# Patient Record
Sex: Male | Born: 1945
Health system: Southern US, Community
[De-identification: ages and names within clinical notes are randomized; demographics above are authoritative.]

## PROBLEM LIST (undated history)

## (undated) DIAGNOSIS — R609 Edema, unspecified: Secondary | ICD-10-CM

## (undated) DIAGNOSIS — I480 Paroxysmal atrial fibrillation: Secondary | ICD-10-CM

## (undated) DIAGNOSIS — N200 Calculus of kidney: Secondary | ICD-10-CM

## (undated) DIAGNOSIS — I10 Essential (primary) hypertension: Secondary | ICD-10-CM

## (undated) DIAGNOSIS — M109 Gout, unspecified: Secondary | ICD-10-CM

## (undated) DIAGNOSIS — N183 Chronic kidney disease, stage 3 unspecified: Secondary | ICD-10-CM

## (undated) DIAGNOSIS — Z7901 Long term (current) use of anticoagulants: Secondary | ICD-10-CM

## (undated) DIAGNOSIS — M1990 Unspecified osteoarthritis, unspecified site: Secondary | ICD-10-CM

## (undated) DIAGNOSIS — E785 Hyperlipidemia, unspecified: Secondary | ICD-10-CM

## (undated) DIAGNOSIS — E7800 Pure hypercholesterolemia, unspecified: Secondary | ICD-10-CM

## (undated) DIAGNOSIS — Z9289 Personal history of other medical treatment: Secondary | ICD-10-CM

## (undated) DIAGNOSIS — I482 Chronic atrial fibrillation, unspecified: Secondary | ICD-10-CM

## (undated) DIAGNOSIS — I872 Venous insufficiency (chronic) (peripheral): Secondary | ICD-10-CM

## (undated) DIAGNOSIS — D6869 Other thrombophilia: Secondary | ICD-10-CM

## (undated) DIAGNOSIS — E669 Obesity, unspecified: Secondary | ICD-10-CM

## (undated) HISTORY — DX: Edema, unspecified: R60.9

## (undated) HISTORY — DX: Unspecified osteoarthritis, unspecified site: M19.90

## (undated) HISTORY — DX: Personal history of other medical treatment: Z92.89

## (undated) HISTORY — DX: Essential (primary) hypertension: I10

## (undated) HISTORY — DX: Pure hypercholesterolemia, unspecified: E78.00

## (undated) HISTORY — DX: Long term (current) use of anticoagulants: Z79.01

## (undated) HISTORY — DX: Obesity, unspecified: E66.9

## (undated) HISTORY — DX: Venous insufficiency (chronic) (peripheral): I87.2

## (undated) HISTORY — DX: Paroxysmal atrial fibrillation: I48.0

## (undated) HISTORY — DX: Calculus of kidney: N20.0

## (undated) HISTORY — DX: Hyperlipidemia, unspecified: E78.5

## (undated) HISTORY — DX: Gout, unspecified: M10.9

## (undated) HISTORY — DX: Chronic kidney disease, stage 3 unspecified: N18.30

## (undated) HISTORY — PX: OTHER SURGICAL HISTORY: SHX169

## (undated) HISTORY — DX: Other thrombophilia: D68.69

## (undated) HISTORY — DX: Chronic atrial fibrillation, unspecified: I48.20

---

## 1997-10-25 ENCOUNTER — Ambulatory Visit (HOSPITAL_COMMUNITY): Admission: RE | Admit: 1997-10-25 | Discharge: 1997-10-25 | Payer: Self-pay | Admitting: Urology

## 1999-10-11 ENCOUNTER — Emergency Department (HOSPITAL_COMMUNITY): Admission: EM | Admit: 1999-10-11 | Discharge: 1999-10-11 | Payer: Self-pay | Admitting: Emergency Medicine

## 1999-10-12 ENCOUNTER — Encounter: Payer: Self-pay | Admitting: Emergency Medicine

## 1999-10-29 ENCOUNTER — Encounter: Admission: RE | Admit: 1999-10-29 | Discharge: 1999-10-29 | Payer: Self-pay | Admitting: Urology

## 1999-10-29 ENCOUNTER — Encounter: Payer: Self-pay | Admitting: Urology

## 1999-10-30 ENCOUNTER — Ambulatory Visit (HOSPITAL_COMMUNITY): Admission: RE | Admit: 1999-10-30 | Discharge: 1999-10-30 | Payer: Self-pay | Admitting: Urology

## 1999-10-30 ENCOUNTER — Encounter: Payer: Self-pay | Admitting: Urology

## 1999-11-14 ENCOUNTER — Ambulatory Visit (HOSPITAL_COMMUNITY): Admission: RE | Admit: 1999-11-14 | Discharge: 1999-11-14 | Payer: Self-pay | Admitting: Gastroenterology

## 2000-03-03 ENCOUNTER — Emergency Department (HOSPITAL_COMMUNITY): Admission: EM | Admit: 2000-03-03 | Discharge: 2000-03-03 | Payer: Self-pay | Admitting: Emergency Medicine

## 2000-03-03 ENCOUNTER — Encounter: Payer: Self-pay | Admitting: Emergency Medicine

## 2001-03-31 ENCOUNTER — Emergency Department (HOSPITAL_COMMUNITY): Admission: EM | Admit: 2001-03-31 | Discharge: 2001-03-31 | Payer: Self-pay | Admitting: Emergency Medicine

## 2004-12-22 ENCOUNTER — Emergency Department (HOSPITAL_COMMUNITY): Admission: EM | Admit: 2004-12-22 | Discharge: 2004-12-22 | Payer: Self-pay | Admitting: Emergency Medicine

## 2008-01-17 DIAGNOSIS — Z9289 Personal history of other medical treatment: Secondary | ICD-10-CM

## 2008-01-17 HISTORY — DX: Personal history of other medical treatment: Z92.89

## 2009-01-23 ENCOUNTER — Encounter (HOSPITAL_BASED_OUTPATIENT_CLINIC_OR_DEPARTMENT_OTHER): Admission: RE | Admit: 2009-01-23 | Discharge: 2009-03-26 | Payer: Self-pay | Admitting: General Surgery

## 2009-01-24 ENCOUNTER — Ambulatory Visit: Payer: Self-pay | Admitting: Vascular Surgery

## 2009-01-25 ENCOUNTER — Encounter (HOSPITAL_BASED_OUTPATIENT_CLINIC_OR_DEPARTMENT_OTHER): Payer: Self-pay | Admitting: General Surgery

## 2009-01-25 ENCOUNTER — Ambulatory Visit: Admission: RE | Admit: 2009-01-25 | Discharge: 2009-01-25 | Payer: Self-pay | Admitting: General Surgery

## 2009-03-27 ENCOUNTER — Encounter (HOSPITAL_BASED_OUTPATIENT_CLINIC_OR_DEPARTMENT_OTHER): Admission: RE | Admit: 2009-03-27 | Discharge: 2009-04-23 | Payer: Self-pay | Admitting: General Surgery

## 2009-11-16 ENCOUNTER — Ambulatory Visit: Payer: Self-pay | Admitting: Cardiology

## 2009-11-16 ENCOUNTER — Inpatient Hospital Stay (HOSPITAL_COMMUNITY): Admission: EM | Admit: 2009-11-16 | Discharge: 2009-11-18 | Payer: Self-pay | Admitting: Emergency Medicine

## 2009-11-17 ENCOUNTER — Encounter (INDEPENDENT_AMBULATORY_CARE_PROVIDER_SITE_OTHER): Payer: Self-pay | Admitting: Internal Medicine

## 2009-11-29 ENCOUNTER — Ambulatory Visit: Payer: Self-pay | Admitting: Cardiology

## 2010-03-15 ENCOUNTER — Ambulatory Visit: Payer: Self-pay | Admitting: Cardiology

## 2010-06-13 LAB — BASIC METABOLIC PANEL
BUN: 24 mg/dL — ABNORMAL HIGH (ref 6–23)
BUN: 32 mg/dL — ABNORMAL HIGH (ref 6–23)
BUN: 38 mg/dL — ABNORMAL HIGH (ref 6–23)
CO2: 22 mEq/L (ref 19–32)
Calcium: 8.6 mg/dL (ref 8.4–10.5)
Calcium: 9 mg/dL (ref 8.4–10.5)
Chloride: 107 mEq/L (ref 96–112)
Chloride: 110 mEq/L (ref 96–112)
Creatinine, Ser: 1.48 mg/dL (ref 0.4–1.5)
Creatinine, Ser: 1.55 mg/dL — ABNORMAL HIGH (ref 0.4–1.5)
Creatinine, Ser: 1.81 mg/dL — ABNORMAL HIGH (ref 0.4–1.5)
GFR calc Af Amer: 46 mL/min — ABNORMAL LOW (ref >60)
GFR calc Af Amer: 55 mL/min — ABNORMAL LOW (ref >60)
GFR calc Af Amer: 58 mL/min — ABNORMAL LOW (ref >60)
GFR calc non Af Amer: 38 mL/min — ABNORMAL LOW (ref >60)
GFR calc non Af Amer: 48 mL/min — ABNORMAL LOW (ref >60)
Glucose, Bld: 110 mg/dL — ABNORMAL HIGH (ref 70–99)
Potassium: 4.2 mEq/L (ref 3.5–5.1)
Sodium: 140 mEq/L (ref 135–145)

## 2010-06-13 LAB — D-DIMER, QUANTITATIVE: D-Dimer, Quant: 0.34 ug/mL-FEU (ref 0.00–0.48)

## 2010-06-13 LAB — POCT CARDIAC MARKERS
CKMB, poc: <1 ng/mL — ABNORMAL LOW (ref 1.0–8.0)
Myoglobin, poc: 167 ng/mL (ref 12–200)
Troponin i, poc: <0.05 ng/mL (ref 0.00–0.09)

## 2010-06-13 LAB — DIFFERENTIAL
Basophils Absolute: 0 10*3/uL (ref 0.0–0.1)
Basophils Relative: 1 % (ref 0–1)
Eosinophils Absolute: 0.1 K/uL (ref 0.0–0.7)
Eosinophils Relative: 2 % (ref 0–5)
Lymphocytes Relative: 25 % (ref 12–46)
Lymphs Abs: 2 K/uL (ref 0.7–4.0)
Monocytes Absolute: 0.9 K/uL (ref 0.1–1.0)
Monocytes Relative: 11 % (ref 3–12)
Neutro Abs: 4.9 K/uL (ref 1.7–7.7)
Neutrophils Relative %: 61 % (ref 43–77)

## 2010-06-13 LAB — CBC
HCT: 37.9 % — ABNORMAL LOW (ref 39.0–52.0)
HCT: 38 % — ABNORMAL LOW (ref 39.0–52.0)
Hemoglobin: 12.7 g/dL — ABNORMAL LOW (ref 13.0–17.0)
Hemoglobin: 12.7 g/dL — ABNORMAL LOW (ref 13.0–17.0)
MCH: 31.4 pg (ref 26.0–34.0)
MCHC: 33.4 g/dL (ref 30.0–36.0)
MCV: 93.8 fL (ref 78.0–100.0)
MCV: 95.2 fL (ref 78.0–100.0)
Platelets: 277 10*3/uL (ref 150–400)
Platelets: 290 10*3/uL (ref 150–400)
Platelets: 291 10*3/uL (ref 150–400)
RBC: 3.78 MIL/uL — ABNORMAL LOW (ref 4.22–5.81)
RBC: 3.94 MIL/uL — ABNORMAL LOW (ref 4.22–5.81)
RBC: 3.99 MIL/uL — ABNORMAL LOW (ref 4.22–5.81)
RBC: 4.05 MIL/uL — ABNORMAL LOW (ref 4.22–5.81)
RDW: 13 % (ref 11.5–15.5)
RDW: 13 % (ref 11.5–15.5)
RDW: 13.3 % (ref 11.5–15.5)
WBC: 10.5 10*3/uL (ref 4.0–10.5)
WBC: 8 10*3/uL (ref 4.0–10.5)
WBC: 8.4 10*3/uL (ref 4.0–10.5)
WBC: 9.4 10*3/uL (ref 4.0–10.5)

## 2010-06-13 LAB — LIPID PANEL
LDL Cholesterol: 96 mg/dL (ref 0–99)
Triglycerides: 106 mg/dL (ref <150)

## 2010-06-13 LAB — TROPONIN I: Troponin I: 0.01 ng/mL (ref 0.00–0.06)

## 2010-06-13 LAB — TSH: TSH: 0.415 u[IU]/mL (ref 0.350–4.500)

## 2010-06-13 LAB — PROTIME-INR
INR: 1.01 (ref 0.00–1.49)
INR: 1.05 (ref 0.00–1.49)
INR: 1.11 (ref 0.00–1.49)
Prothrombin Time: 13.5 s (ref 11.6–15.2)
Prothrombin Time: 14.5 seconds (ref 11.6–15.2)

## 2010-06-13 LAB — CK TOTAL AND CKMB (NOT AT ARMC)
CK, MB: 0.8 ng/mL (ref 0.3–4.0)
Relative Index: INVALID (ref 0.0–2.5)
Total CK: 86 U/L (ref 7–232)

## 2010-06-13 LAB — CARDIAC PANEL(CRET KIN+CKTOT+MB+TROPI)
CK, MB: 1.2 ng/mL (ref 0.3–4.0)
CK, MB: 1.6 ng/mL (ref 0.3–4.0)
Relative Index: INVALID (ref 0.0–2.5)
Total CK: 65 U/L (ref 7–232)
Troponin I: 0.01 ng/mL (ref 0.00–0.06)

## 2010-06-13 LAB — GLUCOSE, CAPILLARY: Glucose-Capillary: 94 mg/dL (ref 70–99)

## 2010-06-13 LAB — HEPARIN LEVEL (UNFRACTIONATED)
Heparin Unfractionated: 0.42 IU/mL (ref 0.30–0.70)
Heparin Unfractionated: 0.6 IU/mL (ref 0.30–0.70)

## 2010-06-13 LAB — APTT: aPTT: 29 seconds (ref 24–37)

## 2010-06-13 LAB — BRAIN NATRIURETIC PEPTIDE: Pro B Natriuretic peptide (BNP): 70 pg/mL (ref 0.0–100.0)

## 2010-06-13 LAB — MAGNESIUM
Magnesium: 2 mg/dL (ref 1.5–2.5)
Magnesium: 2.1 mg/dL (ref 1.5–2.5)

## 2010-06-26 ENCOUNTER — Encounter: Payer: Self-pay | Admitting: Cardiology

## 2010-07-30 ENCOUNTER — Other Ambulatory Visit: Payer: Self-pay | Admitting: *Deleted

## 2010-07-30 DIAGNOSIS — E7800 Pure hypercholesterolemia, unspecified: Secondary | ICD-10-CM

## 2010-08-16 NOTE — Op Note (Signed)
[facility]  Patient:    Bradley Stokes, Bradley Stokes                      MRN: [id number] Proc. Date: 10/30/99 Adm. Date:  [id number] Disc. Date: [id number] Attending:  Georgina Pillion                           Operative Report  PREOPERATIVE DIAGNOSIS:  Left ureteral calculus.  POSTOPERATIVE DIAGNOSIS:  Left ureteral calculus.  OPERATION PERFORMED:  Cystoscopy, left retrograde pyelogram, ureteroscopy, basket extraction of stone and Double J stent placement.  SURGEON:  Rana Snare, M.D.  ANESTHESIA:  General with LMA.  BRIEF HISTORY:  Mr. Hlavka is a 65 year old male.  He was diagnosed with a 5 mm distal left ureteral stone and moderate hydronephrosis.  He has failed to pass the stone spontaneously for three weeks and now presents for definitive management.  All risks and benefits of the surgery were discussed with the patient at length and full informed consent was obtained.  DESCRIPTION OF PROCEDURE:  The patient was brought to the operating room where he had the successful induction of general anesthesia.  He was placed in the lithotomy position, prepped and draped in the usual manner.  Cystoscopy revealed an unremarkable anterior urethra and prostatic urethra.  The bladder itself was endoscopically normal.  A retrograde pyelogram confirmed the presence of a filling defect in the distal left ureter.  The guidewire was placed to the renal pelvis with fluoroscopy.  A 6.5 French ureteroscope was then inserted without difficulty in the distal ureter.  There was some mucosal edema and slight narrowing of the ureter just below the stone.  A 5-6 mm stone was encountered.  It was basket extracted, but broke into two pieces.  Each piece was separately basket extracted.  Over the guidewire, a 26 cm, 6-French Double J stent was placed.  A dangle string was left on which was secured to the patients penis and lidocaine jelly was instilled.  The patient appeared to  tolerate the procedure well with no obvious complications. DD:  10/30/99 TD:  10/31/99 Job: [id number]

## 2010-08-16 NOTE — Procedures (Signed)
[facility]  Patient:    Bradley Stokes, Bradley Stokes                      MRN: [id number] Proc. Date: 11/14/99 Adm. Date:  [id number] Attending:  [name] CC:         Guinevere Ferrari, M.D. [facility]   Procedure Report  DATE OF BIRTH:  Sep 03, 1945  REFERRING PHYSICIAN:  Guinevere Ferrari, M.D. [facility].  PROCEDURE PERFORMED:  Flexible sigmoidoscopy.  ENDOSCOPIST:  Nelwyn Salisbury, M.D.  INSTRUMENT USED:  Olympus video colonoscope.  INDICATIONS FOR PROCEDURE:  Screening flexible sigmoidoscopy being done in a 64 year old white male, rule out colonic polyps, masses, hemorrhoids, etc.  PREPROCEDURE PREPARATION:  Informed consent was obtained from the patient. The patient was fasted for eight hours prior to the procedure and prepped with two Fleets enemas the morning of the procedure.  PREPROCEDURE PHYSICAL EXAMINATION:  VITAL SIGNS:  Stable.  NECK:  Supple.  CHEST:  Clear to auscultation.  S1 and S2 regular.  ABDOMEN:  Soft with normal abdominal bowel sounds.  DESCRIPTION OF PROCEDURE:  The patient was placed in the left lateral decubitus position and sedated with 50 mg of Demerol and 4 mg of Versed intravenously.  Once the patient was adequately sedated and maintained on low flow oxygen and continuous cardiac monitoring, the Olympus video colonoscope was advanced from the rectum to about 50 cm with extreme difficulty secondary to a large amount of residual stool in the colon.  The patient had extensive left-sided diverticular disease with inspissated stool in several of the diverticular pockets.  The patient had small non-bleeding internal hemorrhoids on retroflexion of the rectum.  The scope could not be advanced beyond 50 cm because of an inadequate prep.  The patient tolerated the procedure well without complication.  No masses or polyps were seen.  IMPRESSION: 1. Significant left-sided diverticulosis. 2. Small non-bleeding internal  hemorrhoid. 3. No masses or polyps seen. 4. Large amount of residual stool in the left colon.  RECOMMENDATIONS: 1. The patient has been advised to increase the fluid and fiber in his diet,    and to avoid nuts, seeds, and popcorn. 2. Follow up is advised in the office on a p.r.n. basis. DD:  11/14/99 TD:  11/14/99 Job: [id number]

## 2010-08-21 ENCOUNTER — Encounter: Payer: Self-pay | Admitting: Cardiology

## 2010-08-21 ENCOUNTER — Encounter: Payer: Self-pay | Admitting: *Deleted

## 2010-08-21 ENCOUNTER — Encounter: Payer: Self-pay | Admitting: Family Medicine

## 2010-08-21 DIAGNOSIS — I480 Paroxysmal atrial fibrillation: Secondary | ICD-10-CM | POA: Insufficient documentation

## 2010-08-21 DIAGNOSIS — E785 Hyperlipidemia, unspecified: Secondary | ICD-10-CM | POA: Insufficient documentation

## 2010-08-21 DIAGNOSIS — Z7901 Long term (current) use of anticoagulants: Secondary | ICD-10-CM | POA: Insufficient documentation

## 2010-08-21 DIAGNOSIS — I10 Essential (primary) hypertension: Secondary | ICD-10-CM | POA: Insufficient documentation

## 2010-08-21 DIAGNOSIS — R609 Edema, unspecified: Secondary | ICD-10-CM | POA: Insufficient documentation

## 2010-08-22 ENCOUNTER — Encounter: Payer: Self-pay | Admitting: Cardiology

## 2010-08-22 ENCOUNTER — Other Ambulatory Visit: Payer: Self-pay | Admitting: *Deleted

## 2010-08-22 ENCOUNTER — Ambulatory Visit (INDEPENDENT_AMBULATORY_CARE_PROVIDER_SITE_OTHER): Payer: Medicare HMO | Admitting: Cardiology

## 2010-08-22 ENCOUNTER — Other Ambulatory Visit (INDEPENDENT_AMBULATORY_CARE_PROVIDER_SITE_OTHER): Payer: Medicare HMO | Admitting: *Deleted

## 2010-08-22 VITALS — BP 140/80 | HR 57 | Wt 281.0 lb

## 2010-08-22 DIAGNOSIS — Z79899 Other long term (current) drug therapy: Secondary | ICD-10-CM

## 2010-08-22 DIAGNOSIS — R609 Edema, unspecified: Secondary | ICD-10-CM

## 2010-08-22 DIAGNOSIS — E7800 Pure hypercholesterolemia, unspecified: Secondary | ICD-10-CM

## 2010-08-22 DIAGNOSIS — I4891 Unspecified atrial fibrillation: Secondary | ICD-10-CM

## 2010-08-22 DIAGNOSIS — I480 Paroxysmal atrial fibrillation: Secondary | ICD-10-CM

## 2010-08-22 LAB — CBC WITH DIFFERENTIAL/PLATELET
Eosinophils Absolute: 0.1 10*3/uL (ref 0.0–0.7)
Eosinophils Relative: 1.6 % (ref 0.0–5.0)
Lymphocytes Relative: 22.2 % (ref 12.0–46.0)
MCV: 95.1 fl (ref 78.0–100.0)
Monocytes Absolute: 0.8 10*3/uL (ref 0.1–1.0)
Neutrophils Relative %: 66 % (ref 43.0–77.0)
Platelets: 251 10*3/uL (ref 150.0–400.0)
WBC: 8.1 10*3/uL (ref 4.5–10.5)

## 2010-08-22 LAB — HEPATIC FUNCTION PANEL
ALT: 8 U/L (ref 0–53)
AST: 15 U/L (ref 0–37)
Total Bilirubin: 1.3 mg/dL — ABNORMAL HIGH (ref 0.3–1.2)
Total Protein: 7 g/dL (ref 6.0–8.3)

## 2010-08-22 LAB — BASIC METABOLIC PANEL
BUN: 27 mg/dL — ABNORMAL HIGH (ref 6–23)
CO2: 33 mEq/L — ABNORMAL HIGH (ref 19–32)
Chloride: 100 mEq/L (ref 96–112)
Glucose, Bld: 94 mg/dL (ref 70–99)
Potassium: 4.3 mEq/L (ref 3.5–5.1)

## 2010-08-22 LAB — TSH: TSH: 0.5 u[IU]/mL (ref 0.35–5.50)

## 2010-08-22 MED ORDER — FUROSEMIDE 20 MG PO TABS: 40.0000 mg | ORAL_TABLET | Freq: Two times a day (BID) | ORAL | Status: DC

## 2010-08-22 NOTE — Assessment & Plan Note (Signed)
The patient has a history of chronic bilateral edema.  The edema has been minimized by wearing support bruits.  He has not been having any calf tenderness or evidence of phlebitis.

## 2010-08-22 NOTE — Assessment & Plan Note (Signed)
The patient has a past history of paroxysmal atrial fibrillation.  He is on low dose amiodarone.  He has been maintaining normal sinus rhythm.  He has not been aware of any palpitations or tachycardia.  He has not been experiencing any change in exercise tolerance.  He denies any recent chest pain or increased shortness of breath.  He does have chronic edema and wears support hose.

## 2010-08-22 NOTE — Progress Notes (Signed)
Bradley Stokes Date of Birth:  [birth year] [facility]: [name] / [facility] [id number] [address]. [address]   [place], [place]  [address] [phone]           Fax   [phone]  History of Present Illness: This pleasant 65 year old gentleman is seen for a four-month followup office visit.  He has a history of paroxysmal atrial fibrillation and a history of essential hypertension.  He does not have any history of ischemic heart disease.  He had a normal two-day adenosine Cardiolite stress test 01/17/08.  He was last hospitalized at [facility] on 11/16/09.  On that admission he converted on medication and did not require electrical cardioversion.  Current Outpatient Prescriptions  Medication Sig Dispense Refill  . amiodarone (PACERONE) 100 MG tablet Take 100 mg by mouth daily.        Marland Kitchen aspirin 81 MG tablet Take 81 mg by mouth daily.        . furosemide (LASIX) 20 MG tablet Take 2 tablets (40 mg total) by mouth 2 (two) times daily.  120 tablet  12  . lisinopril (PRINIVIL,ZESTRIL) 10 MG tablet Take 20 mg by mouth daily.       . metoprolol tartrate (LOPRESSOR) 25 MG tablet Take 12.5 mg by mouth 2 (two) times daily.        Marland Kitchen warfarin (COUMADIN) 5 MG tablet Take 5 mg by mouth as directed. Followed by Dr.Rankin      . DISCONTD: furosemide (LASIX) 20 MG tablet Take 40 mg by mouth 2 (two) times daily.         No Known Allergies  Patient Active Problem List  Diagnoses  . PAF (paroxysmal atrial fibrillation)  . Edema  . Long-term (current) use of anticoagulants  . Hyperlipidemia  . Hypertension    History  Smoking status  . Never Smoker   Smokeless tobacco  . Never Used    History  Alcohol Use No    Family History  Problem Relation Age of Onset  . Pneumonia Mother   . Heart failure Mother   . Kidney failure Sister   . Heart disease Brother   . Crohn's disease Sister     Review of Systems: Constitutional: no fever chills diaphoresis or fatigue or change in weight.    Head and neck: no hearing loss, no epistaxis, no photophobia or visual disturbance. Respiratory: No cough, shortness of breath or wheezing. Cardiovascular: No chest pain Or palpitations. Gastrointestinal: No abdominal distention, no abdominal pain, no change in bowel habits hematochezia or melena. Genitourinary: No dysuria, no frequency, no urgency, no nocturia. Musculoskeletal:No arthralgias, no back pain, no gait disturbance or myalgias. Neurological: No dizziness, no headaches, no numbness, no seizures, no syncope, no weakness, no tremors. Hematologic: No lymphadenopathy, no easy bruising. Psychiatric: No confusion, no hallucinations, no sleep disturbance.    Physical Exam: Filed Vitals:   08/22/10 1114  BP: 140/80  Pulse: 57  The general appearance is a well-developed large gentleman who is in no distress.  His weight has increased 14 pounds since we last saw him.  Pupils equal and reactive.   Extraocular Movements are full.  There is no scleral icterus.  The mouth and pharynx are normal.  The neck is supple.  The carotids reveal no bruits.  The jugular venous pressure is normal.  The thyroid is not enlarged.  There is no lymphadenopathy.The chest is clear to percussion and auscultation. There are no rales or rhonchi. Expansion of the chest is symmetrical.The  precordium is quiet.  The first heart sound is normal.  The second heart sound is physiologically split.  There is no murmur gallop rub or click.  There is no abnormal lift or heave.The abdomen is soft and nontender. Bowel sounds are normal. The liver and spleen are not enlarged. There Are no abdominal masses. There are no bruits.  Extremities reveal mild bilateral edema and he is wearing support hose.Strength is normal and symmetrical in all extremities.  There is no lateralizing weakness.  There are no sensory deficits.  Musculoskeletal reveals that he has a lot of arthritis and walks with a cane.  EKG today shows sinus bradycardia  and no ischemic changes.  Intervals are normal.   Assessment / Plan:  The patient is to continue same medication.  He needs to work harder on diet and weight loss.  Recheck in 6 months for followup office visit and EKG.

## 2010-08-23 ENCOUNTER — Telehealth: Payer: Self-pay | Admitting: *Deleted

## 2010-08-23 NOTE — Telephone Encounter (Signed)
Adv. Patient of lab results

## 2010-09-23 ENCOUNTER — Other Ambulatory Visit: Payer: Self-pay | Admitting: Cardiology

## 2010-09-23 DIAGNOSIS — I4891 Unspecified atrial fibrillation: Secondary | ICD-10-CM

## 2010-09-24 MED ORDER — AMIODARONE HCL 200 MG PO TABS: ORAL_TABLET | ORAL | Status: DC

## 2010-09-24 NOTE — Telephone Encounter (Signed)
Spoke with patient and he has been taking amiodarone 200 mg 1/2 daily for quite a while, what paper chart had indicated.  He wanted to continue taking 1/2 daily instead of decreasing pill size to 100 mg for now.

## 2010-10-09 ENCOUNTER — Emergency Department (HOSPITAL_COMMUNITY)
Admission: EM | Admit: 2010-10-09 | Discharge: 2010-10-09 | Disposition: A | Discharge status: Home / Self Care | Payer: Medicare HMO | Attending: Emergency Medicine | Admitting: Emergency Medicine

## 2010-10-09 DIAGNOSIS — I10 Essential (primary) hypertension: Secondary | ICD-10-CM | POA: Insufficient documentation

## 2010-10-09 DIAGNOSIS — K625 Hemorrhage of anus and rectum: Secondary | ICD-10-CM | POA: Insufficient documentation

## 2010-10-09 DIAGNOSIS — Z87442 Personal history of urinary calculi: Secondary | ICD-10-CM | POA: Insufficient documentation

## 2010-10-09 DIAGNOSIS — Z7901 Long term (current) use of anticoagulants: Secondary | ICD-10-CM | POA: Insufficient documentation

## 2010-10-09 DIAGNOSIS — I4891 Unspecified atrial fibrillation: Secondary | ICD-10-CM | POA: Insufficient documentation

## 2010-10-09 LAB — BASIC METABOLIC PANEL
GFR calc Af Amer: 51 mL/min — ABNORMAL LOW (ref >60)
GFR calc non Af Amer: 42 mL/min — ABNORMAL LOW (ref >60)
Potassium: 4.1 mEq/L (ref 3.5–5.1)
Sodium: 142 mEq/L (ref 135–145)

## 2010-10-09 LAB — CBC
MCHC: 32 g/dL (ref 30.0–36.0)
Platelets: 254 10*3/uL (ref 150–400)
RDW: 13.1 % (ref 11.5–15.5)

## 2010-10-09 LAB — OCCULT BLOOD, POC DEVICE: Fecal Occult Bld: POSITIVE

## 2010-10-09 LAB — PROTIME-INR: INR: 2.11 — ABNORMAL HIGH (ref 0.00–1.49)

## 2010-11-20 ENCOUNTER — Other Ambulatory Visit: Payer: Self-pay | Admitting: Cardiology

## 2010-11-20 DIAGNOSIS — I119 Hypertensive heart disease without heart failure: Secondary | ICD-10-CM

## 2010-11-20 NOTE — Telephone Encounter (Signed)
escribe request  

## 2010-12-20 ENCOUNTER — Other Ambulatory Visit: Payer: Self-pay | Admitting: Cardiology

## 2010-12-20 DIAGNOSIS — I4891 Unspecified atrial fibrillation: Secondary | ICD-10-CM

## 2010-12-23 NOTE — Telephone Encounter (Signed)
escribe request  

## 2011-03-06 ENCOUNTER — Encounter: Payer: Self-pay | Admitting: Cardiology

## 2011-03-06 ENCOUNTER — Ambulatory Visit (INDEPENDENT_AMBULATORY_CARE_PROVIDER_SITE_OTHER): Payer: Medicare HMO | Admitting: Cardiology

## 2011-03-06 VITALS — BP 148/80 | HR 64 | Ht 69.0 in | Wt 291.0 lb

## 2011-03-06 DIAGNOSIS — I480 Paroxysmal atrial fibrillation: Secondary | ICD-10-CM

## 2011-03-06 DIAGNOSIS — I10 Essential (primary) hypertension: Secondary | ICD-10-CM

## 2011-03-06 DIAGNOSIS — R609 Edema, unspecified: Secondary | ICD-10-CM

## 2011-03-06 DIAGNOSIS — I119 Hypertensive heart disease without heart failure: Secondary | ICD-10-CM

## 2011-03-06 DIAGNOSIS — E669 Obesity, unspecified: Secondary | ICD-10-CM

## 2011-03-06 DIAGNOSIS — I4891 Unspecified atrial fibrillation: Secondary | ICD-10-CM

## 2011-03-06 DIAGNOSIS — E7800 Pure hypercholesterolemia, unspecified: Secondary | ICD-10-CM

## 2011-03-06 NOTE — Assessment & Plan Note (Addendum)
The patient has chronic peripheral edema which is still present despite being on diuretic.  His weight is up 10 pounds since last visit

## 2011-03-06 NOTE — Progress Notes (Signed)
Bradley Stokes Date of Birth:  [birth year] [facility] / [facility] [id number] [address]   [place], [place]  [address] [phone]           Fax   [phone]  History of Present Illness: This pleasant 65 year old gentleman is seen for a month followup office visit.  He has a history of paroxysmal atrial fibrillation and a history of essential hypertension.  He does not have any history of ischemic heart disease.  He had a normal 2 day adenosine Cardiolite stress test in October 2009.  He was hospitalized for rapid atrial fibrillation on 11/16/09 and that admission he converted medically and did not require electrical cardioversion.  He was placed on low-dose amiodarone which he has tolerated well.  Current Outpatient Prescriptions  Medication Sig Dispense Refill  . amiodarone (PACERONE) 200 MG tablet 1/2 daily  30 tablet  11  . aspirin 81 MG tablet Take 81 mg by mouth daily.        . furosemide (LASIX) 20 MG tablet Take 2 tablets (40 mg total) by mouth 2 (two) times daily.  120 tablet  12  . lisinopril (PRINIVIL,ZESTRIL) 10 MG tablet Take 20 mg by mouth daily.       . metoprolol tartrate (LOPRESSOR) 25 MG tablet TAKE 1/2 TABLET BY MOUTH TWICE DAILY  30 tablet  11  . warfarin (COUMADIN) 5 MG tablet Take 5 mg by mouth as directed. Followed by Dr.Rankin      . atorvastatin (LIPITOR) 20 MG tablet Take 20 mg by mouth Daily.        No Known Allergies  Patient Active Problem List  Diagnoses  . PAF (paroxysmal atrial fibrillation)  . Edema  . Long-term (current) use of anticoagulants  . Hyperlipidemia  . Hypertension    History  Smoking status  . Never Smoker   Smokeless tobacco  . Never Used    History  Alcohol Use No    Family History  Problem Relation Age of Onset  . Pneumonia Mother   . Heart failure Mother   . Kidney failure Sister   . Heart disease Brother   . Crohn's disease Sister     Review of Systems: Constitutional: no fever chills  diaphoresis or fatigue or change in weight.  Head and neck: no hearing loss, no epistaxis, no photophobia or visual disturbance. Respiratory: No cough, shortness of breath or wheezing. Cardiovascular: No chest pain peripheral edema, palpitations. Gastrointestinal: No abdominal distention, no abdominal pain, no change in bowel habits hematochezia or melena. Genitourinary: No dysuria, no frequency, no urgency, no nocturia. Musculoskeletal:No arthralgias, no back pain, no gait disturbance or myalgias. Neurological: No dizziness, no headaches, no numbness, no seizures, no syncope, no weakness, no tremors. Hematologic: No lymphadenopathy, no easy bruising. Psychiatric: No confusion, no hallucinations, no sleep disturbance.    Physical Exam: Filed Vitals:   03/06/11 1441  BP: 148/80  Pulse: 64   the general appearance reveals a large gentleman in no acute distress.Pupils equal and reactive.   Extraocular Movements are full.  There is no scleral icterus.  The mouth and pharynx are normal.  The neck is supple.  The carotids reveal no bruits.  The jugular venous pressure is normal.  The thyroid is not enlarged.  There is no lymphadenopathy.  The chest is clear to percussion and auscultation. There are no rales or rhonchi. Expansion of the chest is symmetrical.  The precordium is quiet.  The first heart sound is normal.  The  second heart sound is physiologically split.  There is no murmur gallop rub or click.  There is no abnormal lift or heave.  The abdomen is soft and nontender. Bowel sounds are normal. The liver and spleen are not enlarged. There Are no abdominal masses. There are no bruits.  GEN Ms. reveal 2+ pretibial edema bilaterally.Strength is normal and symmetrical in all extremities.  There is no lateralizing weakness.  There are no sensory deficits.  The skin is warm and dry.  There is no rash.    Assessment / Plan:  Continue same medication.  Work harder at weight loss.  Recheck  in 6 months for office visit and blood work to followup on amiodarone.

## 2011-03-06 NOTE — Assessment & Plan Note (Signed)
The patient is not having any headaches or dizzy spells.  Is not having any symptoms of congestive heart failure.  He has not been as careful with his diet and his weight is up 10 pounds since we last saw him.

## 2011-03-06 NOTE — Assessment & Plan Note (Signed)
Patient denies any recurrent episodes of tachycardia palpitations or atrial fibrillation since his last hospitalization.  He is on long-term Coumadin which was monitored by Dr. Zadie Rhine at [facility]

## 2011-03-06 NOTE — Patient Instructions (Signed)
Your physician recommends that you continue on your current medications as directed. Please refer to the Current Medication list given to you today.  Your physician wants you to follow-up in: 6 months. You will receive a reminder letter in the mail two months in advance. If you don't receive a letter, please call our office to schedule the follow-up appointment.  

## 2011-08-14 ENCOUNTER — Other Ambulatory Visit: Payer: Self-pay | Admitting: Cardiology

## 2011-09-05 ENCOUNTER — Encounter: Payer: Self-pay | Admitting: Cardiology

## 2011-09-05 ENCOUNTER — Ambulatory Visit (INDEPENDENT_AMBULATORY_CARE_PROVIDER_SITE_OTHER): Payer: Medicare HMO | Admitting: Cardiology

## 2011-09-05 VITALS — BP 124/82 | HR 60 | Ht 69.0 in | Wt 296.0 lb

## 2011-09-05 DIAGNOSIS — R609 Edema, unspecified: Secondary | ICD-10-CM

## 2011-09-05 DIAGNOSIS — E7800 Pure hypercholesterolemia, unspecified: Secondary | ICD-10-CM

## 2011-09-05 DIAGNOSIS — I10 Essential (primary) hypertension: Secondary | ICD-10-CM

## 2011-09-05 DIAGNOSIS — I480 Paroxysmal atrial fibrillation: Secondary | ICD-10-CM

## 2011-09-05 DIAGNOSIS — I4891 Unspecified atrial fibrillation: Secondary | ICD-10-CM

## 2011-09-05 DIAGNOSIS — Z79899 Other long term (current) drug therapy: Secondary | ICD-10-CM

## 2011-09-05 DIAGNOSIS — Z7901 Long term (current) use of anticoagulants: Secondary | ICD-10-CM

## 2011-09-05 DIAGNOSIS — I119 Hypertensive heart disease without heart failure: Secondary | ICD-10-CM

## 2011-09-05 LAB — TSH: TSH: 0.57 u[IU]/mL (ref 0.35–5.50)

## 2011-09-05 LAB — CBC WITH DIFFERENTIAL/PLATELET
Basophils Absolute: 0 10*3/uL (ref 0.0–0.1)
Lymphocytes Relative: 16.2 % (ref 12.0–46.0)
Monocytes Relative: 8.7 % (ref 3.0–12.0)
Neutrophils Relative %: 72.7 % (ref 43.0–77.0)
Platelets: 262 10*3/uL (ref 150.0–400.0)
RDW: 14.3 % (ref 11.5–14.6)

## 2011-09-05 LAB — HEPATIC FUNCTION PANEL
ALT: 6 U/L (ref 0–53)
AST: 14 U/L (ref 0–37)
Albumin: 3.7 g/dL (ref 3.5–5.2)
Alkaline Phosphatase: 114 U/L (ref 39–117)
Total Protein: 7.3 g/dL (ref 6.0–8.3)

## 2011-09-05 NOTE — Assessment & Plan Note (Signed)
The patient is on long-term Coumadin and his protimes are followed at [facility]

## 2011-09-05 NOTE — Assessment & Plan Note (Signed)
His lower extremity edema has worsened.  He now has an area where the serum is bruising from cracks in the skin on his left lower leg.  He has an appointment to be seen at the wound center.

## 2011-09-05 NOTE — Assessment & Plan Note (Signed)
Patient has had no recurrent atrial fibrillation.  He is on amiodarone 100 mg daily.  We are checking blood work today to followup on his chronic amiodarone including a CBC, thyroid function panel and hepatic function panel.

## 2011-09-05 NOTE — Assessment & Plan Note (Signed)
The patient is not having any symptoms of increasing dyspnea.  No angina pectoris.  No dizziness or syncope.  His edema has been worse but this may be due at least in part to local factors of venous insufficiency

## 2011-09-05 NOTE — Patient Instructions (Signed)
Will obtain labs today and call you with the results  Your physician recommends that you continue on your current medications as directed. Please refer to the Current Medication list given to you today.  Your physician wants you to follow-up in: 6 months You will receive a reminder letter in the mail two months in advance. If you don't receive a letter, please call our office to schedule the follow-up appointment.   

## 2011-09-05 NOTE — Progress Notes (Signed)
Bradley Stokes Date of Birth:  12/06/1945 [facility] [address] [place],   [address] [phone]         Fax   [phone]  History of Present Illness: This pleasant 66 year old gentleman is seen for a scheduled followup office visit.  He has a past history of paroxysmal relation.  He is remaining in normal sinus rhythm on low-dose amiodarone.  He has a history of essential hypertension and a recent history of gout and is now on allopurinol.  He's been having worsening peripheral edema despite increased diuretic therapy and his primary care provider Dr. Zadie Rhine has arranged for him to be seen at the wound center Current Outpatient Prescriptions  Medication Sig Dispense Refill  . acetaminophen (TYLENOL) 325 MG tablet Take 325 mg by mouth every 6 (six) hours as needed.      Marland Kitchen allopurinol (ZYLOPRIM) 100 MG tablet Take 100 mg by mouth daily.      Marland Kitchen amiodarone (PACERONE) 200 MG tablet 1/2 daily  30 tablet  11  . aspirin 81 MG tablet Take 81 mg by mouth daily.        Marland Kitchen atorvastatin (LIPITOR) 20 MG tablet Take 20 mg by mouth Daily.      . cephALEXin (KEFLEX) 500 MG capsule as directed.      . furosemide (LASIX) 20 MG tablet TAKE 2 TABLETS BY MOUTH TWICE DAILY  120 tablet  5  . lisinopril (PRINIVIL,ZESTRIL) 10 MG tablet Take 20 mg by mouth daily.       . metoprolol tartrate (LOPRESSOR) 25 MG tablet TAKE 1/2 TABLET BY MOUTH TWICE DAILY  30 tablet  11  . warfarin (COUMADIN) 5 MG tablet Take 5 mg by mouth as directed. Followed by Dr.Rankin        Allergies  Allergen Reactions  . Other     Tape,needs paper tape    Patient Active Problem List  Diagnoses  . PAF (paroxysmal atrial fibrillation)  . Edema  . Long-term (current) use of anticoagulants  . Hyperlipidemia  . Hypertension  . Long term use of drug    History  Smoking status  . Never Smoker   Smokeless tobacco  . Never Used    History  Alcohol Use No    Family History  Problem Relation  Age of Onset  . Pneumonia Mother   . Heart failure Mother   . Kidney failure Sister   . Heart disease Brother   . Crohn's disease Sister     Review of Systems: Constitutional: no fever chills diaphoresis or fatigue or change in weight.  Head and neck: no hearing loss, no epistaxis, no photophobia or visual disturbance. Respiratory: No cough, shortness of breath or wheezing. Cardiovascular: No chest pain peripheral edema, palpitations. Gastrointestinal: No abdominal distention, no abdominal pain, no change in bowel habits hematochezia or melena. Genitourinary: No dysuria, no frequency, no urgency, no nocturia. Musculoskeletal:No arthralgias, no back pain, no gait disturbance or myalgias. Neurological: No dizziness, no headaches, no numbness, no seizures, no syncope, no weakness, no tremors. Hematologic: No lymphadenopathy, no easy bruising. Psychiatric: No confusion, no hallucinations, no sleep disturbance.    Physical Exam: Filed Vitals:   09/05/11 0918  BP: 124/82  Pulse: 60   the general appearance reveals a large gentleman in no distress.The head and neck exam reveals pupils equal and reactive.  Extraocular movements are full.  There is no scleral icterus.  The mouth and pharynx are normal.  The neck is supple.  The  carotids reveal no bruits.  The jugular venous pressure is normal.  The  thyroid is not enlarged.  There is no lymphadenopathy.  The chest is clear to percussion and auscultation.  There are no rales or rhonchi.  Expansion of the chest is symmetrical.  The precordium is quiet.  The first heart sound is normal.  The second heart sound is physiologically split.  There is no murmur gallop rub or click.  There is no abnormal lift or heave.  The abdomen is soft and nontender.  The bowel sounds are normal.  The liver and spleen are not enlarged.  There are no abdominal masses.  There are no abdominal bruits.  Extremities reveal good pedal pulses.  There is marked bilateral edema  with bruising of serum from the left lower leg.  There is no cyanosis or clubbing.  Strength is normal and symmetrical in all extremities.  There is no lateralizing weakness.  There are no sensory deficits.  The skin is warm and dry.      Assessment / Plan: The patient is to continue same cardiac medications.  Blood work is pending today.  We will plan to see him in 6 months for office visit and EKG

## 2011-09-07 NOTE — Progress Notes (Signed)
Quick Note:  Please report to patient. The recent labs are stable. Continue same medication and careful diet. Ok to continue same low dose amiodarone. ______

## 2011-09-09 ENCOUNTER — Telehealth: Payer: Self-pay | Admitting: *Deleted

## 2011-09-09 NOTE — Telephone Encounter (Signed)
Mailed copy of labs and left message to call if any questions  

## 2011-09-09 NOTE — Telephone Encounter (Signed)
Message copied by Earvin Hansen on Tue Sep 09, 2011  1:23 PM ------      Message from: Darlin Coco      Created: Sun Sep 07, 2011  8:34 AM       Please report to patient.  The recent labs are stable. Continue same medication and careful diet. Ok to continue same low dose amiodarone.

## 2011-09-16 ENCOUNTER — Encounter (HOSPITAL_BASED_OUTPATIENT_CLINIC_OR_DEPARTMENT_OTHER): Payer: Medicare HMO | Attending: General Surgery

## 2011-09-16 DIAGNOSIS — Z85828 Personal history of other malignant neoplasm of skin: Secondary | ICD-10-CM | POA: Insufficient documentation

## 2011-09-16 DIAGNOSIS — I4891 Unspecified atrial fibrillation: Secondary | ICD-10-CM | POA: Insufficient documentation

## 2011-09-16 DIAGNOSIS — Z7901 Long term (current) use of anticoagulants: Secondary | ICD-10-CM | POA: Insufficient documentation

## 2011-09-16 DIAGNOSIS — Z79899 Other long term (current) drug therapy: Secondary | ICD-10-CM | POA: Insufficient documentation

## 2011-09-16 DIAGNOSIS — L97809 Non-pressure chronic ulcer of other part of unspecified lower leg with unspecified severity: Secondary | ICD-10-CM | POA: Insufficient documentation

## 2011-09-16 DIAGNOSIS — I10 Essential (primary) hypertension: Secondary | ICD-10-CM | POA: Insufficient documentation

## 2011-09-16 DIAGNOSIS — I872 Venous insufficiency (chronic) (peripheral): Secondary | ICD-10-CM | POA: Insufficient documentation

## 2011-09-16 NOTE — H&P (Signed)
Bradley Stokes, Bradley Stokes               ACCOUNT NO.:  [id number]  MEDICAL RECORD NO.:  [id number]  LOCATION:  FOOT                         FACILITY:  [facility]  PHYSICIAN:  Elesa Hacker, M.D.        DATE OF BIRTH:  December 01, 1945  DATE OF ADMISSION:  09/16/2011 DATE OF DISCHARGE:                             HISTORY & PHYSICAL   CHIEF COMPLAINT:  Drainage left leg.  HISTORY OF PRESENT ILLNESS:  This is a 66 year old male with a history of atrial fibrillation and chronic venous insufficiency with previous stasis ulcers on the other leg.  He also has a history of hypertension, possible heart attack, basal cell carcinoma.  PAST SURGICAL HISTORY:  Includes lithotripsy.  Cigarettes and alcohol none.  MEDICATIONS:  Halcion, amiodarone, lisinopril, allopurinol, Coumadin, atorvastatin.  ALLERGIES:  None.  REVIEW OF SYSTEMS:  As above.  PHYSICAL EXAMINATION:  VITAL SIGNS: Temperature 98.1, pulse 60 and regular, respirations 18, blood pressure 150/85. GENERAL APPEARANCE: Well developed, quite obese, in no distress. CRANIUM: Normocephalic. CHEST: Clear. Heart: Regular rhythm. ABDOMEN: Not examined. EXTREMITIES: Examination of the left lower extremity reveals severe stasis changes with a great deal of redness and some blanching.  There is mild tenderness.  There are no open wounds.  Good peripheral pulses and skin nutrition of the foot.  IMPRESSION:  Chronic venous insufficiency with a recent ulcer, probably now healed, possibly cellulitis.  PLAN:  He has been on antibiotics for 10 days, but I will continue his Keflex for another 10 days.  We will treat the leg with zinc and Unna boot.  We will see in 7 days.     Elesa Hacker, M.D.     RA/MEDQ  D:  09/16/2011  T:  09/16/2011  Job:  [id number]

## 2011-09-29 ENCOUNTER — Other Ambulatory Visit: Payer: Self-pay | Admitting: Cardiology

## 2011-09-30 ENCOUNTER — Encounter (HOSPITAL_BASED_OUTPATIENT_CLINIC_OR_DEPARTMENT_OTHER): Payer: Medicare HMO | Attending: General Surgery

## 2011-09-30 DIAGNOSIS — L97809 Non-pressure chronic ulcer of other part of unspecified lower leg with unspecified severity: Secondary | ICD-10-CM | POA: Insufficient documentation

## 2011-10-14 ENCOUNTER — Encounter (HOSPITAL_BASED_OUTPATIENT_CLINIC_OR_DEPARTMENT_OTHER): Payer: Medicare HMO

## 2011-12-18 ENCOUNTER — Other Ambulatory Visit: Payer: Self-pay | Admitting: Cardiology

## 2011-12-18 NOTE — Telephone Encounter (Signed)
Refilled metoprolol 

## 2012-02-16 ENCOUNTER — Other Ambulatory Visit: Payer: Self-pay | Admitting: *Deleted

## 2012-02-16 MED ORDER — FUROSEMIDE 20 MG PO TABS: 20.0000 mg | ORAL_TABLET | Freq: Two times a day (BID) | ORAL | Status: DC

## 2012-03-16 ENCOUNTER — Ambulatory Visit (INDEPENDENT_AMBULATORY_CARE_PROVIDER_SITE_OTHER): Payer: Medicare HMO | Admitting: Cardiology

## 2012-03-16 ENCOUNTER — Encounter: Payer: Self-pay | Admitting: Cardiology

## 2012-03-16 ENCOUNTER — Ambulatory Visit (INDEPENDENT_AMBULATORY_CARE_PROVIDER_SITE_OTHER)
Admission: RE | Admit: 2012-03-16 | Discharge: 2012-03-16 | Disposition: A | Discharge status: Home / Self Care | Payer: Medicare HMO | Source: Ambulatory Visit | Attending: Cardiology | Admitting: Cardiology

## 2012-03-16 VITALS — BP 140/74 | HR 61 | Ht 69.0 in | Wt 303.0 lb

## 2012-03-16 DIAGNOSIS — I10 Essential (primary) hypertension: Secondary | ICD-10-CM

## 2012-03-16 DIAGNOSIS — I480 Paroxysmal atrial fibrillation: Secondary | ICD-10-CM

## 2012-03-16 DIAGNOSIS — Z79899 Other long term (current) drug therapy: Secondary | ICD-10-CM

## 2012-03-16 DIAGNOSIS — R609 Edema, unspecified: Secondary | ICD-10-CM

## 2012-03-16 DIAGNOSIS — I4891 Unspecified atrial fibrillation: Secondary | ICD-10-CM

## 2012-03-16 NOTE — Assessment & Plan Note (Signed)
No headaches or dizzy spells.

## 2012-03-16 NOTE — Assessment & Plan Note (Signed)
Patient has not been aware of any racing of his heart or recurrent atrial fibrillation.  Denies any chest pain or increased shortness of breath.  He still has peripheral edema which goes down partially at night.  He has not had any TIA symptoms.  He is on long-term Coumadin managed through his PCP Dr. Zadie Rhine

## 2012-03-16 NOTE — Progress Notes (Signed)
Bradley Stokes Date of Birth:  10/22/1945 [facility] [address] [place], [place]  [address] [phone]         Fax   [phone]  History of Present Illness: This pleasant 66 year old gentleman is seen for a scheduled followup office visit. He has a past history of paroxysmal fibrillation .He is remaining in normal sinus rhythm on low-dose amiodarone. He has a history of essential hypertension and a recent history of gout and is now on allopurinol.  He has had a problem in the past with exogenous obesity.   Current Outpatient Prescriptions  Medication Sig Dispense Refill  . acetaminophen (TYLENOL) 325 MG tablet Take 325 mg by mouth every 6 (six) hours as needed.      Marland Kitchen allopurinol (ZYLOPRIM) 100 MG tablet Take 100 mg by mouth daily.      Marland Kitchen amiodarone (PACERONE) 200 MG tablet TAKE 1/2 TABLET BY MOUTH DAILY  30 tablet  3  . aspirin 81 MG tablet Take 81 mg by mouth daily.        Marland Kitchen atorvastatin (LIPITOR) 20 MG tablet Take 20 mg by mouth Daily.      . furosemide (LASIX) 20 MG tablet Take 40 mg by mouth 2 (two) times daily.      Marland Kitchen lisinopril (PRINIVIL,ZESTRIL) 10 MG tablet Take 20 mg by mouth daily.       . metoprolol tartrate (LOPRESSOR) 25 MG tablet TAKE 1/2 TABLET BY MOUTH TWICE DAILY  30 tablet  11  . warfarin (COUMADIN) 5 MG tablet Take 5 mg by mouth as directed. Followed by Dr.Rankin        Allergies  Allergen Reactions  . Other     Tape,needs paper tape    Patient Active Problem List  Diagnosis  . PAF (paroxysmal atrial fibrillation)  . Edema  . Long-term (current) use of anticoagulants  . Hyperlipidemia  . Hypertension  . Long term use of drug    History  Smoking status  . Never Smoker   Smokeless tobacco  . Never Used    History  Alcohol Use No    Family History  Problem Relation Age of Onset  . Pneumonia Mother   . Heart failure Mother   . Kidney failure Sister   . Heart disease Brother   . Crohn's disease Sister      Review of Systems: Constitutional: no fever chills diaphoresis or fatigue or change in weight.  Head and neck: no hearing loss, no epistaxis, no photophobia or visual disturbance. Respiratory: No cough, shortness of breath or wheezing. Cardiovascular: No chest pain peripheral edema, palpitations. Gastrointestinal: No abdominal distention, no abdominal pain, no change in bowel habits hematochezia or melena. Genitourinary: No dysuria, no frequency, no urgency, no nocturia. Musculoskeletal:No arthralgias, no back pain, no gait disturbance or myalgias. Neurological: No dizziness, no headaches, no numbness, no seizures, no syncope, no weakness, no tremors. Hematologic: No lymphadenopathy, no easy bruising. Psychiatric: No confusion, no hallucinations, no sleep disturbance.    Physical Exam: Filed Vitals:   03/16/12 1140  BP: 140/74  Pulse: 61   the general appearance reveals a large gentleman in no acute distress.The head and neck exam reveals pupils equal and reactive.  Extraocular movements are full.  There is no scleral icterus.  The mouth and pharynx are normal.  The neck is supple.  The carotids reveal no bruits.  The jugular venous pressure is normal.  The  thyroid is not enlarged.  There is no lymphadenopathy.  The  chest is clear to percussion and auscultation.  There are no rales or rhonchi.  Expansion of the chest is symmetrical.  The precordium is quiet.  The first heart sound is normal.  The second heart sound is physiologically split.  There is no murmur gallop rub or click.  There is no abnormal lift or heave.  The abdomen is soft and nontender.  The bowel sounds are normal.  The liver and spleen are not enlarged.  There are no abdominal masses.  There are no abdominal bruits.  Extremities reveal good pedal pulses.  There is 2+ pretibial edema and is wearing support hose.  There is no cyanosis or clubbing.  Strength is normal and symmetrical in all extremities.  There is no  lateralizing weakness.  There are no sensory deficits.  The skin is warm and dry.  There is no rash.   EKG today confirms normal sinus rhythm at 60 per minute and a normal QTc interval  Assessment / Plan: Continue same medication.  Recheck in 6 months for followup office visit hepatic function panel TSH and CBC.  We will get a chest x-ray today as part of surveillance for long-term amiodarone.

## 2012-03-16 NOTE — Patient Instructions (Signed)
Will have you go for a chest xray today  Your physician wants you to follow-up in: 6 months with labsYou will receive a reminder letter in the mail two months in advance. If you don't receive a letter, please call our office to schedule the follow-up appointment.  Your physician recommends that you continue on your current medications as directed. Please refer to the Current Medication list given to you today.

## 2012-03-16 NOTE — Assessment & Plan Note (Signed)
The patient still has moderate peripheral edema but Lasix has helped.  He has nocturia x1.  His weight today is up 7 more pounds some of which is fluid weight.

## 2012-03-17 ENCOUNTER — Telehealth: Payer: Self-pay | Admitting: Cardiology

## 2012-03-17 MED ORDER — FUROSEMIDE 20 MG PO TABS: ORAL_TABLET | ORAL | Status: DC

## 2012-03-17 NOTE — Telephone Encounter (Signed)
New PRoblem:    Patient called in because his PCP instructed the patient to double his dose of furosemide (LASIX) 20 MG tablet and needed a new prescription reflecting this change sent in to his pharmacy because he will run out soon.  Please call back.

## 2012-03-17 NOTE — Telephone Encounter (Signed)
Advised of xray and sent in Rx

## 2012-03-17 NOTE — Telephone Encounter (Signed)
Message copied by Earvin Hansen on Wed Mar 17, 2012  5:35 PM ------      Message from: Darlin Coco      Created: Tue Mar 16, 2012  9:37 PM       Chest xray okay.  No sign of pulmonary toxicity.

## 2012-05-20 IMAGING — CR DG CHEST 1V PORT
1 series · 1 of 1 positions shown · non-contrast
Comparison: None.

CLINICAL DATA: Shortness of breath, arrhythmia and shakiness.
History of hypertension.

PORTABLE CHEST - 1 VIEW

[view not recorded]
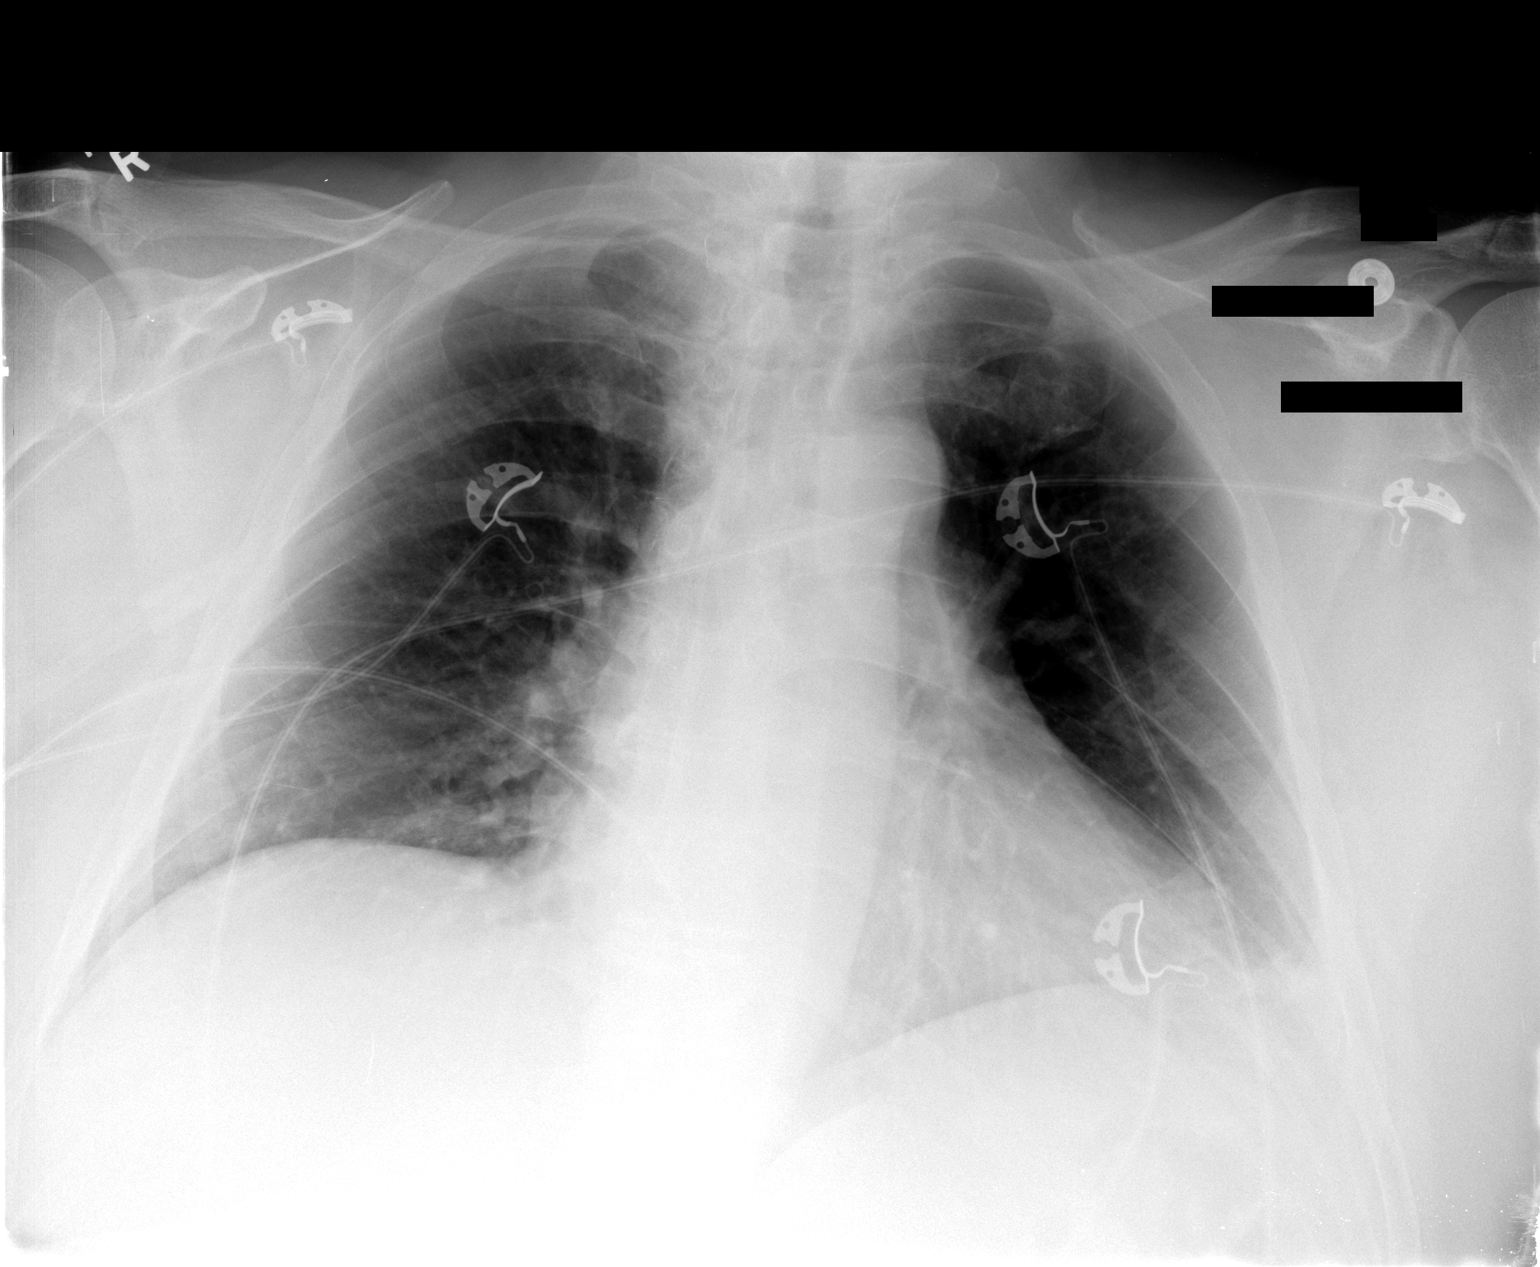

[1 of 1 positions shown; findings below may reference images not displayed]

FINDINGS: The lungs are relatively well-aerated; mild peripheral
left basilar atelectasis is noted.  There is no evidence of pleural
effusion or pneumothorax.

The cardiomediastinal silhouette is borderline normal in size.  No
acute osseous abnormalities are seen.
IMPRESSION: Mild left basilar atelectasis noted.

## 2012-05-24 ENCOUNTER — Other Ambulatory Visit: Payer: Self-pay | Admitting: *Deleted

## 2012-05-24 MED ORDER — AMIODARONE HCL 200 MG PO TABS: ORAL_TABLET | ORAL | Status: DC

## 2012-06-16 ENCOUNTER — Encounter: Payer: Self-pay | Admitting: Cardiology

## 2012-09-08 ENCOUNTER — Other Ambulatory Visit: Payer: Self-pay | Admitting: Cardiology

## 2012-09-13 ENCOUNTER — Encounter: Payer: Self-pay | Admitting: Cardiology

## 2012-09-13 ENCOUNTER — Ambulatory Visit (INDEPENDENT_AMBULATORY_CARE_PROVIDER_SITE_OTHER): Payer: Medicare Other | Admitting: Cardiology

## 2012-09-13 VITALS — BP 142/84 | HR 62 | Ht 69.0 in | Wt 302.8 lb

## 2012-09-13 DIAGNOSIS — I119 Hypertensive heart disease without heart failure: Secondary | ICD-10-CM

## 2012-09-13 DIAGNOSIS — Z7901 Long term (current) use of anticoagulants: Secondary | ICD-10-CM

## 2012-09-13 DIAGNOSIS — E785 Hyperlipidemia, unspecified: Secondary | ICD-10-CM

## 2012-09-13 DIAGNOSIS — I480 Paroxysmal atrial fibrillation: Secondary | ICD-10-CM

## 2012-09-13 DIAGNOSIS — I4891 Unspecified atrial fibrillation: Secondary | ICD-10-CM

## 2012-09-13 DIAGNOSIS — I10 Essential (primary) hypertension: Secondary | ICD-10-CM

## 2012-09-13 NOTE — Assessment & Plan Note (Signed)
The patient has not been our recurrent atrial fibrillation or palpitations.  He remains on low-dose amiodarone 100 mg daily.  Is not having any side effects from the amiodarone

## 2012-09-13 NOTE — Assessment & Plan Note (Signed)
The patient has not been having any symptoms of CHF.  No dizziness or syncope.  He does have moderate peripheral edema.

## 2012-09-13 NOTE — Patient Instructions (Signed)
Your physician wants you to follow-up in: 6 MONTHS WITH EKG You will receive a reminder letter in the mail two months in advance. If you don't receive a letter, please call our office to schedule the follow-up appointment.  Your physician recommends that you continue on your current medications as directed. Please refer to the Current Medication list given to you today.   Your physician recommends that you return for lab work in: [place]

## 2012-09-13 NOTE — Progress Notes (Signed)
Bradley Stokes Date of Birth:  04-Oct-1945 [facility] [address] [place], [place]  [address] [phone]         Fax   [phone]  History of Present Illness: This pleasant 67 year old gentleman is seen for a scheduled followup office visit. He has a past history of paroxysmal fibrillation .He is remaining in normal sinus rhythm on low-dose amiodarone. He has a history of essential hypertension and a recent history of gout and is now on allopurinol. He has had a problem in the past with exogenous obesity.  He has a history of some renal insufficiency and sees Dr. Justin Mend.  Current Outpatient Prescriptions  Medication Sig Dispense Refill  . acetaminophen (TYLENOL) 325 MG tablet Take 325 mg by mouth every 6 (six) hours as needed.      Marland Kitchen allopurinol (ZYLOPRIM) 100 MG tablet Take 100 mg by mouth daily.      Marland Kitchen amiodarone (PACERONE) 200 MG tablet Take 1/2 tablet by mouth daily  30 tablet  3  . aspirin 81 MG tablet Take 81 mg by mouth daily.        Marland Kitchen atorvastatin (LIPITOR) 20 MG tablet Take 20 mg by mouth Daily.      . furosemide (LASIX) 20 MG tablet TAKE 2 TABLETS BY MOUTH TWICE DAILY  120 tablet  2  . lisinopril (PRINIVIL,ZESTRIL) 10 MG tablet Take 20 mg by mouth daily.       . metoprolol tartrate (LOPRESSOR) 25 MG tablet TAKE 1/2 TABLET BY MOUTH TWICE DAILY  30 tablet  11  . warfarin (COUMADIN) 5 MG tablet Take 5 mg by mouth as directed. Followed by Dr.Rankin       No current facility-administered medications for this visit.    Allergies  Allergen Reactions  . Other     Tape,needs paper tape    Patient Active Problem List   Diagnosis Date Noted  . PAF (paroxysmal atrial fibrillation)     Priority: High  . Edema     Priority: Medium  . Long term use of drug 09/05/2011  . Long-term (current) use of anticoagulants   . Hyperlipidemia   . Hypertension     History  Smoking status  . Never Smoker   Smokeless tobacco  . Never Used    History  Alcohol  Use No    Family History  Problem Relation Age of Onset  . Pneumonia Mother   . Heart failure Mother   . Kidney failure Sister   . Heart disease Brother   . Crohn's disease Sister     Review of Systems: Constitutional: no fever chills diaphoresis or fatigue or change in weight.  Head and neck: no hearing loss, no epistaxis, no photophobia or visual disturbance. Respiratory: No cough, shortness of breath or wheezing. Cardiovascular: No chest pain peripheral edema, palpitations. Gastrointestinal: No abdominal distention, no abdominal pain, no change in bowel habits hematochezia or melena. Genitourinary: No dysuria, no frequency, no urgency, no nocturia. Musculoskeletal:No arthralgias, no back pain, no gait disturbance or myalgias. Neurological: No dizziness, no headaches, no numbness, no seizures, no syncope, no weakness, no tremors. Hematologic: No lymphadenopathy, no easy bruising. Psychiatric: No confusion, no hallucinations, no sleep disturbance.    Physical Exam: Filed Vitals:   09/13/12 1116  BP: 142/84  Pulse: 62   the general appearance reveals a well-developed large gentleman in no distress.The head and neck exam reveals pupils equal and reactive.  Extraocular movements are full.  There is no scleral icterus.  The mouth and pharynx are normal.  The neck is supple.  The carotids reveal no bruits.  The jugular venous pressure is normal.  The  thyroid is not enlarged.  There is no lymphadenopathy.  The chest is clear to percussion and auscultation.  There are no rales or rhonchi.  Expansion of the chest is symmetrical.  The precordium is quiet.  The first heart sound is normal.  The second heart sound is physiologically split.  There is no murmur gallop rub or click.  There is no abnormal lift or heave.  The abdomen is soft and nontender.  The bowel sounds are normal.  The liver and spleen are not enlarged.  There are no abdominal masses.  There are no abdominal bruits.   Extremities reveal good pedal pulses.  There is 2+ bilateral pretibial and pedal edema and he does have support stockings in place There is no cyanosis or clubbing.  Strength is normal and symmetrical in all extremities.  There is no lateralizing weakness.  There are no sensory deficits.  The skin is warm and dry.  There is no rash.     Assessment / Plan: The patient is to continue same medication.  Work harder on weight loss.  Recheck in 6 months for followup office visit EKG hepatic function panel CBC and TSH to follow his amiodarone exposure.

## 2012-09-13 NOTE — Assessment & Plan Note (Signed)
The patient is on long-term warfarin which is followed at the [facility] clinic.  Is not having any evidence of excessive bleeding or bruising or any history of hematochezia or melena

## 2012-12-05 ENCOUNTER — Other Ambulatory Visit: Payer: Self-pay | Admitting: Cardiology

## 2012-12-20 ENCOUNTER — Encounter: Payer: Self-pay | Admitting: Cardiology

## 2013-01-03 ENCOUNTER — Other Ambulatory Visit: Payer: Self-pay | Admitting: Cardiology

## 2013-01-31 ENCOUNTER — Other Ambulatory Visit: Payer: Self-pay | Admitting: Cardiology

## 2013-03-14 ENCOUNTER — Encounter: Payer: Self-pay | Admitting: Physician Assistant

## 2013-03-14 ENCOUNTER — Encounter (INDEPENDENT_AMBULATORY_CARE_PROVIDER_SITE_OTHER): Payer: Self-pay

## 2013-03-14 ENCOUNTER — Ambulatory Visit: Payer: Medicare Other | Admitting: Cardiology

## 2013-03-14 ENCOUNTER — Ambulatory Visit (INDEPENDENT_AMBULATORY_CARE_PROVIDER_SITE_OTHER): Payer: Medicare Other | Admitting: Physician Assistant

## 2013-03-14 VITALS — BP 134/74 | HR 59 | Ht 69.0 in | Wt 296.1 lb

## 2013-03-14 DIAGNOSIS — E785 Hyperlipidemia, unspecified: Secondary | ICD-10-CM

## 2013-03-14 DIAGNOSIS — I480 Paroxysmal atrial fibrillation: Secondary | ICD-10-CM

## 2013-03-14 DIAGNOSIS — I10 Essential (primary) hypertension: Secondary | ICD-10-CM

## 2013-03-14 DIAGNOSIS — I4891 Unspecified atrial fibrillation: Secondary | ICD-10-CM

## 2013-03-14 MED ORDER — AMIODARONE HCL 200 MG PO TABS: 100.0000 mg | ORAL_TABLET | Freq: Every day | ORAL | Status: DC

## 2013-03-14 NOTE — Patient Instructions (Signed)
A REFILL FOR AMIODARONE WAS SENT IN TO [facility]  Your physician wants you to follow-up in: [place]. You will receive a reminder letter in the mail two months in advance. If you don't receive a letter, please call our office to schedule the follow-up appointment.  Your physician recommends that you continue on your current medications as directed. Please refer to the Current Medication list given to you today.

## 2013-03-14 NOTE — Progress Notes (Signed)
[address], [place], [place]  [address] Phone: [phone] Fax:  [phone]  Date:  03/14/2013   ID:  Bradley Stokes, DOB Aug 03, 1945, MRN [id number]  PCP:  Bradley Evener, MD  Cardiologist:  Dr. Darlin Stokes     History of Present Illness: Bradley Stokes is a 67 y.o. male with a hx of paroxysmal AFib, HTN, HL, CKD. He has maintained sinus rhythm on low dose amiodarone and Coumadin. Adenosine Cardiolite (12/2007): No ischemia, EF 62%; normal study.  Echo (10/2009): Mild LVH, EF 55-60%, normal wall motion.  Last seen by Dr. Mare Ferrari 08/2012.  He is doing well.  He denies chest pain, shortness of breath, syncope, orthopnea, PND.  He has chronic LE edema without change.   He denies palpitations.    Recent Labs: No results found for requested labs within last 365 days. 02/15/2013:  K 4.6, creatinine 1.79, ALT 8, TSH 0.60, LDL 60  Chest x-ray (03/16/2012): IMPRESSION: Borderline cardiac size is stable. No pulmonary or pleural abnormalities are seen. No acute abnormality is evident.   Wt Readings from Last 3 Encounters:  09/13/12 302 lb 12.8 oz (137.349 kg)  03/16/12 303 lb (137.44 kg)  09/05/11 296 lb (134.265 kg)     Past Medical History  Diagnosis Date  . PAF (paroxysmal atrial fibrillation)   . Edema   . Long-term (current) use of anticoagulants   . Hyperlipidemia   . Hypertension   . Chest pain   . Arthritis     severe arthritis and deformity of his knee's  . Kidney stones   . History of cardiovascular stress test 01/17/2008    EF- 62%  . Obesity     Current Outpatient Prescriptions  Medication Sig Dispense Refill  . acetaminophen (TYLENOL) 325 MG tablet Take 325 mg by mouth every 6 (six) hours as needed.      Marland Kitchen allopurinol (ZYLOPRIM) 100 MG tablet Take 100 mg by mouth daily.      Marland Kitchen amiodarone (PACERONE) 200 MG tablet TAKE 1/2 TABLET BY MOUTH DAILY  30 tablet  0  . aspirin 81 MG tablet Take 81 mg by mouth daily.        Marland Kitchen atorvastatin (LIPITOR)  20 MG tablet Take 20 mg by mouth Daily.      . furosemide (LASIX) 20 MG tablet TAKE 2 TABLETS BY MOUTH TWICE DAILY  120 tablet  0  . lisinopril (PRINIVIL,ZESTRIL) 10 MG tablet Take 20 mg by mouth daily.       . metoprolol tartrate (LOPRESSOR) 25 MG tablet TAKE 1/2 TABLET BY MOUTH TWICE DAILY  30 tablet  11  . metoprolol tartrate (LOPRESSOR) 25 MG tablet TAKE 1/2 TABLET BY MOUTH TWICE DAILY  30 tablet  0  . warfarin (COUMADIN) 5 MG tablet Take 5 mg by mouth as directed. Followed by Dr.Rankin       No current facility-administered medications for this visit.    Allergies:   Other   Social History:  The patient  reports that he has never smoked. He has never used smokeless tobacco. He reports that he does not drink alcohol or use illicit drugs.   Family History:  The patient's family history includes Crohn's disease in his sister; Heart disease in his brother; Heart failure in his mother; Kidney failure in his sister; Pneumonia in his mother.   ROS:  Please see the history of present illness.      All other systems reviewed and negative.   PHYSICAL EXAM: VS:  BP 134/74  Pulse 59  Ht 5\' 9"  (1.753 m)  Wt 296 lb 1.9 oz (134.319 kg)  BMI 43.71 kg/m2 Well nourished, well developed, in no acute distress HEENT: normal Neck: no JVD Cardiac:  normal S1, S2; RRR; no murmur Lungs:  clear to auscultation bilaterally, no wheezing, rhonchi or rales Abd: soft, nontender, no hepatomegaly Ext: no edema Skin: warm and dry Neuro:  CNs 2-12 intact, no focal abnormalities noted  EKG:  Sinus brady, HR 59, no ST changes, QTc 427     ASSESSMENT AND PLAN:  1. Paroxysmal Atrial Fibrillation:  Maintaining NSR on low dose Amiodarone.  CXR last year was ok.  Recent TSH and LFTs ok.  Coumadin is managed by PCP.  2. Hypertension:  Controlled.  3. Hyperlipidemia:  Continue statin.  Labs managed by PCP.  4. Chronic Kidney Disease:  Managed by Dr. Justin Mend.  5. Disposition:  F/u with Dr. Darlin Stokes in 6 mos.     Signed, Bradley Dopp, PA-C  03/14/2013 11:25 AM

## 2013-11-22 ENCOUNTER — Encounter: Payer: Self-pay | Admitting: Cardiology

## 2013-11-22 ENCOUNTER — Ambulatory Visit (INDEPENDENT_AMBULATORY_CARE_PROVIDER_SITE_OTHER): Payer: Commercial Managed Care - HMO | Admitting: Cardiology

## 2013-11-22 VITALS — BP 144/66 | HR 53 | Ht 70.0 in | Wt 293.0 lb

## 2013-11-22 DIAGNOSIS — I4891 Unspecified atrial fibrillation: Secondary | ICD-10-CM | POA: Diagnosis not present

## 2013-11-22 DIAGNOSIS — I10 Essential (primary) hypertension: Secondary | ICD-10-CM | POA: Diagnosis not present

## 2013-11-22 DIAGNOSIS — I480 Paroxysmal atrial fibrillation: Secondary | ICD-10-CM

## 2013-11-22 DIAGNOSIS — Z79899 Other long term (current) drug therapy: Secondary | ICD-10-CM

## 2013-11-22 DIAGNOSIS — I119 Hypertensive heart disease without heart failure: Secondary | ICD-10-CM | POA: Diagnosis not present

## 2013-11-22 DIAGNOSIS — Z7901 Long term (current) use of anticoagulants: Secondary | ICD-10-CM

## 2013-11-22 DIAGNOSIS — R609 Edema, unspecified: Secondary | ICD-10-CM

## 2013-11-22 DIAGNOSIS — E785 Hyperlipidemia, unspecified: Secondary | ICD-10-CM | POA: Diagnosis not present

## 2013-11-22 LAB — CBC WITH DIFFERENTIAL/PLATELET
BASOS PCT: 0.4 % (ref 0.0–3.0)
Basophils Absolute: 0 10*3/uL (ref 0.0–0.1)
Eosinophils Absolute: 0.1 10*3/uL (ref 0.0–0.7)
Eosinophils Relative: 1.7 % (ref 0.0–5.0)
HCT: 39 % (ref 39.0–52.0)
Hemoglobin: 12.8 g/dL — ABNORMAL LOW (ref 13.0–17.0)
LYMPHS PCT: 18 % (ref 12.0–46.0)
Lymphs Abs: 1.5 10*3/uL (ref 0.7–4.0)
MCHC: 32.8 g/dL (ref 30.0–36.0)
MCV: 96.9 fl (ref 78.0–100.0)
MONO ABS: 0.7 10*3/uL (ref 0.1–1.0)
MONOS PCT: 8.6 % (ref 3.0–12.0)
Neutro Abs: 6 10*3/uL (ref 1.4–7.7)
Neutrophils Relative %: 71.3 % (ref 43.0–77.0)
PLATELETS: 239 10*3/uL (ref 150.0–400.0)
RBC: 4.03 Mil/uL — ABNORMAL LOW (ref 4.22–5.81)
RDW: 14.3 % (ref 11.5–15.5)
WBC: 8.4 10*3/uL (ref 4.0–10.5)

## 2013-11-22 LAB — BASIC METABOLIC PANEL
BUN: 28 mg/dL — AB (ref 6–23)
CHLORIDE: 103 meq/L (ref 96–112)
CO2: 30 mEq/L (ref 19–32)
CREATININE: 2.2 mg/dL — AB (ref 0.4–1.5)
Calcium: 9 mg/dL (ref 8.4–10.5)
GFR: 32.44 mL/min — ABNORMAL LOW (ref >60.00)
GLUCOSE: 93 mg/dL (ref 70–99)
POTASSIUM: 4.6 meq/L (ref 3.5–5.1)
Sodium: 141 mEq/L (ref 135–145)

## 2013-11-22 LAB — LIPID PANEL
CHOL/HDL RATIO: 3
CHOLESTEROL: 125 mg/dL (ref 0–200)
HDL: 38.6 mg/dL — ABNORMAL LOW (ref >39.00)
LDL CALC: 61 mg/dL (ref 0–99)
NONHDL: 86.4
Triglycerides: 125 mg/dL (ref 0.0–149.0)
VLDL: 25 mg/dL (ref 0.0–40.0)

## 2013-11-22 LAB — HEPATIC FUNCTION PANEL
ALT: 8 U/L (ref 0–53)
AST: 16 U/L (ref 0–37)
Albumin: 3.6 g/dL (ref 3.5–5.2)
Alkaline Phosphatase: 145 U/L — ABNORMAL HIGH (ref 39–117)
BILIRUBIN DIRECT: 0.1 mg/dL (ref 0.0–0.3)
BILIRUBIN TOTAL: 0.9 mg/dL (ref 0.2–1.2)
Total Protein: 7.2 g/dL (ref 6.0–8.3)

## 2013-11-22 NOTE — Assessment & Plan Note (Signed)
The patient has been on long-term warfarin anticoagulation.  This is monitored at Dr. Dahlia Bailiff office.  He has not been having a symptoms of hematochezia or melena.

## 2013-11-22 NOTE — Assessment & Plan Note (Signed)
Since last visit the patient has not had any recurrence of atrial fibrillation .  He remains on low dose amiodarone.  No symptoms of hypothyroidism.  We are checking thyroid functions today with his other lab work

## 2013-11-22 NOTE — Assessment & Plan Note (Signed)
The patient is not having any chest pain or shortness of breath.  Blood pressure stable on current therapy.

## 2013-11-22 NOTE — Progress Notes (Signed)
Bradley Stokes Date of Birth:  January 22, 1946 [place], [place]  [address] [phone]        Fax   [phone]   History of Present Illness: This pleasant 68 year old gentleman is seen for a scheduled followup office visit. He has a past history of paroxysmal fibrillation .He is remaining in normal sinus rhythm on low-dose amiodarone. He has a history of essential hypertension and a recent history of gout and is now on allopurinol. He has had a problem in the past with exogenous obesity. He has a history of some renal insufficiency and sees Dr. Justin Mend.  Since last visit he has been doing well with no new cardiac symptoms.  He has a history of hypercholesterolemia and is on atorvastatin 20 mg daily.   Current Outpatient Prescriptions  Medication Sig Dispense Refill  . acetaminophen (TYLENOL) 325 MG tablet Take 325 mg by mouth every 6 (six) hours as needed.      Marland Kitchen allopurinol (ZYLOPRIM) 100 MG tablet Take 100 mg by mouth daily.      Marland Kitchen amiodarone (PACERONE) 200 MG tablet Take 0.5 tablets (100 mg total) by mouth daily.  30 tablet  11  . aspirin 81 MG tablet Take 81 mg by mouth daily.        Marland Kitchen atorvastatin (LIPITOR) 20 MG tablet Take 20 mg by mouth Daily.      . furosemide (LASIX) 20 MG tablet TAKE 2 TABLETS BY MOUTH TWICE DAILY  120 tablet  0  . lisinopril (PRINIVIL,ZESTRIL) 20 MG tablet Take 20 mg by mouth daily.      . metoprolol tartrate (LOPRESSOR) 25 MG tablet TAKE 1/2 TABLET BY MOUTH TWICE DAILY  30 tablet  11  . warfarin (COUMADIN) 5 MG tablet Take 5 mg by mouth as directed. Followed by Dr.Rankin       No current facility-administered medications for this visit.    Allergies  Allergen Reactions  . Other     Tape,needs paper tape    Patient Active Problem List   Diagnosis Date Noted  . PAF (paroxysmal atrial fibrillation)     Priority: High  . Edema     Priority: Medium  . Long term use of drug 09/05/2011  . Long-term (current)  use of anticoagulants   . Hyperlipidemia   . Hypertension     History  Smoking status  . Never Smoker   Smokeless tobacco  . Never Used    History  Alcohol Use No    Family History  Problem Relation Age of Onset  . Pneumonia Mother   . Heart failure Mother   . Kidney failure Sister   . Heart disease Brother   . Crohn's disease Sister     Review of Systems: Constitutional: no fever chills diaphoresis or fatigue or change in weight.  Head and neck: no hearing loss, no epistaxis, no photophobia or visual disturbance. Respiratory: No cough, shortness of breath or wheezing. Cardiovascular: No chest pain peripheral edema, palpitations. Gastrointestinal: No abdominal distention, no abdominal pain, no change in bowel habits hematochezia or melena. Genitourinary: No dysuria, no frequency, no urgency, no nocturia. Musculoskeletal:No arthralgias, no back pain, no gait disturbance or myalgias. Neurological: No dizziness, no headaches, no numbness, no seizures, no syncope, no weakness, no tremors. Hematologic: No lymphadenopathy, no easy bruising. Psychiatric: No confusion, no hallucinations, no sleep disturbance.    Physical Exam: Filed Vitals:   11/22/13 0958  BP: 144/66  Pulse: 53  The patient appears to be in no distress.  Head and neck exam reveals that the pupils are equal and reactive.  The extraocular movements are full.  There is no scleral icterus.  Mouth and pharynx are benign.  No lymphadenopathy.  No carotid bruits.  The jugular venous pressure is normal.  Thyroid is not enlarged or tender.  Chest is clear to percussion and auscultation.  No rales or rhonchi.  Expansion of the chest is symmetrical.  Heart reveals no abnormal lift or heave.  The pulse is irregularly irregular. First and second heart sounds are normal.  There is no murmur gallop rub or click.  The abdomen is soft and nontender.  Bowel sounds are normoactive.  There is no hepatosplenomegaly or mass.   There are no abdominal bruits.  Extremities reveal no phlebitis.  There is mild pretibial and pedal edema left greater than right.  He is wearing support hose bilaterally.  Pedal pulses are good.  There is no cyanosis or clubbing.  Neurologic exam is normal strength and no lateralizing weakness.  No sensory deficits.  Integument reveals no rash  EKG shows sinus bradycardia otherwise normal  Assessment / Plan: 1. paroxysmal atrial fibrillation, holding normal sinus rhythm on low dose amiodarone and beta blocker 2. essential hypertension 3. Hyperlipidemia 4. chronic kidney disease managed by Dr. Justin Mend  Disposition: Continue same medication.  Lab work today pending.  Recheck in 6 months for followup office visit.

## 2013-11-22 NOTE — Patient Instructions (Signed)
Will obtain labs today and call you with the results (TSH/CBC/BMET/HFP/LP)  Your physician recommends that you continue on your current medications as directed. Please refer to the Current Medication list given to you today.  Your physician wants you to follow-up in: [place] will receive a reminder letter in the mail two months in advance. If you don't receive a letter, please call our office to schedule the follow-up appointment.

## 2013-11-23 LAB — TSH: TSH: 0.67 u[IU]/mL (ref 0.35–4.50)

## 2013-11-23 NOTE — Progress Notes (Signed)
Quick Note:  Please report to patient. The recent labs are stable. Continue same medication and careful diet. The thyroid is stable on amiodarone. The creatinine is higher. He needs to drink more water.. ______

## 2014-03-28 ENCOUNTER — Other Ambulatory Visit: Payer: Self-pay | Admitting: Physician Assistant

## 2014-04-04 DIAGNOSIS — H2513 Age-related nuclear cataract, bilateral: Secondary | ICD-10-CM | POA: Diagnosis not present

## 2014-04-04 DIAGNOSIS — H25013 Cortical age-related cataract, bilateral: Secondary | ICD-10-CM | POA: Diagnosis not present

## 2014-04-04 DIAGNOSIS — H18413 Arcus senilis, bilateral: Secondary | ICD-10-CM | POA: Diagnosis not present

## 2014-04-04 DIAGNOSIS — I10 Essential (primary) hypertension: Secondary | ICD-10-CM | POA: Diagnosis not present

## 2014-04-04 DIAGNOSIS — H25043 Posterior subcapsular polar age-related cataract, bilateral: Secondary | ICD-10-CM | POA: Diagnosis not present

## 2014-04-18 DIAGNOSIS — I480 Paroxysmal atrial fibrillation: Secondary | ICD-10-CM | POA: Diagnosis not present

## 2014-04-18 DIAGNOSIS — Z7901 Long term (current) use of anticoagulants: Secondary | ICD-10-CM | POA: Diagnosis not present

## 2014-05-10 DIAGNOSIS — N2581 Secondary hyperparathyroidism of renal origin: Secondary | ICD-10-CM | POA: Diagnosis not present

## 2014-05-10 DIAGNOSIS — N183 Chronic kidney disease, stage 3 (moderate): Secondary | ICD-10-CM | POA: Diagnosis not present

## 2014-05-10 DIAGNOSIS — N189 Chronic kidney disease, unspecified: Secondary | ICD-10-CM | POA: Diagnosis not present

## 2014-05-10 DIAGNOSIS — D631 Anemia in chronic kidney disease: Secondary | ICD-10-CM | POA: Diagnosis not present

## 2014-05-17 DIAGNOSIS — I4891 Unspecified atrial fibrillation: Secondary | ICD-10-CM | POA: Diagnosis not present

## 2014-05-17 DIAGNOSIS — Z7901 Long term (current) use of anticoagulants: Secondary | ICD-10-CM | POA: Diagnosis not present

## 2014-05-22 ENCOUNTER — Ambulatory Visit: Payer: Commercial Managed Care - HMO | Admitting: Cardiology

## 2014-05-24 ENCOUNTER — Encounter: Payer: Self-pay | Admitting: Cardiology

## 2014-05-24 ENCOUNTER — Ambulatory Visit (INDEPENDENT_AMBULATORY_CARE_PROVIDER_SITE_OTHER): Payer: Commercial Managed Care - HMO | Admitting: Cardiology

## 2014-05-24 VITALS — BP 134/74 | HR 68 | Ht 70.0 in | Wt 293.4 lb

## 2014-05-24 DIAGNOSIS — R609 Edema, unspecified: Secondary | ICD-10-CM

## 2014-05-24 DIAGNOSIS — I480 Paroxysmal atrial fibrillation: Secondary | ICD-10-CM

## 2014-05-24 DIAGNOSIS — I119 Hypertensive heart disease without heart failure: Secondary | ICD-10-CM

## 2014-05-24 NOTE — Patient Instructions (Signed)
STOP ASPIRIN    CONTINUE OTHER MEDICATIONS  Your physician wants you to follow-up in: 6 MONTH OV/EKG/CBC/TSHYou will receive a reminder letter in the mail two months in advance. If you don't receive a letter, please call our office to schedule the follow-up appointment.

## 2014-05-24 NOTE — Progress Notes (Signed)
Cardiology Office Note   Date:  05/24/2014   ID:  Bradley Stokes, DOB [birth year], MRN [id number]  PCP:  Milagros Evener, MD  Cardiologist:   Darlin Coco, MD   No chief complaint on file.     History of Present Illness: Bradley Stokes is a 69 y.o. male who presents for a six-month follow-up office visit. This pleasant 69 year old gentleman is seen for a scheduled followup office visit. He has a past history of paroxysmal fibrillation .He is remaining in normal sinus rhythm on low-dose amiodarone. He has a history of essential hypertension and a recent history of gout and is now on allopurinol.  Since starting the allopurinol he has not had any recurrent episodes of gout.  He has had a problem in the past with exogenous obesity.  His weight is unchanged at 293 pounds since August 2015 He has a history of some renal insufficiency and sees Dr. Justin Mend. Since last visit he has been doing well with no new cardiac symptoms.  He has not been aware of any racing of his heart or recurrent atrial fibrillation.  He is on long-term Coumadin managed through Dr. Dahlia Bailiff office. He has a history of hypercholesterolemia and is on atorvastatin 20 mg daily.   Past Medical History  Diagnosis Date  . PAF (paroxysmal atrial fibrillation)   . Edema   . Long-term (current) use of anticoagulants   . Hyperlipidemia   . Hypertension   . Chest pain   . Arthritis     severe arthritis and deformity of his knee's  . Kidney stones   . History of cardiovascular stress test 01/17/2008    EF- 62%  . Obesity     Past Surgical History  Procedure Laterality Date  . Cardiolite stress test      EF 62%     Current Outpatient Prescriptions  Medication Sig Dispense Refill  . acetaminophen (TYLENOL) 325 MG tablet Take 325 mg by mouth every 6 (six) hours as needed.    Marland Kitchen allopurinol (ZYLOPRIM) 100 MG tablet Take 100 mg by mouth daily.    Marland Kitchen amiodarone (PACERONE) 200 MG tablet TAKE 1/2 TABLET BY MOUTH DAILY  30 tablet 0  . atorvastatin (LIPITOR) 20 MG tablet Take 20 mg by mouth Daily.    Marland Kitchen BESIVANCE 0.6 % SUSP daily.   1  . calcitRIOL (ROCALTROL) 0.25 MCG capsule Take 0.25 mcg by mouth every other day. Take on Mondays, Wednesdays, and Fridays.  12  . DUREZOL 0.05 % EMUL   1  . furosemide (LASIX) 20 MG tablet TAKE 2 TABLETS BY MOUTH TWICE DAILY 120 tablet 0  . ILEVRO 0.3 % SUSP daily.   1  . lisinopril (PRINIVIL,ZESTRIL) 20 MG tablet Take 20 mg by mouth daily.    . metoprolol tartrate (LOPRESSOR) 25 MG tablet TAKE 1/2 TABLET BY MOUTH TWICE DAILY 30 tablet 11  . warfarin (COUMADIN) 5 MG tablet Take 5 mg by mouth as directed. Take 5 mg everyday except take 2.5 mg on Wednesday.     No current facility-administered medications for this visit.    Allergies:   Other    Social History:  The patient  reports that he has never smoked. He has never used smokeless tobacco. He reports that he does not drink alcohol or use illicit drugs.   Family History:  The patient's family history includes Crohn's disease in his sister; Heart disease in his brother; Heart failure in his mother; Kidney failure in his sister; Pneumonia in  his mother.    ROS:  Please see the history of present illness.   Otherwise, review of systems are positive for none.   All other systems are reviewed and negative.    PHYSICAL EXAM: VS:  BP 134/74 mmHg  Pulse 68  Ht 5\' 10"  (1.778 m)  Wt 293 lb 6.4 oz (133.085 kg)  BMI 42.10 kg/m2 , BMI Body mass index is 42.1 kg/(m^2). GEN: Well nourished, well developed, in no acute distress HEENT: normal Neck: no JVD, carotid bruits, or masses Cardiac: RRR; no murmurs, rubs, or gallops,no edema  Respiratory:  clear to auscultation bilaterally, normal work of breathing GI: soft, nontender, nondistended, + BS MS: no deformity or atrophy Skin: warm and dry, no rash Neuro:  Strength and sensation are intact Psych: euthymic mood, full affect   EKG:  EKG is not ordered today.    Recent  Labs: 11/22/2013: ALT 8; BUN 28*; Creatinine 2.2*; Hemoglobin 12.8*; Platelets 239.0; Potassium 4.6; Sodium 141; TSH 0.67    Lipid Panel    Component Value Date/Time   CHOL 125 11/22/2013 1111   TRIG 125.0 11/22/2013 1111   HDL 38.60* 11/22/2013 1111   CHOLHDL 3 11/22/2013 1111   VLDL 25.0 11/22/2013 1111   LDLCALC 61 11/22/2013 1111      Wt Readings from Last 3 Encounters:  05/24/14 293 lb 6.4 oz (133.085 kg)  11/22/13 293 lb (132.904 kg)  03/14/13 296 lb 1.9 oz (134.319 kg)        ASSESSMENT AND PLAN: 1. paroxysmal atrial fibrillation, holding normal sinus rhythm on low dose amiodarone and beta blocker 2. essential hypertension 3. Hyperlipidemia 4. chronic kidney disease managed by Dr. Justin Mend   Current medicines are reviewed at length with the patient today.  The patient does not have concerns regarding medicines.  The following changes have been made:  no change  Labs/ tests ordered today include: None   Orders Placed This Encounter  Procedures  . CBC with Differential/Platelet  . TSH     Disposition:   FU with Dr. Mare Ferrari in 6 months for office visit EKG CBC and TSH to follow up on chronic amiodarone therapy. Continue same medication except he no longer needs to take 81 mg aspirin since he is on full anticoagulation with warfarin.   Signed, Darlin Coco, MD  05/24/2014 12:37 PM    [facility] [place], [place], [place]  [address] Phone: [phone]; Fax: [phone]

## 2014-05-30 ENCOUNTER — Other Ambulatory Visit: Payer: Self-pay | Admitting: Cardiology

## 2014-06-05 DIAGNOSIS — H5211 Myopia, right eye: Secondary | ICD-10-CM | POA: Diagnosis not present

## 2014-06-05 DIAGNOSIS — H25811 Combined forms of age-related cataract, right eye: Secondary | ICD-10-CM | POA: Diagnosis not present

## 2014-06-05 DIAGNOSIS — H2511 Age-related nuclear cataract, right eye: Secondary | ICD-10-CM | POA: Diagnosis not present

## 2014-06-14 DIAGNOSIS — Z7901 Long term (current) use of anticoagulants: Secondary | ICD-10-CM | POA: Diagnosis not present

## 2014-06-14 DIAGNOSIS — I480 Paroxysmal atrial fibrillation: Secondary | ICD-10-CM | POA: Diagnosis not present

## 2014-06-14 DIAGNOSIS — S7011XA Contusion of right thigh, initial encounter: Secondary | ICD-10-CM | POA: Diagnosis not present

## 2014-06-16 DIAGNOSIS — I4891 Unspecified atrial fibrillation: Secondary | ICD-10-CM | POA: Diagnosis not present

## 2014-06-16 DIAGNOSIS — Z7901 Long term (current) use of anticoagulants: Secondary | ICD-10-CM | POA: Diagnosis not present

## 2014-06-19 DIAGNOSIS — Z7901 Long term (current) use of anticoagulants: Secondary | ICD-10-CM | POA: Diagnosis not present

## 2014-06-19 DIAGNOSIS — I480 Paroxysmal atrial fibrillation: Secondary | ICD-10-CM | POA: Diagnosis not present

## 2014-06-19 DIAGNOSIS — M25569 Pain in unspecified knee: Secondary | ICD-10-CM | POA: Diagnosis not present

## 2014-06-26 DIAGNOSIS — I480 Paroxysmal atrial fibrillation: Secondary | ICD-10-CM | POA: Diagnosis not present

## 2014-06-26 DIAGNOSIS — Z7901 Long term (current) use of anticoagulants: Secondary | ICD-10-CM | POA: Diagnosis not present

## 2014-07-19 DIAGNOSIS — Z961 Presence of intraocular lens: Secondary | ICD-10-CM | POA: Diagnosis not present

## 2014-07-28 DIAGNOSIS — Z961 Presence of intraocular lens: Secondary | ICD-10-CM | POA: Diagnosis not present

## 2014-08-07 DIAGNOSIS — I4891 Unspecified atrial fibrillation: Secondary | ICD-10-CM | POA: Diagnosis not present

## 2014-08-07 DIAGNOSIS — Z7901 Long term (current) use of anticoagulants: Secondary | ICD-10-CM | POA: Diagnosis not present

## 2014-09-05 DIAGNOSIS — I4891 Unspecified atrial fibrillation: Secondary | ICD-10-CM | POA: Diagnosis not present

## 2014-09-05 DIAGNOSIS — Z7901 Long term (current) use of anticoagulants: Secondary | ICD-10-CM | POA: Diagnosis not present

## 2014-09-12 DIAGNOSIS — I480 Paroxysmal atrial fibrillation: Secondary | ICD-10-CM | POA: Diagnosis not present

## 2014-09-12 DIAGNOSIS — Z7901 Long term (current) use of anticoagulants: Secondary | ICD-10-CM | POA: Diagnosis not present

## 2014-09-19 DIAGNOSIS — I482 Chronic atrial fibrillation: Secondary | ICD-10-CM | POA: Diagnosis not present

## 2014-09-19 DIAGNOSIS — Z7901 Long term (current) use of anticoagulants: Secondary | ICD-10-CM | POA: Diagnosis not present

## 2014-10-04 DIAGNOSIS — I482 Chronic atrial fibrillation: Secondary | ICD-10-CM | POA: Diagnosis not present

## 2014-10-04 DIAGNOSIS — Z7901 Long term (current) use of anticoagulants: Secondary | ICD-10-CM | POA: Diagnosis not present

## 2014-11-01 DIAGNOSIS — Z7901 Long term (current) use of anticoagulants: Secondary | ICD-10-CM | POA: Diagnosis not present

## 2014-11-01 DIAGNOSIS — I480 Paroxysmal atrial fibrillation: Secondary | ICD-10-CM | POA: Diagnosis not present

## 2014-11-29 ENCOUNTER — Other Ambulatory Visit: Payer: Self-pay | Admitting: Cardiology

## 2014-11-29 DIAGNOSIS — Z7901 Long term (current) use of anticoagulants: Secondary | ICD-10-CM | POA: Diagnosis not present

## 2014-11-29 DIAGNOSIS — I480 Paroxysmal atrial fibrillation: Secondary | ICD-10-CM | POA: Diagnosis not present

## 2014-12-29 DIAGNOSIS — I482 Chronic atrial fibrillation: Secondary | ICD-10-CM | POA: Diagnosis not present

## 2014-12-29 DIAGNOSIS — Z7901 Long term (current) use of anticoagulants: Secondary | ICD-10-CM | POA: Diagnosis not present

## 2015-01-01 ENCOUNTER — Ambulatory Visit (INDEPENDENT_AMBULATORY_CARE_PROVIDER_SITE_OTHER): Payer: Commercial Managed Care - HMO | Admitting: Cardiology

## 2015-01-01 ENCOUNTER — Encounter: Payer: Self-pay | Admitting: Cardiology

## 2015-01-01 VITALS — BP 144/76 | HR 58 | Ht 69.0 in | Wt 298.0 lb

## 2015-01-01 DIAGNOSIS — R011 Cardiac murmur, unspecified: Secondary | ICD-10-CM | POA: Insufficient documentation

## 2015-01-01 DIAGNOSIS — R010 Benign and innocent cardiac murmurs: Secondary | ICD-10-CM

## 2015-01-01 DIAGNOSIS — I119 Hypertensive heart disease without heart failure: Secondary | ICD-10-CM

## 2015-01-01 DIAGNOSIS — I480 Paroxysmal atrial fibrillation: Secondary | ICD-10-CM

## 2015-01-01 DIAGNOSIS — Z79899 Other long term (current) drug therapy: Secondary | ICD-10-CM | POA: Diagnosis not present

## 2015-01-01 DIAGNOSIS — I4891 Unspecified atrial fibrillation: Secondary | ICD-10-CM | POA: Diagnosis not present

## 2015-01-01 LAB — CBC WITH DIFFERENTIAL/PLATELET
BASOS PCT: 1 % (ref 0–1)
Basophils Absolute: 0.1 10*3/uL (ref 0.0–0.1)
EOS ABS: 0.2 10*3/uL (ref 0.0–0.7)
Eosinophils Relative: 2 % (ref 0–5)
HCT: 39.7 % (ref 39.0–52.0)
HEMOGLOBIN: 13 g/dL (ref 13.0–17.0)
Lymphocytes Relative: 20 % (ref 12–46)
Lymphs Abs: 1.6 10*3/uL (ref 0.7–4.0)
MCH: 30.8 pg (ref 26.0–34.0)
MCHC: 32.7 g/dL (ref 30.0–36.0)
MCV: 94.1 fL (ref 78.0–100.0)
MONOS PCT: 9 % (ref 3–12)
MPV: 10.7 fL (ref 8.6–12.4)
Monocytes Absolute: 0.7 10*3/uL (ref 0.1–1.0)
NEUTROS ABS: 5.4 10*3/uL (ref 1.7–7.7)
NEUTROS PCT: 68 % (ref 43–77)
PLATELETS: 283 10*3/uL (ref 150–400)
RBC: 4.22 MIL/uL (ref 4.22–5.81)
RDW: 13.5 % (ref 11.5–15.5)
WBC: 7.9 10*3/uL (ref 4.0–10.5)

## 2015-01-01 LAB — BASIC METABOLIC PANEL
BUN: 28 mg/dL — ABNORMAL HIGH (ref 7–25)
CALCIUM: 9.3 mg/dL (ref 8.6–10.3)
CO2: 28 mmol/L (ref 20–31)
CREATININE: 1.79 mg/dL — AB (ref 0.70–1.18)
Chloride: 104 mmol/L (ref 98–110)
Glucose, Bld: 101 mg/dL — ABNORMAL HIGH (ref 65–99)
Potassium: 4.1 mmol/L (ref 3.5–5.3)
SODIUM: 143 mmol/L (ref 135–146)

## 2015-01-01 LAB — HEPATIC FUNCTION PANEL
ALBUMIN: 3.9 g/dL (ref 3.6–5.1)
ALK PHOS: 132 U/L — AB (ref 40–115)
ALT: 5 U/L — AB (ref 9–46)
AST: 13 U/L (ref 10–35)
BILIRUBIN TOTAL: 0.7 mg/dL (ref 0.2–1.2)
Bilirubin, Direct: 0.1 mg/dL (ref <=0.2)
Indirect Bilirubin: 0.6 mg/dL (ref 0.2–1.2)
TOTAL PROTEIN: 7.2 g/dL (ref 6.1–8.1)

## 2015-01-01 LAB — TSH: TSH: 0.695 u[IU]/mL (ref 0.350–4.500)

## 2015-01-01 NOTE — Progress Notes (Signed)
Cardiology Office Note   Date:  01/01/2015   ID:  Bradley Stokes, DOB 09-May-1945, MRN [id number]  PCP:  Milagros Evener, MD  Cardiologist: Darlin Coco MD  No chief complaint on file.     History of Present Illness: Bradley Stokes is a 69 y.o. male who presents for scheduled follow-up office visit   This pleasant 69 year old gentleman is seen for a scheduled followup office visit. He has a past history of paroxysmal fibrillation .He has been remaining in normal sinus rhythm on low-dose amiodarone 100 mg daily. He has a history of essential hypertension and a recent history of gout and is now on allopurinol. Since starting the allopurinol he has not had any recurrent episodes of gout. He has had a problem in the past with exogenous obesity. His weight is up 5 pounds since last visit.  He now weighs 298. He has a history of some renal insufficiency and sees Dr. Justin Mend. Since last visit he has been doing well with no new cardiac symptoms. He has not been aware of any racing of his heart or recurrent atrial fibrillation. He is on long-term Coumadin managed through Dr. Dahlia Bailiff office. He has a history of hypercholesterolemia and is on atorvastatin 20 mg daily. He does complain of some chronic sinus problems with postnasal drainage and a nonproductive cough.. The patient has a history of previous chronic peripheral edema anywhere support hose from morning to night.  Past Medical History  Diagnosis Date  . PAF (paroxysmal atrial fibrillation) ([place])   . Edema   . Long-term (current) use of anticoagulants   . Hyperlipidemia   . Hypertension   . Chest pain   . Arthritis     severe arthritis and deformity of his knee's  . Kidney stones   . History of cardiovascular stress test 01/17/2008    EF- 62%  . Obesity     Past Surgical History  Procedure Laterality Date  . Cardiolite stress test      EF 62%     Current Outpatient Prescriptions  Medication Sig Dispense Refill   . acetaminophen (TYLENOL) 325 MG tablet Take 325 mg by mouth every 6 (six) hours as needed (pain).     Marland Kitchen allopurinol (ZYLOPRIM) 100 MG tablet Take 100 mg by mouth daily.    Marland Kitchen amiodarone (PACERONE) 200 MG tablet TAKE 1/2 TABLET BY MOUTH EVERY DAY 15 tablet 5  . atorvastatin (LIPITOR) 20 MG tablet Take 20 mg by mouth Daily.    . calcitRIOL (ROCALTROL) 0.25 MCG capsule Take 0.25 mcg by mouth every other day. Take on Mondays, Wednesdays, and Fridays.  12  . furosemide (LASIX) 20 MG tablet TAKE 2 TABLETS BY MOUTH TWICE DAILY 120 tablet 0  . guaiFENesin (MUCINEX) 600 MG 12 hr tablet Take by mouth 2 (two) times daily as needed for cough.    Marland Kitchen lisinopril (PRINIVIL,ZESTRIL) 20 MG tablet Take 20 mg by mouth daily.    . metoprolol tartrate (LOPRESSOR) 25 MG tablet TAKE 1/2 TABLET BY MOUTH TWICE DAILY 90 tablet 0  . warfarin (COUMADIN) 4 MG tablet Take 4 mg by mouth as directed. Take by mouth as directed by the coumadin clinic  12   No current facility-administered medications for this visit.    Allergies:   Other    Social History:  The patient  reports that he has never smoked. He has never used smokeless tobacco. He reports that he does not drink alcohol or use illicit drugs.   Family History:  The patient's family history includes Crohn's disease in his sister; Heart disease in his brother; Heart failure in his mother; Kidney failure in his sister; Pneumonia in his mother.    ROS:  Please see the history of present illness.   Otherwise, review of systems are positive for none.   All other systems are reviewed and negative.    PHYSICAL EXAM: VS:  BP 144/76 mmHg  Pulse 58  Ht 5\' 9"  (1.753 m)  Wt 298 lb (135.172 kg)  BMI 43.99 kg/m2 , BMI Body mass index is 43.99 kg/(m^2). GEN: Well nourished, well developed, in no acute distress HEENT: normal Neck: no JVD, carotid bruits, or masses Cardiac: RRR; there is a soft systolic ejection murmur at the base.  A previous echo 5 years ago showed aortic  valve calcified annulus. Respiratory:  clear to auscultation bilaterally, normal work of breathing GI: soft, nontender, nondistended, + BS MS: no deformity or atrophy.  Trace edema and both legs are wrapped with support hose Skin: warm and dry, no rash Neuro:  Strength and sensation are intact Psych: euthymic mood, full affect   EKG:  EKG is ordered today. The ekg ordered today demonstrates sinus bradycardia.  P waves are very low voltage.  Since previous tracing of 11/23/14, no significant change.  There is a lot of baseline artifact on today's tracing.   Recent Labs: No results found for requested labs within last 365 days.    Lipid Panel    Component Value Date/Time   CHOL 125 11/22/2013 1111   TRIG 125.0 11/22/2013 1111   HDL 38.60* 11/22/2013 1111   CHOLHDL 3 11/22/2013 1111   VLDL 25.0 11/22/2013 1111   LDLCALC 61 11/22/2013 1111      Wt Readings from Last 3 Encounters:  01/01/15 298 lb (135.172 kg)  05/24/14 293 lb 6.4 oz (133.085 kg)  11/22/13 293 lb (132.904 kg)         ASSESSMENT AND PLAN:  1. paroxysmal atrial fibrillation, holding normal sinus rhythm on low dose amiodarone and beta blocker 2. essential hypertension 3. Hyperlipidemia 4. chronic kidney disease managed by Dr. Justin Mend. 5.  Monitoring for long-term effects of amiodarone 6.  Chronic sinusitis with nonproductive cough 7.  Systolic heart murmur at base   Current medicines are reviewed at length with the patient today.  The patient does not have concerns regarding medicines.  The following changes have been made:  no change  Labs/ tests ordered today include:   Orders Placed This Encounter  Procedures  . DG Chest 2 View  . TSH  . Basic metabolic panel  . CBC with Differential/Platelet  . Hepatic function panel  . EKG 12-Lead  . ECHOCARDIOGRAM COMPLETE     Disposition: Today we are checking chest x-ray to monitor his amiodarone.  Also to evaluate his cough.  We are checking TSH level  function studies nasal metabolic panel and a CBC to monitor amiodarone. For his cough he will try some over-the-counter plain Mucinex For evaluation of his heart murmur we will get an echocardiogram.  The patient will be rechecked in 6 months for office visit and EKG.  Berna Spare MD 01/01/2015 12:32 PM    [facility] [place], [place], [place]  [address] Phone: [phone]; Fax: [phone]

## 2015-01-01 NOTE — Patient Instructions (Addendum)
Medication Instructions:  TRY MUCINEX PLAIN TWICE A DAY AS NEEDED   Labwork: BMET/CBC/TSH/HFP  Testing/Procedures: Your physician has requested that you have an echocardiogram. Echocardiography is a painless test that uses sound waves to create images of your heart. It provides your doctor with information about the size and shape of your heart and how well your heart's chambers and valves are working. This procedure takes approximately one hour. There are no restrictions for this procedure.  A chest x-ray takes a picture of the organs and structures inside the chest, including the heart, lungs, and blood vessels. This test can show several things, including, whether the heart is enlarges; whether fluid is building up in the lungs; and whether pacemaker / defibrillator leads are still in place. [facility]   Follow-Up: Your physician recommends that you schedule a follow-up appointment in: [place] [facility] [place]

## 2015-01-02 ENCOUNTER — Ambulatory Visit
Admission: RE | Admit: 2015-01-02 | Discharge: 2015-01-02 | Disposition: A | Discharge status: Home / Self Care | Payer: Commercial Managed Care - HMO | Source: Ambulatory Visit | Attending: Cardiology | Admitting: Cardiology

## 2015-01-02 DIAGNOSIS — I4891 Unspecified atrial fibrillation: Secondary | ICD-10-CM | POA: Diagnosis not present

## 2015-01-02 DIAGNOSIS — Z79899 Other long term (current) drug therapy: Secondary | ICD-10-CM

## 2015-01-02 DIAGNOSIS — I517 Cardiomegaly: Secondary | ICD-10-CM | POA: Diagnosis not present

## 2015-01-02 DIAGNOSIS — R011 Cardiac murmur, unspecified: Secondary | ICD-10-CM

## 2015-01-02 DIAGNOSIS — I480 Paroxysmal atrial fibrillation: Secondary | ICD-10-CM

## 2015-01-02 NOTE — Progress Notes (Signed)
Quick Note:  Please report to patient. The recent labs are stable. Continue same medication and careful diet. Thyroid okay. No anemia. Kidney function is better. ______

## 2015-01-12 ENCOUNTER — Ambulatory Visit (HOSPITAL_COMMUNITY): Payer: Commercial Managed Care - HMO | Attending: Internal Medicine

## 2015-01-12 ENCOUNTER — Other Ambulatory Visit: Payer: Self-pay

## 2015-01-12 DIAGNOSIS — I351 Nonrheumatic aortic (valve) insufficiency: Secondary | ICD-10-CM | POA: Insufficient documentation

## 2015-01-12 DIAGNOSIS — I10 Essential (primary) hypertension: Secondary | ICD-10-CM | POA: Diagnosis not present

## 2015-01-12 DIAGNOSIS — R011 Cardiac murmur, unspecified: Secondary | ICD-10-CM | POA: Diagnosis not present

## 2015-01-12 DIAGNOSIS — R010 Benign and innocent cardiac murmurs: Secondary | ICD-10-CM | POA: Diagnosis not present

## 2015-01-12 DIAGNOSIS — I517 Cardiomegaly: Secondary | ICD-10-CM | POA: Diagnosis not present

## 2015-01-12 DIAGNOSIS — E785 Hyperlipidemia, unspecified: Secondary | ICD-10-CM | POA: Diagnosis not present

## 2015-01-12 DIAGNOSIS — I480 Paroxysmal atrial fibrillation: Secondary | ICD-10-CM | POA: Diagnosis not present

## 2015-01-29 DIAGNOSIS — Z7901 Long term (current) use of anticoagulants: Secondary | ICD-10-CM | POA: Diagnosis not present

## 2015-01-29 DIAGNOSIS — I4891 Unspecified atrial fibrillation: Secondary | ICD-10-CM | POA: Diagnosis not present

## 2015-02-20 ENCOUNTER — Other Ambulatory Visit: Payer: Self-pay | Admitting: Cardiology

## 2015-02-26 DIAGNOSIS — I4891 Unspecified atrial fibrillation: Secondary | ICD-10-CM | POA: Diagnosis not present

## 2015-02-26 DIAGNOSIS — Z7901 Long term (current) use of anticoagulants: Secondary | ICD-10-CM | POA: Diagnosis not present

## 2015-03-07 ENCOUNTER — Other Ambulatory Visit: Payer: Self-pay | Admitting: Cardiology

## 2015-03-27 DIAGNOSIS — Z7901 Long term (current) use of anticoagulants: Secondary | ICD-10-CM | POA: Diagnosis not present

## 2015-03-27 DIAGNOSIS — I4891 Unspecified atrial fibrillation: Secondary | ICD-10-CM | POA: Diagnosis not present

## 2015-04-24 DIAGNOSIS — Z7901 Long term (current) use of anticoagulants: Secondary | ICD-10-CM | POA: Diagnosis not present

## 2015-05-21 DIAGNOSIS — I480 Paroxysmal atrial fibrillation: Secondary | ICD-10-CM | POA: Diagnosis not present

## 2015-05-21 DIAGNOSIS — E782 Mixed hyperlipidemia: Secondary | ICD-10-CM | POA: Diagnosis not present

## 2015-05-21 DIAGNOSIS — I10 Essential (primary) hypertension: Secondary | ICD-10-CM | POA: Diagnosis not present

## 2015-05-21 DIAGNOSIS — Z7901 Long term (current) use of anticoagulants: Secondary | ICD-10-CM | POA: Diagnosis not present

## 2015-05-21 DIAGNOSIS — N183 Chronic kidney disease, stage 3 (moderate): Secondary | ICD-10-CM | POA: Diagnosis not present

## 2015-06-18 DIAGNOSIS — Z7901 Long term (current) use of anticoagulants: Secondary | ICD-10-CM | POA: Diagnosis not present

## 2015-06-18 DIAGNOSIS — I4891 Unspecified atrial fibrillation: Secondary | ICD-10-CM | POA: Diagnosis not present

## 2015-06-18 LAB — PROTIME-INR

## 2015-06-29 DIAGNOSIS — N183 Chronic kidney disease, stage 3 (moderate): Secondary | ICD-10-CM | POA: Diagnosis not present

## 2015-06-29 DIAGNOSIS — N2581 Secondary hyperparathyroidism of renal origin: Secondary | ICD-10-CM | POA: Diagnosis not present

## 2015-06-29 DIAGNOSIS — D631 Anemia in chronic kidney disease: Secondary | ICD-10-CM | POA: Diagnosis not present

## 2015-07-03 ENCOUNTER — Encounter: Payer: Self-pay | Admitting: Nurse Practitioner

## 2015-07-03 ENCOUNTER — Telehealth: Payer: Self-pay | Admitting: *Deleted

## 2015-07-03 ENCOUNTER — Ambulatory Visit (INDEPENDENT_AMBULATORY_CARE_PROVIDER_SITE_OTHER): Payer: Commercial Managed Care - HMO | Admitting: Nurse Practitioner

## 2015-07-03 VITALS — BP 130/64 | HR 60 | Ht 69.0 in | Wt 304.1 lb

## 2015-07-03 DIAGNOSIS — I480 Paroxysmal atrial fibrillation: Secondary | ICD-10-CM

## 2015-07-03 DIAGNOSIS — E785 Hyperlipidemia, unspecified: Secondary | ICD-10-CM

## 2015-07-03 DIAGNOSIS — I119 Hypertensive heart disease without heart failure: Secondary | ICD-10-CM

## 2015-07-03 DIAGNOSIS — Z7901 Long term (current) use of anticoagulants: Secondary | ICD-10-CM | POA: Diagnosis not present

## 2015-07-03 NOTE — Telephone Encounter (Signed)
S/w [facility] and are faxing pt's recent lab results to our office today.

## 2015-07-03 NOTE — Progress Notes (Addendum)
CARDIOLOGY OFFICE NOTE  Date:  07/03/2015    Bradley Stokes Date of Birth: [birth year] Medical Record [id number]  PCP:  [name], MD  Cardiologist:  Former patient of Dr. Sherryl Barters.     Chief Complaint  Patient presents with  . Atrial Fibrillation  . Hypertension  . Hyperlipidemia    6 month check - former patient of Dr. Sherryl Barters    History of Present Illness: Bradley Stokes is a 70 y.o. male who presents today for a 6 month check. Former patient of Dr. Sherryl Barters.   He has known PAF, on long term anticoagulation, on AAD therapy with amiodarone, HTN, gout, obesity, CKD and HLD.Marland Kitchen   Last seen back in October - felt to be doing well.  Comes back today. Here alone. Quite talkative. He feels like he is doing well. No chest pain. Breathing is ok as long as he does not do too much. Rhythm is ok. [place] about 3 to 4 months ago going thru the front door - did not get hurt. INR is checked by PCP. He says he has had labs with PCP as well. He is tolerating his medicines. He has no real concerns today.    Past Medical History  Diagnosis Date  . PAF (paroxysmal atrial fibrillation) ([place])   . Edema   . Long-term (current) use of anticoagulants   . Hyperlipidemia   . Hypertension   . Chest pain   . Arthritis     severe arthritis and deformity of his knee's  . Kidney stones   . History of cardiovascular stress test 01/17/2008    EF- 62%  . Obesity     Past Surgical History  Procedure Laterality Date  . Cardiolite stress test      EF 62%     Medications: Current Outpatient Prescriptions  Medication Sig Dispense Refill  . acetaminophen (TYLENOL) 325 MG tablet Take 325 mg by mouth every 6 (six) hours as needed (pain).     Marland Kitchen allopurinol (ZYLOPRIM) 100 MG tablet Take 100 mg by mouth daily.    Marland Kitchen amiodarone (PACERONE) 200 MG tablet TAKE 1/2 TABLET BY MOUTH EVERY DAY 15 tablet 9  . atorvastatin (LIPITOR) 20 MG tablet Take 20 mg by mouth Daily.    . calcitRIOL  (ROCALTROL) 0.25 MCG capsule Take 0.25 mcg by mouth every other day. Take on Mondays, Wednesdays, and Fridays.  12  . furosemide (LASIX) 20 MG tablet TAKE 2 TABLETS BY MOUTH TWICE DAILY 120 tablet 0  . lisinopril (PRINIVIL,ZESTRIL) 20 MG tablet Take 20 mg by mouth daily.    . metoprolol tartrate (LOPRESSOR) 25 MG tablet TAKE 1/2 TABLET BY MOUTH TWICE DAILY 90 tablet 3  . warfarin (COUMADIN) 4 MG tablet Take 4 mg by mouth as directed. Take by mouth as directed by the coumadin clinic  12   No current facility-administered medications for this visit.    Allergies: Allergies  Allergen Reactions  . Other     Tape,needs paper tape    Social History: The patient  reports that he has never smoked. He has never used smokeless tobacco. He reports that he does not drink alcohol or use illicit drugs.   Family History: The patient's family history includes Crohn's disease in his sister; Heart disease in his brother; Heart failure in his mother; Kidney failure in his sister; Pneumonia in his mother.   Review of Systems: Please see the history of present illness.   Otherwise, the review of systems  is positive for none.   All other systems are reviewed and negative.   Physical Exam: VS:  BP 130/64 mmHg  Pulse 60  Ht 5\' 9"  (1.753 m)  Wt 304 lb 1.9 oz (137.948 kg)  BMI 44.89 kg/m2 .  BMI Body mass index is 44.89 kg/(m^2).  Wt Readings from Last 3 Encounters:  07/03/15 304 lb 1.9 oz (137.948 kg)  01/01/15 298 lb (135.172 kg)  05/24/14 293 lb 6.4 oz (133.085 kg)    General: Pleasant. Morbidly obese. Looks older than his stated age. He is alert and in no acute distress.  HEENT: Normal. Neck: Supple, no JVD, carotid bruits, or masses noted.  Cardiac: Regular rate and rhythm. Heart tones are distant. Trace edema. Wearing support stockings. Respiratory:  Lungs are clear to auscultation bilaterally with normal work of breathing.  GI: Soft and nontender.  MS: No deformity or atrophy. Gait and ROM  intact. Skin: Warm and dry. Color is normal.  Neuro:  Strength and sensation are intact and no gross focal deficits noted.  Psych: Alert, appropriate and with normal affect.   LABORATORY DATA:  EKG:  EKG is ordered today. This shows sinus brady - rate of 54.   Lab Results  Component Value Date   WBC 7.9 01/01/2015   HGB 13.0 01/01/2015   HCT 39.7 01/01/2015   PLT 283 01/01/2015   GLUCOSE 101* 01/01/2015   CHOL 125 11/22/2013   TRIG 125.0 11/22/2013   HDL 38.60* 11/22/2013   LDLCALC 61 11/22/2013   ALT 5* 01/01/2015   AST 13 01/01/2015   NA 143 01/01/2015   K 4.1 01/01/2015   CL 104 01/01/2015   CREATININE 1.79* 01/01/2015   BUN 28* 01/01/2015   CO2 28 01/01/2015   TSH 0.695 01/01/2015   INR 2.11* 10/09/2010    BNP (last 3 results) No results for input(s): BNP in the last 8760 hours.  ProBNP (last 3 results) No results for input(s): PROBNP in the last 8760 hours.   Other Studies Reviewed Today:  Echo Study Conclusions from 12/2014  - Left ventricle: The cavity size was normal. Wall thickness was  normal. Systolic function was normal. The estimated ejection  fraction was in the range of 55% to 60%. - Aortic valve: There was mild regurgitation. - Left atrium: The atrium was moderately dilated.   Assessment/Plan: 1. PAF - holding normal sinus rhythm on low dose amiodarone and beta blocker - will try to get copies of his most recent labs by PCP  2. HTN - BP ok on his current regimen  3. Hyperlipidemia - on statin - labs by PCP  4. CKD - he is managed by Dr. Justin Mend.  5. Antiarrhythmic therapy with amiodarone  6. Chronic anticoagulation - followed by PCP   Current medicines are reviewed with the patient today.  The patient does not have concerns regarding medicines other than what has been noted above.  The following changes have been made:  See above.  Labs/ tests ordered today include:    Orders Placed This Encounter  Procedures  . EKG 12-Lead      Disposition:   FU with me in 6 months.    Patient is agreeable to this plan and will call if any problems develop in the interim.   Signed: Burtis Junes, RN, ANP-C 07/03/2015 11:01 AM  [facility] [address] [place], [place]  [address] Phone: [phone] Fax: [phone]

## 2015-07-03 NOTE — Patient Instructions (Addendum)
We will be checking the following labs today - NONE  We will request your most recent labs from Dr. Radene Ou   Medication Instructions:    Continue with your current medicines.     Testing/Procedures To Be Arranged:  N/A  Follow-Up:   See me in 6 months with EKG    Other Special Instructions:   N/A    If you need a refill on your cardiac medications before your next appointment, please call your pharmacy.   Call the [place] office at [phone] if you have any questions, problems or concerns.

## 2015-07-16 DIAGNOSIS — Z7901 Long term (current) use of anticoagulants: Secondary | ICD-10-CM | POA: Diagnosis not present

## 2015-07-16 DIAGNOSIS — I4891 Unspecified atrial fibrillation: Secondary | ICD-10-CM | POA: Diagnosis not present

## 2015-08-07 DIAGNOSIS — Z7901 Long term (current) use of anticoagulants: Secondary | ICD-10-CM | POA: Diagnosis not present

## 2015-08-07 DIAGNOSIS — I4891 Unspecified atrial fibrillation: Secondary | ICD-10-CM | POA: Diagnosis not present

## 2015-09-04 DIAGNOSIS — Z7901 Long term (current) use of anticoagulants: Secondary | ICD-10-CM | POA: Diagnosis not present

## 2015-10-09 DIAGNOSIS — Z7901 Long term (current) use of anticoagulants: Secondary | ICD-10-CM | POA: Diagnosis not present

## 2015-10-09 DIAGNOSIS — I480 Paroxysmal atrial fibrillation: Secondary | ICD-10-CM | POA: Diagnosis not present

## 2015-11-06 DIAGNOSIS — I482 Chronic atrial fibrillation: Secondary | ICD-10-CM | POA: Diagnosis not present

## 2015-11-06 DIAGNOSIS — Z7901 Long term (current) use of anticoagulants: Secondary | ICD-10-CM | POA: Diagnosis not present

## 2015-12-04 DIAGNOSIS — I480 Paroxysmal atrial fibrillation: Secondary | ICD-10-CM | POA: Diagnosis not present

## 2015-12-04 DIAGNOSIS — Z7901 Long term (current) use of anticoagulants: Secondary | ICD-10-CM | POA: Diagnosis not present

## 2015-12-21 ENCOUNTER — Encounter: Payer: Self-pay | Admitting: Nurse Practitioner

## 2016-01-02 ENCOUNTER — Encounter: Payer: Self-pay | Admitting: Nurse Practitioner

## 2016-01-02 DIAGNOSIS — E7800 Pure hypercholesterolemia, unspecified: Secondary | ICD-10-CM | POA: Diagnosis not present

## 2016-01-02 DIAGNOSIS — Z23 Encounter for immunization: Secondary | ICD-10-CM | POA: Diagnosis not present

## 2016-01-02 DIAGNOSIS — I480 Paroxysmal atrial fibrillation: Secondary | ICD-10-CM | POA: Diagnosis not present

## 2016-01-02 DIAGNOSIS — I10 Essential (primary) hypertension: Secondary | ICD-10-CM | POA: Diagnosis not present

## 2016-01-02 DIAGNOSIS — Z7901 Long term (current) use of anticoagulants: Secondary | ICD-10-CM | POA: Diagnosis not present

## 2016-01-02 DIAGNOSIS — M1990 Unspecified osteoarthritis, unspecified site: Secondary | ICD-10-CM | POA: Diagnosis not present

## 2016-01-07 NOTE — Progress Notes (Signed)
CARDIOLOGY OFFICE NOTE  Date:  01/08/2016    Bradley Stokes Date of Birth: Jan 18, 1946 Medical Record #[id number]  PCP:  [name], MD  Cardiologist:  Servando Snare  Chief Complaint  Patient presents with  . Atrial Fibrillation    Follow up visit    History of Present Illness: Bradley Stokes is a 70 y.o. male who presents today for a 6 month check. Former patient of Dr. Sherryl Barters. He has elected to follow with me.   He has known PAF, on long term anticoagulation, on AAD therapy with amiodarone, HTN, gout, obesity, CKD and HLD.  His INR is checked by PCP.   Last seen back in April - felt to be doing well.  Comes back today. Here alone. Quite talkative. Apparently has been at his PCP's office and had labs - trying to get those records. Has been doing ok. No chest pain. Breathing is stable. Seems pretty sedentary but tells me he is taking care of his sister who "is in worse shape than me". Not dizzy. Rhythm ok. Needs medicines refilled. He really has no concerns.    Past Medical History:  Diagnosis Date  . Arthritis    severe arthritis and deformity of his knee's  . Chest pain   . Edema   . History of cardiovascular stress test 01/17/2008   EF- 62%  . Hyperlipidemia   . Hypertension   . Kidney stones   . Long-term (current) use of anticoagulants   . Obesity   . PAF (paroxysmal atrial fibrillation) ([name])     Past Surgical History:  Procedure Laterality Date  . CARDIOLITE STRESS TEST     EF 62%     Medications: Current Outpatient Prescriptions  Medication Sig Dispense Refill  . acetaminophen (TYLENOL) 325 MG tablet Take 325 mg by mouth every 6 (six) hours as needed (pain).     Marland Kitchen allopurinol (ZYLOPRIM) 100 MG tablet Take 100 mg by mouth daily.    Marland Kitchen amiodarone (PACERONE) 200 MG tablet Take 0.5 tablets (100 mg total) by mouth daily. 45 tablet 3  . atorvastatin (LIPITOR) 20 MG tablet Take 20 mg by mouth Daily.    . calcitRIOL (ROCALTROL) 0.25 MCG  capsule Take 0.25 mcg by mouth every other day. Take on Mondays, Wednesdays, and Fridays.  12  . furosemide (LASIX) 20 MG tablet TAKE 2 TABLETS BY MOUTH TWICE DAILY 120 tablet 0  . lisinopril (PRINIVIL,ZESTRIL) 20 MG tablet Take 20 mg by mouth daily.    . metoprolol tartrate (LOPRESSOR) 25 MG tablet Take 0.5 tablets (12.5 mg total) by mouth 2 (two) times daily. 90 tablet 3  . warfarin (COUMADIN) 4 MG tablet Take 4 mg by mouth as directed. Take by mouth as directed by the coumadin clinic  12   No current facility-administered medications for this visit.     Allergies: Allergies  Allergen Reactions  . Other     Tape,needs paper tape    Social History: The patient  reports that he has never smoked. He has never used smokeless tobacco. He reports that he does not drink alcohol or use drugs.   Family History: The patient's family history includes Crohn's disease in his sister; Heart disease in his brother; Heart failure in his mother; Kidney failure in his sister; Pneumonia in his mother.   Review of Systems: Please see the history of present illness.   Otherwise, the review of systems is positive for none.   All other systems are reviewed  and negative.   Physical Exam: VS:  BP (!) 118/56   Pulse 62   Ht 5' 9.5" (1.765 m)   Wt 300 lb (136.1 kg)   BMI 43.67 kg/m  .  BMI Body mass index is 43.67 kg/m.  Wt Readings from Last 3 Encounters:  01/08/16 300 lb (136.1 kg)  07/03/15 (!) 304 lb 1.9 oz (137.9 kg)  01/01/15 298 lb (135.2 kg)    General: Pleasant. Morbidly obese. He is quite talkative. He is alert and in no acute distress.   HEENT: Normal.  Neck: Supple, no JVD, carotid bruits, or masses noted.  Cardiac: Regular rate and rhythm. Heart tones are distant. Legs are full - both are with support stockings Respiratory:  Lungs are fairly clear to auscultation bilaterally with normal work of breathing.  GI: Soft and nontender.  MS: No deformity or atrophy. Gait not tested. He was  placed in a wheelchair.  Skin: Warm and dry. Color is normal.  Neuro:  Strength and sensation are intact and no gross focal deficits noted.  Psych: Alert, appropriate and with normal affect.   LABORATORY DATA:  EKG:  EKG is ordered today. This demonstrates NSR today. His HR is 62 today.   Lab Results  Component Value Date   WBC 7.9 01/01/2015   HGB 13.0 01/01/2015   HCT 39.7 01/01/2015   PLT 283 01/01/2015   GLUCOSE 101 (H) 01/01/2015   CHOL 125 11/22/2013   TRIG 125.0 11/22/2013   HDL 38.60 (L) 11/22/2013   LDLCALC 61 11/22/2013   ALT 5 (L) 01/01/2015   AST 13 01/01/2015   NA 143 01/01/2015   K 4.1 01/01/2015   CL 104 01/01/2015   CREATININE 1.79 (H) 01/01/2015   BUN 28 (H) 01/01/2015   CO2 28 01/01/2015   TSH 0.695 01/01/2015   INR 2.11 (H) 10/09/2010    BNP (last 3 results) No results for input(s): BNP in the last 8760 hours.  ProBNP (last 3 results) No results for input(s): PROBNP in the last 8760 hours.   Other Studies Reviewed Today:  Echo Study Conclusions from 12/2014  - Left ventricle: The cavity size was normal. Wall thickness was  normal. Systolic function was normal. The estimated ejection  fraction was in the range of 55% to 60%. - Aortic valve: There was mild regurgitation. - Left atrium: The atrium was moderately dilated.   Assessment/Plan: 1. PAF - holding normal sinus rhythm on low dose amiodarone and beta blocker - will try to get copies of his most recent labs by PCP - has just had all of his surveillance labs done. Consider CXR on return.   2. HTN - BP ok on his current regimen  3. Hyperlipidemia - on statin - labs by PCP  4. CKD - he is managed by Dr. Justin Mend.  5. Antiarrhythmic therapy with amiodarone - remains in NSR.   6. Chronic anticoagulation - followed by PCP   Current medicines are reviewed with the patient today.  The patient does not have concerns regarding medicines other than what has been noted above.  The  following changes have been made:  See above.  Labs/ tests ordered today include:    Orders Placed This Encounter  Procedures  . EKG 12-Lead     Disposition:   FU with me in 6 months with EKG.   Patient is agreeable to this plan and will call if any problems develop in the interim.   Signed: Burtis Junes, RN, ANP-C 01/08/2016  10:34 AM  [facility] [address] [place], [place]  [address] Phone: [phone] Fax: [phone]

## 2016-01-08 ENCOUNTER — Ambulatory Visit (INDEPENDENT_AMBULATORY_CARE_PROVIDER_SITE_OTHER): Payer: Commercial Managed Care - HMO | Admitting: Nurse Practitioner

## 2016-01-08 ENCOUNTER — Telehealth: Payer: Self-pay | Admitting: *Deleted

## 2016-01-08 ENCOUNTER — Encounter: Payer: Self-pay | Admitting: Nurse Practitioner

## 2016-01-08 VITALS — BP 118/56 | HR 62 | Ht 69.5 in | Wt 300.0 lb

## 2016-01-08 DIAGNOSIS — I480 Paroxysmal atrial fibrillation: Secondary | ICD-10-CM

## 2016-01-08 DIAGNOSIS — E7800 Pure hypercholesterolemia, unspecified: Secondary | ICD-10-CM | POA: Diagnosis not present

## 2016-01-08 DIAGNOSIS — Z7901 Long term (current) use of anticoagulants: Secondary | ICD-10-CM

## 2016-01-08 DIAGNOSIS — I119 Hypertensive heart disease without heart failure: Secondary | ICD-10-CM

## 2016-01-08 MED ORDER — AMIODARONE HCL 200 MG PO TABS: 100.0000 mg | ORAL_TABLET | Freq: Every day | ORAL | 3 refills | Status: DC

## 2016-01-08 MED ORDER — METOPROLOL TARTRATE 25 MG PO TABS: 12.5000 mg | ORAL_TABLET | Freq: Two times a day (BID) | ORAL | 3 refills | Status: DC

## 2016-01-08 NOTE — Patient Instructions (Addendum)
We will be checking the following labs today - NONE   Medication Instructions:    Continue with your current medicines.     Testing/Procedures To Be Arranged:  N/A  Follow-Up:   See me in 6 months    Other Special Instructions:   We will get a copy of your labs from Dr. Radene Ou    If you need a refill on your cardiac medications before your next appointment, please call your pharmacy.   Call the [place] office at [phone] if you have any questions, problems or concerns.

## 2016-01-08 NOTE — Telephone Encounter (Signed)
Called Dr. Radene Ou office and s/w tammy to fax over cbc/ptt/inr/cmet/tsh/lipid.

## 2016-01-30 DIAGNOSIS — I480 Paroxysmal atrial fibrillation: Secondary | ICD-10-CM | POA: Diagnosis not present

## 2016-01-30 DIAGNOSIS — Z7901 Long term (current) use of anticoagulants: Secondary | ICD-10-CM | POA: Diagnosis not present

## 2016-03-04 DIAGNOSIS — Z7901 Long term (current) use of anticoagulants: Secondary | ICD-10-CM | POA: Diagnosis not present

## 2016-03-04 DIAGNOSIS — I480 Paroxysmal atrial fibrillation: Secondary | ICD-10-CM | POA: Diagnosis not present

## 2016-03-11 DIAGNOSIS — Z7901 Long term (current) use of anticoagulants: Secondary | ICD-10-CM | POA: Diagnosis not present

## 2016-03-12 ENCOUNTER — Ambulatory Visit: Payer: Commercial Managed Care - HMO | Admitting: Interventional Cardiology

## 2016-03-14 DIAGNOSIS — I480 Paroxysmal atrial fibrillation: Secondary | ICD-10-CM | POA: Diagnosis not present

## 2016-03-14 DIAGNOSIS — Z7901 Long term (current) use of anticoagulants: Secondary | ICD-10-CM | POA: Diagnosis not present

## 2016-03-19 DIAGNOSIS — I480 Paroxysmal atrial fibrillation: Secondary | ICD-10-CM | POA: Diagnosis not present

## 2016-03-19 DIAGNOSIS — Z7901 Long term (current) use of anticoagulants: Secondary | ICD-10-CM | POA: Diagnosis not present

## 2016-04-22 DIAGNOSIS — I8310 Varicose veins of unspecified lower extremity with inflammation: Secondary | ICD-10-CM | POA: Diagnosis not present

## 2016-04-22 DIAGNOSIS — Z7901 Long term (current) use of anticoagulants: Secondary | ICD-10-CM | POA: Diagnosis not present

## 2016-04-22 DIAGNOSIS — L609 Nail disorder, unspecified: Secondary | ICD-10-CM | POA: Diagnosis not present

## 2016-04-22 DIAGNOSIS — I480 Paroxysmal atrial fibrillation: Secondary | ICD-10-CM | POA: Diagnosis not present

## 2016-04-25 ENCOUNTER — Ambulatory Visit (INDEPENDENT_AMBULATORY_CARE_PROVIDER_SITE_OTHER): Payer: Medicare HMO | Admitting: Podiatry

## 2016-04-25 ENCOUNTER — Encounter: Payer: Self-pay | Admitting: Podiatry

## 2016-04-25 DIAGNOSIS — I878 Other specified disorders of veins: Secondary | ICD-10-CM

## 2016-04-25 DIAGNOSIS — M79674 Pain in right toe(s): Secondary | ICD-10-CM | POA: Diagnosis not present

## 2016-04-25 DIAGNOSIS — B351 Tinea unguium: Secondary | ICD-10-CM

## 2016-04-25 DIAGNOSIS — M79675 Pain in left toe(s): Secondary | ICD-10-CM | POA: Diagnosis not present

## 2016-04-25 MED ORDER — CEPHALEXIN 500 MG PO CAPS: 500.0000 mg | ORAL_CAPSULE | Freq: Three times a day (TID) | ORAL | 0 refills | Status: DC

## 2016-04-25 NOTE — Progress Notes (Addendum)
   Subjective:    Patient ID: Bradley Stokes, male    DOB: 1945-05-09, 71 y.o.   MRN: [id number]  HPI  71 year old male presents the office they for concerns of thick, painful, elongated toenails that he cannot trim himself. He denies any redness or drainage coming from the area. He states that he has arrested top his left foot. He is using Lac-Hydrin for his legs and is asking if he continues his on his foot. Denies any open sores or any drainage. He has chronic lymphedema changes to both of his legs.   Review of Systems  All other systems reviewed and are negative.      Objective:   Physical Exam General: AAO x3, NAD  Dermatological: Nails are hypertrophic, dystrophic, brittle, discolored, elongated 10. No surrounding redness or drainage. Tenderness nails 1-5 bilaterally.Significant chronic lipedema changes of present bilaterally. On the dorsal aspect left foot there is also changes due to swelling. There is no open sores identified there is no drainage. There is chronic swelling to bilateral lower extremities.   Vascular: Dorsalis Pedis artery and Posterior Tibial artery pedal pulses are 1/4 bilateral with immedate capillary fill time. There is chronic swelling to he ankles/legs.  There is no pain with calf compression, swelling, warmth, erythema.   Neruologic: Sensation decreased with Derrel Nip monofilament.  Musculoskeletal: No gross boney pedal deformities bilateral. No pain, crepitus, or limitation noted with foot and ankle range of motion bilateral. Muscular strength 5/5 in all groups tested bilateral.     Assessment & Plan:  71 year old male with symptomatic onychomycosis, chronic lipedema changes bilaterally -Treatment options discussed including all alternatives, risks, and complications -Etiology of symptoms were discussed -Nails debrided 10 without complications or bleeding. -Continue Lac-Hydrin for the legs and he continues from the dorsal aspect of left foot as  well. This is not improved in the next couple weeks or sooner if any issues are to arise. -Discussed daily foot inspection. -Follow-up in 2 weeks to check the rash on his left foot or sooner if any problems arise. In the meantime, encouraged to call the office with any questions, concerns, change in symptoms.   Celesta Gentile, DPM

## 2016-05-09 ENCOUNTER — Ambulatory Visit (INDEPENDENT_AMBULATORY_CARE_PROVIDER_SITE_OTHER): Payer: Medicare HMO | Admitting: Podiatry

## 2016-05-09 ENCOUNTER — Encounter: Payer: Self-pay | Admitting: Podiatry

## 2016-05-09 DIAGNOSIS — I878 Other specified disorders of veins: Secondary | ICD-10-CM | POA: Diagnosis not present

## 2016-05-09 DIAGNOSIS — I872 Venous insufficiency (chronic) (peripheral): Secondary | ICD-10-CM | POA: Diagnosis not present

## 2016-05-09 MED ORDER — DESOXIMETASONE 0.25 % EX CREA: 1.0000 "application " | TOPICAL_CREAM | Freq: Two times a day (BID) | CUTANEOUS | 0 refills | Status: DC

## 2016-05-12 NOTE — Progress Notes (Signed)
Subjective: 71 year old male presents the office today for concerns and follow-up evaluation of left foot rash. He states that he was using the Lac-Hydrin his foot is what started become even more since stopping this the rash has dissipated but he is still present and itches at times. He denies any open sores at this time. Denies any systemic complaints such as fevers, chills, nausea, vomiting. No acute changes since last appointment, and no other complaints at this time.   Objective: AAO x3, NAD DP/PT pulses palpable bilaterally, CRT less than 3 seconds Significant lymphedema changes present bilaterally. On the dorsal aspect of the left foot faint erythematous rash is still present which subjectively does itch. There is no open sore and is no drainage or weakness of the skin. No open lesions or pre-ulcerative lesions.  No pain with calf compression, swelling, warmth, erythema  Assessment: Rash left foot  Plan: -All treatment options discussed with the patient including all alternatives, risks, complications.  -Prescribed Topicort to use for the next 2 weeks. If after 2 weeks this is not resolved to call the office for follow-up. -Continue compression and elevation of his legs -RTC in 3 months for routine care or sooner if needed -Patient encouraged to call the office with any questions, concerns, change in symptoms.   Celesta Gentile, DPM

## 2016-05-20 DIAGNOSIS — Z7901 Long term (current) use of anticoagulants: Secondary | ICD-10-CM | POA: Diagnosis not present

## 2016-05-20 DIAGNOSIS — I480 Paroxysmal atrial fibrillation: Secondary | ICD-10-CM | POA: Diagnosis not present

## 2016-06-17 DIAGNOSIS — I480 Paroxysmal atrial fibrillation: Secondary | ICD-10-CM | POA: Diagnosis not present

## 2016-06-17 DIAGNOSIS — N183 Chronic kidney disease, stage 3 (moderate): Secondary | ICD-10-CM | POA: Diagnosis not present

## 2016-06-17 DIAGNOSIS — Z7901 Long term (current) use of anticoagulants: Secondary | ICD-10-CM | POA: Diagnosis not present

## 2016-06-19 DIAGNOSIS — N183 Chronic kidney disease, stage 3 (moderate): Secondary | ICD-10-CM | POA: Diagnosis not present

## 2016-06-19 DIAGNOSIS — Z6841 Body Mass Index (BMI) 40.0 and over, adult: Secondary | ICD-10-CM | POA: Diagnosis not present

## 2016-06-19 DIAGNOSIS — D631 Anemia in chronic kidney disease: Secondary | ICD-10-CM | POA: Diagnosis not present

## 2016-06-19 DIAGNOSIS — N2581 Secondary hyperparathyroidism of renal origin: Secondary | ICD-10-CM | POA: Diagnosis not present

## 2016-06-25 ENCOUNTER — Encounter: Payer: Self-pay | Admitting: Nurse Practitioner

## 2016-07-07 NOTE — Progress Notes (Signed)
CARDIOLOGY OFFICE NOTE  Date:  07/08/2016    Bradley Stokes Date of Birth: [birth year] Medical Record #[id number]  PCP:  [name], MD  Cardiologist:  Servando Snare   Chief Complaint  Patient presents with  . Atrial Fibrillation    Follow up visit    History of Present Illness: Bradley Stokes is a 71 y.o. male who presents today for a 6 month check. Former patient of Dr. Sherryl Barters. He has elected to follow with me.   He has known PAF, on long term anticoagulation, on AAD therapy with amiodarone, HTN, gout, morbid obesity, CKD and HLD.  His INR is checked by PCP.   Last seen back in October - felt to be doing well.  Comes back today. Here alone. Quite talkative. Seems to be holding his own. Hard to walk due to leg deformity. Using a cane. No chest pain. Not short of breath. INR monitored by Dr. Radene Ou. His dose has been cut back per his report. No palpitations. He is ok with how he is doing and seems to be holding his own. Not dizzy. No falls.   Past Medical History:  Diagnosis Date  . Arthritis    severe arthritis and deformity of his knee's  . Chest pain   . Edema   . History of cardiovascular stress test 01/17/2008   EF- 62%  . Hyperlipidemia   . Hypertension   . Kidney stones   . Long-term (current) use of anticoagulants   . Obesity   . PAF (paroxysmal atrial fibrillation) ([place])     Past Surgical History:  Procedure Laterality Date  . CARDIOLITE STRESS TEST     EF 62%     Medications: Current Outpatient Prescriptions  Medication Sig Dispense Refill  . acetaminophen (TYLENOL) 325 MG tablet Take 325 mg by mouth every 6 (six) hours as needed (pain).     Marland Kitchen allopurinol (ZYLOPRIM) 100 MG tablet Take 100 mg by mouth daily.    Marland Kitchen amiodarone (PACERONE) 200 MG tablet Take 0.5 tablets (100 mg total) by mouth daily. 45 tablet 3  . ammonium lactate (LAC-HYDRIN) 12 % lotion Apply 1 application topically 2 (two) times daily.    Marland Kitchen atorvastatin (LIPITOR) 20  MG tablet Take 20 mg by mouth Daily.    . calcitRIOL (ROCALTROL) 0.25 MCG capsule Take 0.25 mcg by mouth every other day. Take on Mondays, Wednesdays, and Fridays.  12  . desoximetasone (TOPICORT) 0.25 % cream Apply 1 application topically 2 (two) times daily. 30 g 0  . furosemide (LASIX) 20 MG tablet TAKE 2 TABLETS BY MOUTH TWICE DAILY 120 tablet 0  . lisinopril (PRINIVIL,ZESTRIL) 20 MG tablet Take 20 mg by mouth daily.    . metoprolol tartrate (LOPRESSOR) 25 MG tablet Take 0.5 tablets (12.5 mg total) by mouth 2 (two) times daily. 90 tablet 3  . warfarin (COUMADIN) 2 MG tablet TK 1 AND 1/2 TS PO ONCE A DAY  0   No current facility-administered medications for this visit.     Allergies: Allergies  Allergen Reactions  . Other     Tape,needs paper tape    Social History: The patient  reports that he has never smoked. He has never used smokeless tobacco. He reports that he does not drink alcohol or use drugs.   Family History: The patient's family history includes Crohn's disease in his sister; Heart disease in his brother; Heart failure in his mother; Kidney failure in his sister; Pneumonia in his mother.  Review of Systems: Please see the history of present illness.   Otherwise, the review of systems is positive for none.   All other systems are reviewed and negative.   Physical Exam: VS:  BP 120/70   Pulse (!) 57   Ht 5\' 9"  (1.753 m)   Wt (!) 300 lb 12.8 oz (136.4 kg)   BMI 44.42 kg/m  .  BMI Body mass index is 44.42 kg/m.  Wt Readings from Last 3 Encounters:  07/08/16 (!) 300 lb 12.8 oz (136.4 kg)  01/08/16 300 lb (136.1 kg)  07/03/15 (!) 304 lb 1.9 oz (137.9 kg)    General: Pleasant. Elderly male. Morbidly obese. He is alert, very talkative and in no acute distress.   HEENT: Normal.  Neck: Supple, no JVD, carotid bruits, or masses noted.  Cardiac: Regular rate and rhythm. Heart tones are distant. No edema.  Respiratory:  Lungs are fairly clear to auscultation  bilaterally with normal work of breathing.  GI: Soft and nontender. Obese.  MS: No deformity or atrophy. Gait and ROM intact. Difficult for him to walk. Using a cane.  Skin: Warm and dry. Color is normal.  Neuro:  Strength and sensation are intact and no gross focal deficits noted.  Psych: Alert, appropriate and with normal affect.   LABORATORY DATA:  EKG:  EKG is ordered today. This demonstrates NSR with 1st degree AV block.  Lab Results  Component Value Date   WBC 7.9 01/01/2015   HGB 13.0 01/01/2015   HCT 39.7 01/01/2015   PLT 283 01/01/2015   GLUCOSE 101 (H) 01/01/2015   CHOL 125 11/22/2013   TRIG 125.0 11/22/2013   HDL 38.60 (L) 11/22/2013   LDLCALC 61 11/22/2013   ALT 5 (L) 01/01/2015   AST 13 01/01/2015   NA 143 01/01/2015   K 4.1 01/01/2015   CL 104 01/01/2015   CREATININE 1.79 (H) 01/01/2015   BUN 28 (H) 01/01/2015   CO2 28 01/01/2015   TSH 0.695 01/01/2015   INR 2.11 (H) 10/09/2010    BNP (last 3 results) No results for input(s): BNP in the last 8760 hours.  ProBNP (last 3 results) No results for input(s): PROBNP in the last 8760 hours.   Other Studies Reviewed Today:  Echo Study Conclusions from 12/2014  - Left ventricle: The cavity size was normal. Wall thickness was  normal. Systolic function was normal. The estimated ejection  fraction was in the range of 55% to 60%. - Aortic valve: There was mild regurgitation. - Left atrium: The atrium was moderately dilated.   Assessment/Plan:  1. PAF - holding normal sinus rhythm on low dose amiodarone and beta blocker - surveillance labs today. Try to arrange for CXR as well. .   2. HTN - BP ok on his current regimen  3. Hyperlipidemia - on statin - labs by PCP  4. CKD - he is managed by Dr. Justin Mend.  5. Antiarrhythmic therapy with amiodarone - remains in NSR.   6. Chronic anticoagulation - followed by PCP - no problems noted.   Current medicines are reviewed with the patient today.  The  patient does not have concerns regarding medicines other than what has been noted above.  The following changes have been made:  See above.  Labs/ tests ordered today include:    Orders Placed This Encounter  Procedures  . DG Chest 2 View  . Basic metabolic panel  . CBC  . Hepatic function panel  . TSH  . EKG 12-Lead  Disposition:   FU with me in 6 months with EKG.   Patient is agreeable to this plan and will call if any problems develop in the interim.   SignedTruitt Merle, NP  07/08/2016 10:47 AM  [facility] [address] [place], [place]  [address] Phone: [phone] Fax: [phone]

## 2016-07-08 ENCOUNTER — Encounter: Payer: Self-pay | Admitting: Nurse Practitioner

## 2016-07-08 ENCOUNTER — Ambulatory Visit (INDEPENDENT_AMBULATORY_CARE_PROVIDER_SITE_OTHER): Payer: Medicare HMO | Admitting: Nurse Practitioner

## 2016-07-08 ENCOUNTER — Encounter (INDEPENDENT_AMBULATORY_CARE_PROVIDER_SITE_OTHER): Payer: Self-pay

## 2016-07-08 VITALS — BP 120/70 | HR 57 | Ht 69.0 in | Wt 300.8 lb

## 2016-07-08 DIAGNOSIS — I119 Hypertensive heart disease without heart failure: Secondary | ICD-10-CM | POA: Diagnosis not present

## 2016-07-08 DIAGNOSIS — I480 Paroxysmal atrial fibrillation: Secondary | ICD-10-CM

## 2016-07-08 DIAGNOSIS — Z7901 Long term (current) use of anticoagulants: Secondary | ICD-10-CM

## 2016-07-08 DIAGNOSIS — Z79899 Other long term (current) drug therapy: Secondary | ICD-10-CM

## 2016-07-08 LAB — BASIC METABOLIC PANEL
BUN/Creatinine Ratio: 14 (ref 10–24)
BUN: 29 mg/dL — ABNORMAL HIGH (ref 8–27)
CO2: 22 mmol/L (ref 18–29)
Calcium: 9 mg/dL (ref 8.6–10.2)
Chloride: 104 mmol/L (ref 96–106)
Creatinine, Ser: 2.07 mg/dL — ABNORMAL HIGH (ref 0.76–1.27)
GFR calc Af Amer: 36 mL/min/{1.73_m2} — ABNORMAL LOW (ref >59)
GFR calc non Af Amer: 31 mL/min/{1.73_m2} — ABNORMAL LOW (ref >59)
Glucose: 99 mg/dL (ref 65–99)
Potassium: 4.5 mmol/L (ref 3.5–5.2)
Sodium: 144 mmol/L (ref 134–144)

## 2016-07-08 LAB — CBC
Hematocrit: 38.7 % (ref 37.5–51.0)
Hemoglobin: 13 g/dL (ref 13.0–17.7)
MCH: 32.3 pg (ref 26.6–33.0)
MCHC: 33.6 g/dL (ref 31.5–35.7)
MCV: 96 fL (ref 79–97)
Platelets: 270 10*3/uL (ref 150–379)
RBC: 4.02 x10E6/uL — ABNORMAL LOW (ref 4.14–5.80)
RDW: 13.6 % (ref 12.3–15.4)
WBC: 9.7 10*3/uL (ref 3.4–10.8)

## 2016-07-08 LAB — HEPATIC FUNCTION PANEL
ALT: 7 IU/L (ref 0–44)
AST: 12 IU/L (ref 0–40)
Albumin: 3.8 g/dL (ref 3.5–4.8)
Alkaline Phosphatase: 155 IU/L — ABNORMAL HIGH (ref 39–117)
Bilirubin Total: 0.6 mg/dL (ref 0.0–1.2)
Bilirubin, Direct: 0.19 mg/dL (ref 0.00–0.40)
Total Protein: 6.8 g/dL (ref 6.0–8.5)

## 2016-07-08 LAB — TSH: TSH: 1.04 u[IU]/mL (ref 0.450–4.500)

## 2016-07-08 NOTE — Patient Instructions (Addendum)
We will be checking the following labs today - BMET, CBC, HPF and TSH  Please go to [facility] to [place] on the first floor for a chest Xray - you may walk in.     Medication Instructions:    Continue with your current medicines.     Testing/Procedures To Be Arranged:  N/A  Follow-Up:   See me in 6 months with EKG    Other Special Instructions:   N/A    If you need a refill on your cardiac medications before your next appointment, please call your pharmacy.   Call the [place] office at [phone] if you have any questions, problems or concerns.

## 2016-07-10 ENCOUNTER — Ambulatory Visit
Admission: RE | Admit: 2016-07-10 | Discharge: 2016-07-10 | Disposition: A | Discharge status: Home / Self Care | Payer: Medicare HMO | Source: Ambulatory Visit | Attending: Nurse Practitioner | Admitting: Nurse Practitioner

## 2016-07-10 DIAGNOSIS — I480 Paroxysmal atrial fibrillation: Secondary | ICD-10-CM

## 2016-07-10 DIAGNOSIS — I10 Essential (primary) hypertension: Secondary | ICD-10-CM | POA: Diagnosis not present

## 2016-07-10 DIAGNOSIS — Z79899 Other long term (current) drug therapy: Secondary | ICD-10-CM

## 2016-07-15 DIAGNOSIS — I480 Paroxysmal atrial fibrillation: Secondary | ICD-10-CM | POA: Diagnosis not present

## 2016-07-15 DIAGNOSIS — Z7901 Long term (current) use of anticoagulants: Secondary | ICD-10-CM | POA: Diagnosis not present

## 2016-08-13 DIAGNOSIS — N2581 Secondary hyperparathyroidism of renal origin: Secondary | ICD-10-CM | POA: Diagnosis not present

## 2016-08-15 ENCOUNTER — Ambulatory Visit (INDEPENDENT_AMBULATORY_CARE_PROVIDER_SITE_OTHER): Payer: Medicare HMO | Admitting: Podiatry

## 2016-08-15 ENCOUNTER — Encounter: Payer: Self-pay | Admitting: Podiatry

## 2016-08-15 DIAGNOSIS — B351 Tinea unguium: Secondary | ICD-10-CM | POA: Diagnosis not present

## 2016-08-15 DIAGNOSIS — M79674 Pain in right toe(s): Secondary | ICD-10-CM

## 2016-08-15 DIAGNOSIS — M79675 Pain in left toe(s): Secondary | ICD-10-CM | POA: Diagnosis not present

## 2016-08-18 NOTE — Progress Notes (Signed)
Subjective: 71 y.o. returns the office today for painful, elongated, thickened toenails which they cannot trim themself. Denies any redness or drainage around the nails. He states that he gets a lot of bleeding from the toenail at times with shoes. He has not been drawn trim toenails. He states. Denies any acute changes since last appointment and no new complaints today. Denies any systemic complaints such as fevers, chills, nausea, vomiting.   Objective: AAO 3, NAD DP/PT pulses palpable, CRT less than 3 seconds  Nails hypertrophic, dystrophic, elongated, brittle, discolored 10. There is tenderness overlying the nails 1-5 bilaterally. There is no surrounding erythema or drainage along the nail sites. There is evidence of dried blood along most the toenails after debridement there is no underlying wound present. There is no active bleeding present. There is no erythema or clinical signs of infection. No open lesions or pre-ulcerative lesions are identified. Chronic venous changes present bilaterally.  No other areas of tenderness bilateral lower extremities. No overlying edema, erythema, increased warmth. No pain with calf compression, swelling, warmth, erythema.  Assessment: Patient presents with symptomatic onychomycosis  Plan: -Treatment options including alternatives, risks, complications were discussed -Nails sharply debrided 10 without complication/bleeding. -Discussed daily foot inspection. If there are any changes, to call the office immediately.  -Follow-up in 3 months or sooner if any problems are to arise. In the meantime, encouraged to call the office with any questions, concerns, changes symptoms.  Celesta Gentile, DPM

## 2016-08-19 DIAGNOSIS — I480 Paroxysmal atrial fibrillation: Secondary | ICD-10-CM | POA: Diagnosis not present

## 2016-08-19 DIAGNOSIS — Z7901 Long term (current) use of anticoagulants: Secondary | ICD-10-CM | POA: Diagnosis not present

## 2016-09-16 DIAGNOSIS — I480 Paroxysmal atrial fibrillation: Secondary | ICD-10-CM | POA: Diagnosis not present

## 2016-09-16 DIAGNOSIS — Z7901 Long term (current) use of anticoagulants: Secondary | ICD-10-CM | POA: Diagnosis not present

## 2016-10-14 DIAGNOSIS — I480 Paroxysmal atrial fibrillation: Secondary | ICD-10-CM | POA: Diagnosis not present

## 2016-10-14 DIAGNOSIS — Z7901 Long term (current) use of anticoagulants: Secondary | ICD-10-CM | POA: Diagnosis not present

## 2016-11-11 DIAGNOSIS — Z7901 Long term (current) use of anticoagulants: Secondary | ICD-10-CM | POA: Diagnosis not present

## 2016-11-11 DIAGNOSIS — I480 Paroxysmal atrial fibrillation: Secondary | ICD-10-CM | POA: Diagnosis not present

## 2016-11-20 ENCOUNTER — Ambulatory Visit (INDEPENDENT_AMBULATORY_CARE_PROVIDER_SITE_OTHER): Payer: Medicare HMO | Admitting: Podiatry

## 2016-11-20 ENCOUNTER — Encounter: Payer: Self-pay | Admitting: Podiatry

## 2016-11-20 DIAGNOSIS — M79675 Pain in left toe(s): Secondary | ICD-10-CM | POA: Diagnosis not present

## 2016-11-20 DIAGNOSIS — M79674 Pain in right toe(s): Secondary | ICD-10-CM

## 2016-11-20 DIAGNOSIS — B351 Tinea unguium: Secondary | ICD-10-CM

## 2016-11-20 DIAGNOSIS — Z9229 Personal history of other drug therapy: Secondary | ICD-10-CM

## 2016-11-21 NOTE — Progress Notes (Signed)
Subjective: 71 y.o. returns the office today for painful, elongated, thickened toenails which they cannot trim themself. Denies any redness or drainage around the nails. He states his nails are doing better since coming but still get long and painful and as he is on coumadin they bleed when he trims them. Denies any acute changes since last appointment and no new complaints today. Denies any systemic complaints such as fevers, chills, nausea, vomiting.   Objective: AAO 3, NAD DP/PT pulses palpable, CRT less than 3 seconds  Nails hypertrophic, dystrophic, elongated, brittle, discolored 10. There is tenderness overlying the nails 1-5 bilaterally. There is no surrounding erythema or drainage along the nail sites. There is evidence of dried blood along the right hallux toenail after debridement there is no underlying wound present. There is no active bleeding present. There is no erythema or clinical signs of infection. No open lesions or pre-ulcerative lesions are identified. Chronic venous changes present bilaterally.  No other areas of tenderness bilateral lower extremities. No overlying edema, erythema, increased warmth. No pain with calf compression, swelling, warmth, erythema.  Assessment: Patient presents with symptomatic onychomycosis  Plan: -Treatment options including alternatives, risks, complications were discussed -Nails sharply debrided 10 without complication/bleeding. -Discussed daily foot inspection. If there are any changes, to call the office immediately.  -Follow-up in 3 months or sooner if any problems are to arise. In the meantime, encouraged to call the office with any questions, concerns, changes symptoms.  Celesta Gentile, DPM

## 2016-12-09 DIAGNOSIS — I480 Paroxysmal atrial fibrillation: Secondary | ICD-10-CM | POA: Diagnosis not present

## 2016-12-09 DIAGNOSIS — Z7901 Long term (current) use of anticoagulants: Secondary | ICD-10-CM | POA: Diagnosis not present

## 2016-12-11 DIAGNOSIS — Z7901 Long term (current) use of anticoagulants: Secondary | ICD-10-CM | POA: Diagnosis not present

## 2016-12-11 DIAGNOSIS — I480 Paroxysmal atrial fibrillation: Secondary | ICD-10-CM | POA: Diagnosis not present

## 2016-12-13 ENCOUNTER — Other Ambulatory Visit: Payer: Self-pay | Admitting: Nurse Practitioner

## 2016-12-17 DIAGNOSIS — Z7901 Long term (current) use of anticoagulants: Secondary | ICD-10-CM | POA: Diagnosis not present

## 2016-12-17 DIAGNOSIS — I480 Paroxysmal atrial fibrillation: Secondary | ICD-10-CM | POA: Diagnosis not present

## 2017-01-13 ENCOUNTER — Ambulatory Visit: Payer: Medicare HMO | Admitting: Nurse Practitioner

## 2017-01-20 DIAGNOSIS — R791 Abnormal coagulation profile: Secondary | ICD-10-CM | POA: Diagnosis not present

## 2017-01-20 DIAGNOSIS — Z7901 Long term (current) use of anticoagulants: Secondary | ICD-10-CM | POA: Diagnosis not present

## 2017-01-21 DIAGNOSIS — Z7901 Long term (current) use of anticoagulants: Secondary | ICD-10-CM | POA: Diagnosis not present

## 2017-01-22 DIAGNOSIS — Z7901 Long term (current) use of anticoagulants: Secondary | ICD-10-CM | POA: Diagnosis not present

## 2017-01-22 DIAGNOSIS — R791 Abnormal coagulation profile: Secondary | ICD-10-CM | POA: Diagnosis not present

## 2017-01-22 DIAGNOSIS — I480 Paroxysmal atrial fibrillation: Secondary | ICD-10-CM | POA: Diagnosis not present

## 2017-01-23 DIAGNOSIS — I480 Paroxysmal atrial fibrillation: Secondary | ICD-10-CM | POA: Diagnosis not present

## 2017-01-23 DIAGNOSIS — Z7901 Long term (current) use of anticoagulants: Secondary | ICD-10-CM | POA: Diagnosis not present

## 2017-01-28 DIAGNOSIS — Z7901 Long term (current) use of anticoagulants: Secondary | ICD-10-CM | POA: Diagnosis not present

## 2017-01-28 DIAGNOSIS — Z23 Encounter for immunization: Secondary | ICD-10-CM | POA: Diagnosis not present

## 2017-01-28 DIAGNOSIS — I480 Paroxysmal atrial fibrillation: Secondary | ICD-10-CM | POA: Diagnosis not present

## 2017-02-07 ENCOUNTER — Other Ambulatory Visit: Payer: Self-pay | Admitting: Nurse Practitioner

## 2017-02-11 ENCOUNTER — Encounter (INDEPENDENT_AMBULATORY_CARE_PROVIDER_SITE_OTHER): Payer: Self-pay

## 2017-02-11 ENCOUNTER — Ambulatory Visit: Payer: Medicare HMO | Admitting: Nurse Practitioner

## 2017-02-11 ENCOUNTER — Other Ambulatory Visit: Payer: Self-pay | Admitting: *Deleted

## 2017-02-11 ENCOUNTER — Encounter: Payer: Self-pay | Admitting: Nurse Practitioner

## 2017-02-11 VITALS — BP 120/80 | HR 59 | Ht 69.0 in | Wt 283.8 lb

## 2017-02-11 DIAGNOSIS — E7800 Pure hypercholesterolemia, unspecified: Secondary | ICD-10-CM

## 2017-02-11 DIAGNOSIS — I119 Hypertensive heart disease without heart failure: Secondary | ICD-10-CM

## 2017-02-11 DIAGNOSIS — Z79899 Other long term (current) drug therapy: Secondary | ICD-10-CM | POA: Diagnosis not present

## 2017-02-11 DIAGNOSIS — Z7901 Long term (current) use of anticoagulants: Secondary | ICD-10-CM

## 2017-02-11 DIAGNOSIS — I480 Paroxysmal atrial fibrillation: Secondary | ICD-10-CM

## 2017-02-11 LAB — HEPATIC FUNCTION PANEL
ALT: 4 IU/L (ref 0–44)
AST: 11 IU/L (ref 0–40)
Albumin: 3.9 g/dL (ref 3.5–4.8)
Alkaline Phosphatase: 125 IU/L — ABNORMAL HIGH (ref 39–117)
Bilirubin Total: 0.7 mg/dL (ref 0.0–1.2)
Bilirubin, Direct: 0.18 mg/dL (ref 0.00–0.40)
Total Protein: 6.3 g/dL (ref 6.0–8.5)

## 2017-02-11 LAB — LIPID PANEL
Chol/HDL Ratio: 3.4 ratio (ref 0.0–5.0)
Cholesterol, Total: 136 mg/dL (ref 100–199)
HDL: 40 mg/dL (ref >39)
LDL Calculated: 57 mg/dL (ref 0–99)
Triglycerides: 193 mg/dL — ABNORMAL HIGH (ref 0–149)
VLDL Cholesterol Cal: 39 mg/dL (ref 5–40)

## 2017-02-11 LAB — BASIC METABOLIC PANEL
BUN/Creatinine Ratio: 14 (ref 10–24)
BUN: 52 mg/dL — ABNORMAL HIGH (ref 8–27)
CO2: 20 mmol/L (ref 20–29)
Calcium: 8.8 mg/dL (ref 8.6–10.2)
Chloride: 104 mmol/L (ref 96–106)
Creatinine, Ser: 3.8 mg/dL — ABNORMAL HIGH (ref 0.76–1.27)
GFR calc Af Amer: 17 mL/min/{1.73_m2} — ABNORMAL LOW (ref >59)
GFR calc non Af Amer: 15 mL/min/{1.73_m2} — ABNORMAL LOW (ref >59)
Glucose: 92 mg/dL (ref 65–99)
Potassium: 5 mmol/L (ref 3.5–5.2)
Sodium: 142 mmol/L (ref 134–144)

## 2017-02-11 LAB — CBC
Hematocrit: 34.5 % — ABNORMAL LOW (ref 37.5–51.0)
Hemoglobin: 11.6 g/dL — ABNORMAL LOW (ref 13.0–17.7)
MCH: 33 pg (ref 26.6–33.0)
MCHC: 33.6 g/dL (ref 31.5–35.7)
MCV: 98 fL — ABNORMAL HIGH (ref 79–97)
Platelets: 271 10*3/uL (ref 150–379)
RBC: 3.51 x10E6/uL — ABNORMAL LOW (ref 4.14–5.80)
RDW: 14.6 % (ref 12.3–15.4)
WBC: 6.8 10*3/uL (ref 3.4–10.8)

## 2017-02-11 LAB — TSH: TSH: 1.28 u[IU]/mL (ref 0.450–4.500)

## 2017-02-11 NOTE — Progress Notes (Addendum)
CARDIOLOGY OFFICE NOTE  Date:  02/11/2017    Bradley Stokes Date of Birth: 1945-06-21 Medical Record #[id number]  PCP:  [name], MD  Cardiologist:  Servando Snare    Chief Complaint  Patient presents with  . Atrial Fibrillation  . Hypertension  . Hyperlipidemia    Follow up visit     History of Present Illness: Bradley Stokes is a 71 y.o. male who presents today for a 7 month check. Former patient of Dr. Sherryl Barters. He has elected to follow with me.   He has known PAF, on long term anticoagulation, on AAD therapy with amiodarone, HTN, gout, morbid obesity, CKD and HLD. His INR is checked by PCP.   Last seen back in April - felt to be doing ok from our standpoint.   Comes back today. Here alone. Could not walk in - had to be put in a wheelchair. He says he has had a rough time. Cancelled last visit - sister has been ill and hospitalized - she is now back home  - trying to help take care of her. He has lost weight because of her illness - eating less. Feels good for the most part. No chest pain. Breathing is "pretty good" unless he walks too far. No palpitations. Coumadin doing ok. Will have some dizziness if gets up too quick. No syncope. No significant swelling. We were able to get a CXR earlier this year due to being on amiodarone.    Past Medical History:  Diagnosis Date  . Arthritis    severe arthritis and deformity of his knee's  . Chest pain   . Edema   . History of cardiovascular stress test 01/17/2008   EF- 62%  . Hyperlipidemia   . Hypertension   . Kidney stones   . Long-term (current) use of anticoagulants   . Obesity   . PAF (paroxysmal atrial fibrillation) ([place])     Past Surgical History:  Procedure Laterality Date  . CARDIOLITE STRESS TEST     EF 62%     Medications: Current Meds  Medication Sig  . acetaminophen (TYLENOL) 325 MG tablet Take 325 mg by mouth every 6 (six) hours as needed (pain).   Marland Kitchen allopurinol (ZYLOPRIM) 100 MG  tablet Take 100 mg by mouth daily.  Marland Kitchen amiodarone (PACERONE) 200 MG tablet TAKE 1/2 TABLET(100 MG) BY MOUTH DAILY  . ammonium lactate (LAC-HYDRIN) 12 % lotion Apply 1 application topically 2 (two) times daily.  Marland Kitchen atorvastatin (LIPITOR) 20 MG tablet Take 20 mg by mouth Daily.  . calcitRIOL (ROCALTROL) 0.5 MCG capsule Take 0.5 mcg daily by mouth.   . desoximetasone (TOPICORT) 0.25 % cream Apply 1 application topically 2 (two) times daily.  . furosemide (LASIX) 20 MG tablet TAKE 2 TABLETS BY MOUTH TWICE DAILY  . lisinopril (PRINIVIL,ZESTRIL) 20 MG tablet Take 20 mg by mouth daily.  Marland Kitchen warfarin (COUMADIN) 2 MG tablet TK 1 AND 1/2 TS PO ONCE A DAY  . [DISCONTINUED] calcitRIOL (ROCALTROL) 0.25 MCG capsule Take 0.25 mcg by mouth every other day. Take on Mondays, Wednesdays, and Fridays.  . [DISCONTINUED] metoprolol tartrate (LOPRESSOR) 25 MG tablet TAKE 1/2 TABLET(12.5 MG) BY MOUTH TWICE DAILY     Allergies: Allergies  Allergen Reactions  . Other     Tape,needs paper tape    Social History: The patient  reports that  has never smoked. he has never used smokeless tobacco. He reports that he does not drink alcohol or use drugs.  Family History: The patient's family history includes Crohn's disease in his sister; Heart disease in his brother; Heart failure in his mother; Kidney failure in his sister; Pneumonia in his mother.   Review of Systems: Please see the history of present illness.   Otherwise, the review of systems is positive for none.   All other systems are reviewed and negative.   Physical Exam: VS:  BP 120/80 (BP Location: Left Arm, Patient Position: Sitting, Cuff Size: Large)   Pulse (!) 59   Ht 5\' 9"  (1.753 m)   Wt 283 lb 12.8 oz (128.7 kg)   BMI 41.91 kg/m  .  BMI Body mass index is 41.91 kg/m.  Wt Readings from Last 3 Encounters:  02/11/17 283 lb 12.8 oz (128.7 kg)  07/08/16 (!) 300 lb 12.8 oz (136.4 kg)  01/08/16 300 lb (136.1 kg)    General: Pleasant. Morobidly  obese but alert and in no acute distress.  Quite talkative. He has lost 17 pounds. Color looks sallow.  HEENT: Normal.  Neck: Supple, no JVD, carotid bruits, or masses noted.  Cardiac: Regular rate and rhythm. No murmurs, rubs, or gallops. No significant edema.  Respiratory:  Lungs are clear to auscultation bilaterally with normal work of breathing.  GI: Soft and nontender.  MS: No deformity or atrophy. Gait not tested. Skin: Warm and dry. Color is sallow.  Neuro:  Strength and sensation are intact and no gross focal deficits noted.  Psych: Alert, appropriate and with normal affect.   LABORATORY DATA:  EKG:  EKG is ordered today. This shows possible junctional versus sinus with 1st degree AV block. HR is 59.   Lab Results  Component Value Date   WBC 9.7 07/08/2016   HGB 13.0 07/08/2016   HCT 38.7 07/08/2016   PLT 270 07/08/2016   GLUCOSE 99 07/08/2016   CHOL 125 11/22/2013   TRIG 125.0 11/22/2013   HDL 38.60 (L) 11/22/2013   LDLCALC 61 11/22/2013   ALT 7 07/08/2016   AST 12 07/08/2016   NA 144 07/08/2016   K 4.5 07/08/2016   CL 104 07/08/2016   CREATININE 2.07 (H) 07/08/2016   BUN 29 (H) 07/08/2016   CO2 22 07/08/2016   TSH 1.040 07/08/2016   INR 2.11 (H) 10/09/2010       BNP (last 3 results) No results for input(s): BNP in the last 8760 hours.  ProBNP (last 3 results) No results for input(s): PROBNP in the last 8760 hours.   Other Studies Reviewed Today:  Echo Study Conclusions from 12/2014  - Left ventricle: The cavity size was normal. Wall thickness was  normal. Systolic function was normal. The estimated ejection  fraction was in the range of 55% to 60%. - Aortic valve: There was mild regurgitation. - Left atrium: The atrium was moderately dilated.   Assessment/Plan:  1. PAF - looks to be in sinus versus junctional - no definitive p waves - not in AF. Stopping beta blocker today. No syncope noted but has had some dizziness that sounds  positional. Lab today.   2. HTN - BP ok on his current regimen  3. Hyperlipidemia - on statin therapy - no recent lipids - checking today.   4. CKD - he is managed by Dr. Justin Mend.  5. Antiarrhythmic therapy with amiodarone - needs surveillance labs today  6. Chronic anticoagulation - followed by PCP - no problems noted.   Current medicines are reviewed with the patient today.  The patient does not have concerns regarding  medicines other than what has been noted above.  The following changes have been made:  See above.  Labs/ tests ordered today include:    Orders Placed This Encounter  Procedures  . Basic metabolic panel  . CBC  . Hepatic function panel  . Lipid panel  . TSH  . EKG 12-Lead     Disposition:   FU with me in 4 months with EKG and labs.   Patient is agreeable to this plan and will call if any problems develop in the interim.   SignedTruitt Merle, NP  02/11/2017 10:44 AM  [facility] [address] [place], [place]  [address] Phone: [phone] Fax: [phone]       Addendum:  02/17/17  Noted from recent lab that he has worsening CKD - ACE has been stopped. Came for repeat lab today.  BP is 138/78.  For now, will keep on his current regimen.  Burtis Junes, RN, [facility] [address] [place], [place]  [address] [phone]

## 2017-02-11 NOTE — Patient Instructions (Addendum)
We will be checking the following labs today - BMET, CBC, HPF, Lipids and TSH   Medication Instructions:    Continue with your current medicines. BUT  I am stopping your Metoprolol    Testing/Procedures To Be Arranged:  N/A  Follow-Up:   See me in 4 months with lab and EKG    Other Special Instructions:   N/A    If you need a refill on your cardiac medications before your next appointment, please call your pharmacy.   Call the [place] office at [phone] if you have any questions, problems or concerns.

## 2017-02-17 ENCOUNTER — Other Ambulatory Visit: Payer: Self-pay | Admitting: *Deleted

## 2017-02-17 ENCOUNTER — Other Ambulatory Visit: Payer: Medicare HMO | Admitting: *Deleted

## 2017-02-17 DIAGNOSIS — Z7901 Long term (current) use of anticoagulants: Secondary | ICD-10-CM

## 2017-02-17 DIAGNOSIS — N189 Chronic kidney disease, unspecified: Secondary | ICD-10-CM

## 2017-02-17 DIAGNOSIS — I480 Paroxysmal atrial fibrillation: Secondary | ICD-10-CM

## 2017-02-17 LAB — BASIC METABOLIC PANEL
BUN/Creatinine Ratio: 12 (ref 10–24)
BUN: 30 mg/dL — ABNORMAL HIGH (ref 8–27)
CO2: 17 mmol/L — ABNORMAL LOW (ref 20–29)
Calcium: 9.3 mg/dL (ref 8.6–10.2)
Chloride: 101 mmol/L (ref 96–106)
Creatinine, Ser: 2.57 mg/dL — ABNORMAL HIGH (ref 0.76–1.27)
GFR calc Af Amer: 27 mL/min/{1.73_m2} — ABNORMAL LOW (ref >59)
GFR calc non Af Amer: 24 mL/min/{1.73_m2} — ABNORMAL LOW (ref >59)
Glucose: 104 mg/dL — ABNORMAL HIGH (ref 65–99)
Potassium: 4.2 mmol/L (ref 3.5–5.2)
Sodium: 140 mmol/L (ref 134–144)

## 2017-02-18 NOTE — Progress Notes (Signed)
Noted.   Bradley Stokes 

## 2017-02-23 ENCOUNTER — Ambulatory Visit: Payer: Medicare HMO | Admitting: Podiatry

## 2017-02-23 ENCOUNTER — Encounter: Payer: Self-pay | Admitting: Podiatry

## 2017-02-23 DIAGNOSIS — M79674 Pain in right toe(s): Secondary | ICD-10-CM

## 2017-02-23 DIAGNOSIS — M79675 Pain in left toe(s): Secondary | ICD-10-CM | POA: Diagnosis not present

## 2017-02-23 DIAGNOSIS — D689 Coagulation defect, unspecified: Secondary | ICD-10-CM

## 2017-02-23 DIAGNOSIS — B351 Tinea unguium: Secondary | ICD-10-CM | POA: Diagnosis not present

## 2017-02-25 DIAGNOSIS — I480 Paroxysmal atrial fibrillation: Secondary | ICD-10-CM | POA: Diagnosis not present

## 2017-02-25 DIAGNOSIS — Z7901 Long term (current) use of anticoagulants: Secondary | ICD-10-CM | POA: Diagnosis not present

## 2017-02-25 NOTE — Progress Notes (Signed)
Subjective: 71 y.o. returns the office today for painful, elongated, thickened toenails which they cannot trim themself. Denies any redness or drainage around the nails. Denies any acute changes since last appointment and no new complaints today. Denies any systemic complaints such as fevers, chills, nausea, vomiting.   He is on coumadin.   Objective: AAO 3, NAD DP/PT pulses palpable, CRT less than 3 seconds  Nails hypertrophic, dystrophic, elongated, brittle, discolored 10. There is tenderness overlying the nails 1-5 bilaterally. There is continued evidence of dried blood along the right hallux toenail after debridement there is no underlying wound present. There is dried blood around many of the nails. There is no active bleeding present. No open sores, lacerations, fissuring. There is no erythema or clinical signs of infection. No open lesions or pre-ulcerative lesions are identified. Chronic venous changes present bilaterally.  No other areas of tenderness bilateral lower extremities. No overlying edema, erythema, increased warmth. No pain with calf compression, swelling, warmth, erythema.  Assessment: Patient presents with symptomatic onychomycosis  Plan: -Treatment options including alternatives, risks, complications were discussed -Nails sharply debrided 10 without complication/bleeding. -Discussed daily foot inspection. If there are any changes, to call the office immediately.  -Follow-up in 3 months or sooner if any problems are to arise. In the meantime, encouraged to call the office with any questions, concerns, changes symptoms.  Celesta Gentile, DPM

## 2017-03-17 ENCOUNTER — Other Ambulatory Visit: Payer: Medicare HMO | Admitting: *Deleted

## 2017-03-17 DIAGNOSIS — I480 Paroxysmal atrial fibrillation: Secondary | ICD-10-CM

## 2017-03-17 DIAGNOSIS — N189 Chronic kidney disease, unspecified: Secondary | ICD-10-CM | POA: Diagnosis not present

## 2017-03-17 LAB — BASIC METABOLIC PANEL
BUN/Creatinine Ratio: 10 (ref 10–24)
BUN: 18 mg/dL (ref 8–27)
CO2: 22 mmol/L (ref 20–29)
Calcium: 9.1 mg/dL (ref 8.6–10.2)
Chloride: 101 mmol/L (ref 96–106)
Creatinine, Ser: 1.79 mg/dL — ABNORMAL HIGH (ref 0.76–1.27)
GFR calc Af Amer: 43 mL/min/{1.73_m2} — ABNORMAL LOW (ref >59)
GFR calc non Af Amer: 37 mL/min/{1.73_m2} — ABNORMAL LOW (ref >59)
Glucose: 93 mg/dL (ref 65–99)
Potassium: 4.2 mmol/L (ref 3.5–5.2)
Sodium: 141 mmol/L (ref 134–144)

## 2017-03-18 DIAGNOSIS — Z7901 Long term (current) use of anticoagulants: Secondary | ICD-10-CM | POA: Diagnosis not present

## 2017-03-18 DIAGNOSIS — I480 Paroxysmal atrial fibrillation: Secondary | ICD-10-CM | POA: Diagnosis not present

## 2017-03-26 DIAGNOSIS — N2581 Secondary hyperparathyroidism of renal origin: Secondary | ICD-10-CM | POA: Diagnosis not present

## 2017-03-26 DIAGNOSIS — Z6841 Body Mass Index (BMI) 40.0 and over, adult: Secondary | ICD-10-CM | POA: Diagnosis not present

## 2017-03-26 DIAGNOSIS — N183 Chronic kidney disease, stage 3 (moderate): Secondary | ICD-10-CM | POA: Diagnosis not present

## 2017-03-26 DIAGNOSIS — D631 Anemia in chronic kidney disease: Secondary | ICD-10-CM | POA: Diagnosis not present

## 2017-04-01 DIAGNOSIS — Z7901 Long term (current) use of anticoagulants: Secondary | ICD-10-CM | POA: Diagnosis not present

## 2017-04-01 DIAGNOSIS — I480 Paroxysmal atrial fibrillation: Secondary | ICD-10-CM | POA: Diagnosis not present

## 2017-04-10 DIAGNOSIS — N183 Chronic kidney disease, stage 3 (moderate): Secondary | ICD-10-CM | POA: Diagnosis not present

## 2017-04-15 DIAGNOSIS — Z7901 Long term (current) use of anticoagulants: Secondary | ICD-10-CM | POA: Diagnosis not present

## 2017-04-15 DIAGNOSIS — I480 Paroxysmal atrial fibrillation: Secondary | ICD-10-CM | POA: Diagnosis not present

## 2017-04-29 DIAGNOSIS — I480 Paroxysmal atrial fibrillation: Secondary | ICD-10-CM | POA: Diagnosis not present

## 2017-04-29 DIAGNOSIS — Z7901 Long term (current) use of anticoagulants: Secondary | ICD-10-CM | POA: Diagnosis not present

## 2017-05-25 ENCOUNTER — Encounter: Payer: Self-pay | Admitting: Podiatry

## 2017-05-25 ENCOUNTER — Ambulatory Visit: Payer: Medicare HMO | Admitting: Podiatry

## 2017-05-25 DIAGNOSIS — M79674 Pain in right toe(s): Secondary | ICD-10-CM | POA: Diagnosis not present

## 2017-05-25 DIAGNOSIS — D689 Coagulation defect, unspecified: Secondary | ICD-10-CM | POA: Diagnosis not present

## 2017-05-25 DIAGNOSIS — M79675 Pain in left toe(s): Secondary | ICD-10-CM | POA: Diagnosis not present

## 2017-05-25 DIAGNOSIS — B351 Tinea unguium: Secondary | ICD-10-CM | POA: Diagnosis not present

## 2017-05-25 NOTE — Progress Notes (Signed)
Subjective: 72 y.o. returns the office today for painful, elongated, thickened toenails which they cannot trim themself. Denies any redness or drainage around the nails. Denies any acute changes since last appointment and no new complaints today. Denies any systemic complaints such as fevers, chills, nausea, vomiting.   He is on coumadin.   Objective: AAO 3, NAD DP/PT pulses palpable, CRT less than 3 seconds  Nails hypertrophic, dystrophic, elongated, brittle, discolored 10. There is tenderness overlying the nails 1-5 bilaterally. There is continued evidence of dried blood along the right hallux toenail after debridement there is no underlying wound present. This is improved compared to what it has been. There is dried blood around many of the nails. There is no active bleeding present. No open sores, lacerations, fissuring. There is no erythema or clinical signs of infection. No open lesions or pre-ulcerative lesions are identified. Chronic venous changes present bilaterally.  No other areas of tenderness bilateral lower extremities. No overlying edema, erythema, increased warmth. No pain with calf compression, swelling, warmth, erythema.  Assessment: Patient presents with symptomatic onychomycosis  Plan: -Treatment options including alternatives, risks, complications were discussed -Nails sharply debrided 10. Minimal bleeding to the left 3rd toenail upon debridement but this is from the actual nail and there is no cut to the skin. It was cleaned and antibiotic ointment was applied. If not healed in the next week to call the office or sooner if needed. Monitor for infection.  -Discussed daily foot inspection. If there are any changes, to call the office immediately.  -Follow-up in 3 months or sooner if any problems are to arise. In the meantime, encouraged to call the office with any questions, concerns, changes symptoms.  Celesta Gentile, DPM

## 2017-05-27 DIAGNOSIS — I480 Paroxysmal atrial fibrillation: Secondary | ICD-10-CM | POA: Diagnosis not present

## 2017-05-27 DIAGNOSIS — Z7901 Long term (current) use of anticoagulants: Secondary | ICD-10-CM | POA: Diagnosis not present

## 2017-06-08 ENCOUNTER — Encounter: Payer: Self-pay | Admitting: Nurse Practitioner

## 2017-06-08 ENCOUNTER — Ambulatory Visit: Payer: Medicare HMO | Admitting: Nurse Practitioner

## 2017-06-08 VITALS — BP 150/72 | HR 73 | Ht 69.0 in | Wt 288.1 lb

## 2017-06-08 DIAGNOSIS — I119 Hypertensive heart disease without heart failure: Secondary | ICD-10-CM | POA: Diagnosis not present

## 2017-06-08 DIAGNOSIS — Z7901 Long term (current) use of anticoagulants: Secondary | ICD-10-CM

## 2017-06-08 DIAGNOSIS — Z79899 Other long term (current) drug therapy: Secondary | ICD-10-CM

## 2017-06-08 DIAGNOSIS — I480 Paroxysmal atrial fibrillation: Secondary | ICD-10-CM

## 2017-06-08 LAB — BASIC METABOLIC PANEL
BUN/Creatinine Ratio: 10 (ref 10–24)
BUN: 20 mg/dL (ref 8–27)
CO2: 24 mmol/L (ref 20–29)
Calcium: 9.1 mg/dL (ref 8.6–10.2)
Chloride: 103 mmol/L (ref 96–106)
Creatinine, Ser: 2.1 mg/dL — ABNORMAL HIGH (ref 0.76–1.27)
GFR calc Af Amer: 35 mL/min/{1.73_m2} — ABNORMAL LOW (ref >59)
GFR calc non Af Amer: 30 mL/min/{1.73_m2} — ABNORMAL LOW (ref >59)
Glucose: 91 mg/dL (ref 65–99)
Potassium: 4 mmol/L (ref 3.5–5.2)
Sodium: 143 mmol/L (ref 134–144)

## 2017-06-08 LAB — HEPATIC FUNCTION PANEL
ALT: 9 IU/L (ref 0–44)
AST: 14 IU/L (ref 0–40)
Albumin: 3.9 g/dL (ref 3.5–4.8)
Alkaline Phosphatase: 159 IU/L — ABNORMAL HIGH (ref 39–117)
Bilirubin Total: 0.7 mg/dL (ref 0.0–1.2)
Bilirubin, Direct: 0.19 mg/dL (ref 0.00–0.40)
Total Protein: 6.7 g/dL (ref 6.0–8.5)

## 2017-06-08 LAB — CBC
Hematocrit: 39.3 % (ref 37.5–51.0)
Hemoglobin: 13.2 g/dL (ref 13.0–17.7)
MCH: 32.8 pg (ref 26.6–33.0)
MCHC: 33.6 g/dL (ref 31.5–35.7)
MCV: 98 fL — ABNORMAL HIGH (ref 79–97)
Platelets: 281 10*3/uL (ref 150–379)
RBC: 4.02 x10E6/uL — ABNORMAL LOW (ref 4.14–5.80)
RDW: 13.2 % (ref 12.3–15.4)
WBC: 7.4 10*3/uL (ref 3.4–10.8)

## 2017-06-08 LAB — LIPID PANEL
Chol/HDL Ratio: 2.9 ratio (ref 0.0–5.0)
Cholesterol, Total: 146 mg/dL (ref 100–199)
HDL: 51 mg/dL (ref >39)
LDL Calculated: 59 mg/dL (ref 0–99)
Triglycerides: 180 mg/dL — ABNORMAL HIGH (ref 0–149)
VLDL Cholesterol Cal: 36 mg/dL (ref 5–40)

## 2017-06-08 LAB — TSH: TSH: 1.21 u[IU]/mL (ref 0.450–4.500)

## 2017-06-08 NOTE — Progress Notes (Signed)
CARDIOLOGY OFFICE NOTE  Date:  06/08/2017    Bradley Stokes Date of Birth: 1945-07-19 Medical Record #[id number]  PCP:  Bradley Nip, MD  Cardiologist:  Bradley Stokes    Chief Complaint  Patient presents with  . Atrial Fibrillation  . Hypertension  . Hyperlipidemia    History of Present Illness: Bradley Stokes is a 72 y.o. male who presents today for a 4 month check. Former patient of Dr. Sherryl Stokes. He has elected to follow with me.   He has known PAF, on long term anticoagulation with Warfarin, on AAD therapy with amiodarone, HTN, gout,morbidobesity, CKD and HLD. His INR is checked by PCP.   Last seen back inNovember - felt to be doing ok. But I did stop his beta blocker - ?junctional on EKG and some dizziness noted. He is on low dose amiodarone. He had been having a rough time - his sister had been ill and hospitalized - he was trying to take care of her. We did his labs back in November- kidney function grossly abnormal and we stopped his ACE.   Comes back today. Here alone. Seems to be doing ok. Still trying to take care of his sister. No chest pain. Breathing is ok. No problems with his coumadin noted. His INR's are checked by PCP. He says he feels better off the ACE. Not really dizzy. His swelling is stable - gets better overnight. He feels like he is doing ok for the most part and has no real concerns.   Past Medical History:  Diagnosis Date  . Arthritis    severe arthritis and deformity of his knee's  . Chest pain   . Edema   . History of cardiovascular stress test 01/17/2008   EF- 62%  . Hyperlipidemia   . Hypertension   . Kidney stones   . Long-term (current) use of anticoagulants   . Obesity   . PAF (paroxysmal atrial fibrillation) ([facility])     Past Surgical History:  Procedure Laterality Date  . CARDIOLITE STRESS TEST     EF 62%     Medications: Current Meds  Medication Sig  . acetaminophen (TYLENOL) 325 MG tablet Take 325 mg by mouth  every 6 (six) hours as needed (pain).   Marland Kitchen allopurinol (ZYLOPRIM) 100 MG tablet Take 100 mg by mouth daily.  Marland Kitchen amiodarone (PACERONE) 200 MG tablet TAKE 1/2 TABLET(100 MG) BY MOUTH DAILY  . ammonium lactate (LAC-HYDRIN) 12 % lotion Apply 1 application topically 2 (two) times daily.  Marland Kitchen atorvastatin (LIPITOR) 20 MG tablet Take 20 mg by mouth Daily.  . calcitRIOL (ROCALTROL) 0.5 MCG capsule Take 0.5 mcg daily by mouth.   . desoximetasone (TOPICORT) 0.25 % cream Apply 1 application topically 2 (two) times daily.  . furosemide (LASIX) 20 MG tablet TAKE 2 TABLETS BY MOUTH TWICE DAILY  . warfarin (COUMADIN) 2 MG tablet TK 1 AND 1/2 TS PO ONCE A DAY     Allergies: Allergies  Allergen Reactions  . Other     Tape,needs paper tape    Social History: The patient  reports that  has never smoked. he has never used smokeless tobacco. He reports that he does not drink alcohol or use drugs.   Family History: The patient's family history includes Crohn's disease in his sister; Heart disease in his brother; Heart failure in his mother; Kidney failure in his sister; Pneumonia in his mother.   Review of Systems: Please see the history of present illness.  Otherwise, the review of systems is positive for none.   All other systems are reviewed and negative.   Physical Exam: VS:  BP (!) 150/72 (BP Location: Left Arm, Patient Position: Sitting, Cuff Size: Normal)   Pulse 73   Ht 5\' 9"  (1.753 m)   Wt 288 lb 1.9 oz (130.7 kg)   BMI 42.55 kg/m  .  BMI Body mass index is 42.55 kg/m.  Wt Readings from Last 3 Encounters:  06/08/17 288 lb 1.9 oz (130.7 kg)  02/11/17 283 lb 12.8 oz (128.7 kg)  07/08/16 (!) 300 lb 12.8 oz (136.4 kg)   Recheck of his BP by me is 130/80  General: Pleasant. Obese. Alert and in no acute distress.   HEENT: Normal.  Neck: Supple, no JVD, carotid bruits, or masses noted.  Cardiac: Regular rate and rhythm. No murmurs, rubs, or gallops. + chronic edema.  Respiratory:  Lungs are  clear to auscultation bilaterally with normal work of breathing.  GI: Soft and nontender.  MS: No deformity or atrophy. Gait and ROM intact. Using a cane.  Skin: Warm and dry. Color is normal.  Neuro:  Strength and sensation are intact and no gross focal deficits noted.  Psych: Alert, appropriate and with normal affect.   LABORATORY DATA:  EKG:  EKG is ordered today. This demonstrates NSR with 1st degree AV block.  Lab Results  Component Value Date   WBC 6.8 02/11/2017   HGB 11.6 (L) 02/11/2017   HCT 34.5 (L) 02/11/2017   PLT 271 02/11/2017   GLUCOSE 93 03/17/2017   CHOL 136 02/11/2017   TRIG 193 (H) 02/11/2017   HDL 40 02/11/2017   LDLCALC 57 02/11/2017   ALT 4 02/11/2017   AST 11 02/11/2017   NA 141 03/17/2017   K 4.2 03/17/2017   CL 101 03/17/2017   CREATININE 1.79 (H) 03/17/2017   BUN 18 03/17/2017   CO2 22 03/17/2017   TSH 1.280 02/11/2017   INR 2.11 (H) 10/09/2010    BNP (last 3 results) No results for input(s): BNP in the last 8760 hours.  ProBNP (last 3 results) No results for input(s): PROBNP in the last 8760 hours.   Other Studies Reviewed Today:  Echo Study Conclusions from 12/2014  - Left ventricle: The cavity size was normal. Wall thickness was  normal. Systolic function was normal. The estimated ejection  fraction was in the range of 55% to 60%. - Aortic valve: There was mild regurgitation. - Left atrium: The atrium was moderately dilated.   Assessment/Plan:  1. PAF - he is in sinus - remains on low dose Amiodarone. Lab today. No changes made.   2. HTN - BP recheck by me is ok. No changes made today  3. Hyperlipidemia - on statin therapy - checking labs today.   4. CKD - he is managed by Bradley Stokes.  5. Antiarrhythmic therapy with amiodarone - needs surveillance labs today  6. Chronic anticoagulation - followed by PCP- no problems noted.CBC today.   Overall, he seems to be holding his own and looks pretty good today. No  changes made today.   Current medicines are reviewed with the patient today.  The patient does not have concerns regarding medicines other than what has been noted above.  The following changes have been made:  See above.  Labs/ tests ordered today include:    Orders Placed This Encounter  Procedures  . Basic metabolic panel  . CBC  . TSH  . Hepatic function panel  .  Lipid panel  . EKG 12-Lead     Disposition:   FU with me in about 4 months with EKG.   Patient is agreeable to this plan and will call if any problems develop in the interim.   SignedTruitt Merle, NP  06/08/2017 10:14 AM  [facility] [address] [place], [place]  [address] Phone: [phone] Fax: [phone]

## 2017-06-08 NOTE — Patient Instructions (Addendum)
We will be checking the following labs today - BMET, CBC, HPF, TSH and lipids   Medication Instructions:    Continue with your current medicines.     Testing/Procedures To Be Arranged:  N/A  Follow-Up:   See me in 4 months    Other Special Instructions:   N/A    If you need a refill on your cardiac medications before your next appointment, please call your pharmacy.   Call the [facility] office at [phone] if you have any questions, problems or concerns.

## 2017-06-24 DIAGNOSIS — Z7901 Long term (current) use of anticoagulants: Secondary | ICD-10-CM | POA: Diagnosis not present

## 2017-06-24 DIAGNOSIS — I480 Paroxysmal atrial fibrillation: Secondary | ICD-10-CM | POA: Diagnosis not present

## 2017-07-22 DIAGNOSIS — Z7901 Long term (current) use of anticoagulants: Secondary | ICD-10-CM | POA: Diagnosis not present

## 2017-07-22 DIAGNOSIS — I480 Paroxysmal atrial fibrillation: Secondary | ICD-10-CM | POA: Diagnosis not present

## 2017-08-05 DIAGNOSIS — Z7901 Long term (current) use of anticoagulants: Secondary | ICD-10-CM | POA: Diagnosis not present

## 2017-08-19 DIAGNOSIS — Z7901 Long term (current) use of anticoagulants: Secondary | ICD-10-CM | POA: Diagnosis not present

## 2017-08-19 DIAGNOSIS — I480 Paroxysmal atrial fibrillation: Secondary | ICD-10-CM | POA: Diagnosis not present

## 2017-08-25 ENCOUNTER — Ambulatory Visit: Payer: Medicare HMO | Admitting: Podiatry

## 2017-08-25 DIAGNOSIS — M79675 Pain in left toe(s): Secondary | ICD-10-CM | POA: Diagnosis not present

## 2017-08-25 DIAGNOSIS — M79674 Pain in right toe(s): Secondary | ICD-10-CM | POA: Diagnosis not present

## 2017-08-25 DIAGNOSIS — B351 Tinea unguium: Secondary | ICD-10-CM | POA: Diagnosis not present

## 2017-08-25 DIAGNOSIS — D689 Coagulation defect, unspecified: Secondary | ICD-10-CM

## 2017-08-26 NOTE — Progress Notes (Signed)
Subjective: 72 y.o. returns the office today for painful, elongated, thickened toenails which they cannot trim themself. Denies any redness or drainage around the nails. He states that he has had ingrown toenails to the majority of his nails his entire life. Denies any acute changes since last appointment and no new complaints today. Denies any systemic complaints such as fevers, chills, nausea, vomiting.   He is on coumadin.   Objective: AAO 3, NAD DP/PT pulses palpable, CRT less than 3 seconds  Nails hypertrophic, dystrophic, elongated, brittle, discolored 10. There is tenderness overlying the nails 1-5 bilaterally. Incurvation to bilateral hallux toenails with some dried blood but overall his nails look much better in regards to all the dried blood he has had previously. No open lesions or pre-ulcerative lesions are identified. Chronic venous changes present bilaterally.  No other areas of tenderness bilateral lower extremities. No overlying edema, erythema, increased warmth. No pain with calf compression, swelling, warmth, erythema.  Assessment: Patient presents with symptomatic onychomycosis  Plan: -Treatment options including alternatives, risks, complications were discussed -Nails sharply debrided 10. Minimal bleeding to the left hallux toenail upon debridement. There was dried blood in the area and during debridement it did bleed some but there was no cut to the skin. It was cleaned and antibiotic ointment was applied. If not healed in the next week to call the office or sooner if needed. Monitor for infection.  -Discussed daily foot inspection. If there are any changes, to call the office immediately.  -Follow-up in 3 months or sooner if any problems are to arise. In the meantime, encouraged to call the office with any questions, concerns, changes symptoms.  Celesta Gentile, DPM

## 2017-09-16 DIAGNOSIS — Z7901 Long term (current) use of anticoagulants: Secondary | ICD-10-CM | POA: Diagnosis not present

## 2017-09-16 DIAGNOSIS — I480 Paroxysmal atrial fibrillation: Secondary | ICD-10-CM | POA: Diagnosis not present

## 2017-09-30 DIAGNOSIS — Z7901 Long term (current) use of anticoagulants: Secondary | ICD-10-CM | POA: Diagnosis not present

## 2017-09-30 DIAGNOSIS — I10 Essential (primary) hypertension: Secondary | ICD-10-CM | POA: Diagnosis not present

## 2017-09-30 DIAGNOSIS — E7800 Pure hypercholesterolemia, unspecified: Secondary | ICD-10-CM | POA: Diagnosis not present

## 2017-09-30 DIAGNOSIS — N183 Chronic kidney disease, stage 3 (moderate): Secondary | ICD-10-CM | POA: Diagnosis not present

## 2017-09-30 DIAGNOSIS — M109 Gout, unspecified: Secondary | ICD-10-CM | POA: Diagnosis not present

## 2017-09-30 DIAGNOSIS — I480 Paroxysmal atrial fibrillation: Secondary | ICD-10-CM | POA: Diagnosis not present

## 2017-10-20 NOTE — Progress Notes (Signed)
CARDIOLOGY OFFICE NOTE  Date:  10/21/2017    Bradley Stokes Date of Birth: 04-19-1945 Medical Record #[id number]  PCP:  Bradley Nip, MD  Cardiologist:  Bradley Stokes     Chief Complaint  Patient presents with  . Atrial Fibrillation  . Hypertension    4 month check    History of Present Illness: Bradley Stokes is a 72 y.o. male who presents today for a 4 month check. Former patient of Dr. Sherryl Stokes. He has elected to follow with me.   He has known PAF, on long term anticoagulation with Warfarin, on AAD therapy with amiodarone, HTN, gout,morbidobesity, CKD and HLD. His INR is checked by PCP.   I saw him back inNovember of 2018 - felt to be doing ok. But I did stop his beta blocker - ?junctional on EKG and some dizziness noted. He is on low dose amiodarone. He had been having a rough time - his sister had been ill and hospitalized - he was trying to take care of her. We did his labs back in November- kidney function grossly abnormal and we stopped his ACE.   Last seen by me in March - was doing ok. Remained off of ACE because of his prior worsening of his kidney function. Otherwise, felt to be ok.   Comes back today. Here alone.Says he is "feeling like an old spring chicken". No chest pain. Breathing is ok. He has gained weight. BP up here today but was 120/78 with his PCP earlier this month. He notes that it goes up and down but most of the time is "ok" - he does not check at home. He is not dizzy. He is taking his medicines regularly. His weight is up. He admits he has not been eating as he should - more junk food - but hoping to get back on track now that his sister is improving with her health. No problems with his coumadin.   Past Medical History:  Diagnosis Date  . Arthritis    severe arthritis and deformity of his knee's  . Chest pain   . Edema   . History of cardiovascular stress test 01/17/2008   EF- 62%  . Hyperlipidemia   . Hypertension   . Kidney  stones   . Long-term (current) use of anticoagulants   . Obesity   . PAF (paroxysmal atrial fibrillation) ([place])     Past Surgical History:  Procedure Laterality Date  . CARDIOLITE STRESS TEST     EF 62%     Medications: Current Meds  Medication Sig  . acetaminophen (TYLENOL) 325 MG tablet Take 325 mg by mouth every 6 (six) hours as needed (pain).   Marland Kitchen allopurinol (ZYLOPRIM) 100 MG tablet Take 100 mg by mouth daily.  Marland Kitchen amiodarone (PACERONE) 200 MG tablet TAKE 1/2 TABLET(100 MG) BY MOUTH DAILY  . ammonium lactate (LAC-HYDRIN) 12 % lotion Apply 1 application topically 2 (two) times daily.  Marland Kitchen atorvastatin (LIPITOR) 20 MG tablet Take 20 mg by mouth Daily.  . calcitRIOL (ROCALTROL) 0.5 MCG capsule Take 0.5 mcg daily by mouth.   . desoximetasone (TOPICORT) 0.25 % cream Apply 1 application topically 2 (two) times daily.  . furosemide (LASIX) 20 MG tablet TAKE 2 TABLETS BY MOUTH TWICE DAILY  . warfarin (COUMADIN) 2 MG tablet TK 1 AND 1/2 TS PO ONCE A DAY     Allergies: Allergies  Allergen Reactions  . Other     Tape,needs paper tape  Social History: The patient  reports that he has never smoked. He has never used smokeless tobacco. He reports that he does not drink alcohol or use drugs.   Family History: The patient's family history includes Crohn's disease in his sister; Heart disease in his brother; Heart failure in his mother; Kidney failure in his sister; Pneumonia in his mother.   Review of Systems: Please see the history of present illness.   Otherwise, the review of systems is positive for none.   All other systems are reviewed and negative.   Physical Exam: VS:  BP (!) 170/78 (BP Location: Left Arm, Patient Position: Sitting, Cuff Size: Large)   Pulse 77   Ht 5\' 9"  (1.753 m)   Wt (!) 302 lb 12.8 oz (137.3 kg)   BMI 44.72 kg/m  .  BMI Body mass index is 44.72 kg/m.  Wt Readings from Last 3 Encounters:  10/21/17 (!) 302 lb 12.8 oz (137.3 kg)  06/08/17 288 lb 1.9  oz (130.7 kg)  02/11/17 283 lb 12.8 oz (128.7 kg)   BP recheck is 140/80 by me today.   General: He is morbidly obese. Looks chronically ill. Alert and in no acute distress.   HEENT: Normal.  Neck: Supple, no JVD, carotid bruits, or masses noted.  Cardiac: Regular rate and rhythm. No murmurs, rubs, or gallops. Trace edema.  Respiratory:  Lungs are fairly clear to auscultation bilaterally with normal work of breathing.  GI: Soft and nontender.  MS: No deformity or atrophy. Gait not tested.  Skin: Warm and dry. Color is normal.  Neuro:  Strength and sensation are intact and no gross focal deficits noted.  Psych: Alert, appropriate and with normal affect.   LABORATORY DATA:  EKG:  EKG is ordered today. This demonstrates NSR with PVC.  Lab Results  Component Value Date   WBC 7.4 06/08/2017   HGB 13.2 06/08/2017   HCT 39.3 06/08/2017   PLT 281 06/08/2017   GLUCOSE 91 06/08/2017   CHOL 146 06/08/2017   TRIG 180 (H) 06/08/2017   HDL 51 06/08/2017   LDLCALC 59 06/08/2017   ALT 9 06/08/2017   AST 14 06/08/2017   NA 143 06/08/2017   K 4.0 06/08/2017   CL 103 06/08/2017   CREATININE 2.10 (H) 06/08/2017   BUN 20 06/08/2017   CO2 24 06/08/2017   TSH 1.210 06/08/2017   INR 2.11 (H) 10/09/2010     BNP (last 3 results) No results for input(s): BNP in the last 8760 hours.  ProBNP (last 3 results) No results for input(s): PROBNP in the last 8760 hours.   Other Studies Reviewed Today:  Echo Study Conclusions from 12/2014  - Left ventricle: The cavity size was normal. Wall thickness was  normal. Systolic function was normal. The estimated ejection  fraction was in the range of 55% to 60%. - Aortic valve: There was mild regurgitation. - Left atrium: The atrium was moderately dilated.   Assessment/Plan:  1. PAF -he remains in NSR - he is on low dose amiodarone. Needs surveillance labs today. No changes made.   2. HTN - recheck by me is better. I suspect his diet  is playing a role. Tried to be encouraging.   3. Hyperlipidemia - he remains on statin - lipids checked in March.   4. CKD - he is managed by Dr. Justin Stokes.  5. Chronic anticoagulation - followed by PCP- he is having surveillance labs today.   6. Morbid obesity - this is the crux  of his issues. Unfortunately, I do not see this changing.   Current medicines are reviewed with the patient today.  The patient does not have concerns regarding medicines other than what has been noted above.  The following changes have been made:  See above.  Labs/ tests ordered today include:    Orders Placed This Encounter  Procedures  . Basic metabolic panel  . CBC  . Hepatic function panel  . TSH  . EKG 12-Lead     Disposition:   FU with me in 4 months.   Patient is agreeable to this plan and will call if any problems develop in the interim.   SignedTruitt Merle, NP  10/21/2017 10:36 AM  [facility] [address] [place], [place]  [address] Phone: [phone] Fax: [phone]

## 2017-10-21 ENCOUNTER — Ambulatory Visit: Payer: Medicare HMO | Admitting: Nurse Practitioner

## 2017-10-21 ENCOUNTER — Encounter: Payer: Self-pay | Admitting: Nurse Practitioner

## 2017-10-21 VITALS — BP 170/78 | HR 77 | Ht 69.0 in | Wt 302.8 lb

## 2017-10-21 DIAGNOSIS — Z79899 Other long term (current) drug therapy: Secondary | ICD-10-CM | POA: Diagnosis not present

## 2017-10-21 DIAGNOSIS — Z7901 Long term (current) use of anticoagulants: Secondary | ICD-10-CM

## 2017-10-21 DIAGNOSIS — I119 Hypertensive heart disease without heart failure: Secondary | ICD-10-CM | POA: Diagnosis not present

## 2017-10-21 LAB — BASIC METABOLIC PANEL
BUN/Creatinine Ratio: 10 (ref 10–24)
BUN: 18 mg/dL (ref 8–27)
CO2: 26 mmol/L (ref 20–29)
Calcium: 9.2 mg/dL (ref 8.6–10.2)
Chloride: 102 mmol/L (ref 96–106)
Creatinine, Ser: 1.84 mg/dL — ABNORMAL HIGH (ref 0.76–1.27)
GFR calc Af Amer: 41 mL/min/{1.73_m2} — ABNORMAL LOW (ref >59)
GFR calc non Af Amer: 35 mL/min/{1.73_m2} — ABNORMAL LOW (ref >59)
Glucose: 94 mg/dL (ref 65–99)
Potassium: 3.6 mmol/L (ref 3.5–5.2)
Sodium: 146 mmol/L — ABNORMAL HIGH (ref 134–144)

## 2017-10-21 LAB — HEPATIC FUNCTION PANEL
ALT: 4 IU/L (ref 0–44)
AST: 8 IU/L (ref 0–40)
Albumin: 3.9 g/dL (ref 3.5–4.8)
Alkaline Phosphatase: 156 IU/L — ABNORMAL HIGH (ref 39–117)
Bilirubin Total: 0.6 mg/dL (ref 0.0–1.2)
Bilirubin, Direct: 0.19 mg/dL (ref 0.00–0.40)
Total Protein: 6.6 g/dL (ref 6.0–8.5)

## 2017-10-21 LAB — CBC
Hematocrit: 39.3 % (ref 37.5–51.0)
Hemoglobin: 13.4 g/dL (ref 13.0–17.7)
MCH: 32.6 pg (ref 26.6–33.0)
MCHC: 34.1 g/dL (ref 31.5–35.7)
MCV: 96 fL (ref 79–97)
Platelets: 306 10*3/uL (ref 150–450)
RBC: 4.11 x10E6/uL — ABNORMAL LOW (ref 4.14–5.80)
RDW: 14.2 % (ref 12.3–15.4)
WBC: 7.4 10*3/uL (ref 3.4–10.8)

## 2017-10-21 LAB — TSH: TSH: 1.22 u[IU]/mL (ref 0.450–4.500)

## 2017-10-21 NOTE — Patient Instructions (Addendum)
We will be checking the following labs today - BMET, CBC, HPF and TSH   Medication Instructions:    Continue with your current medicines.     Testing/Procedures To Be Arranged:  N/A  Follow-Up:   See me in 4 months.     Other Special Instructions:   Try to get back to better eating habits.    If you need a refill on your cardiac medications before your next appointment, please call your pharmacy.   Call the [place] office at [phone] if you have any questions, problems or concerns.

## 2017-10-26 DIAGNOSIS — Z7901 Long term (current) use of anticoagulants: Secondary | ICD-10-CM | POA: Diagnosis not present

## 2017-10-26 DIAGNOSIS — I480 Paroxysmal atrial fibrillation: Secondary | ICD-10-CM | POA: Diagnosis not present

## 2017-11-11 DIAGNOSIS — I4891 Unspecified atrial fibrillation: Secondary | ICD-10-CM | POA: Diagnosis not present

## 2017-11-11 DIAGNOSIS — N183 Chronic kidney disease, stage 3 (moderate): Secondary | ICD-10-CM | POA: Diagnosis not present

## 2017-11-11 DIAGNOSIS — R600 Localized edema: Secondary | ICD-10-CM | POA: Diagnosis not present

## 2017-11-11 DIAGNOSIS — E785 Hyperlipidemia, unspecified: Secondary | ICD-10-CM | POA: Diagnosis not present

## 2017-11-11 DIAGNOSIS — N2581 Secondary hyperparathyroidism of renal origin: Secondary | ICD-10-CM | POA: Diagnosis not present

## 2017-11-11 DIAGNOSIS — D631 Anemia in chronic kidney disease: Secondary | ICD-10-CM | POA: Diagnosis not present

## 2017-11-23 ENCOUNTER — Other Ambulatory Visit: Payer: Self-pay | Admitting: Nurse Practitioner

## 2017-11-23 DIAGNOSIS — Z7901 Long term (current) use of anticoagulants: Secondary | ICD-10-CM | POA: Diagnosis not present

## 2017-11-23 DIAGNOSIS — I480 Paroxysmal atrial fibrillation: Secondary | ICD-10-CM | POA: Diagnosis not present

## 2017-11-24 ENCOUNTER — Encounter: Payer: Self-pay | Admitting: Podiatry

## 2017-11-24 ENCOUNTER — Ambulatory Visit: Payer: Medicare HMO | Admitting: Podiatry

## 2017-11-24 DIAGNOSIS — M79674 Pain in right toe(s): Secondary | ICD-10-CM

## 2017-11-24 DIAGNOSIS — B351 Tinea unguium: Secondary | ICD-10-CM

## 2017-11-24 DIAGNOSIS — M79675 Pain in left toe(s): Secondary | ICD-10-CM

## 2017-11-24 DIAGNOSIS — D689 Coagulation defect, unspecified: Secondary | ICD-10-CM | POA: Diagnosis not present

## 2017-11-24 NOTE — Progress Notes (Signed)
Subjective: 72 y.o. returns the office today for painful, elongated, thickened toenails which they cannot trim themself. Denies any redness or drainage around the nails. Denies any acute changes since last appointment and no new complaints today. Denies any systemic complaints such as fevers, chills, nausea, vomiting.   He is on coumadin.   Objective: AAO 3, NAD DP/PT pulses palpable, CRT less than 3 seconds  Nails hypertrophic, dystrophic, elongated, brittle, discolored 10. There is tenderness overlying the nails 1-5 bilaterally. Incurvation to bilateral hallux toenails with some dried blood but overall his nails look much better in regards to all the dried blood he has had previously. No open lesions or pre-ulcerative lesions are identified. Chronic venous changes present bilaterally.  No other areas of tenderness bilateral lower extremities. No overlying edema, erythema, increased warmth. No pain with calf compression, swelling, warmth, erythema.  Assessment: Patient presents with symptomatic onychomycosis  Plan: -Treatment options including alternatives, risks, complications were discussed -Nails sharply debrided 10 without any complications or bleeding -Discussed daily foot inspection. If there are any changes, to call the office immediately.  -Follow-up in 3 months or sooner if any problems are to arise. In the meantime, encouraged to call the office with any questions, concerns, changes symptoms.  Celesta Gentile, DPM

## 2017-12-21 DIAGNOSIS — Z7901 Long term (current) use of anticoagulants: Secondary | ICD-10-CM | POA: Diagnosis not present

## 2017-12-21 DIAGNOSIS — Z23 Encounter for immunization: Secondary | ICD-10-CM | POA: Diagnosis not present

## 2018-01-18 DIAGNOSIS — I480 Paroxysmal atrial fibrillation: Secondary | ICD-10-CM | POA: Diagnosis not present

## 2018-01-18 DIAGNOSIS — Z7901 Long term (current) use of anticoagulants: Secondary | ICD-10-CM | POA: Diagnosis not present

## 2018-01-19 ENCOUNTER — Encounter: Payer: Self-pay | Admitting: Nurse Practitioner

## 2018-02-01 DIAGNOSIS — N2581 Secondary hyperparathyroidism of renal origin: Secondary | ICD-10-CM | POA: Diagnosis not present

## 2018-02-02 DIAGNOSIS — H524 Presbyopia: Secondary | ICD-10-CM | POA: Diagnosis not present

## 2018-02-09 ENCOUNTER — Telehealth: Payer: Self-pay | Admitting: *Deleted

## 2018-02-09 ENCOUNTER — Ambulatory Visit: Payer: Medicare HMO | Admitting: Nurse Practitioner

## 2018-02-09 ENCOUNTER — Encounter: Payer: Self-pay | Admitting: Nurse Practitioner

## 2018-02-09 VITALS — BP 146/80 | HR 73 | Ht 69.0 in | Wt 313.4 lb

## 2018-02-09 DIAGNOSIS — I480 Paroxysmal atrial fibrillation: Secondary | ICD-10-CM | POA: Diagnosis not present

## 2018-02-09 DIAGNOSIS — R609 Edema, unspecified: Secondary | ICD-10-CM | POA: Diagnosis not present

## 2018-02-09 DIAGNOSIS — Z7901 Long term (current) use of anticoagulants: Secondary | ICD-10-CM | POA: Diagnosis not present

## 2018-02-09 DIAGNOSIS — I119 Hypertensive heart disease without heart failure: Secondary | ICD-10-CM | POA: Diagnosis not present

## 2018-02-09 DIAGNOSIS — Z79899 Other long term (current) drug therapy: Secondary | ICD-10-CM | POA: Diagnosis not present

## 2018-02-09 DIAGNOSIS — N189 Chronic kidney disease, unspecified: Secondary | ICD-10-CM | POA: Diagnosis not present

## 2018-02-09 LAB — CBC
Hematocrit: 40.8 % (ref 37.5–51.0)
Hemoglobin: 13.3 g/dL (ref 13.0–17.7)
MCH: 31.6 pg (ref 26.6–33.0)
MCHC: 32.6 g/dL (ref 31.5–35.7)
MCV: 97 fL (ref 79–97)
Platelets: 303 10*3/uL (ref 150–450)
RBC: 4.21 x10E6/uL (ref 4.14–5.80)
RDW: 13.2 % (ref 12.3–15.4)
WBC: 7.5 10*3/uL (ref 3.4–10.8)

## 2018-02-09 LAB — BASIC METABOLIC PANEL
BUN/Creatinine Ratio: 10 (ref 10–24)
BUN: 20 mg/dL (ref 8–27)
CO2: 25 mmol/L (ref 20–29)
Calcium: 9.1 mg/dL (ref 8.6–10.2)
Chloride: 100 mmol/L (ref 96–106)
Creatinine, Ser: 2 mg/dL — ABNORMAL HIGH (ref 0.76–1.27)
GFR calc Af Amer: 37 mL/min/{1.73_m2} — ABNORMAL LOW (ref >59)
GFR calc non Af Amer: 32 mL/min/{1.73_m2} — ABNORMAL LOW (ref >59)
Glucose: 93 mg/dL (ref 65–99)
Potassium: 3.8 mmol/L (ref 3.5–5.2)
Sodium: 145 mmol/L — ABNORMAL HIGH (ref 134–144)

## 2018-02-09 LAB — HEPATIC FUNCTION PANEL
ALT: 4 IU/L (ref 0–44)
AST: 11 IU/L (ref 0–40)
Albumin: 3.9 g/dL (ref 3.5–4.8)
Alkaline Phosphatase: 156 IU/L — ABNORMAL HIGH (ref 39–117)
Bilirubin Total: 0.8 mg/dL (ref 0.0–1.2)
Bilirubin, Direct: 0.19 mg/dL (ref 0.00–0.40)
Total Protein: 6.6 g/dL (ref 6.0–8.5)

## 2018-02-09 LAB — PRO B NATRIURETIC PEPTIDE: NT-Pro BNP: 145 pg/mL (ref 0–376)

## 2018-02-09 LAB — LIPID PANEL
Chol/HDL Ratio: 2.5 ratio (ref 0.0–5.0)
Cholesterol, Total: 130 mg/dL (ref 100–199)
HDL: 53 mg/dL (ref >39)
LDL Calculated: 47 mg/dL (ref 0–99)
Triglycerides: 148 mg/dL (ref 0–149)
VLDL Cholesterol Cal: 30 mg/dL (ref 5–40)

## 2018-02-09 LAB — TSH: TSH: 1.19 u[IU]/mL (ref 0.450–4.500)

## 2018-02-09 MED ORDER — AMIODARONE HCL 200 MG PO TABS: ORAL_TABLET | ORAL | 3 refills | Status: DC

## 2018-02-09 NOTE — Telephone Encounter (Signed)
-----   Message from Burtis Junes, NP sent at 02/09/2018  4:45 PM EST ----- Ok to report. His labs are stable. Stable kidney function. Fluid level is totally normal.  Would stay on his current regimen - needs to restrict the soda/salt as we talked about. Does not need extra fluid pills at this time.

## 2018-02-09 NOTE — Progress Notes (Signed)
CARDIOLOGY OFFICE NOTE  Date:  02/09/2018    [name] Date of Birth: 1945-08-26 Medical Record #[id number]  PCP:  [name], MD  Cardiologist:  Servando Snare    Chief Complaint  Patient presents with  . Atrial Fibrillation  . Hypertension  . Follow-up    Follow up visit    History of Present Illness: Bradley Stokes is a 72 y.o. male who presents today for a 4 month check. Former patient of Dr. Sherryl Barters. He has elected to follow with me.   He has known PAF, on long term anticoagulationwith Warfarin, on AAD therapy with amiodarone, HTN, gout,morbidobesity, CKD and HLD. His INR is checked by PCP.   I saw him back inNovember of 2018 - felt to be doing ok. But I did stop his beta blocker - ?junctional on EKG and some dizziness noted. He remains on low dose amiodarone. He had been having a rough time - his sister had been ill and hospitalized - he was trying to take care of her. We did his labs back in November- kidney function grossly abnormal and we stopped his ACE.  Last seen in July - was doing ok - had gained weight. BP stable. Social situation quite poor.   Comes back today. Here alone.He needed a wheelchair to get in here. Weight continues to climb. He says he is drinking Mellow yellow - unclear how much. Probably getting too much salt. May have some extra swelling - had to change the shoelace on one of his shoes. More sedentary. Breathing is about the same he says. He moves around the house - not getting out much. No chest pain. No palpitations. He still tries to take care of his sister - they live together.   Past Medical History:  Diagnosis Date  . Arthritis    severe arthritis and deformity of his knee's  . Chest pain   . Edema   . History of cardiovascular stress test 01/17/2008   EF- 62%  . Hyperlipidemia   . Hypertension   . Kidney stones   . Long-term (current) use of anticoagulants   . Obesity   . PAF (paroxysmal atrial  fibrillation) ([place])     Past Surgical History:  Procedure Laterality Date  . CARDIOLITE STRESS TEST     EF 62%     Medications: Current Meds  Medication Sig  . acetaminophen (TYLENOL) 325 MG tablet Take 325 mg by mouth every 6 (six) hours as needed (pain).   Marland Kitchen allopurinol (ZYLOPRIM) 100 MG tablet Take 100 mg by mouth daily.  Marland Kitchen amiodarone (PACERONE) 200 MG tablet TAKE 1/2 TABLET(100 MG) BY MOUTH DAILY  . ammonium lactate (LAC-HYDRIN) 12 % lotion Apply 1 application topically 2 (two) times daily.  Marland Kitchen atorvastatin (LIPITOR) 20 MG tablet Take 20 mg by mouth Daily.  . calcitRIOL (ROCALTROL) 0.5 MCG capsule Take 0.5 mcg daily by mouth.   . desoximetasone (TOPICORT) 0.25 % cream Apply 1 application topically 2 (two) times daily.  . furosemide (LASIX) 20 MG tablet TAKE 2 TABLETS BY MOUTH TWICE DAILY  . warfarin (COUMADIN) 2 MG tablet TK 1 AND 1/2 TS PO ONCE A DAY  . [DISCONTINUED] amiodarone (PACERONE) 200 MG tablet TAKE 1/2 TABLET(100 MG) BY MOUTH DAILY     Allergies: Allergies  Allergen Reactions  . Other     Tape,needs paper tape    Social History: The patient  reports that he has never smoked. He has never used smokeless tobacco. He  reports that he does not drink alcohol or use drugs.   Family History: The patient's family history includes Crohn's disease in his sister; Heart disease in his brother; Heart failure in his mother; Kidney failure in his sister; Pneumonia in his mother.   Review of Systems: Please see the history of present illness.   Otherwise, the review of systems is positive for none.   All other systems are reviewed and negative.   Physical Exam: VS:  BP (!) 146/80 (BP Location: Left Arm, Patient Position: Sitting, Cuff Size: Large)   Pulse 73   Ht 5\' 9"  (1.753 m)   Wt (!) 313 lb 6.4 oz (142.2 kg)   BMI 46.28 kg/m  .  BMI Body mass index is 46.28 kg/m.  Wt Readings from Last 3 Encounters:  02/09/18 (!) 313 lb 6.4 oz (142.2 kg)  10/21/17 (!) 302 lb 12.8  oz (137.3 kg)  06/08/17 288 lb 1.9 oz (130.7 kg)    General: Pleasant. Obese. Alert and in no acute distress. His weight is up 11 pounds since July.   HEENT: Normal.  Neck: Supple, no JVD, carotid bruits, or masses noted.  Cardiac: Regular rate and rhythm. No murmurs, rubs, or gallops. His legs are chronically massive in appearance - ? More swelling.   Respiratory:  Lungs are coarse but normal work of breathing.  GI: Soft and nontender.  MS: No deformity or atrophy. Gait not tested - he had to be placed in the wheelchair due to issues with walking long distances.   Skin: Warm and dry. Color is normal.  Neuro:  Strength and sensation are intact and no gross focal deficits noted.  Psych: Alert, appropriate and with normal affect.   LABORATORY DATA:  EKG:  EKG is ordered today. This demonstrates NSR with 1st degree AV block.  Lab Results  Component Value Date   WBC 7.4 10/21/2017   HGB 13.4 10/21/2017   HCT 39.3 10/21/2017   PLT 306 10/21/2017   GLUCOSE 94 10/21/2017   CHOL 146 06/08/2017   TRIG 180 (H) 06/08/2017   HDL 51 06/08/2017   LDLCALC 59 06/08/2017   ALT 4 10/21/2017   AST 8 10/21/2017   NA 146 (H) 10/21/2017   K 3.6 10/21/2017   CL 102 10/21/2017   CREATININE 1.84 (H) 10/21/2017   BUN 18 10/21/2017   CO2 26 10/21/2017   TSH 1.220 10/21/2017   INR 2.11 (H) 10/09/2010      BNP (last 3 results) No results for input(s): BNP in the last 8760 hours.  ProBNP (last 3 results) No results for input(s): PROBNP in the last 8760 hours.   Other Studies Reviewed Today:  Echo Study Conclusions from 12/2014  - Left ventricle: The cavity size was normal. Wall thickness was  normal. Systolic function was normal. The estimated ejection  fraction was in the range of 55% to 60%. - Aortic valve: There was mild regurgitation. - Left atrium: The atrium was moderately dilated.   Assessment/Plan:  1. PAF -he remains in NSR by exam and by EKG today - he remains on  low dose amiodarone. Needs surveillance labs today. No changes made.   2. HTN - I still think his diet plays a role. BP fair today.   3. Hyperlipidemia - he remains on statin - checking labs today.   4. CKD - he is managed by Dr. Justin Mend.  5. Chronic anticoagulation - followed by PCP- no problems noted.   6. Morbid obesity - this remains  the crux of his issues - he may have some extra swelling on exam - check lab today to include BNP - may need to adjust his diuretics - further disposition to follow and will then decide about his follow up back here.  Current medicines are reviewed with the patient today.  The patient does not have concerns regarding medicines other than what has been noted above.  The following changes have been made:  See above.  Labs/ tests ordered today include:    Orders Placed This Encounter  Procedures  . Basic metabolic panel  . CBC  . Hepatic function panel  . Lipid panel  . Pro b natriuretic peptide (BNP)  . TSH  . EKG 12-Lead     Disposition:   Further disposition pending.   Patient is agreeable to this plan and will call if any problems develop in the interim.   SignedTruitt Merle, NP  02/09/2018 11:01 AM  [facility] [address] [place], [place]  [address] Phone: [phone] Fax: [phone]

## 2018-02-09 NOTE — Telephone Encounter (Signed)
Let's see back in 6 months.

## 2018-02-09 NOTE — Telephone Encounter (Signed)
Pt notified of results. Pt asked what about his appt? Pt states Truitt Merle, NP told him at today's ov that we will let him know when he needs to f/u after reviewing today's labs. I advised pt that Truitt Merle, NP did not specify when to f/u though her Sultana can call tomorrow with appt recommendations per Truitt Merle, NP. Pt thanked me for the call. Pt is agreeable to limit salt/soda intake. Pt aware per NP no need for extra fluid pills at this time.

## 2018-02-09 NOTE — Patient Instructions (Addendum)
We will be checking the following labs today - BMET, CBC, HLD, lipids and TSH  If you have labs (blood work) drawn today and your tests are completely normal, you will receive your results only by: Marland Kitchen MyChart Message (if you have MyChart) OR . A paper copy in the mail If you have any lab test that is abnormal or we need to change your treatment, we will call you to review the results.   Medication Instructions:    Continue with your current medicines.   I refilled the amiodarone today   If you need a refill on your cardiac medications before your next appointment, please call your pharmacy.     Testing/Procedures To Be Arranged:  N/A  Follow-Up:   Lets see what your labs show and we will then decide about follow    At [facility], you and your health needs are our priority.  As part of our continuing mission to provide you with exceptional heart care, we have created designated Provider Care Teams.  These Care Teams include your primary Cardiologist (physician) and Advanced Practice Providers (APPs -  Physician Assistants and Nurse Practitioners) who all work together to provide you with the care you need, when you need it.  Special Instructions:  . None  Call the [place] office at [phone] if you have any questions, problems or concerns.

## 2018-02-15 DIAGNOSIS — Z7901 Long term (current) use of anticoagulants: Secondary | ICD-10-CM | POA: Diagnosis not present

## 2018-02-24 ENCOUNTER — Ambulatory Visit: Payer: Medicare HMO | Admitting: Podiatry

## 2018-02-24 DIAGNOSIS — B351 Tinea unguium: Secondary | ICD-10-CM

## 2018-02-24 DIAGNOSIS — M79675 Pain in left toe(s): Secondary | ICD-10-CM | POA: Diagnosis not present

## 2018-02-24 DIAGNOSIS — M79674 Pain in right toe(s): Secondary | ICD-10-CM | POA: Diagnosis not present

## 2018-02-24 DIAGNOSIS — L600 Ingrowing nail: Secondary | ICD-10-CM

## 2018-02-24 NOTE — Patient Instructions (Addendum)
Soak Instructions   Soak once a day for seven days.  Call if there are any redness, swelling, pain, pus etc.  THE DAY AFTER THE PROCEDURE  Place 1/4 cup of epsom salts in a quart of warm tap water.  Submerge your foot or feet with outer bandage intact for the initial soak; this will allow the bandage to become moist and wet for easy lift off.  Once you remove your bandage, continue to soak in the solution for 20 minutes.  This soak should be done twice a day.  Next, remove your foot or feet from solution, blot dry the affected area and cover.  You may use a band aid large enough to cover the area or use gauze and tape.  Apply other medications to the area as directed by the doctor such as polysporin neosporin.  IF YOUR SKIN BECOMES IRRITATED WHILE USING THESE INSTRUCTIONS, IT IS OKAY TO SWITCH TO  WHITE VINEGAR AND WATER. Or you may use antibacterial soap and water to keep the toe clean  Monitor for any signs/symptoms of infection. Call the office immediately if any occur or go directly to the emergency room. Call with any questions/concerns.    Onychomycosis/Fungal Toenails  WHAT IS IT? An infection that lies within the keratin of your nail plate that is caused by a fungus.  WHY ME? Fungal infections affect all ages, sexes, races, and creeds.  There may be many factors that predispose you to a fungal infection such as age, coexisting medical conditions such as diabetes, or an autoimmune disease; stress, medications, fatigue, genetics, etc.  Bottom line: fungus thrives in a warm, moist environment and your shoes offer such a location.  IS IT CONTAGIOUS? Theoretically, yes.  You do not want to share shoes, nail clippers or files with someone who has fungal toenails.  Walking around barefoot in the same room or sleeping in the same bed is unlikely to transfer the organism.  It is important to realize, however, that fungus can spread easily from one nail to the next on the same foot.  HOW DO WE  TREAT THIS?  There are several ways to treat this condition.  Treatment may depend on many factors such as age, medications, pregnancy, liver and kidney conditions, etc.  It is best to ask your doctor which options are available to you.  1. No treatment.   Unlike many other medical concerns, you can live with this condition.  However for many people this can be a painful condition and may lead to ingrown toenails or a bacterial infection.  It is recommended that you keep the nails cut short to help reduce the amount of fungal nail. 2. Topical treatment.  These range from herbal remedies to prescription strength nail lacquers.  About 40-50% effective, topicals require twice daily application for approximately 9 to 12 months or until an entirely new nail has grown out.  The most effective topicals are medical grade medications available through physicians offices. 3. Oral antifungal medications.  With an 80-90% cure rate, the most common oral medication requires 3 to 4 months of therapy and stays in your system for a year as the new nail grows out.  Oral antifungal medications do require blood work to make sure it is a safe drug for you.  A liver function panel will be performed prior to starting the medication and after the first month of treatment.  It is important to have the blood work performed to avoid any harmful side effects.  In general, this medication safe but blood work is required. 4. Laser Therapy.  This treatment is performed by applying a specialized laser to the affected nail plate.  This therapy is noninvasive, fast, and non-painful.  It is not covered by insurance and is therefore, out of pocket.  The results have been very good with a 80-95% cure rate.  [facility] is the only practice in the area to offer this therapy. 5. Permanent Nail Avulsion.  Removing the entire nail so that a new nail will not grow back.

## 2018-03-14 ENCOUNTER — Encounter: Payer: Self-pay | Admitting: Podiatry

## 2018-03-14 NOTE — Progress Notes (Signed)
Subjective: Bradley Stokes is a 72 y.o. y.o. male who presents today with painful, discolored, thick toenails which he cannot trim and which interfere with daily activities. Pain is aggravated when wearing enclosed shoe gear and is relieved with periodic professional debridement.  He is on blood thinner, warfarin.   Bradley Stokes voices no new problems on today's visit.  Bradley Stokes, Bradley Salinas, MD is his PCP.    Current Outpatient Medications:  .  acetaminophen (TYLENOL) 325 MG tablet, Take 325 mg by mouth every 6 (six) hours as needed (pain). , Disp: , Rfl:  .  allopurinol (ZYLOPRIM) 100 MG tablet, Take 100 mg by mouth daily., Disp: , Rfl:  .  amiodarone (PACERONE) 200 MG tablet, TAKE 1/2 TABLET(100 MG) BY MOUTH DAILY, Disp: 45 tablet, Rfl: 3 .  ammonium lactate (LAC-HYDRIN) 12 % lotion, Apply 1 application topically 2 (two) times daily., Disp: , Rfl:  .  atorvastatin (LIPITOR) 20 MG tablet, Take 20 mg by mouth Daily., Disp: , Rfl:  .  calcitRIOL (ROCALTROL) 0.5 MCG capsule, Take 0.5 mcg daily by mouth. , Disp: , Rfl:  .  desoximetasone (TOPICORT) 0.25 % cream, Apply 1 application topically 2 (two) times daily., Disp: 30 g, Rfl: 0 .  furosemide (LASIX) 20 MG tablet, TAKE 2 TABLETS BY MOUTH TWICE DAILY, Disp: 120 tablet, Rfl: 0 .  warfarin (COUMADIN) 2 MG tablet, TK 1 AND 1/2 TS PO ONCE A DAY, Disp: , Rfl: 0   Allergies  Allergen Reactions  . Other     Tape,needs paper tape     Objective: Vascular Examination: Vascular Examination: Capillary refill time <seconds  x 10 digits Dorsalis pedis and Posterior tibial pulses present b/l Digital hair x 10 digits absent Skin temperature gradient WNL b/l  Dermatological Examination: Skin with normal turgor, texture and tone b/l  Toenails 1-5 b/l discolored, thick, dystrophic with subungual debris and pain with palpation to nailbeds due to thickness of nails.  Incurvated nailplate with nail border hypertrophy and early formation of granulation  tissue to border. No edema, no erythema, no purulence.  Musculoskeletal: Muscle strength 5/5 to all LE muscle groups  Neurological: Sensation intact with 10 gram monofilament. Vibratory sensation intact.  Assessment: 1. Painful onychomycosis toenails 1-5 b/l in patient on blood thinner.  2. Ingrown toenail right great toe lateral border  Plan: 1. Toenails 1-5 b/l were debrided in length and girth without iatrogenic bleeding. Offending nail border debrided and curretaged. Border cleansed with alcohol. TAO applied. Bradley Stokes was instructed to soak right foot once daily for 15 min for one week. Dry foot well and apply Neosporin. He is to call if he has any problems. 2. Patient to continue soft, supportive shoe gear 3. Patient to report any pedal injuries to medical professional immediately. 4. Avoid self trimming due to use of blood thinner. 5. Follow up 3 months. Patient/POA to call should there be a concern in the interim.

## 2018-03-15 DIAGNOSIS — Z7901 Long term (current) use of anticoagulants: Secondary | ICD-10-CM | POA: Diagnosis not present

## 2018-03-15 DIAGNOSIS — I480 Paroxysmal atrial fibrillation: Secondary | ICD-10-CM | POA: Diagnosis not present

## 2018-04-12 DIAGNOSIS — I480 Paroxysmal atrial fibrillation: Secondary | ICD-10-CM | POA: Diagnosis not present

## 2018-04-12 DIAGNOSIS — Z7901 Long term (current) use of anticoagulants: Secondary | ICD-10-CM | POA: Diagnosis not present

## 2018-04-20 DIAGNOSIS — H2512 Age-related nuclear cataract, left eye: Secondary | ICD-10-CM | POA: Diagnosis not present

## 2018-04-20 DIAGNOSIS — H18413 Arcus senilis, bilateral: Secondary | ICD-10-CM | POA: Diagnosis not present

## 2018-04-20 DIAGNOSIS — Z961 Presence of intraocular lens: Secondary | ICD-10-CM | POA: Diagnosis not present

## 2018-04-20 DIAGNOSIS — H25012 Cortical age-related cataract, left eye: Secondary | ICD-10-CM | POA: Diagnosis not present

## 2018-04-20 DIAGNOSIS — H25042 Posterior subcapsular polar age-related cataract, left eye: Secondary | ICD-10-CM | POA: Diagnosis not present

## 2018-05-10 DIAGNOSIS — Z7901 Long term (current) use of anticoagulants: Secondary | ICD-10-CM | POA: Diagnosis not present

## 2018-05-28 ENCOUNTER — Other Ambulatory Visit: Payer: Self-pay

## 2018-05-28 ENCOUNTER — Ambulatory Visit: Payer: Medicare HMO | Admitting: Podiatry

## 2018-05-28 DIAGNOSIS — B351 Tinea unguium: Secondary | ICD-10-CM

## 2018-05-28 DIAGNOSIS — M79674 Pain in right toe(s): Secondary | ICD-10-CM

## 2018-05-28 DIAGNOSIS — M79675 Pain in left toe(s): Secondary | ICD-10-CM

## 2018-05-28 NOTE — Patient Instructions (Signed)

## 2018-05-31 DIAGNOSIS — H52222 Regular astigmatism, left eye: Secondary | ICD-10-CM | POA: Diagnosis not present

## 2018-05-31 DIAGNOSIS — Z961 Presence of intraocular lens: Secondary | ICD-10-CM | POA: Diagnosis not present

## 2018-05-31 DIAGNOSIS — H5202 Hypermetropia, left eye: Secondary | ICD-10-CM | POA: Diagnosis not present

## 2018-05-31 DIAGNOSIS — H2512 Age-related nuclear cataract, left eye: Secondary | ICD-10-CM | POA: Diagnosis not present

## 2018-06-06 ENCOUNTER — Encounter: Payer: Self-pay | Admitting: Podiatry

## 2018-06-06 NOTE — Progress Notes (Signed)
Subjective: Bradley Stokes presents today with painful, thick toenails 1-5 b/l that he cannot cut and which interfere with daily activities.  Pain is aggravated when wearing enclosed shoe gear.  [name], [name], MD is his PCP.   Current Outpatient Medications:  .  acetaminophen (TYLENOL) 325 MG tablet, Take 325 mg by mouth every 6 (six) hours as needed (pain). , Disp: , Rfl:  .  allopurinol (ZYLOPRIM) 100 MG tablet, Take 100 mg by mouth daily., Disp: , Rfl:  .  amiodarone (PACERONE) 200 MG tablet, TAKE 1/2 TABLET(100 MG) BY MOUTH DAILY, Disp: 45 tablet, Rfl: 3 .  ammonium lactate (LAC-HYDRIN) 12 % lotion, Apply 1 application topically 2 (two) times daily., Disp: , Rfl:  .  atorvastatin (LIPITOR) 20 MG tablet, Take 20 mg by mouth Daily., Disp: , Rfl:  .  calcitRIOL (ROCALTROL) 0.5 MCG capsule, Take 0.5 mcg daily by mouth. , Disp: , Rfl:  .  desoximetasone (TOPICORT) 0.25 % cream, Apply 1 application topically 2 (two) times daily., Disp: 30 g, Rfl: 0 .  DUREZOL 0.05 % EMUL, INT 1 GTT IN OS TID UTD, Disp: , Rfl:  .  furosemide (LASIX) 20 MG tablet, TAKE 2 TABLETS BY MOUTH TWICE DAILY, Disp: 120 tablet, Rfl: 0 .  PROLENSA 0.07 % SOLN, INT 1 GTT IN OS HS UTD, Disp: , Rfl:  .  warfarin (COUMADIN) 2 MG tablet, TK 1 AND 1/2 TS PO ONCE A DAY, Disp: , Rfl: 0  Allergies  Allergen Reactions  . Other     Tape,needs paper tape    Objective:  Vascular Examination: Capillary refill time  <3 secibds x 10 digits.  Dorsalis pedis and Posterior tibial pulses palpable b/l.  Digital hair absent x 10 digits.  Skin temperature gradient WNL b/l.  Dermatological Examination: Skin with normal turgor, texture and tone b/l.  Toenails 1-5 b/l discolored, thick, dystrophic with subungual debris and pain with palpation to nailbeds due to thickness of nails.  Musculoskeletal: Muscle strength 5/5 to all LE muscle groups.  No gross bony deformities b/l.  No pain, crepitus or joint limitation noted  with ROM.   Neurological: Sensation intact with 10 gram monofilament.  Vibratory sensation intact.  Assessment: Painful onychomycosis toenails 1-5 b/l  Plan: 1. Toenails 1-5 b/l were debrided in length and girth without iatrogenic bleeding. 2. Patient to continue soft, supportive shoe gear. 3. Patient to report any pedal injuries to medical professional immediately. 4. Follow up 3 months.  5. Patient/POA to call should there be a concern in the interim.

## 2018-06-14 DIAGNOSIS — E7800 Pure hypercholesterolemia, unspecified: Secondary | ICD-10-CM | POA: Diagnosis not present

## 2018-06-14 DIAGNOSIS — N183 Chronic kidney disease, stage 3 (moderate): Secondary | ICD-10-CM | POA: Diagnosis not present

## 2018-06-14 DIAGNOSIS — I480 Paroxysmal atrial fibrillation: Secondary | ICD-10-CM | POA: Diagnosis not present

## 2018-06-14 DIAGNOSIS — Z7901 Long term (current) use of anticoagulants: Secondary | ICD-10-CM | POA: Diagnosis not present

## 2018-06-14 DIAGNOSIS — I10 Essential (primary) hypertension: Secondary | ICD-10-CM | POA: Diagnosis not present

## 2018-06-14 DIAGNOSIS — Z1211 Encounter for screening for malignant neoplasm of colon: Secondary | ICD-10-CM | POA: Diagnosis not present

## 2018-06-25 DIAGNOSIS — E785 Hyperlipidemia, unspecified: Secondary | ICD-10-CM | POA: Diagnosis not present

## 2018-06-25 DIAGNOSIS — N2581 Secondary hyperparathyroidism of renal origin: Secondary | ICD-10-CM | POA: Diagnosis not present

## 2018-06-25 DIAGNOSIS — N189 Chronic kidney disease, unspecified: Secondary | ICD-10-CM | POA: Diagnosis not present

## 2018-06-25 DIAGNOSIS — I4891 Unspecified atrial fibrillation: Secondary | ICD-10-CM | POA: Diagnosis not present

## 2018-06-25 DIAGNOSIS — R600 Localized edema: Secondary | ICD-10-CM | POA: Diagnosis not present

## 2018-06-25 DIAGNOSIS — D631 Anemia in chronic kidney disease: Secondary | ICD-10-CM | POA: Diagnosis not present

## 2018-06-25 DIAGNOSIS — N183 Chronic kidney disease, stage 3 (moderate): Secondary | ICD-10-CM | POA: Diagnosis not present

## 2018-07-29 DIAGNOSIS — N183 Chronic kidney disease, stage 3 (moderate): Secondary | ICD-10-CM | POA: Diagnosis not present

## 2018-08-10 ENCOUNTER — Ambulatory Visit: Payer: Medicare HMO | Admitting: Nurse Practitioner

## 2018-08-16 ENCOUNTER — Ambulatory Visit: Payer: Medicare HMO | Admitting: Nurse Practitioner

## 2018-08-17 DIAGNOSIS — Z7901 Long term (current) use of anticoagulants: Secondary | ICD-10-CM | POA: Diagnosis not present

## 2018-08-17 DIAGNOSIS — I480 Paroxysmal atrial fibrillation: Secondary | ICD-10-CM | POA: Diagnosis not present

## 2018-08-24 ENCOUNTER — Telehealth: Payer: Medicare HMO | Admitting: Nurse Practitioner

## 2018-08-24 ENCOUNTER — Telehealth: Payer: Self-pay | Admitting: *Deleted

## 2018-08-24 NOTE — Telephone Encounter (Signed)
Virtual Visit Pre-Appointment Phone Call  "(Name), I am calling you today to discuss your upcoming appointment. We are currently trying to limit exposure to the virus that causes COVID-19 by seeing patients at home rather than in the office."  1. "What is the BEST phone number to call the day of the visit?" - include this in appointment notes  2. "Do you have or have access to (through a family member/friend) a smartphone with video capability that we can use for your visit?" a. If yes - list this number in appt notes as "cell" (if different from BEST phone #) and list the appointment type as a VIDEO visit in appointment notes b. If no - list the appointment type as a PHONE visit in appointment notes  3. Confirm consent - "In the setting of the current Covid19 crisis, you are scheduled for a (phone or video) visit with your provider on (Wednesday, May 27) at (10:00 ).  Just as we do with many in-office visits, in order for you to participate in this visit, we must obtain consent.  If you'd like, I can send this to your mychart (if signed up) or email for you to review.  Otherwise, I can obtain your verbal consent now.  All virtual visits are billed to your insurance company just like a normal visit would be.  By agreeing to a virtual visit, we'd like you to understand that the technology does not allow for your provider to perform an examination, and thus may limit your provider's ability to fully assess your condition. If your provider identifies any concerns that need to be evaluated in person, we will make arrangements to do so.  Finally, though the technology is pretty good, we cannot assure that it will always work on either your or our end, and in the setting of a video visit, we may have to convert it to a phone-only visit.  In either situation, we cannot ensure that we have a secure connection.  Are you willing to proceed?" STAFF: Did the patient verbally acknowledge consent to telehealth  visit? Document YES/NO here: YES  4. Advise patient to be prepared - "Two hours prior to your appointment, go ahead and check your blood pressure, pulse, oxygen saturation, and your weight (if you have the equipment to check those) and write them all down. When your visit starts, your provider will ask you for this information. If you have an Apple Watch or Kardia device, please plan to have heart rate information ready on the day of your appointment. Please have a pen and paper handy nearby the day of the visit as well."  5. Give patient instructions for MyChart download to smartphone OR Doximity/Doxy.me as below if video visit (depending on what platform provider is using)  6. Inform patient they will receive a phone call 15 minutes prior to their appointment time (may be from unknown caller ID) so they should be prepared to answer    TELEPHONE CALL NOTE  DELOY ARCHEY has been deemed a candidate for a follow-up tele-health visit to limit community exposure during the Covid-19 pandemic. I spoke with the patient via phone to ensure availability of phone/video source, confirm preferred email & phone number, and discuss instructions and expectations.  I reminded BAYLON SANTELLI to be prepared with any vital sign and/or heart rhythm information that could potentially be obtained via home monitoring, at the time of his visit. I reminded BRENTLEY LANDFAIR to expect a phone  call prior to his visit.  Bradley Stokes Bradley Stokes 08/24/2018 9:07 AM   INSTRUCTIONS FOR DOWNLOADING THE MYCHART APP TO SMARTPHONE  - The patient must first make sure to have activated MyChart and know their login information - If Apple, go to [employer] and type in MyChart in the search bar and download the app. If Android, ask patient to go to [employer] and type in [place] in the search bar and download the app. The app is free but as with any other app downloads, their phone may require them to verify saved payment  information or Apple/Android password.  - The patient will need to then log into the app with their MyChart username and password, and select  as their healthcare provider to link the account. When it is time for your visit, go to the MyChart app, find appointments, and click Begin Video Visit. Be sure to Select Allow for your device to access the Microphone and Camera for your visit. You will then be connected, and your provider will be with you shortly.  **If they have any issues connecting, or need assistance please contact MyChart service desk [phone])**  **If using a computer, in order to ensure the best quality for their visit they will need to use either of the following Internet Browsers: [employer], or Google Chrome**  IF USING DOXIMITY or DOXY.ME - The patient will receive a link just prior to their visit by text.     FULL LENGTH CONSENT FOR TELE-HEALTH VISIT   I hereby voluntarily request, consent and authorize [facility] and its employed or contracted physicians, physician assistants, nurse practitioners or other licensed health care professionals (the Practitioner), to provide me with telemedicine health care services (the "Services") as deemed necessary by the treating Practitioner. I acknowledge and consent to receive the Services by the Practitioner via telemedicine. I understand that the telemedicine visit will involve communicating with the Practitioner through live audiovisual communication technology and the disclosure of certain medical information by electronic transmission. I acknowledge that I have been given the opportunity to request an in-person assessment or other available alternative prior to the telemedicine visit and am voluntarily participating in the telemedicine visit.  I understand that I have the right to withhold or withdraw my consent to the use of telemedicine in the course of my care at any time, without affecting my right  to future care or treatment, and that the Practitioner or I may terminate the telemedicine visit at any time. I understand that I have the right to inspect all information obtained and/or recorded in the course of the telemedicine visit and may receive copies of available information for a reasonable fee.  I understand that some of the potential risks of receiving the Services via telemedicine include:  Marland Kitchen Delay or interruption in medical evaluation due to technological equipment failure or disruption; . Information transmitted may not be sufficient (e.g. poor resolution of images) to allow for appropriate medical decision making by the Practitioner; and/or  . In rare instances, security protocols could fail, causing a breach of personal health information.  Furthermore, I acknowledge that it is my responsibility to provide information about my medical history, conditions and care that is complete and accurate to the best of my ability. I acknowledge that Practitioner's advice, recommendations, and/or decision may be based on factors not within their control, such as incomplete or inaccurate data provided by me or distortions of diagnostic images or specimens that may result from  electronic transmissions. I understand that the practice of medicine is not an exact science and that Practitioner makes no warranties or guarantees regarding treatment outcomes. I acknowledge that I will receive a copy of this consent concurrently upon execution via email to the email address I last provided but may also request a printed copy by calling the office of [facility].    I understand that my insurance will be billed for this visit.   I have read or had this consent read to me. . I understand the contents of this consent, which adequately explains the benefits and risks of the Services being provided via telemedicine.  . I have been provided ample opportunity to ask questions regarding this consent and the Services  and have had my questions answered to my satisfaction. . I give my informed consent for the services to be provided through the use of telemedicine in my medical care  By participating in this telemedicine visit I agree to the above.

## 2018-08-24 NOTE — Progress Notes (Signed)
Telehealth Visit     Virtual Visit via Video Note   This visit type was conducted due to national recommendations for restrictions regarding the COVID-19 Pandemic (e.g. social distancing) in an effort to limit this patient's exposure and mitigate transmission in our community.  Due to his co-morbid illnesses, this patient is at least at moderate risk for complications without adequate follow up.  This format is felt to be most appropriate for this patient at this time.  All issues noted in this document were discussed and addressed.  A limited physical exam was performed with this format.  Please refer to the patient's chart for his consent to telehealth for [facility].   Evaluation Performed:  Follow-up visit  This visit type was conducted due to national recommendations for restrictions regarding the COVID-19 Pandemic (e.g. social distancing).  This format is felt to be most appropriate for this patient at this time.  All issues noted in this document were discussed and addressed.  No physical exam was performed (except for noted visual exam findings with Video Visits).  Please refer to the patient's chart (MyChart message for video visits and phone note for telephone visits) for the patient's consent to telehealth for [facility].  Date:  08/25/2018   ID:  Bradley Stokes, DOB 07-Dec-1945, MRN [id number]  Patient Location:  Home  Provider location:   Home  PCP:  [name], [name], MD  Cardiologist:  Servando Snare  Electrophysiologist:  None   Chief Complaint:  Follow up  History of Present Illness:    Bradley Stokes is a 73 y.o. male who presents via audio/video conferencing for a telehealth visit today.  Former patient of Dr. Sherryl Barters. He has elected to follow with me.   He has known PAF, on long term anticoagulationwith Warfarin, on AAD therapy with amiodarone, HTN, gout,morbidobesity, CKD and HLD. His INR is checked by PCP.   I saw himback inNovemberof 2018- felt  to be doing ok. But I did stop his beta blocker - ?junctional on EKG and some dizziness noted. He remains on low dose amiodarone. He had been having a rough time - his sister had been ill and hospitalized - he was trying to take care of her.His ACE has been stopped due to worsening kidney function. Social situation remains very poor. Last seen by me back in November - weight continued to climb. Some extra swelling. Excess salt and sugar intake.   The patient does not have symptoms concerning for COVID-19 infection (fever, chills, cough, or new shortness of breath).   Seen today by telephone conversation. He has consented for this visit. He does not have a smart phone, no Internet, no My Chart account. Only land line phone access. Does not have a BP cuff. His weight is down. He feels like he is doing ok. Has been staying in. Has a friend that goes for them to get groceries. He has been using a mask. INR checked by PCP. Continues to see Renal - less swelling noted - may be on some new medicine -hard to say. But overall, he has no concerns and feels like he is doing well.   Past Medical History:  Diagnosis Date  . Arthritis    severe arthritis and deformity of his knee's  . Chest pain   . Edema   . History of cardiovascular stress test 01/17/2008   EF- 62%  . Hyperlipidemia   . Hypertension   . Kidney stones   . Long-term (current) use of  anticoagulants   . Obesity   . PAF (paroxysmal atrial fibrillation) ([place])    Past Surgical History:  Procedure Laterality Date  . CARDIOLITE STRESS TEST     EF 62%     Current Meds  Medication Sig  . acetaminophen (TYLENOL) 325 MG tablet Take 325 mg by mouth every 6 (six) hours as needed (pain).   Marland Kitchen allopurinol (ZYLOPRIM) 100 MG tablet Take 100 mg by mouth daily.  Marland Kitchen amiodarone (PACERONE) 200 MG tablet TAKE 1/2 TABLET(100 MG) BY MOUTH DAILY  . ammonium lactate (LAC-HYDRIN) 12 % lotion Apply 1 application topically 2 (two) times daily.  Marland Kitchen atorvastatin  (LIPITOR) 20 MG tablet Take 20 mg by mouth Daily.  . calcitRIOL (ROCALTROL) 0.5 MCG capsule Take 0.5 mcg daily by mouth.   . furosemide (LASIX) 20 MG tablet TAKE 2 TABLETS BY MOUTH TWICE DAILY  . K Phos Mono-Sod Phos Di & Mono (VIRT-PHOS 250 NEUTRAL) 155-852-130 MG TABS Take 1 tablet by mouth 2 (two) times a day.   . warfarin (COUMADIN) 2 MG tablet Take 4 mg by mouth one time only at 6 PM.   . [DISCONTINUED] desoximetasone (TOPICORT) 0.25 % cream Apply 1 application topically 2 (two) times daily.  . [DISCONTINUED] DUREZOL 0.05 % EMUL INT 1 GTT IN OS TID UTD  . [DISCONTINUED] PROLENSA 0.07 % SOLN INT 1 GTT IN OS HS UTD     Allergies:   Other   Social History   Tobacco Use  . Smoking status: Never Smoker  . Smokeless tobacco: Never Used  Substance Use Topics  . Alcohol use: No  . Drug use: No     Family Hx: The patient's family history includes Crohn's disease in his sister; Heart disease in his brother; Heart failure in his mother; Kidney failure in his sister; Pneumonia in his mother.  ROS:   Please see the history of present illness.   All other systems reviewed are negative.    Objective:    Vital Signs:  Ht 5\' 9"  (1.753 m)   Wt 293 lb (132.9 kg)   BMI 43.27 kg/m    Wt Readings from Last 3 Encounters:  08/25/18 293 lb (132.9 kg)  02/09/18 (!) 313 lb 6.4 oz (142.2 kg)  10/21/17 (!) 302 lb 12.8 oz (137.3 kg)    Alert male in no acute distress. Not short of breath with conversation.    Labs/Other Tests and Data Reviewed:    Lab Results  Component Value Date   WBC 7.5 02/09/2018   HGB 13.3 02/09/2018   HCT 40.8 02/09/2018   PLT 303 02/09/2018   GLUCOSE 93 02/09/2018   CHOL 130 02/09/2018   TRIG 148 02/09/2018   HDL 53 02/09/2018   LDLCALC 47 02/09/2018   ALT 4 02/09/2018   AST 11 02/09/2018   NA 145 (H) 02/09/2018   K 3.8 02/09/2018   CL 100 02/09/2018   CREATININE 2.00 (H) 02/09/2018   BUN 20 02/09/2018   CO2 25 02/09/2018   TSH 1.190 02/09/2018   INR  2.11 (H) 10/09/2010       BNP (last 3 results) No results for input(s): BNP in the last 8760 hours.  ProBNP (last 3 results) Recent Labs    02/09/18 1047  PROBNP 145      Prior CV studies:    The following studies were reviewed today:  Echo Study Conclusions from 12/2014  - Left ventricle: The cavity size was normal. Wall thickness was  normal. Systolic function was normal.  The estimated ejection  fraction was in the range of 55% to 60%. - Aortic valve: There was mild regurgitation. - Left atrium: The atrium was moderately dilated.     ASSESSMENT & PLAN:    1. PAF - he remains on low dose amiodarone. No palpitations noted.   2. HTN -not able to check BP   3. Hyperlipidemia -he remains on statin - LDL from March 2020 labs is 61  4. CKD - he is managed by Dr. Justin Mend - last creatinine per [facility] is 2.0 - he has regular follow up with Nephrology  5. Chronic anticoagulation - followed by PCP- no problems noted.   6. Morbid obesity - this remains the crux of his issues - weight is down. Encouraged to keep up the good work.   7. COVID-19 Education: The signs and symptoms of COVID-19 were discussed with the patient and how to seek care for testing (follow up with PCP or arrange E-visit).  The importance of social distancing, staying at home, hand hygiene and wearing a mask when out in public were discussed today.  Patient Risk:   After full review of this patient's clinical status, I feel that they are at least moderate risk at this time.  Time:   Today, I have spent 9 minutes with the patient with telehealth technology discussing the above issues.     Medication Adjustments/Labs and Tests Ordered: Current medicines are reviewed at length with the patient today.  Concerns regarding medicines are outlined above.   Tests Ordered: No orders of the defined types were placed in this encounter.   Medication Changes: No orders of the defined types were placed  in this encounter.   Disposition:  FU with me in about 3 months in the office with EKG and labs.   Patient is agreeable to this plan and will call if any problems develop in the interim.   Amie Critchley, NP  08/25/2018 10:15 AM    [place]

## 2018-08-25 ENCOUNTER — Encounter: Payer: Self-pay | Admitting: Nurse Practitioner

## 2018-08-25 ENCOUNTER — Telehealth (INDEPENDENT_AMBULATORY_CARE_PROVIDER_SITE_OTHER): Payer: Medicare HMO | Admitting: Nurse Practitioner

## 2018-08-25 ENCOUNTER — Other Ambulatory Visit: Payer: Self-pay

## 2018-08-25 VITALS — Ht 69.0 in | Wt 293.0 lb

## 2018-08-25 DIAGNOSIS — I480 Paroxysmal atrial fibrillation: Secondary | ICD-10-CM

## 2018-08-25 DIAGNOSIS — E7800 Pure hypercholesterolemia, unspecified: Secondary | ICD-10-CM

## 2018-08-25 DIAGNOSIS — Z7901 Long term (current) use of anticoagulants: Secondary | ICD-10-CM

## 2018-08-25 DIAGNOSIS — Z79899 Other long term (current) drug therapy: Secondary | ICD-10-CM

## 2018-08-25 DIAGNOSIS — I129 Hypertensive chronic kidney disease with stage 1 through stage 4 chronic kidney disease, or unspecified chronic kidney disease: Secondary | ICD-10-CM

## 2018-08-25 DIAGNOSIS — N189 Chronic kidney disease, unspecified: Secondary | ICD-10-CM | POA: Diagnosis not present

## 2018-08-25 DIAGNOSIS — I119 Hypertensive heart disease without heart failure: Secondary | ICD-10-CM

## 2018-08-25 DIAGNOSIS — E785 Hyperlipidemia, unspecified: Secondary | ICD-10-CM | POA: Diagnosis not present

## 2018-08-25 DIAGNOSIS — Z7189 Other specified counseling: Secondary | ICD-10-CM

## 2018-08-25 NOTE — Patient Instructions (Addendum)
After Visit Summary:  We will be checking the following labs today - None   Medication Instructions:    Continue with your current medicines.    If you need a refill on your cardiac medications before your next appointment, please call your pharmacy.     Testing/Procedures To Be Arranged:  N/A  Follow-Up:   See me in about 3 months with EKG    At [facility], you and your health needs are our priority.  As part of our continuing mission to provide you with exceptional heart care, we have created designated Provider Care Teams.  These Care Teams include your primary Cardiologist (physician) and Advanced Practice Providers (APPs -  Physician Assistants and Nurse Practitioners) who all work together to provide you with the care you need, when you need it.  Special Instructions:  . Stay safe, stay home, wash your hands for at least 20 seconds and wear a mask when out in public.  . It was good to talk with you today.    Call the [place] office at [phone] if you have any questions, problems or concerns.

## 2018-08-27 ENCOUNTER — Ambulatory Visit: Payer: Medicare HMO | Admitting: Podiatry

## 2018-09-13 DIAGNOSIS — I480 Paroxysmal atrial fibrillation: Secondary | ICD-10-CM | POA: Diagnosis not present

## 2018-09-13 DIAGNOSIS — Z7901 Long term (current) use of anticoagulants: Secondary | ICD-10-CM | POA: Diagnosis not present

## 2018-10-25 DIAGNOSIS — I480 Paroxysmal atrial fibrillation: Secondary | ICD-10-CM | POA: Diagnosis not present

## 2018-10-25 DIAGNOSIS — Z7901 Long term (current) use of anticoagulants: Secondary | ICD-10-CM | POA: Diagnosis not present

## 2018-11-01 DIAGNOSIS — Z7901 Long term (current) use of anticoagulants: Secondary | ICD-10-CM | POA: Diagnosis not present

## 2018-11-20 NOTE — Progress Notes (Signed)
CARDIOLOGY OFFICE NOTE  Date:  11/24/2018    [name] Date of Birth: 12/14/1945 Medical Record #[id number]  PCP:  [name], MD  Cardiologist:  Servando Snare    Chief Complaint  Patient presents with  . Follow-up    History of Present Illness: Bradley Stokes is a 73 y.o. male who presents today for a follow up visit. Former patient of Dr. Sherryl Barters. He has elected to follow with me.   He has known PAF, on long term anticoagulationwith Warfarin, on AAD therapy with amiodarone, HTN, gout,morbidobesity, CKD and HLD. His INR is checked by PCP.   I saw himback inNovemberof 2018- felt to be doing ok. But I did stop his beta blocker - ?junctional on EKG and some dizziness noted. Heremainson low dose amiodarone. He had been having a rough time - his sister had been ill and hospitalized - he was trying to take care of her.His ACE has been stopped due to worsening kidney function. Social situation remains very poor. We were able to do a telehealth visit back in May - was doing ok for the most part.   The patient does not have symptoms concerning for COVID-19 infection (fever, chills, cough, or new shortness of breath).   Comes in today. Here alone. He is actually trying to walk in here today - very hard for him to do - using a cane. He says he is doing ok. Just had a new A/C unit put in - had no air for most of the month otherwise. Staying home. He has someone that gets his groceries. No chest pain. Breathing is stable for his. He says his swelling is unchanged. Not dizzy. No falls. He feels like he is doing ok. Has no real concern.   Past Medical History:  Diagnosis Date  . Arthritis    severe arthritis and deformity of his knee's  . Chest pain   . Edema   . History of cardiovascular stress test 01/17/2008   EF- 62%  . Hyperlipidemia   . Hypertension   . Kidney stones   . Long-term (current) use of anticoagulants   . Obesity   . PAF (paroxysmal atrial  fibrillation) ([place])     Past Surgical History:  Procedure Laterality Date  . CARDIOLITE STRESS TEST     EF 62%     Medications: Current Meds  Medication Sig  . acetaminophen (TYLENOL) 325 MG tablet Take 325 mg by mouth every 6 (six) hours as needed (pain).   Marland Kitchen allopurinol (ZYLOPRIM) 100 MG tablet Take 100 mg by mouth daily.  Marland Kitchen amiodarone (PACERONE) 200 MG tablet TAKE 1/2 TABLET(100 MG) BY MOUTH DAILY  . ammonium lactate (LAC-HYDRIN) 12 % lotion Apply 1 application topically 2 (two) times daily.  Marland Kitchen atorvastatin (LIPITOR) 20 MG tablet Take 20 mg by mouth Daily.  . calcitRIOL (ROCALTROL) 0.5 MCG capsule Take 0.5 mcg daily by mouth.   . furosemide (LASIX) 20 MG tablet TAKE 2 TABLETS BY MOUTH TWICE DAILY  . K Phos Mono-Sod Phos Di & Mono (VIRT-PHOS 250 NEUTRAL) 155-852-130 MG TABS Take 1 tablet by mouth 2 (two) times a day.   . warfarin (COUMADIN) 2 MG tablet Take 4 mg by mouth one time only at 6 PM.      Allergies: Allergies  Allergen Reactions  . Other     Tape,needs paper tape    Social History: The patient  reports that he has never smoked. He has never used smokeless tobacco.  He reports that he does not drink alcohol or use drugs.   Family History: The patient's family history includes Crohn's disease in his sister; Heart disease in his brother; Heart failure in his mother; Kidney failure in his sister; Pneumonia in his mother.   Review of Systems: Please see the history of present illness.   All other systems are reviewed and negative.   Physical Exam: VS:  BP (!) 144/80   Pulse 73   Ht 5\' 9"  (1.753 m)   Wt 297 lb 12.8 oz (135.1 kg)   SpO2 92%   BMI 43.98 kg/m  .  BMI Body mass index is 43.98 kg/m.  Wt Readings from Last 3 Encounters:  11/24/18 297 lb 12.8 oz (135.1 kg)  08/25/18 293 lb (132.9 kg)  02/09/18 (!) 313 lb 6.4 oz (142.2 kg)    General:  Alert and in no acute distress. Morbidly obese. His weight is down from his last visit here in person.   HEENT:  Normal.  Neck: Supple, no JVD, carotid bruits, or masses noted.  Cardiac: Regular rate and rhythm. No murmurs, rubs, or gallops. Legs are chronically full.  Respiratory:  Lungs are clear to auscultation bilaterally with normal work of breathing.  GI: Soft and nontender.  MS: No deformity or atrophy. Gait and ROM intact.  Skin: Warm and dry. Color is normal.  Neuro:  Strength and sensation are intact and no gross focal deficits noted.  Psych: Alert, appropriate and with normal affect.   LABORATORY DATA:  EKG:  EKG is ordered today. This demonstrates NSR - 73.  Lab Results  Component Value Date   WBC 7.5 02/09/2018   HGB 13.3 02/09/2018   HCT 40.8 02/09/2018   PLT 303 02/09/2018   GLUCOSE 93 02/09/2018   CHOL 130 02/09/2018   TRIG 148 02/09/2018   HDL 53 02/09/2018   LDLCALC 47 02/09/2018   ALT 4 02/09/2018   AST 11 02/09/2018   NA 145 (H) 02/09/2018   K 3.8 02/09/2018   CL 100 02/09/2018   CREATININE 2.00 (H) 02/09/2018   BUN 20 02/09/2018   CO2 25 02/09/2018   TSH 1.190 02/09/2018   INR 2.11 (H) 10/09/2010       BNP (last 3 results) No results for input(s): BNP in the last 8760 hours.  ProBNP (last 3 results) Recent Labs    02/09/18 1047  PROBNP 145     Other Studies Reviewed Today:  Echo Study Conclusions from 12/2014  - Left ventricle: The cavity size was normal. Wall thickness was  normal. Systolic function was normal. The estimated ejection  fraction was in the range of 55% to 60%. - Aortic valve: There was mild regurgitation. - Left atrium: The atrium was moderately dilated.     ASSESSMENT & PLAN:    1. PAF - remains in NSR - on low dose Amiodarone - needs surveillance labs today. I have left him on his current regimen.    2. HTN - BP is fair - will follow.   3. HLD - on statin - labs today  4. CKD - sees Dr. Justin Mend - lab today   5. Chronic anticoagulation - followed by PCP - no problems noted - CBC today  6. Morbid obesity -  remains the crux of his issues.   7. COVID-19 Education: The signs and symptoms of COVID-19 were discussed with the patient and how to seek care for testing (follow up with PCP or arrange E-visit).  The importance of  social distancing, staying at home, hand hygiene and wearing a mask when out in public were discussed today.  Current medicines are reviewed with the patient today.  The patient does not have concerns regarding medicines other than what has been noted above.  The following changes have been made:  See above.  Labs/ tests ordered today include:    Orders Placed This Encounter  Procedures  . Basic metabolic panel  . CBC  . Hepatic function panel  . Lipid panel  . TSH  . EKG 12-Lead     Disposition:   FU with me in about 6 months with EKG.   Patient is agreeable to this plan and will call if any problems develop in the interim.   SignedTruitt Merle, NP  11/24/2018 10:39 AM  [facility] [address] [place], [place]  [address] Phone: [phone] Fax: [phone]

## 2018-11-24 ENCOUNTER — Encounter: Payer: Self-pay | Admitting: Nurse Practitioner

## 2018-11-24 ENCOUNTER — Other Ambulatory Visit: Payer: Self-pay

## 2018-11-24 ENCOUNTER — Encounter (INDEPENDENT_AMBULATORY_CARE_PROVIDER_SITE_OTHER): Payer: Self-pay

## 2018-11-24 ENCOUNTER — Ambulatory Visit (INDEPENDENT_AMBULATORY_CARE_PROVIDER_SITE_OTHER): Payer: Medicare HMO | Admitting: Nurse Practitioner

## 2018-11-24 VITALS — BP 144/80 | HR 73 | Ht 69.0 in | Wt 297.8 lb

## 2018-11-24 DIAGNOSIS — I480 Paroxysmal atrial fibrillation: Secondary | ICD-10-CM

## 2018-11-24 DIAGNOSIS — Z79899 Other long term (current) drug therapy: Secondary | ICD-10-CM | POA: Diagnosis not present

## 2018-11-24 LAB — CBC
Hematocrit: 40.3 % (ref 37.5–51.0)
Hemoglobin: 14.1 g/dL (ref 13.0–17.7)
MCH: 33.7 pg — ABNORMAL HIGH (ref 26.6–33.0)
MCHC: 35 g/dL (ref 31.5–35.7)
MCV: 96 fL (ref 79–97)
Platelets: 280 10*3/uL (ref 150–450)
RBC: 4.19 x10E6/uL (ref 4.14–5.80)
RDW: 13.5 % (ref 11.6–15.4)
WBC: 7.7 10*3/uL (ref 3.4–10.8)

## 2018-11-24 LAB — BASIC METABOLIC PANEL
BUN/Creatinine Ratio: 12 (ref 10–24)
BUN: 22 mg/dL (ref 8–27)
CO2: 24 mmol/L (ref 20–29)
Calcium: 8.8 mg/dL (ref 8.6–10.2)
Chloride: 100 mmol/L (ref 96–106)
Creatinine, Ser: 1.85 mg/dL — ABNORMAL HIGH (ref 0.76–1.27)
GFR calc Af Amer: 40 mL/min/{1.73_m2} — ABNORMAL LOW (ref >59)
GFR calc non Af Amer: 35 mL/min/{1.73_m2} — ABNORMAL LOW (ref >59)
Glucose: 98 mg/dL (ref 65–99)
Potassium: 3 mmol/L — ABNORMAL LOW (ref 3.5–5.2)
Sodium: 142 mmol/L (ref 134–144)

## 2018-11-24 LAB — LIPID PANEL
Chol/HDL Ratio: 2.5 ratio (ref 0.0–5.0)
Cholesterol, Total: 109 mg/dL (ref 100–199)
HDL: 43 mg/dL (ref >39)
LDL Calculated: 42 mg/dL (ref 0–99)
Triglycerides: 120 mg/dL (ref 0–149)
VLDL Cholesterol Cal: 24 mg/dL (ref 5–40)

## 2018-11-24 LAB — HEPATIC FUNCTION PANEL
ALT: 3 IU/L (ref 0–44)
AST: 10 IU/L (ref 0–40)
Albumin: 3.9 g/dL (ref 3.7–4.7)
Alkaline Phosphatase: 128 IU/L — ABNORMAL HIGH (ref 39–117)
Bilirubin Total: 1.1 mg/dL (ref 0.0–1.2)
Bilirubin, Direct: 0.32 mg/dL (ref 0.00–0.40)
Total Protein: 6.6 g/dL (ref 6.0–8.5)

## 2018-11-24 LAB — TSH: TSH: 0.921 u[IU]/mL (ref 0.450–4.500)

## 2018-11-24 NOTE — Patient Instructions (Signed)
After Visit Summary:  We will be checking the following labs today - BMET, CBC, HPF, Lipids and TSH   Medication Instructions:    Continue with your current medicines.    If you need a refill on your cardiac medications before your next appointment, please call your pharmacy.     Testing/Procedures To Be Arranged:  N/A  Follow-Up:   See me in 6 months with EKG    At [facility], you and your health needs are our priority.  As part of our continuing mission to provide you with exceptional heart care, we have created designated Provider Care Teams.  These Care Teams include your primary Cardiologist (physician) and Advanced Practice Providers (APPs -  Physician Assistants and Nurse Practitioners) who all work together to provide you with the care you need, when you need it.  Special Instructions:  . Stay safe, stay home, wash your hands for at least 20 seconds and wear a mask when out in public.  . It was good to talk with you today.    Call the [place] office at [phone] if you have any questions, problems or concerns.

## 2018-11-25 ENCOUNTER — Other Ambulatory Visit: Payer: Self-pay | Admitting: *Deleted

## 2018-11-25 ENCOUNTER — Telehealth: Payer: Self-pay

## 2018-11-25 ENCOUNTER — Other Ambulatory Visit: Payer: Self-pay

## 2018-11-25 ENCOUNTER — Telehealth: Payer: Self-pay | Admitting: *Deleted

## 2018-11-25 DIAGNOSIS — E876 Hypokalemia: Secondary | ICD-10-CM

## 2018-11-25 MED ORDER — POTASSIUM CHLORIDE CRYS ER 20 MEQ PO TBCR: EXTENDED_RELEASE_TABLET | ORAL | 3 refills | Status: DC

## 2018-11-25 NOTE — Telephone Encounter (Signed)
[facility] Visit Initial Request  Date of Request ([place]):  November 25, 2018  Requesting Provider:  Kathrene Alu  Agency Requested:    [facility] Contact:  Glory Buff, NP [address] [place], [place] [address] Phone #:  [phone] Fax #:  [phone]  Patient Demographic Information: Name:  AKEEM HEPPLER Age:  73 y.o.   DOB:  [birth year]  MRN:  [id number]   Address:   [place] [address]   Phone Numbers:   Home Phone [phone]  Work Phone [phone]     Emergency Contact Information on File:   Contact Information    Name Relation Home Work [place] Sister [phone]        The above family members may be contacted for information on this patient (review DPR on file):     Patient Clinical Information:  Primary Care Provider:  [name], MD  Primary Cardiologist:  No primary care provider on file.  Primary Electrophysiologist:  None   Past Medical Hx: Mr. Conwell  has a past medical history of Arthritis, Chest pain, Edema, History of cardiovascular stress test (01/17/2008), Hyperlipidemia, Hypertension, Kidney stones, Long-term (current) use of anticoagulants, Obesity, and PAF (paroxysmal atrial fibrillation) ([place]).   Allergies: He is allergic to other.   Medications: Current Outpatient Medications on File Prior to Visit  Medication Sig  . acetaminophen (TYLENOL) 325 MG tablet Take 325 mg by mouth every 6 (six) hours as needed (pain).   Marland Kitchen allopurinol (ZYLOPRIM) 100 MG tablet Take 100 mg by mouth daily.  Marland Kitchen amiodarone (PACERONE) 200 MG tablet TAKE 1/2 TABLET(100 MG) BY MOUTH DAILY  . ammonium lactate (LAC-HYDRIN) 12 % lotion Apply 1 application topically 2 (two) times daily.  Marland Kitchen atorvastatin (LIPITOR) 20 MG tablet Take 20 mg by mouth Daily.  . calcitRIOL (ROCALTROL) 0.5 MCG capsule Take 0.5 mcg daily by mouth.   . furosemide (LASIX) 20 MG tablet TAKE 2 TABLETS BY MOUTH  TWICE DAILY  . K Phos Mono-Sod Phos Di & Mono (VIRT-PHOS 250 NEUTRAL) 155-852-130 MG TABS Take 1 tablet by mouth 2 (two) times a day.   . warfarin (COUMADIN) 2 MG tablet Take 4 mg by mouth one time only at 6 PM.    No current facility-administered medications on file prior to visit.      Social Hx: He  reports that he has never smoked. He has never used smokeless tobacco. He reports that he does not drink alcohol or use drugs.    Diagnosis/Reason for Visit:   hypokalemia  Services Requested:  Physical Exam  Labs:  bmet  # of Visits Needed/Frequency per Week: 1  A copy of the office note will be faxed with this form. All labs ordered for this home visit have been released and the request was sent to Chrissie Noa at [facility].

## 2018-11-25 NOTE — Telephone Encounter (Signed)
Spoke to Bradley Stokes about a home visit for lab draw and physical exam.  He stated he just started taking his Bradley Stokes today and thought he was told his labs would be drawn next week.  After confirming w/[facility], his BMET does need to be drawn next week Wed or Thurs.  Will confirm w/Bradley Stokes.

## 2018-11-25 NOTE — Telephone Encounter (Signed)
Attempted to call Nurse Wellington regarding lab order, lmtcb

## 2018-11-25 NOTE — Telephone Encounter (Signed)
° ° °  Please return call to Vanita Ingles at [phone] or email [email or web address] to clarify lab order

## 2018-11-26 ENCOUNTER — Telehealth: Payer: Self-pay

## 2018-11-26 NOTE — Telephone Encounter (Signed)
Spoke with Nurse Tedra Coupe on 8/27 to verify pts lab orders that need to be drawn next week

## 2018-12-01 DIAGNOSIS — I480 Paroxysmal atrial fibrillation: Secondary | ICD-10-CM | POA: Diagnosis not present

## 2018-12-01 DIAGNOSIS — E876 Hypokalemia: Secondary | ICD-10-CM | POA: Diagnosis not present

## 2018-12-01 DIAGNOSIS — Z7901 Long term (current) use of anticoagulants: Secondary | ICD-10-CM | POA: Diagnosis not present

## 2018-12-01 NOTE — Progress Notes (Signed)
Bradley Stokes [place])       DOB: [birth year]  Purpose of Visit: Exam, VS, BMET Ordering provider: Truitt Merle, NP  Medications: Is the patient taking all medications listed on MAR from Epic? Yes, but warfarin has been changed to 2mg  x 2tabs on MWF, 2mg  x 2.5tabs on TuThSaSu List any medications that are not being taken correctly:none  List any medication refills needed:none  Is the patient able to pick up medications? Yes  Vitals: BP: 152/80 RT arm    HR:  67     Oxygen:  96%RA Weight:  292lbs stated from last MD visit x 1 week ago.        Physical Exam:  Lung sounds: clear  Heart sounds: regular  Peripheral edema: yes RLE > LLE, states it's WNL for him.  Denies shob  Wounds: no  Location:  Any patient concerns?  States his edema is WNL for him.  States he's been watching his sodium intake but does eat fast food a few times/ month.    ReDS Vest/Clip Reading: n/a  Rhythm Strip:n/a  Is Home Health recommended? No If yes, state reason:   BMET drawn and taken to [facility] on [address]   [place], [place] 12/01/18

## 2018-12-02 ENCOUNTER — Telehealth: Payer: Self-pay

## 2018-12-02 DIAGNOSIS — Z79899 Other long term (current) drug therapy: Secondary | ICD-10-CM

## 2018-12-02 DIAGNOSIS — N189 Chronic kidney disease, unspecified: Secondary | ICD-10-CM

## 2018-12-02 DIAGNOSIS — E876 Hypokalemia: Secondary | ICD-10-CM

## 2018-12-02 LAB — BASIC METABOLIC PANEL
BUN/Creatinine Ratio: 9 — ABNORMAL LOW (ref 10–24)
BUN: 19 mg/dL (ref 8–27)
CO2: 26 mmol/L (ref 20–29)
Calcium: 8.9 mg/dL (ref 8.6–10.2)
Chloride: 100 mmol/L (ref 96–106)
Creatinine, Ser: 2.03 mg/dL — ABNORMAL HIGH (ref 0.76–1.27)
GFR calc Af Amer: 36 mL/min/{1.73_m2} — ABNORMAL LOW (ref >59)
GFR calc non Af Amer: 31 mL/min/{1.73_m2} — ABNORMAL LOW (ref >59)
Glucose: 112 mg/dL — ABNORMAL HIGH (ref 65–99)
Potassium: 4.2 mmol/L (ref 3.5–5.2)
Sodium: 144 mmol/L (ref 134–144)

## 2018-12-02 NOTE — Telephone Encounter (Signed)
Pt verbalized understanding of his lab results. Will return 01/05/19 for repeat bmet.

## 2018-12-02 NOTE — Telephone Encounter (Signed)
-----   Message from Burtis Junes, NP sent at 12/02/2018  7:20 AM EDT ----- Ok to report. Labs are stable - potassium level has improved - noted that the specimen was hemolyzed - his potassium value may in fact be actually lower than what is resulted. BMET in one month Otherwise, would continue on current regimen.

## 2018-12-10 NOTE — Progress Notes (Signed)
As of Friday AM, I do not have his labs.   Can we check on this?  Bradley Stokes

## 2018-12-22 DIAGNOSIS — I480 Paroxysmal atrial fibrillation: Secondary | ICD-10-CM | POA: Diagnosis not present

## 2018-12-22 DIAGNOSIS — Z7901 Long term (current) use of anticoagulants: Secondary | ICD-10-CM | POA: Diagnosis not present

## 2019-01-03 ENCOUNTER — Other Ambulatory Visit: Payer: Medicare HMO | Admitting: *Deleted

## 2019-01-03 ENCOUNTER — Other Ambulatory Visit: Payer: Self-pay

## 2019-01-03 DIAGNOSIS — Z79899 Other long term (current) drug therapy: Secondary | ICD-10-CM

## 2019-01-03 DIAGNOSIS — N189 Chronic kidney disease, unspecified: Secondary | ICD-10-CM

## 2019-01-03 DIAGNOSIS — E876 Hypokalemia: Secondary | ICD-10-CM | POA: Diagnosis not present

## 2019-01-03 LAB — BASIC METABOLIC PANEL
BUN/Creatinine Ratio: 13 (ref 10–24)
BUN: 25 mg/dL (ref 8–27)
CO2: 23 mmol/L (ref 20–29)
Calcium: 9.4 mg/dL (ref 8.6–10.2)
Chloride: 101 mmol/L (ref 96–106)
Creatinine, Ser: 1.92 mg/dL — ABNORMAL HIGH (ref 0.76–1.27)
GFR calc Af Amer: 39 mL/min/{1.73_m2} — ABNORMAL LOW (ref >59)
GFR calc non Af Amer: 33 mL/min/{1.73_m2} — ABNORMAL LOW (ref >59)
Glucose: 98 mg/dL (ref 65–99)
Potassium: 4.2 mmol/L (ref 3.5–5.2)
Sodium: 141 mmol/L (ref 134–144)

## 2019-01-05 ENCOUNTER — Other Ambulatory Visit: Payer: Medicare HMO

## 2019-01-12 DIAGNOSIS — Z23 Encounter for immunization: Secondary | ICD-10-CM | POA: Diagnosis not present

## 2019-01-12 IMAGING — DX DG CHEST 2V
2 series · 2 of 2 positions shown · non-contrast
Comparison: 01/02/2015

CLINICAL DATA: Amiodarone, hypertension, high risk medication use,
paroxysmal atrial fibrillation, hyperlipidemia

EXAM:
CHEST  2 VIEW

[dg chest 2 view (1 of 2)]
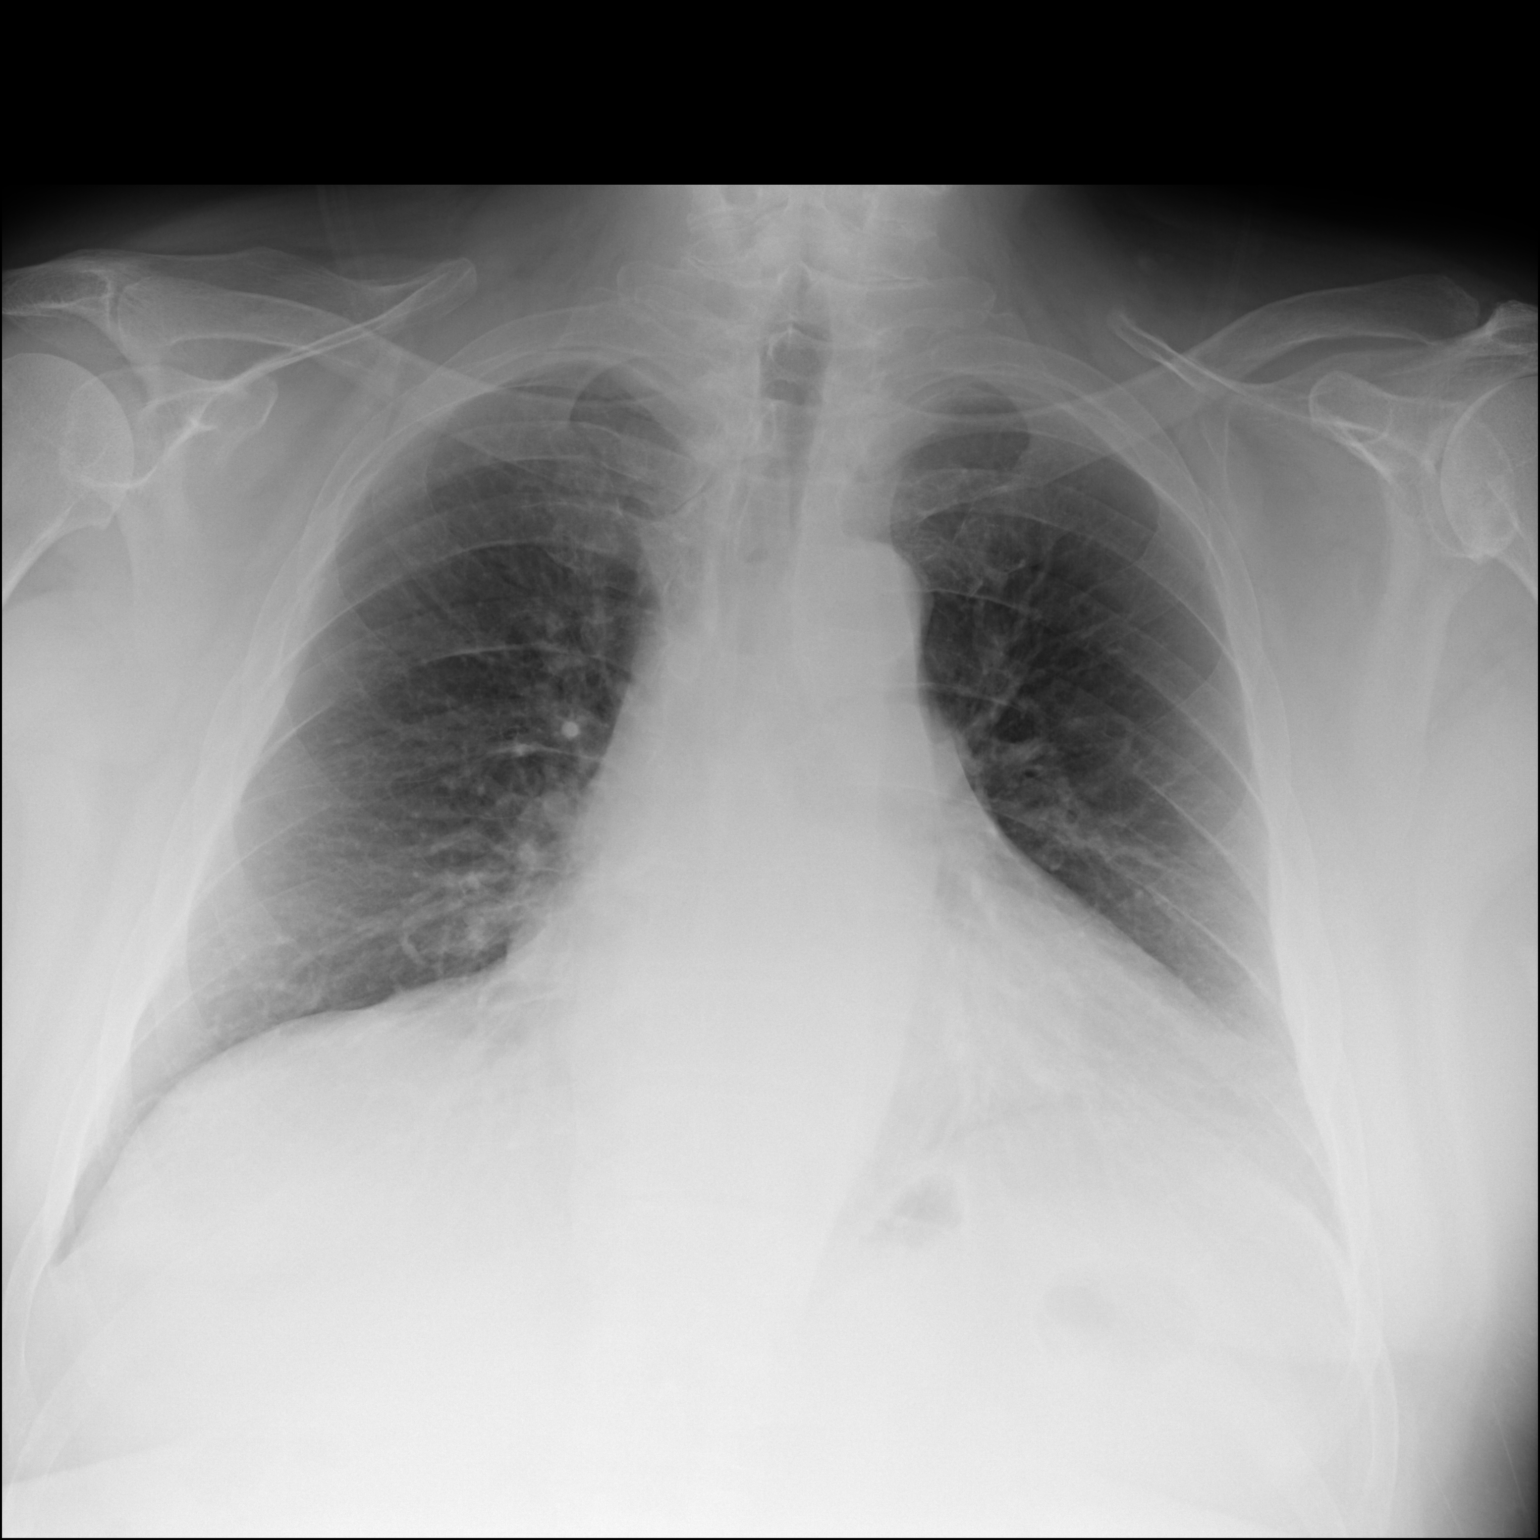

[dg chest 2 view (2 of 2)]
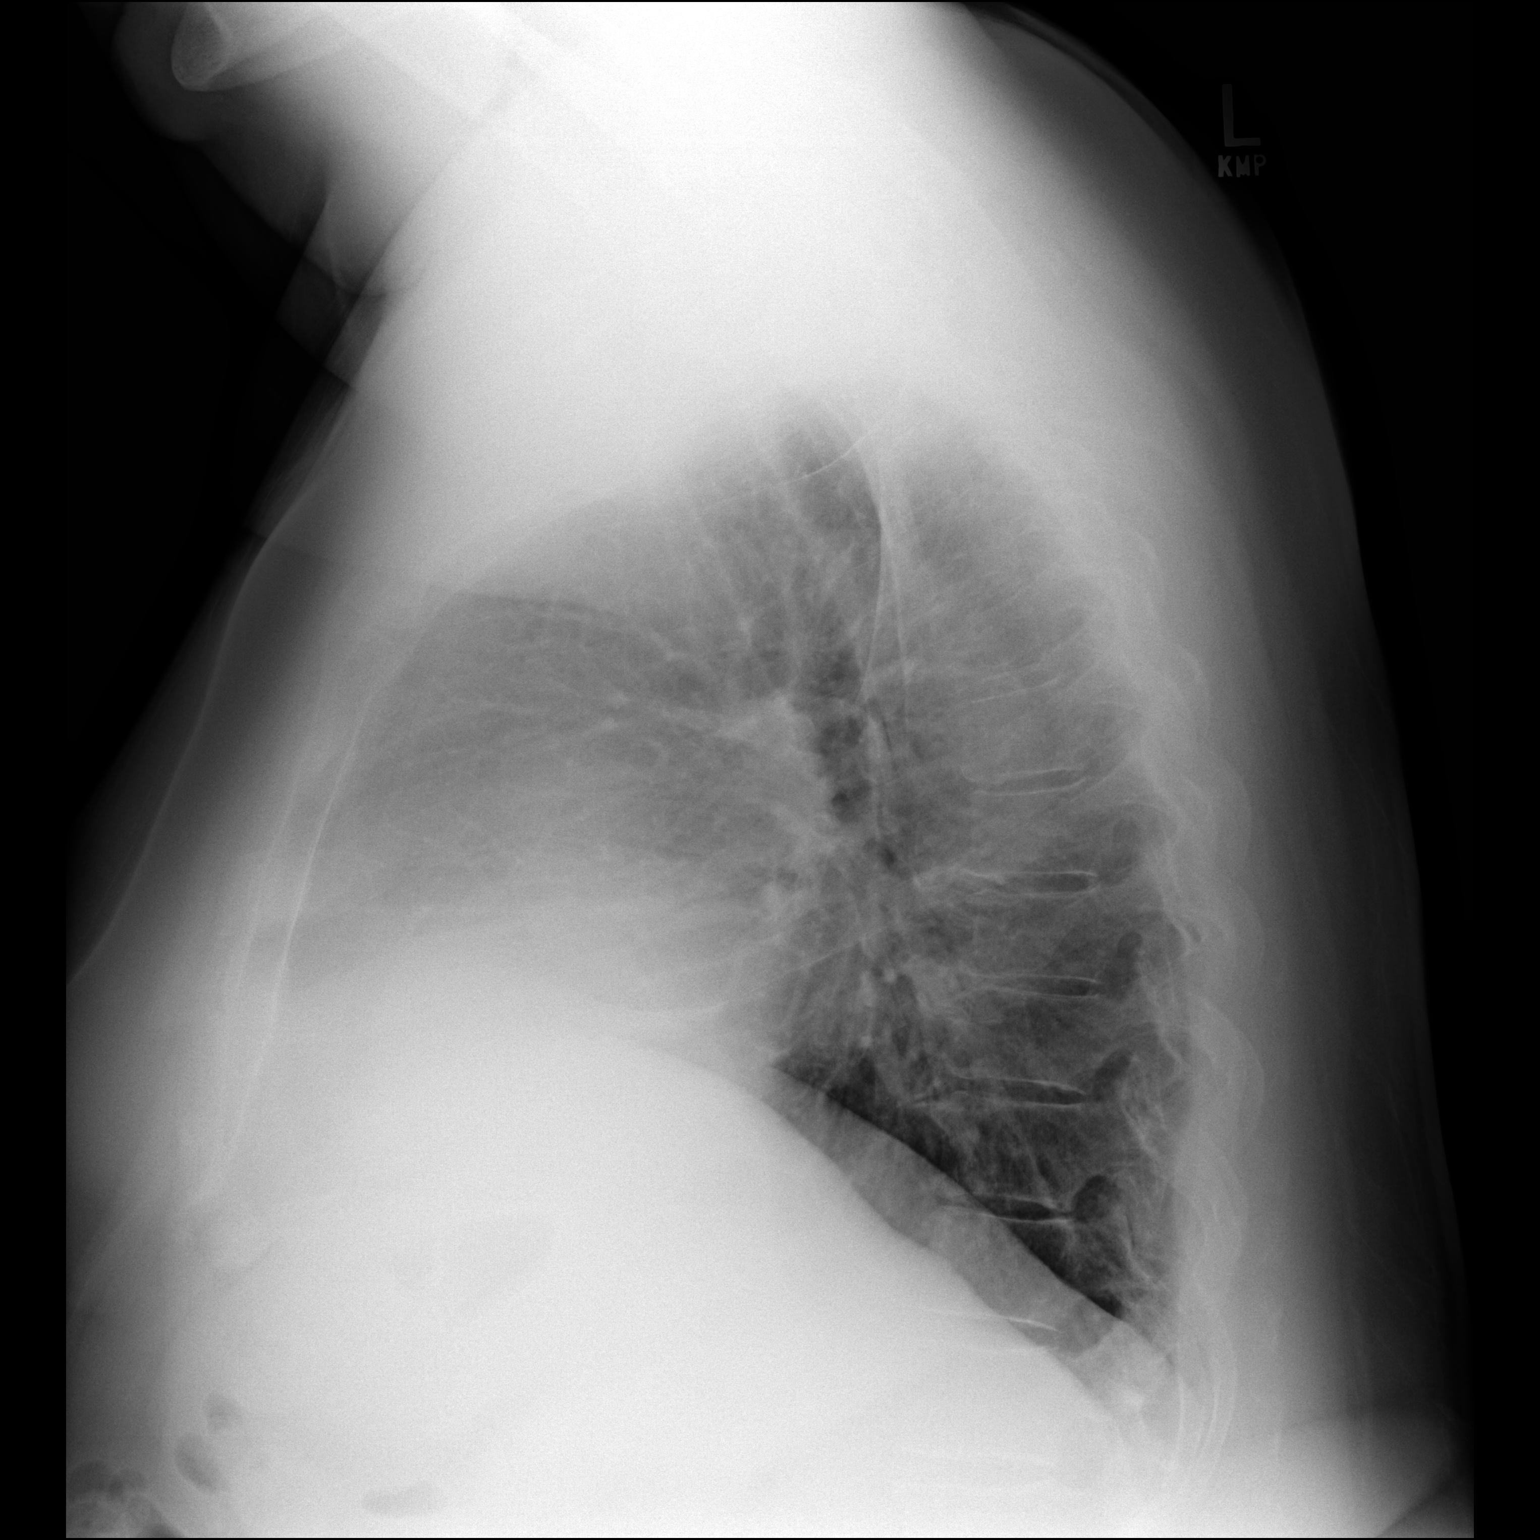

[2 of 2 positions shown; findings below may reference images not displayed]

FINDINGS: Enlargement of cardiac silhouette.

Mediastinal contours and pulmonary vascularity normal.

Atherosclerotic calcification aorta.

Lungs clear.

No pleural effusion or pneumothorax.

Bones unremarkable.
IMPRESSION: Enlargement of cardiac silhouette.

Aortic atherosclerosis.

## 2019-01-21 DIAGNOSIS — Z0100 Encounter for examination of eyes and vision without abnormal findings: Secondary | ICD-10-CM | POA: Diagnosis not present

## 2019-01-28 ENCOUNTER — Other Ambulatory Visit: Payer: Self-pay | Admitting: Nurse Practitioner

## 2019-01-28 DIAGNOSIS — Z7901 Long term (current) use of anticoagulants: Secondary | ICD-10-CM

## 2019-01-28 DIAGNOSIS — I119 Hypertensive heart disease without heart failure: Secondary | ICD-10-CM

## 2019-01-28 DIAGNOSIS — N189 Chronic kidney disease, unspecified: Secondary | ICD-10-CM

## 2019-01-28 DIAGNOSIS — I480 Paroxysmal atrial fibrillation: Secondary | ICD-10-CM

## 2019-01-28 DIAGNOSIS — Z79899 Other long term (current) drug therapy: Secondary | ICD-10-CM

## 2019-02-02 DIAGNOSIS — I480 Paroxysmal atrial fibrillation: Secondary | ICD-10-CM | POA: Diagnosis not present

## 2019-02-02 DIAGNOSIS — Z7901 Long term (current) use of anticoagulants: Secondary | ICD-10-CM | POA: Diagnosis not present

## 2019-02-03 DIAGNOSIS — E785 Hyperlipidemia, unspecified: Secondary | ICD-10-CM | POA: Diagnosis not present

## 2019-02-03 DIAGNOSIS — Z6841 Body Mass Index (BMI) 40.0 and over, adult: Secondary | ICD-10-CM | POA: Diagnosis not present

## 2019-02-03 DIAGNOSIS — N189 Chronic kidney disease, unspecified: Secondary | ICD-10-CM | POA: Diagnosis not present

## 2019-02-03 DIAGNOSIS — N183 Chronic kidney disease, stage 3 unspecified: Secondary | ICD-10-CM | POA: Diagnosis not present

## 2019-02-03 DIAGNOSIS — D631 Anemia in chronic kidney disease: Secondary | ICD-10-CM | POA: Diagnosis not present

## 2019-02-03 DIAGNOSIS — N2581 Secondary hyperparathyroidism of renal origin: Secondary | ICD-10-CM | POA: Diagnosis not present

## 2019-02-03 DIAGNOSIS — E876 Hypokalemia: Secondary | ICD-10-CM | POA: Diagnosis not present

## 2019-02-03 DIAGNOSIS — I129 Hypertensive chronic kidney disease with stage 1 through stage 4 chronic kidney disease, or unspecified chronic kidney disease: Secondary | ICD-10-CM | POA: Diagnosis not present

## 2019-02-03 DIAGNOSIS — I4891 Unspecified atrial fibrillation: Secondary | ICD-10-CM | POA: Diagnosis not present

## 2019-03-23 DIAGNOSIS — I480 Paroxysmal atrial fibrillation: Secondary | ICD-10-CM | POA: Diagnosis not present

## 2019-03-23 DIAGNOSIS — Z7901 Long term (current) use of anticoagulants: Secondary | ICD-10-CM | POA: Diagnosis not present

## 2019-05-04 DIAGNOSIS — Z7901 Long term (current) use of anticoagulants: Secondary | ICD-10-CM | POA: Diagnosis not present

## 2019-05-04 DIAGNOSIS — I480 Paroxysmal atrial fibrillation: Secondary | ICD-10-CM | POA: Diagnosis not present

## 2019-05-12 NOTE — Progress Notes (Deleted)
CARDIOLOGY OFFICE NOTE  Date:  05/12/2019    Bradley Stokes Date of Birth: 11/26/1945 Medical Record #[id number]  PCP:  [name], MD  Cardiologist:  Servando Snare & ***    No chief complaint on file.   History of Present Illness: Bradley Stokes is a 74 y.o. male who presents today for a *** Former patient of Dr. Sherryl Barters. He has elected to follow with me.   He has known PAF, on long term anticoagulationwith Warfarin, on AAD therapy with amiodarone, HTN, gout,morbidobesity, CKD and HLD. His INR is checked by PCP.   I saw himback inNovemberof 2018- felt to be doing ok. But I did stop his beta blocker - ?junctional on EKG and some dizziness noted. Heremainson low dose amiodarone. He had been having a rough time - his sister had been ill and hospitalized - he was trying to take care of her.His ACE has been stopped due to worsening kidney function. Social situation remains very poor. We were able to do a telehealth visit back in May - was doing ok for the most part.   The patient does not have symptoms concerning for COVID-19 infection (fever, chills, cough, or new shortness of breath).   Comes in today. Here alone. He is actually trying to walk in here today - very hard for him to do - using a cane. He says he is doing ok. Just had a new A/C unit put in - had no air for most of the month otherwise. Staying home. He has someone that gets his groceries. No chest pain. Breathing is stable for his. He says his swelling is unchanged. Not dizzy. No falls. He feels like he is doing ok. Has no real concern.  The patient {does/does not:200015} have symptoms concerning for COVID-19 infection (fever, chills, cough, or new shortness of breath).   Comes in today. Here with   Past Medical History:  Diagnosis Date  . Arthritis    severe arthritis and deformity of his knee's  . Chest pain   . Edema   . History of cardiovascular stress test 01/17/2008   EF- 62%  .  Hyperlipidemia   . Hypertension   . Kidney stones   . Long-term (current) use of anticoagulants   . Obesity   . PAF (paroxysmal atrial fibrillation) ([place])     Past Surgical History:  Procedure Laterality Date  . CARDIOLITE STRESS TEST     EF 62%     Medications: No outpatient medications have been marked as taking for the 05/17/19 encounter (Appointment) with Burtis Junes, NP.     Allergies: Allergies  Allergen Reactions  . Other     Tape,needs paper tape    Social History: The patient  reports that he has never smoked. He has never used smokeless tobacco. He reports that he does not drink alcohol or use drugs.   Family History: The patient's ***family history includes Crohn's disease in his sister; Heart disease in his brother; Heart failure in his mother; Kidney failure in his sister; Pneumonia in his mother.   Review of Systems: Please see the history of present illness.   All other systems are reviewed and negative.   Physical Exam: VS:  There were no vitals taken for this visit. Marland Kitchen  BMI There is no height or weight on file to calculate BMI.  Wt Readings from Last 3 Encounters:  12/01/18 292 lb (132.5 kg)  11/24/18 297 lb 12.8 oz (135.1 kg)  08/25/18 293 lb (132.9 kg)    General: Pleasant. Well developed, well nourished and in no acute distress.   HEENT: Normal.  Neck: Supple, no JVD, carotid bruits, or masses noted.  Cardiac: ***Regular rate and rhythm. No murmurs, rubs, or gallops. No edema.  Respiratory:  Lungs are clear to auscultation bilaterally with normal work of breathing.  GI: Soft and nontender.  MS: No deformity or atrophy. Gait and ROM intact.  Skin: Warm and dry. Color is normal.  Neuro:  Strength and sensation are intact and no gross focal deficits noted.  Psych: Alert, appropriate and with normal affect.   LABORATORY DATA:  EKG:  EKG {ACTION; IS/IS LFY:10175102} ordered today. This demonstrates ***.  Lab Results  Component Value Date    WBC 7.7 11/24/2018   HGB 14.1 11/24/2018   HCT 40.3 11/24/2018   PLT 280 11/24/2018   GLUCOSE 98 01/03/2019   CHOL 109 11/24/2018   TRIG 120 11/24/2018   HDL 43 11/24/2018   LDLCALC 42 11/24/2018   ALT 3 11/24/2018   AST 10 11/24/2018   NA 141 01/03/2019   K 4.2 01/03/2019   CL 101 01/03/2019   CREATININE 1.92 (H) 01/03/2019   BUN 25 01/03/2019   CO2 23 01/03/2019   TSH 0.921 11/24/2018   INR 2.11 (H) 10/09/2010     BNP (last 3 results) No results for input(s): BNP in the last 8760 hours.  ProBNP (last 3 results) No results for input(s): PROBNP in the last 8760 hours.   Other Studies Reviewed Today:   Assessment/Plan: Echo Study Conclusions from 12/2014  - Left ventricle: The cavity size was normal. Wall thickness was  normal. Systolic function was normal. The estimated ejection  fraction was in the range of 55% to 60%. - Aortic valve: There was mild regurgitation. - Left atrium: The atrium was moderately dilated.     ASSESSMENT & PLAN:   1. PAF - remains in NSR - on low dose Amiodarone - needs surveillance labs today. I have left him on his current regimen.    2. HTN - BP is fair - will follow.   3. HLD - on statin - labs today  4. CKD - sees Dr. Justin Mend - lab today   5. Chronic anticoagulation - followed by PCP - no problems noted - CBC today  6. Morbid obesity - remains the crux of his issues.    Marland Kitchen COVID-19 Education: The signs and symptoms of COVID-19 were discussed with the patient and how to seek care for testing (follow up with PCP or arrange E-visit).  The importance of social distancing, staying at home, hand hygiene and wearing a mask when out in public were discussed today.  Current medicines are reviewed with the patient today.  The patient does not have concerns regarding medicines other than what has been noted above.  The following changes have been made:  See above.  Labs/ tests ordered today include:   No orders of  the defined types were placed in this encounter.    Disposition:   FU with *** in {gen number 5-85:277824} {Days to years:10300}.   Patient is agreeable to this plan and will call if any problems develop in the interim.   SignedTruitt Merle, NP  05/12/2019 7:29 PM  [facility] [address] [place], [place]  [address] Phone: [phone] Fax: [phone]

## 2019-05-17 ENCOUNTER — Ambulatory Visit: Payer: Medicare HMO | Admitting: Nurse Practitioner

## 2019-05-26 DIAGNOSIS — Z6841 Body Mass Index (BMI) 40.0 and over, adult: Secondary | ICD-10-CM | POA: Diagnosis not present

## 2019-05-26 DIAGNOSIS — E876 Hypokalemia: Secondary | ICD-10-CM | POA: Diagnosis not present

## 2019-05-26 DIAGNOSIS — N183 Chronic kidney disease, stage 3 unspecified: Secondary | ICD-10-CM | POA: Diagnosis not present

## 2019-05-26 DIAGNOSIS — D631 Anemia in chronic kidney disease: Secondary | ICD-10-CM | POA: Diagnosis not present

## 2019-05-26 DIAGNOSIS — E785 Hyperlipidemia, unspecified: Secondary | ICD-10-CM | POA: Diagnosis not present

## 2019-05-26 DIAGNOSIS — I4891 Unspecified atrial fibrillation: Secondary | ICD-10-CM | POA: Diagnosis not present

## 2019-05-26 DIAGNOSIS — I129 Hypertensive chronic kidney disease with stage 1 through stage 4 chronic kidney disease, or unspecified chronic kidney disease: Secondary | ICD-10-CM | POA: Diagnosis not present

## 2019-05-26 DIAGNOSIS — N2581 Secondary hyperparathyroidism of renal origin: Secondary | ICD-10-CM | POA: Diagnosis not present

## 2019-06-03 NOTE — Progress Notes (Signed)
CARDIOLOGY OFFICE NOTE  Date:  06/10/2019    Bradley Stokes Date of Birth: Jan 05, 1946 Medical Record #[id number]  PCP:  [name], MD  Cardiologist:  Servando Snare    Chief Complaint  Patient presents with  . Follow-up    History of Present Illness: Bradley Stokes is a 74 y.o. male who presents today for a follow up visit. Former patient of Dr. Sherryl Barters. He has elected to follow with me.   He has known PAF, on long term anticoagulationwith Warfarin, on AAD therapy with amiodarone, HTN, gout,morbidobesity, CKD and HLD. His INR is checked by PCP.   I saw himback inNovemberof 2018- felt to be doing ok. But I did stop his beta blocker - ?junctional on EKG and some dizziness noted. Heremainson low dose amiodarone. He had been having a rough time - his sister had been ill and hospitalized - he was trying to take care of her.His ACE has been stopped due to worsening kidney function. Social situation remains very poor. We were able to do a telehealth visit back in May - and last saw him in the office in August - had had no A/C at home for about a month.    The patient does not have symptoms concerning for COVID-19 infection (fever, chills, cough, or new shortness of breath).   Comes in today. Here alone. Has not had his medicine yet today - typically takes in the afternoon. No chest pain. Not short of breath. No palpitations. No dizziness. He has basically been home bound for the past year. They have a friend that gets their groceries for him and his sister who lives with him. Tolerating his medicines. Swelling is chronic - will go down some overnight. Very sedentary and weight continues to climb. No problems with his coumadin. He is getting his first COVID vaccine tomorrow at [facility].   Past Medical History:  Diagnosis Date  . Arthritis    severe arthritis and deformity of his knee's  . Chest pain   . Edema   . History of cardiovascular stress test 01/17/2008     EF- 62%  . Hyperlipidemia   . Hypertension   . Kidney stones   . Long-term (current) use of anticoagulants   . Obesity   . PAF (paroxysmal atrial fibrillation) ([place])     Past Surgical History:  Procedure Laterality Date  . CARDIOLITE STRESS TEST     EF 62%     Medications: Current Meds  Medication Sig  . acetaminophen (TYLENOL) 325 MG tablet Take 325 mg by mouth every 6 (six) hours as needed (pain).   Marland Kitchen allopurinol (ZYLOPRIM) 100 MG tablet Take 100 mg by mouth daily.  Marland Kitchen amiodarone (PACERONE) 200 MG tablet TAKE 1/2 TABLET(100 MG) BY MOUTH DAILY  . ammonium lactate (LAC-HYDRIN) 12 % lotion Apply 1 application topically 2 (two) times daily.  Marland Kitchen atorvastatin (LIPITOR) 20 MG tablet Take 20 mg by mouth Daily.  . calcitRIOL (ROCALTROL) 0.5 MCG capsule Take 0.5 mcg daily by mouth.   . furosemide (LASIX) 20 MG tablet TAKE 2 TABLETS BY MOUTH TWICE DAILY  . K Phos Mono-Sod Phos Di & Mono (VIRT-PHOS 250 NEUTRAL) 155-852-130 MG TABS Take 1 tablet by mouth 2 (two) times a day.   . potassium chloride SA (KLOR-CON) 20 MEQ tablet Take 20 mEq by mouth daily.  Marland Kitchen warfarin (COUMADIN) 2 MG tablet Take 4 mg by mouth one time only at 6 PM.      Allergies:  Allergies  Allergen Reactions  . Other     Tape,needs paper tape    Social History: The patient  reports that he has never smoked. He has never used smokeless tobacco. He reports that he does not drink alcohol or use drugs.   Family History: The patient's family history includes Crohn's disease in his sister; Heart disease in his brother; Heart failure in his mother; Kidney failure in his sister; Pneumonia in his mother.   Review of Systems: Please see the history of present illness.   All other systems are reviewed and negative.   Physical Exam: VS:  BP (!) 142/80   Pulse 70   Ht 5\' 9"  (1.753 m)   Wt (!) 312 lb (141.5 kg)   SpO2 95%   BMI 46.07 kg/m  .  BMI Body mass index is 46.07 kg/m.  Wt Readings from Last 3 Encounters:   06/10/19 (!) 312 lb (141.5 kg)  12/01/18 292 lb (132.5 kg)  11/24/18 297 lb 12.8 oz (135.1 kg)    General: Pleasant. Alert. He remains morbidly obese.  Cardiac: Regular rate and rhythm. No murmurs, rubs, or gallops. Chronic massive bilateral edema.  Respiratory:  Lungs are fairly clear to auscultation bilaterally with normal work of breathing.  GI: Soft and nontender.  MS: No deformity or atrophy. Gait not tested - he is in a wheelchair.   Skin: Warm and dry. Color is normal.  Neuro:  Strength and sensation are intact and no gross focal deficits noted.  Psych: Alert, appropriate and with normal affect.   LABORATORY DATA:  EKG:  EKG is ordered today. This demonstrates sinus rhythm with 1st degree AV block. HR is 70.  Lab Results  Component Value Date   WBC 7.7 11/24/2018   HGB 14.1 11/24/2018   HCT 40.3 11/24/2018   PLT 280 11/24/2018   GLUCOSE 98 01/03/2019   CHOL 109 11/24/2018   TRIG 120 11/24/2018   HDL 43 11/24/2018   LDLCALC 42 11/24/2018   ALT 3 11/24/2018   AST 10 11/24/2018   NA 141 01/03/2019   K 4.2 01/03/2019   CL 101 01/03/2019   CREATININE 1.92 (H) 01/03/2019   BUN 25 01/03/2019   CO2 23 01/03/2019   TSH 0.921 11/24/2018   INR 2.11 (H) 10/09/2010    BNP (last 3 results) No results for input(s): BNP in the last 8760 hours.  ProBNP (last 3 results) No results for input(s): PROBNP in the last 8760 hours.   Other Studies Reviewed Today:  Echo Study Conclusions from 12/2014  - Left ventricle: The cavity size was normal. Wall thickness was  normal. Systolic function was normal. The estimated ejection  fraction was in the range of 55% to 60%. - Aortic valve: There was mild regurgitation. - Left atrium: The atrium was moderately dilated.     ASSESSMENT & PLAN:   1. PAF - remains in NSR - EKG ok - does have 1st degree AV block - HR is ok. No worrisome symptoms - no changes made today.   2. HTN - BP is ok.   3. HLD - on statin - lab  today.   4. CKD - has had recent OV with Dr. Justin Mend - says this was stable.   5. Chronic anticoagulation - no problems noted -monitored by his PCP  6. Morbid obesity - crux of his issues. Unfortunately I do not see this changing.   7. COVID-19 Education: The signs and symptoms of COVID-19 were discussed with the  patient and how to seek care for testing (follow up with PCP or arrange E-visit).  The importance of social distancing, staying at home, hand hygiene and wearing a mask when out in public were discussed today. Getting first COVID vaccine tomorrow.   Current medicines are reviewed with the patient today.  The patient does not have concerns regarding medicines other than what has been noted above.  The following changes have been made:  See above.  Labs/ tests ordered today include:    Orders Placed This Encounter  Procedures  . Basic metabolic panel  . CBC  . Hepatic function panel  . Lipid panel  . TSH  . EKG 12-Lead     Disposition:   FU with me in 6 months.  Lab and EKG on return.   Patient is agreeable to this plan and will call if any problems develop in the interim.   SignedTruitt Merle, NP  06/10/2019 10:54 AM  [facility] [address] [place], [place]  [address] Phone: [phone] Fax: [phone]

## 2019-06-10 ENCOUNTER — Ambulatory Visit: Payer: Medicare HMO | Admitting: Nurse Practitioner

## 2019-06-10 ENCOUNTER — Encounter: Payer: Self-pay | Admitting: Nurse Practitioner

## 2019-06-10 ENCOUNTER — Other Ambulatory Visit: Payer: Self-pay

## 2019-06-10 VITALS — BP 142/80 | HR 70 | Ht 69.0 in | Wt 312.0 lb

## 2019-06-10 DIAGNOSIS — I119 Hypertensive heart disease without heart failure: Secondary | ICD-10-CM

## 2019-06-10 DIAGNOSIS — Z7189 Other specified counseling: Secondary | ICD-10-CM | POA: Diagnosis not present

## 2019-06-10 DIAGNOSIS — Z79899 Other long term (current) drug therapy: Secondary | ICD-10-CM | POA: Diagnosis not present

## 2019-06-10 DIAGNOSIS — Z7901 Long term (current) use of anticoagulants: Secondary | ICD-10-CM | POA: Diagnosis not present

## 2019-06-10 DIAGNOSIS — I480 Paroxysmal atrial fibrillation: Secondary | ICD-10-CM

## 2019-06-10 DIAGNOSIS — E7800 Pure hypercholesterolemia, unspecified: Secondary | ICD-10-CM

## 2019-06-10 LAB — HEPATIC FUNCTION PANEL
ALT: 4 IU/L (ref 0–44)
AST: 14 IU/L (ref 0–40)
Albumin: 3.9 g/dL (ref 3.7–4.7)
Alkaline Phosphatase: 156 IU/L — ABNORMAL HIGH (ref 39–117)
Bilirubin Total: 0.7 mg/dL (ref 0.0–1.2)
Bilirubin, Direct: 0.21 mg/dL (ref 0.00–0.40)
Total Protein: 6.4 g/dL (ref 6.0–8.5)

## 2019-06-10 LAB — BASIC METABOLIC PANEL
BUN/Creatinine Ratio: 10 (ref 10–24)
BUN: 20 mg/dL (ref 8–27)
CO2: 25 mmol/L (ref 20–29)
Calcium: 8.9 mg/dL (ref 8.6–10.2)
Chloride: 99 mmol/L (ref 96–106)
Creatinine, Ser: 2.05 mg/dL — ABNORMAL HIGH (ref 0.76–1.27)
GFR calc Af Amer: 36 mL/min/{1.73_m2} — ABNORMAL LOW (ref >59)
GFR calc non Af Amer: 31 mL/min/{1.73_m2} — ABNORMAL LOW (ref >59)
Glucose: 87 mg/dL (ref 65–99)
Potassium: 4.2 mmol/L (ref 3.5–5.2)
Sodium: 138 mmol/L (ref 134–144)

## 2019-06-10 LAB — CBC
Hematocrit: 37.8 % (ref 37.5–51.0)
Hemoglobin: 12.9 g/dL — ABNORMAL LOW (ref 13.0–17.7)
MCH: 32.8 pg (ref 26.6–33.0)
MCHC: 34.1 g/dL (ref 31.5–35.7)
MCV: 96 fL (ref 79–97)
Platelets: 272 10*3/uL (ref 150–450)
RBC: 3.93 x10E6/uL — ABNORMAL LOW (ref 4.14–5.80)
RDW: 12.2 % (ref 11.6–15.4)
WBC: 7.5 10*3/uL (ref 3.4–10.8)

## 2019-06-10 LAB — LIPID PANEL
Chol/HDL Ratio: 2.3 ratio (ref 0.0–5.0)
Cholesterol, Total: 121 mg/dL (ref 100–199)
HDL: 53 mg/dL (ref >39)
LDL Chol Calc (NIH): 46 mg/dL (ref 0–99)
Triglycerides: 123 mg/dL (ref 0–149)
VLDL Cholesterol Cal: 22 mg/dL (ref 5–40)

## 2019-06-10 LAB — TSH: TSH: 1.68 u[IU]/mL (ref 0.450–4.500)

## 2019-06-10 NOTE — Patient Instructions (Addendum)
After Visit Summary:  We will be checking the following labs today - BMET, CBC, HPF, Lipids and TSH   Medication Instructions:    Continue with your current medicines.    If you need a refill on your cardiac medications before your next appointment, please call your pharmacy.     Testing/Procedures To Be Arranged:  N/A  Follow-Up:   See me in 6 months with EKG    At [facility], you and your health needs are our priority.  As part of our continuing mission to provide you with exceptional heart care, we have created designated Provider Care Teams.  These Care Teams include your primary Cardiologist (physician) and Advanced Practice Providers (APPs -  Physician Assistants and Nurse Practitioners) who all work together to provide you with the care you need, when you need it.  Special Instructions:  . Stay safe, stay home, wash your hands for at least 20 seconds and wear a mask when out in public.  . It was good to talk with you today.    Call the [place] office at [phone] if you have any questions, problems or concerns.

## 2019-06-15 DIAGNOSIS — Z7901 Long term (current) use of anticoagulants: Secondary | ICD-10-CM | POA: Diagnosis not present

## 2019-07-13 DIAGNOSIS — I10 Essential (primary) hypertension: Secondary | ICD-10-CM | POA: Diagnosis not present

## 2019-07-13 DIAGNOSIS — E782 Mixed hyperlipidemia: Secondary | ICD-10-CM | POA: Diagnosis not present

## 2019-07-13 DIAGNOSIS — Z125 Encounter for screening for malignant neoplasm of prostate: Secondary | ICD-10-CM | POA: Diagnosis not present

## 2019-07-27 DIAGNOSIS — I10 Essential (primary) hypertension: Secondary | ICD-10-CM | POA: Diagnosis not present

## 2019-07-27 DIAGNOSIS — N183 Chronic kidney disease, stage 3 unspecified: Secondary | ICD-10-CM | POA: Diagnosis not present

## 2019-07-27 DIAGNOSIS — I480 Paroxysmal atrial fibrillation: Secondary | ICD-10-CM | POA: Diagnosis not present

## 2019-07-27 DIAGNOSIS — Z7901 Long term (current) use of anticoagulants: Secondary | ICD-10-CM | POA: Diagnosis not present

## 2019-07-27 DIAGNOSIS — M109 Gout, unspecified: Secondary | ICD-10-CM | POA: Diagnosis not present

## 2019-07-27 DIAGNOSIS — E7800 Pure hypercholesterolemia, unspecified: Secondary | ICD-10-CM | POA: Diagnosis not present

## 2019-09-07 DIAGNOSIS — I480 Paroxysmal atrial fibrillation: Secondary | ICD-10-CM | POA: Diagnosis not present

## 2019-09-07 DIAGNOSIS — Z7901 Long term (current) use of anticoagulants: Secondary | ICD-10-CM | POA: Diagnosis not present

## 2019-10-19 DIAGNOSIS — Z7901 Long term (current) use of anticoagulants: Secondary | ICD-10-CM | POA: Diagnosis not present

## 2019-10-31 ENCOUNTER — Other Ambulatory Visit: Payer: Self-pay | Admitting: Nurse Practitioner

## 2019-11-16 DIAGNOSIS — Z7901 Long term (current) use of anticoagulants: Secondary | ICD-10-CM | POA: Diagnosis not present

## 2019-12-12 NOTE — Progress Notes (Signed)
CARDIOLOGY OFFICE NOTE  Date:  12/21/2019    Bradley Stokes Date of Birth: 05-28-1945 Medical Record #[id number]  PCP:  [name], MD  Cardiologist:  Servando Snare    Chief Complaint  Patient presents with  . Follow-up    History of Present Illness: Bradley Stokes is a 74 y.o. male who presents today for a follow up visit.  Former patient of Dr. Sherryl Barters.   He has known PAF, on long term anticoagulationwith Warfarin, on AAD therapy with amiodarone, HTN, gout,morbidobesity, CKD and HLD. His INR is checked by PCP.   I saw himback inNovemberof 2018- felt to be doing ok. But I did stop his beta blocker - ?junctional on EKG and some dizziness noted at that time. Hehas remained on low dose amiodarone. He had been having a rough time - his sister had been ill and hospitalized - he was trying to take care of her. His ACE has been stopped due to worsening kidney function. Social situation remains very poor.Last seen in March - seemed to be holding his own. Very sedentary. Weight continues to climb.   Comes in today. Here alone.  Considerable difficulty with ambulation - this is chronic. Pretty short of breath with walking in here. He tends to be quite sedentary. He has been vaccinated for COVID 19. No chest pain. His swelling is down per his report. He is not dizzy or lightheaded. No syncope. Weight remains an issue but he is down from our last visit. INR is checked by PCP - last was 2.7. He lives with his sister and they try to help each other out.    Past Medical History:  Diagnosis Date  . Arthritis    severe arthritis and deformity of his knee's  . Chest pain   . Edema   . History of cardiovascular stress test 01/17/2008   EF- 62%  . Hyperlipidemia   . Hypertension   . Kidney stones   . Long-term (current) use of anticoagulants   . Obesity   . PAF (paroxysmal atrial fibrillation) ([place])     Past Surgical History:  Procedure Laterality Date  .  CARDIOLITE STRESS TEST     EF 62%     Medications: Current Meds  Medication Sig  . acetaminophen (TYLENOL) 325 MG tablet Take 325 mg by mouth every 6 (six) hours as needed (pain).   Marland Kitchen allopurinol (ZYLOPRIM) 100 MG tablet Take 100 mg by mouth daily.  Marland Kitchen amiodarone (PACERONE) 200 MG tablet 1/2 tablet daily  . ammonium lactate (LAC-HYDRIN) 12 % lotion Apply 1 application topically 2 (two) times daily.  Marland Kitchen atorvastatin (LIPITOR) 20 MG tablet Take 20 mg by mouth Daily.  . calcitRIOL (ROCALTROL) 0.5 MCG capsule Take 0.5 mcg daily by mouth.   . furosemide (LASIX) 20 MG tablet TAKE 2 TABLETS BY MOUTH TWICE DAILY  . K Phos Mono-Sod Phos Di & Mono (VIRT-PHOS 250 NEUTRAL) 155-852-130 MG TABS Take 1 tablet by mouth 2 (two) times a day.   . potassium chloride SA (KLOR-CON) 20 MEQ tablet TAKE 2 TABLETS BY MOUTH TODAY ONLY(8/27), THEN 1 TABLET DAILY  . warfarin (COUMADIN) 2 MG tablet Take 4 mg by mouth one time only at 6 PM.   . [DISCONTINUED] amiodarone (PACERONE) 200 MG tablet TAKE 1/2 TABLET(100 MG) BY MOUTH DAILY     Allergies: Allergies  Allergen Reactions  . Other     Tape,needs paper tape    Social History: The patient  reports that he  has never smoked. He has never used smokeless tobacco. He reports that he does not drink alcohol and does not use drugs.   Family History: The patient's family history includes Crohn's disease in his sister; Heart disease in his brother; Heart failure in his mother; Kidney failure in his sister; Pneumonia in his mother.   Review of Systems: Please see the history of present illness.   All other systems are reviewed and negative.   Physical Exam: VS:  BP 130/60   Pulse 75   Ht 5\' 9"  (1.753 m)   Wt 298 lb 12.8 oz (135.5 kg)   SpO2 96%   BMI 44.13 kg/m  .  BMI Body mass index is 44.13 kg/m.  Wt Readings from Last 3 Encounters:  12/21/19 298 lb 12.8 oz (135.5 kg)  06/10/19 (!) 312 lb (141.5 kg)  12/01/18 292 lb (132.5 kg)    Bradley: Alert and  in no acute distress. Little disheveled. Remains obese.    Cardiac: Regular rate and rhythm. No murmurs, rubs, or gallops. No edema.  Respiratory:  Lungs are clear to auscultation bilaterally with normal work of breathing.  GI: Soft and nontender.  MS: No deformity or atrophy. Gait and ROM intact.  Skin: Warm and dry. Color is normal.  Neuro:  Strength and sensation are intact and no gross focal deficits noted.  Psych: Alert, appropriate and with normal affect.   LABORATORY DATA:  EKG:  EKG is ordered today. This is personally reviewed by me. This demonstrates sinus with 1st degree AV block. HR is 75.   Lab Results  Component Value Date   WBC 7.5 06/10/2019   HGB 12.9 (L) 06/10/2019   HCT 37.8 06/10/2019   PLT 272 06/10/2019   GLUCOSE 87 06/10/2019   CHOL 121 06/10/2019   TRIG 123 06/10/2019   HDL 53 06/10/2019   LDLCALC 46 06/10/2019   ALT 4 06/10/2019   AST 14 06/10/2019   NA 138 06/10/2019   K 4.2 06/10/2019   CL 99 06/10/2019   CREATININE 2.05 (H) 06/10/2019   BUN 20 06/10/2019   CO2 25 06/10/2019   TSH 1.680 06/10/2019   INR 2.11 (H) 10/09/2010       BNP (last 3 results) No results for input(s): BNP in the last 8760 hours.  ProBNP (last 3 results) No results for input(s): PROBNP in the last 8760 hours.   Other Studies Reviewed Today:  Echo Study Conclusions from 12/2014  - Left ventricle: The cavity size was normal. Wall thickness was  normal. Systolic function was normal. The estimated ejection  fraction was in the range of 55% to 60%. - Aortic valve: There was mild regurgitation. - Left atrium: The atrium was moderately dilated.     ASSESSMENT & PLAN:   1. PAF - remains in NSR - no longer on beta blocker. Only on low dose amiodarone. Needs surveillance lab today. Would continue with current regimen.   2. Chronic 1st degree AV block. Would follow.   3. HLD - on statin - lab today.   4. CKD - lab today.   5. HTN - BP is ok here today  - no changes made.   6. Chronic anticoagulation - monitored by PCP - lab today.   7. Morbid obesity - remains crux of his issues - unfortunately, I do not see this changing but he has made a little progress over the past 6 months.   Current medicines are reviewed with the patient today.  The patient  does not have concerns regarding medicines other than what has been noted above.  The following changes have been made:  See above.  Labs/ tests ordered today include:    Orders Placed This Encounter  Procedures  . Basic metabolic panel  . CBC  . Hepatic function panel  . Lipid panel  . TSH  . EKG 12-Lead     Disposition:   FU with Dr. Marlou Porch as new patient in 6 months.    Patient is agreeable to this plan and will call if any problems develop in the interim.   SignedTruitt Merle, NP  12/21/2019 11:45 AM  [facility] [address] [place], [place]  [address] Phone: [phone] Fax: [phone]

## 2019-12-14 DIAGNOSIS — Z7901 Long term (current) use of anticoagulants: Secondary | ICD-10-CM | POA: Diagnosis not present

## 2019-12-21 ENCOUNTER — Encounter: Payer: Self-pay | Admitting: Nurse Practitioner

## 2019-12-21 ENCOUNTER — Ambulatory Visit: Payer: Medicare HMO | Admitting: Nurse Practitioner

## 2019-12-21 ENCOUNTER — Other Ambulatory Visit: Payer: Self-pay

## 2019-12-21 VITALS — BP 130/60 | HR 75 | Ht 69.0 in | Wt 298.8 lb

## 2019-12-21 DIAGNOSIS — Z7901 Long term (current) use of anticoagulants: Secondary | ICD-10-CM | POA: Diagnosis not present

## 2019-12-21 DIAGNOSIS — E7800 Pure hypercholesterolemia, unspecified: Secondary | ICD-10-CM

## 2019-12-21 DIAGNOSIS — Z79899 Other long term (current) drug therapy: Secondary | ICD-10-CM

## 2019-12-21 DIAGNOSIS — I119 Hypertensive heart disease without heart failure: Secondary | ICD-10-CM

## 2019-12-21 DIAGNOSIS — I480 Paroxysmal atrial fibrillation: Secondary | ICD-10-CM

## 2019-12-21 DIAGNOSIS — N189 Chronic kidney disease, unspecified: Secondary | ICD-10-CM

## 2019-12-21 LAB — CBC
Hematocrit: 40.4 % (ref 37.5–51.0)
Hemoglobin: 13.7 g/dL (ref 13.0–17.7)
MCH: 33.3 pg — ABNORMAL HIGH (ref 26.6–33.0)
MCHC: 33.9 g/dL (ref 31.5–35.7)
MCV: 98 fL — ABNORMAL HIGH (ref 79–97)
Platelets: 252 10*3/uL (ref 150–450)
RBC: 4.12 x10E6/uL — ABNORMAL LOW (ref 4.14–5.80)
RDW: 12.3 % (ref 11.6–15.4)
WBC: 7.2 10*3/uL (ref 3.4–10.8)

## 2019-12-21 LAB — BASIC METABOLIC PANEL
BUN/Creatinine Ratio: 11 (ref 10–24)
BUN: 21 mg/dL (ref 8–27)
CO2: 25 mmol/L (ref 20–29)
Calcium: 9 mg/dL (ref 8.6–10.2)
Chloride: 102 mmol/L (ref 96–106)
Creatinine, Ser: 1.95 mg/dL — ABNORMAL HIGH (ref 0.76–1.27)
GFR calc Af Amer: 38 mL/min/{1.73_m2} — ABNORMAL LOW (ref >59)
GFR calc non Af Amer: 32 mL/min/{1.73_m2} — ABNORMAL LOW (ref >59)
Glucose: 100 mg/dL — ABNORMAL HIGH (ref 65–99)
Potassium: 3.9 mmol/L (ref 3.5–5.2)
Sodium: 141 mmol/L (ref 134–144)

## 2019-12-21 LAB — TSH: TSH: 1.34 u[IU]/mL (ref 0.450–4.500)

## 2019-12-21 LAB — HEPATIC FUNCTION PANEL
ALT: 6 IU/L (ref 0–44)
AST: 10 IU/L (ref 0–40)
Albumin: 4.1 g/dL (ref 3.7–4.7)
Alkaline Phosphatase: 153 IU/L — ABNORMAL HIGH (ref 44–121)
Bilirubin Total: 1 mg/dL (ref 0.0–1.2)
Bilirubin, Direct: 0.26 mg/dL (ref 0.00–0.40)
Total Protein: 6.9 g/dL (ref 6.0–8.5)

## 2019-12-21 LAB — LIPID PANEL
Chol/HDL Ratio: 2.7 ratio (ref 0.0–5.0)
Cholesterol, Total: 131 mg/dL (ref 100–199)
HDL: 48 mg/dL (ref >39)
LDL Chol Calc (NIH): 62 mg/dL (ref 0–99)
Triglycerides: 120 mg/dL (ref 0–149)
VLDL Cholesterol Cal: 21 mg/dL (ref 5–40)

## 2019-12-21 MED ORDER — AMIODARONE HCL 200 MG PO TABS: ORAL_TABLET | ORAL | 3 refills | Status: DC

## 2019-12-21 NOTE — Patient Instructions (Addendum)
After Visit Summary:  We will be checking the following labs today - BMET, CBC, HPF, Lipids and TSH   Medication Instructions:    Continue with your current medicines.    If you need a refill on your cardiac medications before your next appointment, please call your pharmacy.     Testing/Procedures To Be Arranged:  N/A  Follow-Up:   See Dr. Marlou Porch in 6 months - new patient visit.     At [facility], you and your health needs are our priority.  As part of our continuing mission to provide you with exceptional heart care, we have created designated Provider Care Teams.  These Care Teams include your primary Cardiologist (physician) and Advanced Practice Providers (APPs -  Physician Assistants and Nurse Practitioners) who all work together to provide you with the care you need, when you need it.  Special Instructions:  . Stay safe, wash your hands for at least 20 seconds and wear a mask when needed.  . It was good to talk with you today.    Call the [place] office at [phone] if you have any questions, problems or concerns.

## 2019-12-28 DIAGNOSIS — E785 Hyperlipidemia, unspecified: Secondary | ICD-10-CM | POA: Diagnosis not present

## 2019-12-28 DIAGNOSIS — E876 Hypokalemia: Secondary | ICD-10-CM | POA: Diagnosis not present

## 2019-12-28 DIAGNOSIS — Z6841 Body Mass Index (BMI) 40.0 and over, adult: Secondary | ICD-10-CM | POA: Diagnosis not present

## 2019-12-28 DIAGNOSIS — I129 Hypertensive chronic kidney disease with stage 1 through stage 4 chronic kidney disease, or unspecified chronic kidney disease: Secondary | ICD-10-CM | POA: Diagnosis not present

## 2019-12-28 DIAGNOSIS — N183 Chronic kidney disease, stage 3 unspecified: Secondary | ICD-10-CM | POA: Diagnosis not present

## 2019-12-28 DIAGNOSIS — N2581 Secondary hyperparathyroidism of renal origin: Secondary | ICD-10-CM | POA: Diagnosis not present

## 2019-12-28 DIAGNOSIS — I4891 Unspecified atrial fibrillation: Secondary | ICD-10-CM | POA: Diagnosis not present

## 2019-12-28 DIAGNOSIS — D631 Anemia in chronic kidney disease: Secondary | ICD-10-CM | POA: Diagnosis not present

## 2020-01-03 DIAGNOSIS — Z23 Encounter for immunization: Secondary | ICD-10-CM | POA: Diagnosis not present

## 2020-01-17 DIAGNOSIS — Z7901 Long term (current) use of anticoagulants: Secondary | ICD-10-CM | POA: Diagnosis not present

## 2020-01-17 DIAGNOSIS — N183 Chronic kidney disease, stage 3 unspecified: Secondary | ICD-10-CM | POA: Diagnosis not present

## 2020-01-17 DIAGNOSIS — I10 Essential (primary) hypertension: Secondary | ICD-10-CM | POA: Diagnosis not present

## 2020-01-17 DIAGNOSIS — I480 Paroxysmal atrial fibrillation: Secondary | ICD-10-CM | POA: Diagnosis not present

## 2020-01-17 DIAGNOSIS — E7800 Pure hypercholesterolemia, unspecified: Secondary | ICD-10-CM | POA: Diagnosis not present

## 2020-01-17 DIAGNOSIS — M109 Gout, unspecified: Secondary | ICD-10-CM | POA: Diagnosis not present

## 2020-02-09 ENCOUNTER — Telehealth: Payer: Self-pay | Admitting: Physician Assistant

## 2020-02-09 NOTE — Telephone Encounter (Signed)
I connected by phone with Bradley Stokes and/or patient's caregiver on 02/09/2020 at 1:52 PM to discuss the potential vaccination through our Homebound vaccination initiative.   Prevaccination Checklist for COVID-19 Vaccines  1.  Are you feeling sick today? no  2.  Have you ever received a dose of a COVID-19 vaccine?  yes      If yes, which one? Pfizer   3.  Have you ever had an allergic reaction: (This would include a severe reaction [ e.g., anaphylaxis] that required treatment with epinephrine or EpiPen or that caused you to go to the hospital.  It would also include an allergic reaction that occurred within 4 hours that caused hives, swelling, or respiratory distress, including wheezing.) A.  A previous dose of COVID-19 vaccine. no  B.  A vaccine or injectable therapy that contains multiple components, one of which is a COVID-19 vaccine component, but it is not known which component elicited the immediate reaction. no  C.  Are you allergic to polyethylene glycol? no  D. Are you allergic to Polysorbate, which is found in some vaccines, film coated tablets and intravenous steroids?  no   4.  Have you ever had an allergic reaction to another vaccine (other than COVID-19 vaccine) or an injectable medication? (This would include a severe reaction [ e.g., anaphylaxis] that required treatment with epinephrine or EpiPen or that caused you to go to the hospital.  It would also include an allergic reaction that occurred within 4 hours that caused hives, swelling, or respiratory distress, including wheezing.)  no   5.  Have you ever had a severe allergic reaction (e.g., anaphylaxis) to something other than a component of the COVID-19 vaccine, or any vaccine or injectable medication?  This would include food, pet, venom, environmental, or oral medication allergies.  no   6.  Have you received any vaccine in the last 14 days? no   7.  Have you ever had a positive test for COVID-19 or has a doctor ever  told you that you had COVID-19?  no   8.  Have you received passive antibody therapy (monoclonal antibodies or convalescent serum) as a treatment for COVID-19? no   9.  Do you have a weakened immune system caused by something such as HIV infection or cancer or do you take immunosuppressive drugs or therapies?  no   10.  Do you have a bleeding disorder or are you taking a blood thinner? no   11.  Are you pregnant or breast-feeding? no   12.  Do you have dermal fillers? no   __________________   This patient is a 74 y.o. male that meets the FDA criteria to receive homebound vaccination. Patient or parent/caregiver understands they have the option to accept or refuse homebound vaccination.  Patient passed the pre-screening checklist and would like to proceed with homebound vaccination.  Based on questionnaire above, I recommend the patient be observed for 30 minutes.  There are an estimated 1 other household members/caregivers who are also interested in receiving the vaccine. (sister who lives with him)   I will send the patient's information to our scheduling team who will reach out to schedule the patient and potential caregiver/family members for homebound vaccination.    Angelena Form 02/09/2020 1:52 PM

## 2020-02-15 DIAGNOSIS — Z7901 Long term (current) use of anticoagulants: Secondary | ICD-10-CM | POA: Diagnosis not present

## 2020-02-15 DIAGNOSIS — I8310 Varicose veins of unspecified lower extremity with inflammation: Secondary | ICD-10-CM | POA: Diagnosis not present

## 2020-02-15 DIAGNOSIS — I480 Paroxysmal atrial fibrillation: Secondary | ICD-10-CM | POA: Diagnosis not present

## 2020-02-21 ENCOUNTER — Ambulatory Visit: Payer: Medicare HMO

## 2020-02-21 NOTE — Progress Notes (Signed)
   Covid-19 Vaccination Clinic  Name:  Bradley Stokes    MRN: [id number] DOB: 09-02-1945  02/21/2020  Mr. Bee was observed post Covid-19 immunization for 15 minutes without incident. He was provided with Vaccine Information Sheet and instruction to access the V-Safe system.   [name] was instructed to call 911 with any severe reactions post vaccine: Marland Kitchen Difficulty breathing  . Swelling of face and throat  . A fast heartbeat  . A bad rash all over body  . Dizziness and weakness

## 2020-03-14 DIAGNOSIS — I482 Chronic atrial fibrillation, unspecified: Secondary | ICD-10-CM | POA: Diagnosis not present

## 2020-03-14 DIAGNOSIS — I8310 Varicose veins of unspecified lower extremity with inflammation: Secondary | ICD-10-CM | POA: Diagnosis not present

## 2020-03-14 DIAGNOSIS — Z7901 Long term (current) use of anticoagulants: Secondary | ICD-10-CM | POA: Diagnosis not present

## 2020-04-24 ENCOUNTER — Encounter (HOSPITAL_BASED_OUTPATIENT_CLINIC_OR_DEPARTMENT_OTHER): Payer: Medicare HMO | Attending: Internal Medicine | Admitting: Internal Medicine

## 2020-04-24 ENCOUNTER — Other Ambulatory Visit: Payer: Self-pay

## 2020-04-24 DIAGNOSIS — E669 Obesity, unspecified: Secondary | ICD-10-CM | POA: Diagnosis not present

## 2020-04-24 DIAGNOSIS — E785 Hyperlipidemia, unspecified: Secondary | ICD-10-CM | POA: Diagnosis not present

## 2020-04-24 DIAGNOSIS — Z85828 Personal history of other malignant neoplasm of skin: Secondary | ICD-10-CM | POA: Diagnosis not present

## 2020-04-24 DIAGNOSIS — L97828 Non-pressure chronic ulcer of other part of left lower leg with other specified severity: Secondary | ICD-10-CM | POA: Insufficient documentation

## 2020-04-24 DIAGNOSIS — Z6841 Body Mass Index (BMI) 40.0 and over, adult: Secondary | ICD-10-CM | POA: Diagnosis not present

## 2020-04-24 DIAGNOSIS — Z8249 Family history of ischemic heart disease and other diseases of the circulatory system: Secondary | ICD-10-CM | POA: Insufficient documentation

## 2020-04-24 DIAGNOSIS — N183 Chronic kidney disease, stage 3 unspecified: Secondary | ICD-10-CM | POA: Diagnosis not present

## 2020-04-24 DIAGNOSIS — I890 Lymphedema, not elsewhere classified: Secondary | ICD-10-CM | POA: Diagnosis not present

## 2020-04-24 DIAGNOSIS — I480 Paroxysmal atrial fibrillation: Secondary | ICD-10-CM | POA: Insufficient documentation

## 2020-04-24 DIAGNOSIS — I129 Hypertensive chronic kidney disease with stage 1 through stage 4 chronic kidney disease, or unspecified chronic kidney disease: Secondary | ICD-10-CM | POA: Diagnosis not present

## 2020-04-24 NOTE — Progress Notes (Signed)
DAVIT, VASSAR ([id number]) Visit Report for 04/24/2020 Abuse/Suicide Risk Screen Details Patient Name: Date of Service: Bradley Stokes, Bradley Stokes 04/24/2020 10:30 A M Medical Record Number: [id number] Patient Account Number: [id number] Date of Birth/Sex: Treating RN: [birth year] (75 y.o. [name] Primary Care Kaid Seeberger: [name] Other Clinician: Referring Matricia Begnaud: Treating Marielle Mantione/Extender: [name] in Treatment: 0 Abuse/Suicide Risk Screen Items Answer ABUSE RISK SCREEN: Has anyone close to you tried to hurt or harm you recentlyo No Do you feel uncomfortable with anyone in your familyo No Has anyone forced you do things that you didnt want to doo No Electronic Signature(s) Signed: 04/24/2020 5:42:27 PM By: [name] RN, BSN Entered By: [name] on 04/24/2020 11:10:59 -------------------------------------------------------------------------------- Activities of Daily Living Details Patient Name: Date of Service: Bradley Stokes, Bradley Stokes 04/24/2020 10:30 A M Medical Record Number: [id number] Patient Account Number: [id number] Date of Birth/Sex: Treating RN: Oct 17, 1945 (75 y.o. [name] Primary Care Dalinda Heidt: [name] Other Clinician: Referring Deborha Moseley: Treating Katilynn Sinkler/Extender: [name] in Treatment: 0 Activities of Daily Living Items Answer Activities of Daily Living (Please select one for each item) Drive Automobile Completely Able T Medications ake Completely Able Use T elephone Completely Able Care for Appearance Completely Able Use T oilet Completely Able Bath / Shower Completely Able Dress Self Completely Able Feed Self Completely Able Walk Need Assistance Get In / Out Bed Completely Able Housework Completely Able Prepare Meals Completely [place] for Self Completely Able Electronic Signature(s) Signed: 04/24/2020 5:42:27 PM By:  [name] RN, BSN Entered By: [name] on 04/24/2020 11:11:23 -------------------------------------------------------------------------------- Education Screening Details Patient Name: Date of Service: Bradley Stokes 04/24/2020 10:30 A M Medical Record Number: [id number] Patient Account Number: [id number] Date of Birth/Sex: Treating RN: 28-Sep-1945 (75 y.o. [name] Primary Care Bhumi Godbey: [name] Other Clinician: Referring Alante Weimann: Treating Amera Banos/Extender: [name] in Treatment: 0 Primary Learner Assessed: Patient Learning Preferences/Education Level/Primary Language Learning Preference: Explanation, Demonstration, Printed Material Highest Education Level: High School Preferred Language: English Cognitive Barrier Language Barrier: No Translator Needed: No Memory Deficit: No Emotional Barrier: No Cultural/Religious Beliefs Affecting Medical Care: No Physical Barrier Impaired Vision: No Impaired Hearing: No Decreased Hand dexterity: No Knowledge/Comprehension Knowledge Level: High Comprehension Level: High Ability to understand written instructions: High Ability to understand verbal instructions: High Motivation Anxiety Level: Calm Cooperation: Cooperative Education Importance: Acknowledges Need Interest in Health Problems: Asks Questions Perception: Coherent Willingness to Engage in Self-Management High Activities: Readiness to Engage in Self-Management High Activities: Electronic Signature(s) Signed: 04/24/2020 5:42:27 PM By: [name] RN, BSN Entered By: [name] on 04/24/2020 11:11:46 -------------------------------------------------------------------------------- Fall Risk Assessment Details Patient Name: Date of Service: Bradley Stokes. 04/24/2020 10:30 A M Medical Record Number: [id number] Patient Account Number: [id number] Date of Birth/Sex: Treating RN: 10-Jan-1946 (75  y.o. [name] Primary Care Amen Dargis: [name] Other Clinician: Referring Jazalynn Mireles: Treating Rylen Swindler/Extender: [name] in Treatment: 0 Fall Risk Assessment Items Have you had 2 or more falls in the last 12 monthso 0 No Have you had any fall that resulted in injury in the last 12 monthso 0 No FALLS RISK SCREEN History of falling - immediate or within 3 months 0 No Secondary diagnosis (Do you have 2 or more medical diagnoseso) 0 No Ambulatory aid None/bed rest/wheelchair/nurse 0 No Crutches/cane/walker 15 Yes Furniture 0 No Intravenous therapy Access/Saline/Heparin Lock 0 No Gait/Transferring Normal/ bed  rest/ wheelchair 0 No Weak (short steps with or without shuffle, stooped but able to lift head while walking, may seek 10 Yes support from furniture) Impaired (short steps with shuffle, may have difficulty arising from chair, head down, impaired 0 No balance) Mental Status Oriented to own ability 0 Yes Electronic Signature(s) Signed: 04/24/2020 5:42:27 PM By: [name] RN, BSN Entered By: [name] on 04/24/2020 11:12:01 -------------------------------------------------------------------------------- Foot Assessment Details Patient Name: Date of Service: Bradley Stokes 04/24/2020 10:30 A M Medical Record Number: [id number] Patient Account Number: [id number] Date of Birth/Sex: Treating RN: 1946-01-21 (75 y.o. [name] Primary Care Cloyce Blankenhorn: Milagros Evener R Other Clinician: Referring Priyana Mccarey: Treating Sally-Anne Wamble/Extender: [name] in Treatment: 0 Foot Assessment Items Site Locations + = Sensation present, - = Sensation absent, C = Callus, U = Ulcer R = Redness, W = Warmth, M = Maceration, PU = Pre-ulcerative lesion F = Fissure, S = Swelling, D = Dryness Assessment Right: Left: Other Deformity: No No Prior Foot Ulcer: No No Prior Amputation: No No Charcot  Joint: No No Ambulatory Status: Ambulatory With Help Assistance Device: Cane Gait: Steady Electronic Signature(s) Signed: 04/24/2020 5:42:27 PM By: [name] RN, BSN Entered By: [name] on 04/24/2020 11:12:42 -------------------------------------------------------------------------------- Nutrition Risk Screening Details Patient Name: Date of Service: Bradley Stokes, Bradley Stokes 04/24/2020 10:30 A M Medical Record Number: [id number] Patient Account Number: [id number] Date of Birth/Sex: Treating RN: [birth year] (75 y.o. [name] Primary Care Savannaha Stonerock: [name] Other Clinician: Referring Amey Hossain: Treating Castulo Scarpelli/Extender: [name] in Treatment: 0 Height (in): 69 Weight (lbs): 275 Body Mass Index (BMI): 40.6 Nutrition Risk Screening Items Score Screening NUTRITION RISK SCREEN: I have an illness or condition that made me change the kind and/or amount of food I eat 0 No I eat fewer than two meals per day 0 No I eat few fruits and vegetables, or milk products 0 No I have three or more drinks of beer, liquor or wine almost every day 0 No I have tooth or mouth problems that make it hard for me to eat 2 Yes I don't always have enough money to buy the food I need 0 No I eat alone most of the time 0 No I take three or more different prescribed or over-the-counter drugs a day 1 Yes Without wanting to, I have lost or gained 10 pounds in the last six months 0 No I am not always physically able to shop, cook and/or feed myself 0 No Nutrition Protocols Good Risk Protocol Moderate Risk Protocol 0 Provide education on nutrition High Risk Proctocol Risk Level: Moderate Risk Score: 3 Electronic Signature(s) Signed: 04/24/2020 5:42:27 PM By: [name] RN, BSN Entered By: [name] on 04/24/2020 11:12:30

## 2020-04-25 NOTE — Progress Notes (Signed)
Bradley Stokes, Bradley Stokes ([id number]) Visit Report for 04/24/2020 Allergy List Details Patient Name: Date of Service: Bradley, Stokes 04/24/2020 10:30 A M Medical Record Number: [id number] Patient Account Number: [id number] Date of Birth/Sex: Treating RN: 01-02-1946 (75 y.o. [name] Primary Care Keyandra Swenson: [name] Other Clinician: Referring Nial Hawe: Treating Dariush Mcnellis/Extender: [name] in Treatment: 0 Allergies Active Allergies adhesive Reaction: rash Type: Food Allergy Notes Electronic Signature(s) Signed: 04/24/2020 5:42:27 PM By: [name] RN, BSN Entered By: [name] on 04/24/2020 11:01:42 -------------------------------------------------------------------------------- Arrival Information Details Patient Name: Date of Service: Bradley Stokes 04/24/2020 10:30 A M Medical Record Number: [id number] Patient Account Number: [id number] Date of Birth/Sex: Treating RN: 11-09-1945 (75 y.o. [name] Primary Care Lucillie Kiesel: [name] Other Clinician: Referring Lavida Patch: Treating Jayen Bromwell/Extender: [name] in Treatment: 0 Visit Information Patient Arrived: [name] Arrival Time: 10:47 Accompanied By: self Transfer Assistance: None Patient Identification Verified: Yes Secondary Verification Process Completed: Yes History Since Last Visit Electronic Signature(s) Signed: 04/24/2020 5:42:27 PM By: [name] RN, BSN Entered By: [name] on 04/24/2020 10:59:27 -------------------------------------------------------------------------------- Clinic Level of Care Assessment Details Patient Name: Date of Service: Stokes, Bradley 04/24/2020 10:30 A M Medical Record Number: [id number] Patient Account Number: [id number] Date of Birth/Sex: Treating RN: September 12, 1945 (75 y.o. [name], [name] Primary Care Trelyn Vanderlinde: [name] Other Clinician: Referring  Shalen Petrak: Treating Davanee Klinkner/Extender: [name] in Treatment: 0 Clinic Level of Care Assessment Items TOOL 3 Quantity Score X- 1 0 Use when EandM and Procedure is performed on FOLLOW-UP visit ASSESSMENTS - Nursing Assessment / Reassessment X- 1 10 Reassessment of Co-morbidities (includes updates in patient status) X- 1 5 Reassessment of Adherence to Treatment Plan ASSESSMENTS - Wound and Skin Assessment / Reassessment []  - Points for Wound Assessment can only be taken for a new wound of unknown or different etiology and a procedure is 0 NOT performed to that wound X- 1 5 Simple Wound Assessment / Reassessment - one wound []  - 0 Complex Wound Assessment / Reassessment - multiple wounds X- 1 10 Dermatologic / Skin Assessment (not related to wound area) ASSESSMENTS - Focused Assessment X- 1 5 Circumferential Edema Measurements - multi extremities X- 1 10 Nutritional Assessment / Counseling / Intervention X- 1 5 Lower Extremity Assessment (monofilament, tuning fork, pulses) []  - 0 Peripheral Arterial Disease Assessment (using hand held doppler) ASSESSMENTS - Ostomy and/or Continence Assessment and Care []  - 0 Incontinence Assessment and Management []  - 0 Ostomy Care Assessment and Management (repouching, etc.) PROCESS - Coordination of Care []  - Points for Discharge Coordination can only be taken for a new wound of unknown or different etiology and a 0 procedure is NOT performed to that wound []  - 0 Simple Patient / Family Education for ongoing care X- 1 20 Complex (extensive) Patient / Family Education for ongoing care []  - 0 Staff obtains Programmer, systems, Records, T Results / Process Orders est []  - 0 Staff telephones HHA, Nursing Homes / Clarify orders / etc []  - 0 Routine Transfer to another Facility (non-emergent condition) []  - 0 Routine Hospital Admission (non-emergent condition) []  - 0 New Admissions / Biomedical engineer /  Ordering NPWT Apligraf, etc. , []  - 0 Emergency Hospital Admission (emergent condition) X- 1 10 Simple Discharge Coordination []  - 0 Complex (extensive) Discharge Coordination PROCESS - Special Needs []  - 0 Pediatric / Minor Patient Management []  - 0 Isolation Patient Management []  - 0 Hearing /  Language / Visual special needs []  - 0 Assessment of Community assistance (transportation, D/C planning, etc.) []  - 0 Additional assistance / Altered mentation []  - 0 Support Surface(s) Assessment (bed, cushion, seat, etc.) INTERVENTIONS - Wound Cleansing / Measurement []  - Points for Wound Cleaning / Measurement, Wound Dressing, Specimen Collection and Specimen taken to lab can 0 only be taken for a new wound of unknown or different etiology and a procedure is NOT performed to that wound X- 1 5 Simple Wound Cleansing - one wound []  - 0 Complex Wound Cleansing - multiple wounds X- 1 5 Wound Imaging (photographs - any number of wounds) []  - 0 Wound Tracing (instead of photographs) X- 1 5 Simple Wound Measurement - one wound []  - 0 Complex Wound Measurement - multiple wounds INTERVENTIONS - Wound Dressings []  - 0 Small Wound Dressing one or multiple wounds X- 1 15 Medium Wound Dressing one or multiple wounds []  - 0 Large Wound Dressing one or multiple wounds INTERVENTIONS - Miscellaneous []  - 0 External ear exam []  - 0 Specimen Collection (cultures, biopsies, blood, body fluids, etc.) []  - 0 Specimen(s) / Culture(s) sent or taken to Lab for analysis []  - 0 Patient Transfer (multiple staff / Civil Service fast streamer / Similar devices) []  - 0 Simple Staple / Suture removal (25 or less) []  - 0 Complex Staple / Suture removal (26 or more) []  - 0 Hypo / Hyperglycemic Management (close monitor of Blood Glucose) []  - 0 Ankle / Brachial Index (ABI) - do not check if billed separately X- 1 5 Vital Signs Has the patient been seen at the hospital within the last three years: Yes Total  Score: 115 Level Of Care: New/Established - Level 3 Electronic Signature(s) Signed: 04/25/2020 5:56:51 PM By: [name] RN Entered By: [name] on 04/24/2020 12:59:59 -------------------------------------------------------------------------------- Encounter Discharge Information Details Patient Name: Date of Service: Bradley Stokes. 04/24/2020 10:30 A M Medical Record Number: [id number] Patient Account Number: [id number] Date of Birth/Sex: Treating RN: 10-31-1945 (75 y.o. [name] Primary Care Brown Dunlap: [name] Other Clinician: Referring Johanan Skorupski: Treating Darcey Demma/Extender: [name] in Treatment: 0 Encounter Discharge Information Items Discharge Condition: Stable Ambulatory Status: Cane Discharge Destination: Home Transportation: Private Auto Accompanied By: self Schedule Follow-up Appointment: Yes Clinical Summary of Care: Electronic Signature(s) Signed: 04/24/2020 6:32:06 PM By: Deon Pilling Entered By: Deon Pilling on 04/24/2020 17:44:54 -------------------------------------------------------------------------------- Lower Extremity Assessment Details Patient Name: Date of Service: JAJUAN, SKOOG 04/24/2020 10:30 A M Medical Record Number: [id number] Patient Account Number: [id number] Date of Birth/Sex: Treating RN: [birth year] (75 y.o. [name] Primary Care Akiyah Eppolito: [name] Other Clinician: Referring Vernessa Likes: Treating Juel Ripley/Extender: [name] in Treatment: 0 Edema Assessment Assessed: [Left: No] [Right: No] E[Left: dema] [Right: :] Calf Left: Right: Point of Measurement: From Medial Instep 51.6 cm 54 cm Ankle Left: Right: Point of Measurement: From Medial Instep 31.5 cm 28.6 cm Vascular Assessment Pulses: Dorsalis Pedis Palpable: [Left:Yes] [Right:Yes] Electronic Signature(s) Signed: 04/24/2020 5:42:27 PM By: [name] RN,  BSN Entered By: [name] on 04/24/2020 11:14:53 -------------------------------------------------------------------------------- Multi-Disciplinary Care Plan Details Patient Name: Date of Service: Bradley Stokes. 04/24/2020 10:30 A M Medical Record Number: [id number] Patient Account Number: [id number] Date of Birth/Sex: Treating RN: [birth year] (75 y.o. [name] Primary Care Yao Hyppolite: [name] Other Clinician: Referring Xia Stohr: Treating Dorsie Sethi/Extender: [name] in Treatment: 0 Active Inactive Orientation to the Wound Care Program Nursing Diagnoses: Knowledge  deficit related to the wound healing center program Goals: Patient/caregiver will verbalize understanding of the [place] Date Initiated: 04/24/2020 Target Resolution Date: 05/03/2020 Goal Status: Active Interventions: Provide education on orientation to the wound center Notes: Wound/Skin Impairment Nursing Diagnoses: Knowledge deficit related to ulceration/compromised skin integrity Goals: Patient will have a decrease in wound volume by X% from date: (specify in notes) Date Initiated: 04/24/2020 Target Resolution Date: 05/03/2020 Goal Status: Active Patient/caregiver will verbalize understanding of skin care regimen Date Initiated: 04/24/2020 Target Resolution Date: 05/04/2020 Goal Status: Active Ulcer/skin breakdown will have a volume reduction of 30% by week 4 Date Initiated: 04/24/2020 Target Resolution Date: 05/05/2020 Goal Status: Active Interventions: Assess patient/caregiver ability to obtain necessary supplies Assess patient/caregiver ability to perform ulcer/skin care regimen upon admission and as needed Assess ulceration(s) every visit Notes: Electronic Signature(s) Signed: 04/25/2020 5:56:51 PM By: [name] RN Entered By: [name] on 04/24/2020  11:37:14 -------------------------------------------------------------------------------- Pain Assessment Details Patient Name: Date of Service: Bradley Stokes 04/24/2020 10:30 A M Medical Record Number: [id number] Patient Account Number: [id number] Date of Birth/Sex: Treating RN: 11-27-1945 (75 y.o. [name] Primary Care Nikolaos Maddocks: [name] Other Clinician: Referring Gradie Butrick: Treating Dunbar Buras/Extender: [name] in Treatment: 0 Active Problems Location of Pain Severity and Description of Pain Patient Has Paino No Site Locations Rate the pain. Current Pain Level: 0 Pain Management and Medication Current Pain Management: Electronic Signature(s) Signed: 04/24/2020 5:42:27 PM By: [name] RN, BSN Entered By: [name] on 04/24/2020 11:16:00 -------------------------------------------------------------------------------- Patient/Caregiver Education Details Patient Name: Date of Service: Bradley Stokes 1/25/2022andnbsp10:30 [place] Record Number: [id number] Patient Account Number: [id number] Date of Birth/Gender: Treating RN: 03-13-1946 (75 y.o. [name] Primary Care Physician: [name] Other Clinician: Referring Physician: Treating Physician/Extender: [name] in Treatment: 0 Education Assessment Education Provided To: Patient Education Topics Provided [place]: o Methods: Explain/Verbal Responses: State content correctly Electronic Signature(s) Signed: 04/25/2020 5:56:51 PM By: [name] RN Entered By: [name] on 04/24/2020 11:37:23 -------------------------------------------------------------------------------- Vitals Details Patient Name: Date of Service: Bradley Stokes. 04/24/2020 10:30 A M Medical Record Number: [id number] Patient Account Number: [id number] Date of Birth/Sex: Treating  RN: 03-17-1946 (75 y.o. [name] Primary Care Kaitlyn Skowron: [name] Other Clinician: Referring Jaquala Fuller: Treating Coyle Stordahl/Extender: [name] in Treatment: 0 Vital Signs Time Taken: 11:00 Temperature (F): 97.5 Height (in): 69 Pulse (bpm): 73 Source: Stated Respiratory Rate (breaths/min): 18 Weight (lbs): 275 Blood Pressure (mmHg): 164/67 Source: Stated Reference Range: 80 - 120 mg / dl Body Mass Index (BMI): 40.6 Electronic Signature(s) Signed: 04/24/2020 5:42:27 PM By: [name] RN, BSN Entered By: [name] on 04/24/2020 11:00:50

## 2020-04-25 NOTE — Progress Notes (Addendum)
MIKELL, KAZLAUSKAS ([id number]) Visit Report for 04/24/2020 Chief Complaint Document Details Patient Name: Date of Service: Bradley Stokes, Bradley Stokes 04/24/2020 10:30 A M Medical Record Number: [id number] Patient Account Number: [id number] Date of Birth/Sex: Treating RN: May 28, 1945 (75 y.o. [name] Primary Care Provider: [name] Other Clinician: Referring Provider: Treating Provider/Extender: [name] in Treatment: 0 Information Obtained from: Patient Chief Complaint 04/24/2020; patient is here for review of bilateral lymphedema Electronic Signature(s) Signed: 04/24/2020 5:14:25 PM By: Linton Ham MD Entered By: Linton Ham on 04/24/2020 12:23:34 -------------------------------------------------------------------------------- HPI Details Patient Name: Date of Service: Bradley Stokes 04/24/2020 10:30 A M Medical Record Number: [id number] Patient Account Number: [id number] Date of Birth/Sex: Treating RN: 10-10-1945 (75 y.o. [name] Primary Care Provider: [name] Other Clinician: Referring Provider: Treating Provider/Extender: [name] in Treatment: 0 History of Present Illness HPI Description: ADMISSION 04/24/2020 Bradley Stokes is now a 75 year old nondiabetic man. He was here in 2010 and again in 2013 he says the last time for a traumatic wound at work in the setting of chronic venous disease and lymphedema. He has stockings at home but hasn't been wearing them for several weeks now. He says his primary provider Dr. Lyndle Herrlich sent him here for review of severe lymphedema with secondary skin changes including dry cracked hyperkeratotic vericused areas bilaterally. He has been using ammonium lactate lotion to try and soak off some of the dry flaking skin but really hadn't any open wound per se. He has not been using his compression stockings for 2 or 3 weeks he states because he is  using the lotion on his legs and he wasn't sure whether to do both. Past medical history includes paroxysmal atrial fibrillation on Coumadin, hypertension, varicose veins and hyperlipidemia, stage III chronic renal failure, hyperlipidemia and probably osteoarthritis Electronic Signature(s) Signed: 04/24/2020 5:14:25 PM By: Linton Ham MD Entered By: Linton Ham on 04/24/2020 12:26:23 -------------------------------------------------------------------------------- Physical Exam Details Patient Name: Date of Service: Bradley Stokes 04/24/2020 10:30 A M Medical Record Number: [id number] Patient Account Number: [id number] Date of Birth/Sex: Treating RN: Nov 23, 1945 (75 y.o. [name] Primary Care Provider: [name] Other Clinician: Referring Provider: Treating Provider/Extender: [name] in Treatment: 0 Constitutional Patient is hypertensive.. Pulse regular and within target range for patient.Marland Kitchen Respirations regular, non-labored and within target range.. Temperature is normal and within the target range for the patient.Marland Kitchen Appears in no distress. Respiratory work of breathing is normal. Cardiovascular Pedal pulses are palpable bilaterally. I don't think there is an obvious arterial issue. Nonpitting edema left greater than right. Notes Wound exam; there was no open wound per se on arrival but he had stasis dermatitis over a large area of the left anterior leg considerable degree of hyperkeratotic, plaque-like areas. Some of these have dried out with the use of the ammonium lactate and he states that everything looks better however removing a lot of this still reveals open wounds on the back of his calf. I did not attempt any aggressive debridement on the right side although there is similar changes but of less severity. There is also less lymphedema on the right leg Electronic Signature(s) Signed: 04/24/2020 5:14:25 PM By: Linton Ham MD Entered By: Linton Ham on 04/24/2020 12:27:51 -------------------------------------------------------------------------------- Physician Orders Details Patient Name: Date of Service: Bradley Stokes. 04/24/2020 10:30 A M Medical Record Number: [id number] Patient Account Number: [id number] Date of Birth/Sex: Treating RN: May 10, 1945 (75 y.o. M)  [name] Primary Care Provider: [name] Other Clinician: Referring Provider: Treating Provider/Extender: [name] in Treatment: 0 Verbal / Phone Orders: No Diagnosis Coding Follow-up Appointments Return Appointment in 1 week. Bathing/ Shower/ Hygiene May shower with protection but do not get wound dressing(s) wet. Edema Control - Lymphedema / SCD / Other Elevate legs to the level of the heart or above for 30 minutes daily and/or when sitting, a frequency of: Avoid standing for long periods of time. Patient to wear own compression stockings every day. - wear your comp. stocking on the right leg Moisturize legs daily. - Also continue using the ammonium lactate as prescribed by your PCP Non Wound Condition Left Lower Extremity pply the following to affected area as directed: - Calcium alginate to dry patches on LLE. apply TCA cream. A Other Non Wound Condition Orders/Instructions: - Apply 4 layer to LLE. Wear your compression stocking on the right lower leg. Electronic Signature(s) Signed: 04/24/2020 5:14:25 PM By: Linton Ham MD Signed: 04/25/2020 5:56:51 PM By: [name] RN Entered By: [name] on 04/24/2020 12:56:31 -------------------------------------------------------------------------------- Problem List Details Patient Name: Date of Service: Bradley Stokes, Bradley Stokes 04/24/2020 10:30 A M Medical Record Number: [id number] Patient Account Number: [id number] Date of Birth/Sex: Treating RN: 09-21-1945 (75 y.o. [name], [name] Primary Care Provider: [name] Other Clinician: Referring Provider: Treating Provider/Extender: [name] in Treatment: 0 Active Problems ICD-10 Encounter Code Description Active Date MDM Diagnosis I89.0 Lymphedema, not elsewhere classified 04/24/2020 No Yes L97.828 Non-pressure chronic ulcer of other part of left lower leg with other specified 04/24/2020 No Yes severity Inactive Problems Resolved Problems Electronic Signature(s) Signed: 04/24/2020 5:14:25 PM By: Linton Ham MD Entered By: Linton Ham on 04/24/2020 12:22:58 -------------------------------------------------------------------------------- Progress Note Details Patient Name: Date of Service: Bradley Stokes 04/24/2020 10:30 A M Medical Record Number: [id number] Patient Account Number: [id number] Date of Birth/Sex: Treating RN: [birth year] (75 y.o. [name] Primary Care Provider: [name] Other Clinician: Referring Provider: Treating Provider/Extender: [name] in Treatment: 0 Subjective Chief Complaint Information obtained from Patient 04/24/2020; patient is here for review of bilateral lymphedema History of Present Illness (HPI) ADMISSION 04/24/2020 Bradley Stokes is now a 75 year old nondiabetic man. He was here in 2010 and again in 2013 he says the last time for a traumatic wound at work in the setting of chronic venous disease and lymphedema. He has stockings at home but hasn't been wearing them for several weeks now. He says his primary provider Dr. Lyndle Herrlich sent him here for review of severe lymphedema with secondary skin changes including dry cracked hyperkeratotic vericused areas bilaterally. He has been using ammonium lactate lotion to try and soak off some of the dry flaking skin but really hadn't any open wound per se. He has not been using his compression stockings for 2 or 3 weeks he states because he is using the lotion on his legs and he  wasn't sure whether to do both. Past medical history includes paroxysmal atrial fibrillation on Coumadin, hypertension, varicose veins and hyperlipidemia, stage III chronic renal failure, hyperlipidemia and probably osteoarthritis Patient History Information obtained from Patient. Allergies adhesive (Reaction: rash) Family History Cancer - Siblings, Heart Disease - Siblings,Mother,Father, Hypertension - Siblings,Father, Kidney Disease - Siblings,Mother, No family history of Diabetes, Hereditary Spherocytosis, Lung Disease, Seizures, Stroke, Thyroid Problems, Tuberculosis. Social History Never smoker, Marital Status - Single, Alcohol Use - Never, Drug Use - No History, Caffeine Use -  Daily - soda. Medical History Eyes Patient has history of Cataracts - removed both eyes Hematologic/Lymphatic Patient has history of Lymphedema Cardiovascular Patient has history of Arrhythmia - afib, Hypertension, Myocardial Infarction, Peripheral Venous Disease Genitourinary Denies history of End Stage Renal Disease Musculoskeletal Patient has history of Gout, Osteoarthritis Denies history of Rheumatoid Arthritis, Osteomyelitis Oncologic Denies history of Received Chemotherapy, Received Radiation Psychiatric Denies history of Anorexia/bulimia, Confinement Anxiety Medical A Surgical History Notes nd Constitutional Symptoms (General Health) obesity Cardiovascular hyperlipidemia Genitourinary CKD stage 3, hx kidney stones Oncologic basal cell skin CA removed Review of Systems (ROS) Constitutional Symptoms (General Health) Denies complaints or symptoms of Fatigue, Fever, Chills, Marked Weight Change. Eyes Complains or has symptoms of Glasses / Contacts - reading. Denies complaints or symptoms of Dry Eyes, Vision Changes. Ear/Nose/Mouth/Throat Denies complaints or symptoms of Chronic sinus problems or rhinitis. Respiratory Denies complaints or symptoms of Chronic or frequent coughs, Shortness  of Breath. Cardiovascular Denies complaints or symptoms of Chest pain. Gastrointestinal Denies complaints or symptoms of Frequent diarrhea, Nausea, Vomiting. Endocrine Denies complaints or symptoms of Heat/cold intolerance. Integumentary (Skin) Denies complaints or symptoms of Wounds. Musculoskeletal Denies complaints or symptoms of Muscle Pain, Muscle Weakness. Neurologic Denies complaints or symptoms of Numbness/parasthesias. Psychiatric Denies complaints or symptoms of Claustrophobia, Suicidal. Objective Constitutional Patient is hypertensive.. Pulse regular and within target range for patient.Marland Kitchen Respirations regular, non-labored and within target range.. Temperature is normal and within the target range for the patient.Marland Kitchen Appears in no distress. Vitals Time Taken: 11:00 AM, Height: 69 in, Source: Stated, Weight: 275 lbs, Source: Stated, BMI: 40.6, Temperature: 97.5 F, Pulse: 73 bpm, Respiratory Rate: 18 breaths/min, Blood Pressure: 164/67 mmHg. Respiratory work of breathing is normal. Cardiovascular Pedal pulses are palpable bilaterally. I don't think there is an obvious arterial issue. Nonpitting edema left greater than right. General Notes: Wound exam; there was no open wound per se on arrival but he had stasis dermatitis over a large area of the left anterior leg considerable degree of hyperkeratotic, plaque-like areas. Some of these have dried out with the use of the ammonium lactate and he states that everything looks better however removing a lot of this still reveals open wounds on the back of his calf. I did not attempt any aggressive debridement on the right side although there is similar changes but of less severity. 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 Bathing/ Shower/ Hygiene: May shower with protection but do not get wound dressing(s) wet. Edema Control - Lymphedema / SCD / Other: Elevate legs to the level of the heart or above for 30 minutes daily and/or when sitting, a frequency of: Avoid standing for long periods of time. Patient to wear own compression stockings every day. - wear your comp. stocking on the right leg Moisturize legs daily. - Also continue using the ammonium lactate as prescribed by your PCP Non Wound Condition: Apply the following to affected area as directed: - Calcium alginate to dry patches on LLE. apply TCA cream. Other Non Wound Condition Orders/Instructions: - Apply 4 layer to LLE. Wear your compression stocking on the right lower leg. 1. Skin changes of chronic severe stage III lymphedema likely secondary to chronic venous insufficiency with stasis dermatitis 2. And removal of copious amounts of dry scaling skin we managed to open some areas up  in the back of his left calf. We did not attempt this on the right side 3. I actually agree with the ammonium lactate prescribed by his primary physician this is usually a gentle exfoliate her and good hydration agent. 4. After some consideration I elected to wrap the left leg see if we can control the swelling get the areas posteriorly to close over. 5. I have also asked him to bring in his stockings next week I wonder whether he is able to get these on and whether the compression is satisfactory. 6. I did not change the ammonium lactate that he is applying to the right leg I think this is the correct approach here Electronic Signature(s) Signed: 04/24/2020 5:14:25 PM By: Linton Ham MD Entered By: Linton Ham on 04/24/2020 12:29:49 -------------------------------------------------------------------------------- HxROS Details Patient Name: Date of Service: Bradley Stokes. 04/24/2020 10:30 A M Medical Record Number: [id number] Patient Account Number:  [id number] Date of Birth/Sex: Treating RN: 10-09-1945 (75 y.o. [name] Primary Care Provider: [name] Other Clinician: Referring Provider: Treating Provider/Extender: [name] in Treatment: 0 Information Obtained From Patient Constitutional Symptoms (General Health) Complaints and Symptoms: Negative for: Fatigue; Fever; Chills; Marked Weight Change Medical History: Past Medical History Notes: obesity Eyes Complaints and Symptoms: Positive for: Glasses / Contacts - reading Negative for: Dry Eyes; Vision Changes Medical History: Positive for: Cataracts - removed both eyes Ear/Nose/Mouth/Throat Complaints and Symptoms: Negative for: Chronic sinus problems or rhinitis Respiratory Complaints and Symptoms: Negative for: Chronic or frequent coughs; Shortness of Breath Cardiovascular Complaints and Symptoms: Negative for: Chest pain Medical History: Positive for: Arrhythmia - afib; Hypertension; Myocardial Infarction; Peripheral Venous Disease Past Medical History Notes: hyperlipidemia Gastrointestinal Complaints and Symptoms: Negative for: Frequent diarrhea; Nausea; Vomiting Endocrine Complaints and Symptoms: Negative for: Heat/cold intolerance Integumentary (Skin) Complaints and Symptoms: Negative for: Wounds Musculoskeletal Complaints and Symptoms: Negative for: Muscle Pain; Muscle Weakness Medical History: Positive for: Gout; Osteoarthritis Negative for: Rheumatoid Arthritis; Osteomyelitis Neurologic Complaints and Symptoms: Negative for: Numbness/parasthesias Psychiatric Complaints and Symptoms: Negative for: Claustrophobia; Suicidal Medical History: Negative for: Anorexia/bulimia; Confinement Anxiety Hematologic/Lymphatic Medical History: Positive for: Lymphedema Genitourinary Medical History: Negative for: End Stage Renal Disease Past Medical History Notes: CKD stage 3, hx kidney  stones Immunological Oncologic Medical History: Negative for: Received Chemotherapy; Received Radiation Past Medical History Notes: basal cell skin CA removed HBO Extended History Items Eyes: Cataracts Immunizations Pneumococcal Vaccine: Received Pneumococcal Vaccination: Yes Implantable Devices None Family and Social History Cancer: Yes - Siblings; Diabetes: No; Heart Disease: Yes - Siblings,Mother,Father; Hereditary Spherocytosis: No; Hypertension: Yes - Siblings,Father; Kidney Disease: Yes - Siblings,Mother; Lung Disease: No; Seizures: No; Stroke: No; Thyroid Problems: No; Tuberculosis: No; Never smoker; Marital Status - Single; Alcohol Use: Never; Drug Use: No History; Caffeine Use: Daily - soda; Financial Concerns: No; Food, Clothing or Shelter Needs: No; Support System Lacking: No; Transportation Concerns: No Engineer, maintenance) Signed: 04/24/2020 5:14:25 PM By: Linton Ham MD Signed: 04/24/2020 5:42:27 PM By: [name] RN, BSN Entered By: [name] on 04/24/2020 11:10:52 -------------------------------------------------------------------------------- SuperBill Details Patient Name: Date of Service: Bradley Stokes, Bradley Stokes 04/24/2020 Medical Record Number: [id number] Patient Account Number: [id number] Date of Birth/Sex: Treating RN: 12-May-1945 (75 y.o. [name] Primary Care Provider: [name] Other Clinician: Referring Provider: Treating Provider/Extender: [name] in Treatment: 0 Diagnosis Coding ICD-10 Codes Code Description I89.0 Lymphedema, not elsewhere classified L97.828 Non-pressure chronic ulcer of other part of left lower leg with other specified severity  Facility Procedures CPT4 Code: 80321224 Description: 82500 - WOUND CARE VISIT-LEV 3 EST PT Modifier: Quantity: 1 CPT4 Code: 37048889 Description: (Facility Use Only) [id number] - [facility] COMPRS LWR LT LEG Modifier: Quantity:  1 Physician Procedures : CPT4 Code Description Modifier [id number] [place] PHYS LEVEL 3 NEW PT ICD-10 Diagnosis Description I89.0 Lymphedema, not elsewhere classified L97.828 Non-pressure chronic ulcer of other part of left lower leg with other specified severity Quantity: 1 Electronic Signature(s) Signed: 04/30/2020 1:23:55 PM By: Linton Ham MD Signed: 05/08/2020 1:48:37 PM By: [name] RN Previous Signature: 04/24/2020 5:14:25 PM Version By: Linton Ham MD Previous Signature: 04/24/2020 5:14:25 PM Version By: Linton Ham MD Previous Signature: 04/25/2020 5:56:51 PM Version By: [name] RN Entered By: [name] on 04/30/2020 11:42:33

## 2020-05-01 ENCOUNTER — Other Ambulatory Visit: Payer: Self-pay

## 2020-05-01 ENCOUNTER — Encounter (HOSPITAL_BASED_OUTPATIENT_CLINIC_OR_DEPARTMENT_OTHER): Payer: Medicare HMO | Attending: Internal Medicine | Admitting: Internal Medicine

## 2020-05-01 DIAGNOSIS — I890 Lymphedema, not elsewhere classified: Secondary | ICD-10-CM | POA: Insufficient documentation

## 2020-05-01 DIAGNOSIS — L97828 Non-pressure chronic ulcer of other part of left lower leg with other specified severity: Secondary | ICD-10-CM | POA: Diagnosis not present

## 2020-05-01 DIAGNOSIS — L97829 Non-pressure chronic ulcer of other part of left lower leg with unspecified severity: Secondary | ICD-10-CM | POA: Diagnosis present

## 2020-05-01 DIAGNOSIS — L97222 Non-pressure chronic ulcer of left calf with fat layer exposed: Secondary | ICD-10-CM | POA: Diagnosis not present

## 2020-05-01 DIAGNOSIS — L97822 Non-pressure chronic ulcer of other part of left lower leg with fat layer exposed: Secondary | ICD-10-CM | POA: Diagnosis not present

## 2020-05-02 DIAGNOSIS — I480 Paroxysmal atrial fibrillation: Secondary | ICD-10-CM | POA: Diagnosis not present

## 2020-05-02 DIAGNOSIS — Z7901 Long term (current) use of anticoagulants: Secondary | ICD-10-CM | POA: Diagnosis not present

## 2020-05-02 DIAGNOSIS — S81802A Unspecified open wound, left lower leg, initial encounter: Secondary | ICD-10-CM | POA: Diagnosis not present

## 2020-05-02 DIAGNOSIS — I872 Venous insufficiency (chronic) (peripheral): Secondary | ICD-10-CM | POA: Diagnosis not present

## 2020-05-02 DIAGNOSIS — S81801A Unspecified open wound, right lower leg, initial encounter: Secondary | ICD-10-CM | POA: Diagnosis not present

## 2020-05-04 DIAGNOSIS — I872 Venous insufficiency (chronic) (peripheral): Secondary | ICD-10-CM | POA: Diagnosis not present

## 2020-05-04 DIAGNOSIS — S81802A Unspecified open wound, left lower leg, initial encounter: Secondary | ICD-10-CM | POA: Diagnosis not present

## 2020-05-04 DIAGNOSIS — S81801A Unspecified open wound, right lower leg, initial encounter: Secondary | ICD-10-CM | POA: Diagnosis not present

## 2020-05-07 NOTE — Progress Notes (Signed)
Bradley Stokes, Bradley Stokes ([id number]) Visit Report for 05/01/2020 Arrival Information Details Patient Name: Date of Service: Bradley Stokes, Bradley Stokes 05/01/2020 12:30 PM Medical Record Number: [id number] Patient Account Number: [id number] Date of Birth/Sex: Treating RN: April 26, 1945 (75 y.o. [name] Primary Care Almin Livingstone: [name] Other Clinician: Referring Izza Bickle: Treating Khalfani Weideman/Extender: [name] in Treatment: 1 Visit Information History Since Last Visit Added or deleted any medications: No Patient Arrived: [name] Any new allergies or adverse reactions: No Arrival Time: 12:46 Had a fall or experienced change in No Accompanied By: self activities of daily living that may affect Transfer Assistance: None risk of falls: Patient Identification Verified: Yes Signs or symptoms of abuse/neglect since last visito No Secondary Verification Process Completed: Yes Hospitalized since last visit: No Patient Requires Transmission-Based Precautions: No Implantable device outside of the clinic excluding No Patient Has Alerts: No cellular tissue based products placed in the center since last visit: Has Dressing in Place as Prescribed: Yes Has Compression in Place as Prescribed: Yes Pain Present Now: No Electronic Signature(s) Signed: 05/01/2020 4:51:35 PM By: [name] RN, BSN Entered By: [name] on 05/01/2020 12:49:27 -------------------------------------------------------------------------------- Compression Therapy Details Patient Name: Date of Service: Bradley Stokes 05/01/2020 12:30 PM Medical Record Number: [id number] Patient Account Number: [id number] Date of Birth/Sex: Treating RN: 11-11-1945 (75 y.o. [name] Primary Care Alyissa Whidbee: [name] Other Clinician: Referring Teagen Mcleary: Treating Audryana Hockenberry/Extender: [name] in Treatment: 1 Compression Therapy Performed for Wound  Assessment: Wound #3 Left,Anterior Lower Leg Performed By: Clinician [name], RN Compression Type: Four Layer Post Procedure Diagnosis Same as Pre-procedure Electronic Signature(s) Signed: 05/07/2020 9:09:54 AM By: [name] RN Entered By: [name] on 05/01/2020 13:14:58 -------------------------------------------------------------------------------- Compression Therapy Details Patient Name: Date of Service: Bradley Stokes, Bradley Stokes 05/01/2020 12:30 PM Medical Record Number: [id number] Patient Account Number: [id number] Date of Birth/Sex: Treating RN: 24-Mar-1946 (75 y.o. [name] Primary Care Ruhama Lehew: [name] Other Clinician: Referring Kamber Vignola: Treating Laurell Coalson/Extender: [name] in Treatment: 1 Compression Therapy Performed for Wound Assessment: Wound #4 Left,Medial Lower Leg Performed By: Clinician [name], RN Compression Type: Four Layer Post Procedure Diagnosis Same as Pre-procedure Electronic Signature(s) Signed: 05/07/2020 9:09:54 AM By: [name] RN Entered By: [name] on 05/01/2020 13:14:58 -------------------------------------------------------------------------------- Compression Therapy Details Patient Name: Date of Service: Bradley Stokes, Bradley Stokes 05/01/2020 12:30 PM Medical Record Number: [id number] Patient Account Number: [id number] Date of Birth/Sex: Treating RN: 1945-11-13 (75 y.o. [name] Primary Care Maryagnes Carrasco: [name] Other Clinician: Referring Adna Nofziger: Treating Slayden Mennenga/Extender: [name] in Treatment: 1 Compression Therapy Performed for Wound Assessment: Wound #5 Left,Proximal,Posterior Lower Leg Performed By: Clinician [name], RN Compression Type: Four Layer Post Procedure Diagnosis Same as Pre-procedure Electronic Signature(s) Signed: 05/07/2020 9:09:54 AM By: [name]  RN Entered By: [name] on 05/01/2020 13:14:58 -------------------------------------------------------------------------------- Compression Therapy Details Patient Name: Date of Service: Bradley Stokes, Bradley Stokes 05/01/2020 12:30 PM Medical Record Number: [id number] Patient Account Number: [id number] Date of Birth/Sex: Treating RN: January 06, 1946 (75 y.o. [name] Primary Care Dshawn Mcnay: [name] Other Clinician: Referring Jaylea Plourde: Treating Slayton Lubitz/Extender: [name] in Treatment: 1 Compression Therapy Performed for Wound Assessment: Wound #6 Left,Posterior Lower Leg Performed By: Clinician [name], RN Compression Type: Four Layer Post Procedure Diagnosis Same as Pre-procedure Electronic Signature(s) Signed: 05/07/2020 9:09:54 AM By: [name] RN Entered By: [name] on 05/01/2020 13:14:59 -------------------------------------------------------------------------------- Encounter Discharge Information  Details Patient Name: Date of Service: Bradley Stokes, Bradley Stokes 05/01/2020 12:30 PM Medical Record Number: [id number] Patient Account Number: [id number] Date of Birth/Sex: Treating RN: [birth year] (75 y.o. [name] Primary Care Laiah Pouncey: [name] Other Clinician: Referring Yisrael Obryan: Treating Zeriah Baysinger/Extender: [name] in Treatment: 1 Encounter Discharge Information Items Discharge Condition: Stable Ambulatory Status: Ambulatory Discharge Destination: Home Transportation: Private Auto Accompanied By: alone Schedule Follow-up Appointment: Yes Clinical Summary of Care: Patient Declined Electronic Signature(s) Signed: 05/01/2020 5:18:27 PM By: Levan Hurst RN, BSN Entered By: Levan Hurst on 05/01/2020 16:36:16 -------------------------------------------------------------------------------- Lower Extremity Assessment Details Patient Name: Date of  Service: Bradley Stokes, Bradley Stokes 05/01/2020 12:30 PM Medical Record Number: [id number] Patient Account Number: [id number] Date of Birth/Sex: Treating RN: [birth year] (75 y.o. [name], [name] Primary Care Kabeer Hoagland: [name] Other Clinician: Referring Symiah Nowotny: Treating Jayden Rudge/Extender: [name] in Treatment: 1 Edema Assessment Assessed: [Left: Yes] [Right: Yes] Edema: [Left: Yes] [Right: Yes] Calf Left: Right: Point of Measurement: 34 cm From Medial Instep 43 cm 47 cm Ankle Left: Right: Point of Measurement: 11 cm From Medial Instep 27 cm 28 cm Knee To Floor Left: Right: From Medial Instep 43 cm 42 cm Vascular Assessment Pulses: Dorsalis Pedis Palpable: [Left:Yes] [Right:Yes] Posterior Tibial Palpable: [Left:Yes] [Right:Yes] Electronic Signature(s) Signed: 05/07/2020 9:09:54 AM By: [name] RN Entered By: [name] on 05/01/2020 13:34:37 -------------------------------------------------------------------------------- Multi Wound Chart Details Patient Name: Date of Service: Bradley Stokes 05/01/2020 12:30 PM Medical Record Number: [id number] Patient Account Number: [id number] Date of Birth/Sex: Treating RN: 03/02/1946 (75 y.o. [name], [name] Primary Care Emily Forse: [name] Other Clinician: Referring Kmya Placide: Treating Aimee Heldman/Extender: [name] in Treatment: 1 Vital Signs Height(in): 69 Pulse(bpm): 82 Weight(lbs): 275 Blood Pressure(mmHg): 182/92 Body Mass Index(BMI): 41 Temperature(F): 98 Respiratory Rate(breaths/min): 18 Photos: [3:No Photos Left, Anterior Lower Leg] [4:No Photos Left, Medial Lower Leg] [5:No Photos Left, Posterior Lower Leg] Wound Location: [3:Gradually Appeared] [4:Gradually Appeared] [5:Gradually Appeared] Wounding Event: [3:Lymphedema] [4:Lymphedema] [5:Lymphedema] Primary Etiology: [3:Cataracts, Lymphedema, Arrhythmia, Cataracts,  Lymphedema, Arrhythmia, Cataracts, Lymphedema, Arrhythmia,] Comorbid History: [3:Hypertension, Myocardial Infarction, Hypertension, Myocardial Infarction, Hypertension, Myocardial Infarction, Peripheral Venous Disease, Gout, Osteoarthritis 04/29/2020] [4:Peripheral Venous Disease, Gout, Osteoarthritis 04/29/2020]  [5:Peripheral Venous Disease, Gout, Osteoarthritis 04/29/2020] Date Acquired: [3:0] [4:0] [5:0] Weeks of Treatment: [3:Open] [4:Open] [5:Open] Wound Status: [3:No] [4:Yes] [5:No] Clustered Wound: [3:N/A] [4:2] [5:N/A] Clustered Quantity: [3:1x1x0.1] [4:4x2x0.1] [5:1.5x2x0.1] Measurements L x W x D (cm) [3:0.785] [4:6.283] [5:2.356] A (cm) : rea [3:0.079] [4:0.628] [5:0.236] Volume (cm) : [3:Full Thickness Without Exposed] [4:Full Thickness Without Exposed] [5:Full Thickness Without Exposed] Classification: [3:Support Structures Medium] [4:Support Structures Medium] [5:Support Structures Medium] Exudate Amount: [3:Serosanguineous] [4:Serosanguineous] [5:Serosanguineous] Exudate Type: [3:red, brown] [4:red, brown] [5:red, brown] Exudate Color: [3:Distinct, outline attached] [4:Distinct, outline attached] [5:Distinct, outline attached] Wound Margin: [3:Large (67-100%)] [4:Large (67-100%)] [5:Large (67-100%)] Granulation Amount: [3:Red, Pink] [4:Red, Pink] [5:Red, Pink] Granulation Quality: [3:Small (1-33%)] [4:Small (1-33%)] [5:Small (1-33%)] Necrotic Amount: [3:Fat Layer (Subcutaneous Tissue): Yes Fat Layer (Subcutaneous Tissue): Yes Fat Layer (Subcutaneous Tissue): Yes] Exposed Structures: [3:Fascia: No Tendon: No Muscle: No Joint: No Bone: No Small (1-33%)] [4:Fascia: No Tendon: No Muscle: No Joint: No Bone: No Small (1-33%)] [5:Fascia: No Tendon: No Muscle: No Joint: No Bone: No Small (1-33%)] Epithelialization: [3:Compression Therapy] [4:Compression Therapy] [5:Compression Therapy] Wound Number: 6 N/A N/A Photos: No Photos N/A N/A Left, Posterior Lower Leg N/A N/A Wound  Location: Gradually Appeared N/A N/A Wounding Event: Lymphedema N/A N/A Primary Etiology: Cataracts, Lymphedema,  Arrhythmia, N/A N/A Comorbid History: Hypertension, Myocardial Infarction, Peripheral Venous Disease, Gout, Osteoarthritis 04/29/2020 N/A N/A Date Acquired: 0 N/A N/A Weeks of Treatment: Open N/A N/A Wound Status: No N/A N/A Clustered Wound: N/A N/A N/A Clustered Quantity: 5x6x0.1 N/A N/A Measurements L x W x D (cm) 23.562 N/A N/A A (cm) : rea 2.356 N/A N/A Volume (cm) : Full Thickness Without Exposed N/A N/A Classification: Support Structures Medium N/A N/A Exudate Amount: Serosanguineous N/A N/A Exudate Type: red, brown N/A N/A Exudate Color: Distinct, outline attached N/A N/A Wound Margin: Large (67-100%) N/A N/A Granulation Amount: Red, Pink N/A N/A Granulation Quality: Small (1-33%) N/A N/A Necrotic Amount: Fat Layer (Subcutaneous Tissue): Yes N/A N/A Exposed Structures: Fascia: No Tendon: No Muscle: No Joint: No Bone: No Small (1-33%) N/A N/A Epithelialization: Compression Therapy N/A N/A Procedures Performed: Treatment Notes Electronic Signature(s) Signed: 05/01/2020 4:57:44 PM By: Linton Ham MD Signed: 05/07/2020 9:09:54 AM By: [name] RN Entered By: Linton Ham on 05/01/2020 13:23:48 -------------------------------------------------------------------------------- Multi-Disciplinary Care Plan Details Patient Name: Date of Service: Bradley Stokes, Bradley Stokes 05/01/2020 12:30 PM Medical Record Number: [id number] Patient Account Number: [id number] Date of Birth/Sex: Treating RN: 1945/10/02 (75 y.o. [name], [name] Primary Care Kalub Morillo: [name] Other Clinician: Referring Onita Pfluger: Treating Cadie Sorci/Extender: [name] in Treatment: 1 Active Inactive Orientation to the Wound Care Program Nursing Diagnoses: Knowledge deficit related to the wound healing center  program Goals: Patient/caregiver will verbalize understanding of the [place] Date Initiated: 04/24/2020 Target Resolution Date: 06/01/2020 Goal Status: Active Interventions: Provide education on orientation to the wound center Notes: Wound/Skin Impairment Nursing Diagnoses: Knowledge deficit related to ulceration/compromised skin integrity Goals: Patient will have a decrease in wound volume by X% from date: (specify in notes) Date Initiated: 04/24/2020 Target Resolution Date: 06/01/2020 Goal Status: Active Patient/caregiver will verbalize understanding of skin care regimen Date Initiated: 04/24/2020 Target Resolution Date: 06/01/2020 Goal Status: Active Ulcer/skin breakdown will have a volume reduction of 30% by week 4 Date Initiated: 04/24/2020 Target Resolution Date: 06/01/2020 Goal Status: Active Interventions: Assess patient/caregiver ability to obtain necessary supplies Assess patient/caregiver ability to perform ulcer/skin care regimen upon admission and as needed Assess ulceration(s) every visit Notes: Electronic Signature(s) Signed: 05/07/2020 9:09:54 AM By: [name] RN Entered By: [name] on 05/01/2020 13:25:55 -------------------------------------------------------------------------------- Pain Assessment Details Patient Name: Date of Service: Bradley Stokes 05/01/2020 12:30 PM Medical Record Number: [id number] Patient Account Number: [id number] Date of Birth/Sex: Treating RN: 19-Jun-1945 (75 y.o. [name] Primary Care Leilynn Pilat: [name] Other Clinician: Referring Twyla Dais: Treating Grove Defina/Extender: [name] in Treatment: 1 Active Problems Location of Pain Severity and Description of Pain Patient Has Paino No Site Locations Rate the pain. Current Pain Level: 0 Pain Management and Medication Current Pain Management: Electronic Signature(s) Signed: 05/01/2020 4:51:35 PM By:  [name] RN, BSN Entered By: [name] on 05/01/2020 12:53:57 -------------------------------------------------------------------------------- Patient/Caregiver Education Details Patient Name: Date of Service: Bradley Stokes 2/1/2022andnbsp12:30 PM Medical Record Number: [id number] Patient Account Number: [id number] Date of Birth/Gender: Treating RN: 11-08-1945 (74 y.o. [name] Primary Care Physician: [name] Other Clinician: Referring Physician: Treating Physician/Extender: [name] in Treatment: 1 Education Assessment Education Provided To: Patient Education Topics Provided Nora: o Methods: Explain/Verbal Responses: State content correctly Electronic Signature(s) Signed: 05/07/2020 9:09:54 AM By: [name] RN Entered By: [name] on 05/01/2020 13:08:12 -------------------------------------------------------------------------------- Wound Assessment Details Patient Name: Date  of Service: Bradley Stokes, Bradley Stokes 05/01/2020 12:30 PM Medical Record Number: [id number] Patient Account Number: [id number] Date of Birth/Sex: Treating RN: [birth year] (75 y.o. [name], [name] Primary Care Manav Pierotti: [name] Other Clinician: Referring Mckinnley Smithey: Treating Nadim Malia/Extender: [name] in Treatment: 1 Wound Status Wound Number: 3 Primary Lymphedema Etiology: Wound Location: Left, Anterior Lower Leg Wound Open Wounding Event: Gradually Appeared Status: Date Acquired: 04/29/2020 Comorbid Cataracts, Lymphedema, Arrhythmia, Hypertension, Myocardial Weeks Of Treatment: 0 History: Infarction, Peripheral Venous Disease, Gout, Osteoarthritis Clustered Wound: No Photos Photo Uploaded By: Mikeal Hawthorne on 05/04/2020 13:55:15 Wound Measurements Length: (cm) 1 Width: (cm) 1 Depth: (cm) 0.1 Area: (cm) 0.785 Volume: (cm) 0.079 %  Reduction in Area: % Reduction in Volume: Epithelialization: Small (1-33%) Tunneling: No Undermining: No Wound Description Classification: Full Thickness Without Exposed Support Structures Wound Margin: Distinct, outline attached Exudate Amount: Medium Exudate Type: Serosanguineous Exudate Color: red, brown Foul Odor After Cleansing: No Slough/Fibrino Yes Wound Bed Granulation Amount: Large (67-100%) Exposed Structure Granulation Quality: Red, Pink Fascia Exposed: No Necrotic Amount: Small (1-33%) Fat Layer (Subcutaneous Tissue) Exposed: Yes Necrotic Quality: Adherent Slough Tendon Exposed: No Muscle Exposed: No Joint Exposed: No Bone Exposed: No Treatment Notes Wound #3 (Lower Leg) Wound Laterality: Left, Anterior Cleanser Normal Saline Discharge Instruction: Cleanse the wound with Normal Saline prior to applying a clean dressing using gauze sponges, not tissue or cotton balls. Soap and Water Discharge Instruction: May shower and wash wound with dial antibacterial soap and water prior to dressing change. Peri-Wound Care Triamcinolone 15 (g) Discharge Instruction: Use triamcinolone 15 (g) as directed Sween Lotion (Moisturizing lotion) Discharge Instruction: Apply moisturizing lotion as directed Topical Primary Dressing Hydrofera Blue Classic Foam, 2x2 in Discharge Instruction: Moisten with saline prior to applying to wound bed Secondary Dressing Woven Gauze Sponge, Non-Sterile 4x4 in Discharge Instruction: Apply over primary dressing as directed. Secured With Compression Wrap FourPress (4 layer compression wrap) Discharge Instruction: Apply four layer compression as directed. Compression Stockings Add-Ons Electronic Signature(s) Signed: 05/07/2020 9:09:54 AM By: [name] RN Entered By: [name] on 05/01/2020 13:03:48 -------------------------------------------------------------------------------- Wound Assessment Details Patient Name: Date of  Service: Bradley Stokes, Bradley Stokes 05/01/2020 12:30 PM Medical Record Number: [id number] Patient Account Number: [id number] Date of Birth/Sex: Treating RN: 1945-08-22 (75 y.o. [name], [name] Primary Care Marcene Laskowski: [name] Other Clinician: Referring Janisha Bueso: Treating Suheily Birks/Extender: [name] in Treatment: 1 Wound Status Wound Number: 4 Primary Lymphedema Etiology: Wound Location: Left, Medial Lower Leg Wound Open Wounding Event: Gradually Appeared Status: Date Acquired: 04/29/2020 Comorbid Cataracts, Lymphedema, Arrhythmia, Hypertension, Myocardial Weeks Of Treatment: 0 History: Infarction, Peripheral Venous Disease, Gout, Osteoarthritis Clustered Wound: Yes Photos Photo Uploaded By: Mikeal Hawthorne on 05/04/2020 13:55:16 Wound Measurements Length: (cm) 4 Width: (cm) 2 Depth: (cm) 0.1 Clustered Quantity: 2 Area: (cm) 6.283 Volume: (cm) 0.628 % Reduction in Area: % Reduction in Volume: Epithelialization: Small (1-33%) Tunneling: No Undermining: No Wound Description Classification: Full Thickness Without Exposed Support Structures Wound Margin: Distinct, outline attached Exudate Amount: Medium Exudate Type: Serosanguineous Exudate Color: red, brown Foul Odor After Cleansing: No Slough/Fibrino Yes Wound Bed Granulation Amount: Large (67-100%) Exposed Structure Granulation Quality: Red, Pink Fascia Exposed: No Necrotic Amount: Small (1-33%) Fat Layer (Subcutaneous Tissue) Exposed: Yes Necrotic Quality: Adherent Slough Tendon Exposed: No Muscle Exposed: No Joint Exposed: No Bone Exposed: No Treatment Notes Wound #4 (Lower Leg) Wound Laterality: Left, Medial Cleanser Normal Saline Discharge Instruction: Cleanse the wound with Normal Saline prior to applying a clean  dressing using gauze sponges, not tissue or cotton balls. Soap and Water Discharge Instruction: May shower and wash wound with dial antibacterial soap and  water prior to dressing change. Peri-Wound Care Triamcinolone 15 (g) Discharge Instruction: Use triamcinolone 15 (g) as directed Sween Lotion (Moisturizing lotion) Discharge Instruction: Apply moisturizing lotion as directed Topical Primary Dressing Hydrofera Blue Classic Foam, 2x2 in Discharge Instruction: Moisten with saline prior to applying to wound bed Secondary Dressing Woven Gauze Sponge, Non-Sterile 4x4 in Discharge Instruction: Apply over primary dressing as directed. Secured With Compression Wrap FourPress (4 layer compression wrap) Discharge Instruction: Apply four layer compression as directed. Compression Stockings Add-Ons Electronic Signature(s) Signed: 05/07/2020 9:09:54 AM By: [name] RN Entered By: [name] on 05/01/2020 13:04:53 -------------------------------------------------------------------------------- Wound Assessment Details Patient Name: Date of Service: Bradley Stokes, Bradley Stokes 05/01/2020 12:30 PM Medical Record Number: [id number] Patient Account Number: [id number] Date of Birth/Sex: Treating RN: [birth year] (75 y.o. [name], [name] Primary Care Cleophus Mendonsa: [name] Other Clinician: Referring Juda Lajeunesse: Treating Margot Oriordan/Extender: [name] in Treatment: 1 Wound Status Wound Number: 5 Primary Lymphedema Etiology: Wound Location: Left, Posterior Lower Leg Wound Open Wounding Event: Gradually Appeared Status: Date Acquired: 04/29/2020 Comorbid Cataracts, Lymphedema, Arrhythmia, Hypertension, Myocardial Weeks Of Treatment: 0 History: Infarction, Peripheral Venous Disease, Gout, Osteoarthritis Clustered Wound: No Photos Photo Uploaded By: Mikeal Hawthorne on 05/04/2020 13:55:36 Wound Measurements Length: (cm) 1.5 Width: (cm) 2 Depth: (cm) 0.1 Area: (cm) 2.356 Volume: (cm) 0.236 % Reduction in Area: % Reduction in Volume: Epithelialization: Small (1-33%) Tunneling: No Undermining:  No Wound Description Classification: Full Thickness Without Exposed Support Structures Wound Margin: Distinct, outline attached Exudate Amount: Medium Exudate Type: Serosanguineous Exudate Color: red, brown Foul Odor After Cleansing: No Slough/Fibrino Yes Wound Bed Granulation Amount: Large (67-100%) Exposed Structure Granulation Quality: Red, Pink Fascia Exposed: No Necrotic Amount: Small (1-33%) Fat Layer (Subcutaneous Tissue) Exposed: Yes Necrotic Quality: Adherent Slough Tendon Exposed: No Muscle Exposed: No Joint Exposed: No Bone Exposed: No Electronic Signature(s) Signed: 05/07/2020 9:09:54 AM By: [name] RN Entered By: [name] on 05/01/2020 13:05:50 -------------------------------------------------------------------------------- Wound Assessment Details Patient Name: Date of Service: Bradley Stokes 05/01/2020 12:30 PM Medical Record Number: [id number] Patient Account Number: [id number] Date of Birth/Sex: Treating RN: [birth year] (75 y.o. [name], [name] Primary Care Shaquinta Peruski: [name] Other Clinician: Referring Aspin Palomarez: Treating Cleston Lautner/Extender: [name] in Treatment: 1 Wound Status Wound Number: 6 Primary Lymphedema Etiology: Wound Location: Left, Posterior Lower Leg Wound Open Wounding Event: Gradually Appeared Status: Date Acquired: 04/29/2020 Comorbid Cataracts, Lymphedema, Arrhythmia, Hypertension, Myocardial Weeks Of Treatment: 0 History: Infarction, Peripheral Venous Disease, Gout, Osteoarthritis Clustered Wound: No Photos Photo Uploaded By: Mikeal Hawthorne on 05/04/2020 13:55:36 Wound Measurements Length: (cm) 5 Width: (cm) 6 Depth: (cm) 0.1 Area: (cm) 23.562 Volume: (cm) 2.356 % Reduction in Area: % Reduction in Volume: Epithelialization: Small (1-33%) Tunneling: No Undermining: No Wound Description Classification: Full Thickness Without Exposed Support Structures Wound  Margin: Distinct, outline attached Exudate Amount: Medium Exudate Type: Serosanguineous Exudate Color: red, brown Foul Odor After Cleansing: No Slough/Fibrino Yes Wound Bed Granulation Amount: Large (67-100%) Exposed Structure Granulation Quality: Red, Pink Fascia Exposed: No Necrotic Amount: Small (1-33%) Fat Layer (Subcutaneous Tissue) Exposed: Yes Necrotic Quality: Adherent Slough Tendon Exposed: No Muscle Exposed: No Joint Exposed: No Bone Exposed: No Treatment Notes Wound #6 (Lower Leg) Wound Laterality: Left, Posterior Cleanser Normal Saline Discharge Instruction: Cleanse the wound with Normal Saline prior to applying a clean dressing using gauze sponges,  not tissue or cotton balls. Soap and Water Discharge Instruction: May shower and wash wound with dial antibacterial soap and water prior to dressing change. Peri-Wound Care Triamcinolone 15 (g) Discharge Instruction: Use triamcinolone 15 (g) as directed Sween Lotion (Moisturizing lotion) Discharge Instruction: Apply moisturizing lotion as directed Topical Primary Dressing Hydrofera Blue Classic Foam, 2x2 in Discharge Instruction: Moisten with saline prior to applying to wound bed Secondary Dressing Woven Gauze Sponge, Non-Sterile 4x4 in Discharge Instruction: Apply over primary dressing as directed. Secured With Compression Wrap FourPress (4 layer compression wrap) Discharge Instruction: Apply four layer compression as directed. Compression Stockings Circaid Juxta Lite Compression Wrap Quantity: 1 Left Leg Compression Amount: 30-40 mmHg Discharge Instruction: Apply Circaid Juxta Lite Compression Wrap daily as instructed. Apply first thing in the morning, remove at night before bed. Add-Ons Electronic Signature(s) Signed: 05/07/2020 9:09:54 AM By: [name] RN Entered By: [name] on 05/01/2020 13:06:50 -------------------------------------------------------------------------------- Wound  Assessment Details Patient Name: Date of Service: Bradley Stokes, Bradley Stokes 05/01/2020 12:30 PM Medical Record Number: [id number] Patient Account Number: [id number] Date of Birth/Sex: Treating RN: 1945-09-26 (75 y.o. [name], [place] Primary Care Careli Luzader: [name] Other Clinician: Referring Trudee Chirino: Treating Geral Coker/Extender: [name] in Treatment: 1 Wound Status Wound Number: 7 Primary Lymphedema Etiology: Wound Location: Right, Medial Lower Leg Wound Open Wounding Event: Gradually Appeared Status: Date Acquired: 04/29/2020 Comorbid Cataracts, Lymphedema, Arrhythmia, Hypertension, Myocardial Weeks Of Treatment: 0 History: Infarction, Peripheral Venous Disease, Gout, Osteoarthritis Clustered Wound: No Wound Measurements Length: (cm) 0.2 Width: (cm) 0.2 Depth: (cm) 0.1 Area: (cm) 0.031 Volume: (cm) 0.003 % Reduction in Area: % Reduction in Volume: Epithelialization: None Tunneling: No Undermining: No Wound Description Classification: Full Thickness Without Exposed Support Structures Wound Margin: Distinct, outline attached Exudate Amount: Medium Exudate Type: Serosanguineous Exudate Color: red, brown Foul Odor After Cleansing: No Slough/Fibrino No Wound Bed Granulation Amount: Large (67-100%) Exposed Structure Granulation Quality: Red, Pink Fascia Exposed: No Necrotic Amount: None Present (0%) Fat Layer (Subcutaneous Tissue) Exposed: No Tendon Exposed: No Muscle Exposed: No Joint Exposed: No Bone Exposed: No Treatment Notes Wound #7 (Lower Leg) Wound Laterality: Right, Medial Cleanser Normal Saline Discharge Instruction: Cleanse the wound with Normal Saline prior to applying a clean dressing using gauze sponges, not tissue or cotton balls. Soap and Water Discharge Instruction: May shower and wash wound with dial antibacterial soap and water prior to dressing change. Peri-Wound Care Topical Primary  Dressing KerraCel Ag Gelling Fiber Dressing, 4x5 in (silver alginate) Discharge Instruction: Apply silver alginate to wound bed as instructed Secondary Dressing Woven Gauze Sponge, Non-Sterile 4x4 in Discharge Instruction: Apply over primary dressing as directed. ComfortFoam Border, 3x3 in (silicone border) Discharge Instruction: Apply over primary dressing as directed. Secured With Compression Wrap Compression Stockings Circaid Juxta Lite Compression Wrap Quantity: 1 Right Leg Compression Amount: 30-40 mmHg Discharge Instruction: Apply Circaid Juxta Lite Compression Wrap daily as instructed. Apply first thing in the morning, remove at night before bed. Add-Ons Electronic Signature(s) Signed: 05/07/2020 9:09:54 AM By: [name] RN Entered By: [name] on 05/01/2020 13:27:52 -------------------------------------------------------------------------------- Vitals Details Patient Name: Date of Service: Bradley Stokes 05/01/2020 12:30 PM Medical Record Number: [id number] Patient Account Number: [id number] Date of Birth/Sex: Treating RN: Dec 05, 1945 (75 y.o. [name] Primary Care Kaius Daino: Milagros Evener R Other Clinician: Referring Cashton Hosley: Treating Ania Levay/Extender: [name] in Treatment: 1 Vital Signs Time Taken: 12:49 Temperature (F): 98 Height (in): 69 Pulse (bpm): 82 Weight (lbs): 275 Respiratory Rate (breaths/min):  18 Body Mass Index (BMI): 40.6 Blood Pressure (mmHg): 182/92 Reference Range: 80 - 120 mg / dl Electronic Signature(s) Signed: 05/01/2020 4:51:35 PM By: [name] RN, BSN Entered By: [name] on 05/01/2020 12:53:49

## 2020-05-07 NOTE — Progress Notes (Signed)
Bradley Stokes ([id number]) Visit Report for 05/01/2020 HPI Details Patient Name: Date of Service: Bradley Stokes, Bradley Stokes 05/01/2020 12:30 PM Medical Record Number: [id number] Patient Account Number: [id number] Date of Birth/Sex: Treating RN: [birth year] Stokes (75 y.o. Bradley Stokes Primary Care Provider: Aretta Stokes Other Clinician: Referring Provider: Treating Provider/Extender: Bradley Stokes in Stokes: 1 History of Present Illness HPI Description: ADMISSION 04/24/2020 Bradley Stokes is now a 75 year old nondiabetic man. He was here in 2010 and again in 2013 he says the last time for a traumatic wound at work in the setting of chronic venous disease and lymphedema. He has stockings at home but hasn't been wearing them for several Stokes now. He says his primary provider Bradley Stokes sent him here for review of severe lymphedema with secondary skin changes including dry cracked hyperkeratotic vericused areas bilaterally. He has been using ammonium lactate lotion to try and soak off some of the dry flaking skin but really hadn't any open wound per se. He has not been using his compression stockings for 2 or 3 Stokes he states because he is using the lotion on his legs and he wasn't sure whether to do both. Past medical history includes paroxysmal atrial fibrillation on Coumadin, hypertension, varicose veins and hyperlipidemia, stage III chronic renal failure, hyperlipidemia and probably osteoarthritis 2/1; severe bilateral lymphedema chronic venous insufficiency. Skin changes of lymphedema chronic xerotic skin. He had open areas on the posterior left calf last time. Arrives in clinic with much better edema control. He also has superficial areas on the right anterior were just noticing this week Electronic Signature(s) Signed: 05/01/2020 4:57:44 PM By: Bradley Ham MD Entered By: Bradley Stokes on 05/01/2020  13:26:02 -------------------------------------------------------------------------------- Physical Exam Details Patient Name: Date of Service: Bradley Stokes, Bradley Stokes 05/01/2020 12:30 PM Medical Record Number: [id number] Patient Account Number: [id number] Date of Birth/Sex: Treating RN: [birth year] (75 y.o. Bradley Stokes Primary Care Provider: Aretta Stokes Other Clinician: Referring Provider: Treating Provider/Extender: Bradley Stokes in Stokes: 1 Constitutional Patient is hypertensive.. Pulse regular and within target range for patient.Marland Kitchen Respirations regular, non-labored and within target range.. Temperature is normal and within the target range for the patient.Marland Kitchen Appears in no distress. Cardiovascular Pedal pulses are palpable. Edema present in both extremities.. Notes Wound exam; left leg has open wounds posteriorly. On the right leg a few small open areas anteriorly. He does not have adequate stockings we wrapped the left leg much better edema control in this area. Electronic Signature(s) Signed: 05/01/2020 4:57:44 PM By: Bradley Ham MD Entered By: Bradley Stokes on 05/01/2020 13:29:42 -------------------------------------------------------------------------------- Physician Orders Details Patient Name: Date of Service: Bradley Stokes, Bradley Stokes 05/01/2020 12:30 PM Medical Record Number: [id number] Patient Account Number: [id number] Date of Birth/Sex: Treating RN: Bradley Stokes (75 y.o. Bradley Stokes Primary Care Provider: Aretta Stokes Other Clinician: Referring Provider: Treating Provider/Extender: Bradley Stokes: 1 Verbal / Phone Orders: No Diagnosis Coding Follow-up Appointments Return Appointment in 1 week. Bathing/ Shower/ Hygiene May shower with protection but do not get wound dressing(s) wet. Edema Control - Lymphedema / SCD / Other Elevate legs to the level of the heart or above for 30  minutes daily and/or when sitting, a frequency of: Avoid standing for long periods of time. Patient to wear own compression stockings every day. - wear your comp. stocking on the right leg until the juxtalite come in Moisturize legs daily. - Also continue using the ammonium lactate as prescribed by your  PCP Other Edema Control Orders/Instructions: - ordering juxtalite compression stockings for both legs today in the office Wound Stokes Wound #3 - Lower Leg Wound Laterality: Left, Anterior Cleanser: Normal Saline 1 x Per Week/15 Days Discharge Instructions: Cleanse the wound with Normal Saline prior to applying a clean dressing using gauze sponges, not tissue or cotton balls. Cleanser: Soap and Water 1 x Per Week/15 Days Discharge Instructions: May shower and wash wound with dial antibacterial soap and water prior to dressing change. Peri-Wound Care: Triamcinolone 15 (g) 1 x Per Week/15 Days Discharge Instructions: Use triamcinolone 15 (g) as directed Peri-Wound Care: Sween Lotion (Moisturizing lotion) 1 x Per Week/15 Days Discharge Instructions: Apply moisturizing lotion as directed Prim Dressing: Hydrofera Blue Classic Foam, 2x2 in 1 x Per Week/15 Days ary Discharge Instructions: Moisten with saline prior to applying to wound bed Secondary Dressing: Woven Gauze Sponge, Non-Sterile 4x4 in 1 x Per Week/15 Days Discharge Instructions: Apply over primary dressing as directed. Compression Wrap: FourPress (4 layer compression wrap) 1 x Per Week/15 Days Discharge Instructions: Apply four layer compression as directed. Wound #4 - Lower Leg Wound Laterality: Left, Medial Cleanser: Normal Saline 1 x Per Week/15 Days Discharge Instructions: Cleanse the wound with Normal Saline prior to applying a clean dressing using gauze sponges, not tissue or cotton balls. Cleanser: Soap and Water 1 x Per Week/15 Days Discharge Instructions: May shower and wash wound with dial antibacterial soap and water prior  to dressing change. Peri-Wound Care: Triamcinolone 15 (g) 1 x Per Week/15 Days Discharge Instructions: Use triamcinolone 15 (g) as directed Peri-Wound Care: Sween Lotion (Moisturizing lotion) 1 x Per Week/15 Days Discharge Instructions: Apply moisturizing lotion as directed Prim Dressing: Hydrofera Blue Classic Foam, 2x2 in 1 x Per Week/15 Days ary Discharge Instructions: Moisten with saline prior to applying to wound bed Secondary Dressing: Woven Gauze Sponge, Non-Sterile 4x4 in 1 x Per Week/15 Days Discharge Instructions: Apply over primary dressing as directed. Compression Wrap: FourPress (4 layer compression wrap) 1 x Per Week/15 Days Discharge Instructions: Apply four layer compression as directed. Wound #5 - Lower Leg Wound Laterality: Left, Posterior, Proximal Cleanser: Normal Saline 1 x Per Week/15 Days Discharge Instructions: Cleanse the wound with Normal Saline prior to applying a clean dressing using gauze sponges, not tissue or cotton balls. Cleanser: Soap and Water 1 x Per Week/15 Days Discharge Instructions: May shower and wash wound with dial antibacterial soap and water prior to dressing change. Peri-Wound Care: Triamcinolone 15 (g) 1 x Per Week/15 Days Discharge Instructions: Use triamcinolone 15 (g) as directed Peri-Wound Care: Sween Lotion (Moisturizing lotion) 1 x Per Week/15 Days Discharge Instructions: Apply moisturizing lotion as directed Prim Dressing: Hydrofera Blue Classic Foam, 2x2 in 1 x Per Week/15 Days ary Discharge Instructions: Moisten with saline prior to applying to wound bed Secondary Dressing: Woven Gauze Sponge, Non-Sterile 4x4 in 1 x Per Week/15 Days Discharge Instructions: Apply over primary dressing as directed. Compression Wrap: FourPress (4 layer compression wrap) 1 x Per Week/15 Days Discharge Instructions: Apply four layer compression as directed. Wound #6 - Lower Leg Wound Laterality: Left, Posterior Cleanser: Normal Saline 1 x Per Week/15  Days Discharge Instructions: Cleanse the wound with Normal Saline prior to applying a clean dressing using gauze sponges, not tissue or cotton balls. Cleanser: Soap and Water 1 x Per Week/15 Days Discharge Instructions: May shower and wash wound with dial antibacterial soap and water prior to dressing change. Peri-Wound Care: Triamcinolone 15 (g) 1 x Per Week/15 Days Discharge Instructions:  Use triamcinolone 15 (g) as directed Peri-Wound Care: Sween Lotion (Moisturizing lotion) 1 x Per Week/15 Days Discharge Instructions: Apply moisturizing lotion as directed Prim Dressing: Hydrofera Blue Classic Foam, 2x2 in 1 x Per Week/15 Days ary Discharge Instructions: Moisten with saline prior to applying to wound bed Secondary Dressing: Woven Gauze Sponge, Non-Sterile 4x4 in 1 x Per Week/15 Days Discharge Instructions: Apply over primary dressing as directed. Compression Wrap: FourPress (4 layer compression wrap) 1 x Per Week/15 Days Discharge Instructions: Apply four layer compression as directed. Compression Stockings: Circaid Juxta Lite Compression Wrap (DME) Left Leg Compression Amount: 30-40 mmHG Discharge Instructions: Apply Circaid Juxta Lite Compression Wrap daily as instructed. Apply first thing in the morning, remove at night before bed. Wound #7 - Lower Leg Wound Laterality: Right, Medial Cleanser: Normal Saline (DME) (Generic) Every Other Day/7 Days Discharge Instructions: Cleanse the wound with Normal Saline prior to applying a clean dressing using gauze sponges, not tissue or cotton balls. Cleanser: Soap and Water Every Other Day/7 Days Discharge Instructions: May shower and wash wound with dial antibacterial soap and water prior to dressing change. Prim Dressing: KerraCel Ag Gelling Fiber Dressing, 4x5 in (silver alginate) (DME) (Generic) Every Other Day/7 Days ary Discharge Instructions: Apply silver alginate to wound bed as instructed Secondary Dressing: Woven Gauze Sponge,  Non-Sterile 4x4 in (DME) (Generic) Every Other Day/7 Days Discharge Instructions: Apply over primary dressing as directed. Secondary Dressing: ComfortFoam Border, 3x3 in (silicone border) (DME) (Generic) Every Other Day/7 Days Discharge Instructions: Apply over primary dressing as directed. Compression Stockings: Circaid Juxta Lite Compression Wrap (DME) Right Leg Compression Amount: 30-40 mmHG Discharge Instructions: Apply Circaid Juxta Lite Compression Wrap daily as instructed. Apply first thing in the morning, remove at night before bed. Electronic Signature(s) Signed: 05/01/2020 4:57:44 PM By: Bradley Ham MD Signed: 05/07/2020 9:09:54 AM By: [name] RN Entered By: [name] on 05/01/2020 13:39:46 -------------------------------------------------------------------------------- Problem List Details Patient Name: Date of Service: Bradley Stokes, Bradley Stokes 05/01/2020 12:30 PM Medical Record Number: [id number] Patient Account Number: [id number] Date of Birth/Sex: Treating RN: [birth year]Stokes (75 y.o. [name], [name] Primary Care Provider: Aretta Stokes Other Clinician: Referring Provider: Treating Provider/Extender: Bradley Stokes in Stokes: 1 Active Problems ICD-10 Encounter Code Description Active Date MDM Diagnosis I89.0 Lymphedema, not elsewhere classified 04/24/2020 No Yes L97.828 Non-pressure chronic ulcer of other part of left lower leg with other specified 04/24/2020 No Yes severity L97.811 Non-pressure chronic ulcer of other part of right lower leg limited to breakdown 05/01/2020 No Yes of skin Inactive Problems Resolved Problems Electronic Signature(s) Signed: 05/01/2020 4:57:44 PM By: Bradley Ham MD Entered By: Bradley Stokes on 05/01/2020 13:31:44 -------------------------------------------------------------------------------- Progress Note Details Patient Name: Date of Service: Bradley Stokes 05/01/2020 12:30 PM Medical  Record Number: [id number] Patient Account Number: [id number] Date of Birth/Sex: Treating RN: [birth year]Stokes (75 y.o. Bradley Stokes Primary Care Provider: Aretta Stokes Other Clinician: Referring Provider: Treating Provider/Extender: Bradley Stokes in Stokes: 1 Subjective History of Present Illness (HPI) ADMISSION 04/24/2020 Mr. Adkins is now a 75 year old nondiabetic man. He was here in 2010 and again in 2013 he says the last time for a traumatic wound at work in the setting of chronic venous disease and lymphedema. He has stockings at home but hasn't been wearing them for several Stokes now. He says his primary provider Bradley Stokes sent him here for review of severe lymphedema with secondary skin changes including dry cracked hyperkeratotic vericused areas bilaterally. He has been  using ammonium lactate lotion to try and soak off some of the dry flaking skin but really hadn't any open wound per se. He has not been using his compression stockings for 2 or 3 Stokes he states because he is using the lotion on his legs and he wasn't sure whether to do both. Past medical history includes paroxysmal atrial fibrillation on Coumadin, hypertension, varicose veins and hyperlipidemia, stage III chronic renal failure, hyperlipidemia and probably osteoarthritis 2/1; severe bilateral lymphedema chronic venous insufficiency. Skin changes of lymphedema chronic xerotic skin. He had open areas on the posterior left calf last time. Arrives in clinic with much better edema control. He also has superficial areas on the right anterior were just noticing this week Objective Constitutional Patient is hypertensive.. Pulse regular and within target range for patient.Marland Kitchen Respirations regular, non-labored and within target range.. Temperature is normal and within the target range for the patient.Marland Kitchen Appears in no distress. Vitals Time Taken: 12:49 PM, Height: 69 in, Weight: 275 lbs, BMI:  40.6, Temperature: 98 F, Pulse: 82 bpm, Respiratory Rate: 18 breaths/min, Blood Pressure: 182/92 mmHg. Cardiovascular Pedal pulses are palpable. Edema present in both extremities.. General Notes: Wound exam; left leg has open wounds posteriorly. On the right leg a few small open areas anteriorly. He does not have adequate stockings we wrapped the left leg much better edema control in this area. Integumentary (Hair, Skin) Wound #3 status is Open. Original cause of wound was Gradually Appeared. The wound is located on the Left,Anterior Lower Leg. The wound measures 1cm length x 1cm width x 0.1cm depth; 0.785cm^2 area and 0.079cm^3 volume. There is Fat Layer (Subcutaneous Tissue) exposed. There is no tunneling or undermining noted. There is a medium amount of serosanguineous drainage noted. The wound margin is distinct with the outline attached to the wound base. There is large (67-100%) red, pink granulation within the wound bed. There is a small (1-33%) amount of necrotic tissue within the wound bed including Adherent Slough. Wound #4 status is Open. Original cause of wound was Gradually Appeared. The wound is located on the Left,Medial Lower Leg. The wound measures 4cm length x 2cm width x 0.1cm depth; 6.283cm^2 area and 0.628cm^3 volume. There is Fat Layer (Subcutaneous Tissue) exposed. There is no tunneling or undermining noted. There is a medium amount of serosanguineous drainage noted. The wound margin is distinct with the outline attached to the wound base. There is large (67-100%) red, pink granulation within the wound bed. There is a small (1-33%) amount of necrotic tissue within the wound bed including Adherent Slough. Wound #5 status is Open. Original cause of wound was Gradually Appeared. The wound is located on the Left,Proximal,Posterior Lower Leg. The wound measures 1.5cm length x 2cm width x 0.1cm depth; 2.356cm^2 area and 0.236cm^3 volume. There is Fat Layer (Subcutaneous Tissue)  exposed. There is no tunneling or undermining noted. There is a medium amount of serosanguineous drainage noted. The wound margin is distinct with the outline attached to the wound base. There is large (67-100%) red, pink granulation within the wound bed. There is a small (1-33%) amount of necrotic tissue within the wound bed including Adherent Slough. Wound #6 status is Open. Original cause of wound was Gradually Appeared. The wound is located on the Left,Posterior Lower Leg. The wound measures 5cm length x 6cm width x 0.1cm depth; 23.562cm^2 area and 2.356cm^3 volume. There is Fat Layer (Subcutaneous Tissue) exposed. There is no tunneling or undermining noted. There is a medium amount of serosanguineous drainage noted.  The wound margin is distinct with the outline attached to the wound base. There is large (67-100%) red, pink granulation within the wound bed. There is a small (1-33%) amount of necrotic tissue within the wound bed including Adherent Slough. Wound #7 status is Open. Original cause of wound was Gradually Appeared. The wound is located on the Right,Medial Lower Leg. The wound measures 0.2cm length x 0.2cm width x 0.1cm depth; 0.031cm^2 area and 0.003cm^3 volume. There is no tunneling or undermining noted. There is a medium amount of serosanguineous drainage noted. The wound margin is distinct with the outline attached to the wound base. There is large (67-100%) red, pink granulation within the wound bed. There is no necrotic tissue within the wound bed. 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 There was a Four Layer Compression Therapy Procedure by [name], RN. Post procedure Diagnosis Wound #3: Same as Pre-Procedure Wound #4 Pre-procedure diagnosis of Wound #4 is a Lymphedema located on the Left,Medial Lower Leg . There was a Four Layer Compression Therapy Procedure by [name], RN. Post procedure Diagnosis Wound #4: Same as Pre-Procedure Wound #5 Pre-procedure diagnosis of Wound #5 is a Lymphedema located on the Left,Proximal,Posterior Lower Leg . There was a Four Layer Compression Therapy Procedure by [name], RN. Post procedure Diagnosis Wound #5: Same as Pre-Procedure Wound #6 Pre-procedure diagnosis of Wound #6 is a Lymphedema located on the Left,Posterior Lower Leg . There was a Four Layer Compression Therapy Procedure by [name], RN. Post procedure Diagnosis Wound #6: Same as Pre-Procedure Plan Follow-up Appointments: Return Appointment in 1 week. Bathing/ Shower/ Hygiene: May shower with protection but do not get wound dressing(s) wet. Edema Control - Lymphedema / SCD / Other: Elevate legs to the level of the heart or above for 30 minutes daily and/or when sitting, a frequency of: Avoid standing for long periods of time. Patient to wear own compression stockings every day. - wear your comp. stocking on the right leg until the juxtalite come in Moisturize legs daily. - Also continue using the ammonium lactate as prescribed by your PCP Other Edema Control Orders/Instructions: - ordering juxtalite compression stockings for both legs today in the office WOUND #3: - Lower Leg Wound Laterality: Left, Anterior Cleanser: Normal Saline 1 x Per Week/15 Days Discharge Instructions: Cleanse the wound with Normal Saline prior to applying a clean dressing using gauze sponges, not tissue or cotton balls. Cleanser: Soap and Water 1 x Per Week/15 Days Discharge Instructions: May shower and wash wound with dial antibacterial soap and water prior to dressing change. Peri-Wound Care: Triamcinolone 15 (g) 1 x Per  Week/15 Days Discharge Instructions: Use triamcinolone 15 (g) as directed Peri-Wound Care: Sween Lotion (Moisturizing lotion) 1 x Per Week/15 Days Discharge Instructions: Apply moisturizing lotion as directed Prim Dressing: Hydrofera Blue Classic Foam, 2x2 in 1 x Per Week/15 Days ary Discharge Instructions: Moisten with saline prior to applying to wound bed Secondary Dressing: Woven Gauze Sponge, Non-Sterile 4x4 in 1 x Per Week/15 Days Discharge Instructions: Apply over primary dressing as directed. Com pression Wrap: FourPress (4 layer compression wrap) 1 x Per Week/15 Days Discharge Instructions: Apply four layer compression as directed. Com pression Stockings: Circaid Juxta Lite Compression Wrap (  DME) Compression Amount: 30-40 mmHg (left) Compression Amount: 30-40 mmHg (right) Discharge Instructions: Apply Circaid Juxta Lite Compression Wrap daily as instructed. Apply first thing in the morning, remove at night before bed. WOUND #4: - Lower Leg Wound Laterality: Left, Medial Cleanser: Normal Saline 1 x Per Week/15 Days Discharge Instructions: Cleanse the wound with Normal Saline prior to applying a clean dressing using gauze sponges, not tissue or cotton balls. Cleanser: Soap and Water 1 x Per Week/15 Days Discharge Instructions: May shower and wash wound with dial antibacterial soap and water prior to dressing change. Peri-Wound Care: Triamcinolone 15 (g) 1 x Per Week/15 Days Discharge Instructions: Use triamcinolone 15 (g) as directed Peri-Wound Care: Sween Lotion (Moisturizing lotion) 1 x Per Week/15 Days Discharge Instructions: Apply moisturizing lotion as directed Prim Dressing: Hydrofera Blue Classic Foam, 2x2 in 1 x Per Week/15 Days ary Discharge Instructions: Moisten with saline prior to applying to wound bed Secondary Dressing: Woven Gauze Sponge, Non-Sterile 4x4 in 1 x Per Week/15 Days Discharge Instructions: Apply over primary dressing as directed. Com pression Wrap:  FourPress (4 layer compression wrap) 1 x Per Week/15 Days Discharge Instructions: Apply four layer compression as directed. WOUND #5: - Lower Leg Wound Laterality: Left, Posterior, Proximal Cleanser: Normal Saline 1 x Per Week/15 Days Discharge Instructions: Cleanse the wound with Normal Saline prior to applying a clean dressing using gauze sponges, not tissue or cotton balls. Cleanser: Soap and Water 1 x Per Week/15 Days Discharge Instructions: May shower and wash wound with dial antibacterial soap and water prior to dressing change. Peri-Wound Care: Triamcinolone 15 (g) 1 x Per Week/15 Days Discharge Instructions: Use triamcinolone 15 (g) as directed Peri-Wound Care: Sween Lotion (Moisturizing lotion) 1 x Per Week/15 Days Discharge Instructions: Apply moisturizing lotion as directed Prim Dressing: Hydrofera Blue Classic Foam, 2x2 in 1 x Per Week/15 Days ary Discharge Instructions: Moisten with saline prior to applying to wound bed Secondary Dressing: Woven Gauze Sponge, Non-Sterile 4x4 in 1 x Per Week/15 Days Discharge Instructions: Apply over primary dressing as directed. Com pression Wrap: FourPress (4 layer compression wrap) 1 x Per Week/15 Days Discharge Instructions: Apply four layer compression as directed. WOUND #6: - Lower Leg Wound Laterality: Left, Posterior Cleanser: Normal Saline 1 x Per Week/15 Days Discharge Instructions: Cleanse the wound with Normal Saline prior to applying a clean dressing using gauze sponges, not tissue or cotton balls. Cleanser: Soap and Water 1 x Per Week/15 Days Discharge Instructions: May shower and wash wound with dial antibacterial soap and water prior to dressing change. Peri-Wound Care: Triamcinolone 15 (g) 1 x Per Week/15 Days Discharge Instructions: Use triamcinolone 15 (g) as directed Peri-Wound Care: Sween Lotion (Moisturizing lotion) 1 x Per Week/15 Days Discharge Instructions: Apply moisturizing lotion as directed Prim Dressing: Hydrofera  Blue Classic Foam, 2x2 in 1 x Per Week/15 Days ary Discharge Instructions: Moisten with saline prior to applying to wound bed Secondary Dressing: Woven Gauze Sponge, Non-Sterile 4x4 in 1 x Per Week/15 Days Discharge Instructions: Apply over primary dressing as directed. Com pression Wrap: FourPress (4 layer compression wrap) 1 x Per Week/15 Days Discharge Instructions: Apply four layer compression as directed. WOUND #7: - Lower Leg Wound Laterality: Right, Medial Cleanser: Normal Saline (DME) (Generic) Every Other Day/7 Days Discharge Instructions: Cleanse the wound with Normal Saline prior to applying a clean dressing using gauze sponges, not tissue or cotton balls. Cleanser: Soap and Water Every Other Day/7 Days Discharge Instructions: May shower and wash wound with dial antibacterial soap and  water prior to dressing change. Prim Dressing: KerraCel Ag Gelling Fiber Dressing, 4x5 in (silver alginate) (DME) (Generic) Every Other Day/7 Days ary Discharge Instructions: Apply silver alginate to wound bed as instructed Secondary Dressing: ComfortFoam Border, 3x3 in (silicone border) (DME) (Generic) Every Other Day/7 Days Discharge Instructions: Apply over primary dressing as directed. 1. I change the primary dressing to the wounds on the left posterior leg to Hydrofera Blue. In general the edema control is a lot better under our compression and the skin looks less threatened 2. He has small open areas on the right upper anterior leg we use silver alginate under foam and his own stocking. 3. We are going to order bilateral juxta lite stockings. However he does not have a credit card he will have to mail a Database administrator) Signed: 05/01/2020 1:32:18 PM By: Bradley Ham MD Entered By: Bradley Stokes on 05/01/2020 13:32:18 -------------------------------------------------------------------------------- SuperBill Details Patient Name: Date of Service: Bradley Stokes  05/01/2020 Medical Record Number: [id number] Patient Account Number: [id number] Date of Birth/Sex: Treating RN: [birth year] (75 y.o. [name], [name] Primary Care Provider: Aretta Stokes Other Clinician: Referring Provider: Treating Provider/Extender: Bradley Stokes in Stokes: 1 Diagnosis Coding ICD-10 Codes Code Description I89.0 Lymphedema, not elsewhere classified L97.828 Non-pressure chronic ulcer of other part of left lower leg with other specified severity Facility Procedures CPT4 Code: [id number] Description: (Facility Use Only) [phone] - [facility] LEG Modifier: Quantity: 1 Physician Procedures : CPT4 Code Description Modifier [id number] - WC PHYS LEVEL 3 - EST PT ICD-10 Diagnosis Description I89.0 Lymphedema, not elsewhere classified L97.828 Non-pressure chronic ulcer of other part of left lower leg with other specified severity Quantity: 1 Electronic Signature(s) Signed: 05/01/2020 4:57:44 PM By: Bradley Ham MD Signed: 05/07/2020 9:09:54 AM By: [name] RN Entered By: [name] on 05/01/2020 13:41:53

## 2020-05-08 ENCOUNTER — Encounter (HOSPITAL_BASED_OUTPATIENT_CLINIC_OR_DEPARTMENT_OTHER): Payer: Medicare HMO | Admitting: Internal Medicine

## 2020-05-08 ENCOUNTER — Other Ambulatory Visit: Payer: Self-pay

## 2020-05-08 DIAGNOSIS — L97222 Non-pressure chronic ulcer of left calf with fat layer exposed: Secondary | ICD-10-CM | POA: Diagnosis not present

## 2020-05-08 DIAGNOSIS — L97819 Non-pressure chronic ulcer of other part of right lower leg with unspecified severity: Secondary | ICD-10-CM | POA: Diagnosis not present

## 2020-05-08 DIAGNOSIS — I890 Lymphedema, not elsewhere classified: Secondary | ICD-10-CM | POA: Diagnosis not present

## 2020-05-08 DIAGNOSIS — L97822 Non-pressure chronic ulcer of other part of left lower leg with fat layer exposed: Secondary | ICD-10-CM | POA: Diagnosis not present

## 2020-05-08 NOTE — Progress Notes (Signed)
EXAVIOR, KIMMONS ([id number]) Visit Report for 05/08/2020 HPI Details Patient Name: Date of Service: Bradley Stokes, Bradley Stokes 05/08/2020 11:00 A M Medical Record Number: [id number] Patient Account Number: [id number] Date of Birth/Sex: Treating RN: [birth year] (75 y.o. [name] Primary Care Provider: [name] Other Clinician: Referring Provider: Treating Provider/Extender: [name] in Treatment: 2 History of Present Illness HPI Description: ADMISSION 04/24/2020 Bradley Stokes is now a 75 year old nondiabetic man. He was here in 2010 and again in 2013 he says the last time for a traumatic wound at work in the setting of chronic venous disease and lymphedema. He has stockings at home but hasn't been wearing them for several weeks now. He says his primary provider Dr. Lyndle Herrlich sent him here for review of severe lymphedema with secondary skin changes including dry cracked hyperkeratotic vericused areas bilaterally. He has been using ammonium lactate lotion to try and soak off some of the dry flaking skin but really hadn't any open wound per se. He has not been using his compression stockings for 2 or 3 weeks he states because he is using the lotion on his legs and he wasn't sure whether to do both. Past medical history includes paroxysmal atrial fibrillation on Coumadin, hypertension, varicose veins and hyperlipidemia, stage III chronic renal failure, hyperlipidemia and probably osteoarthritis 2/1; severe bilateral lymphedema chronic venous insufficiency. Skin changes of lymphedema chronic xerotic skin. He had open areas on the posterior left calf last time. Arrives in clinic with much better edema control. He also has superficial areas on the right anterior were just noticing this week 2/8; the patient has better edema control. He has chronic xerotic skin and skin changes of lymphedema. He has nothing open on the right leg the area on the left is still open but  looking better. Electronic Signature(s) Signed: 05/08/2020 4:37:05 PM By: Linton Ham MD Entered By: Linton Ham on 05/08/2020 12:10:28 -------------------------------------------------------------------------------- Physical Exam Details Patient Name: Date of Service: Bradley Stokes 05/08/2020 11:00 A M Medical Record Number: [id number] Patient Account Number: [id number] Date of Birth/Sex: Treating RN: 10/31/1945 (75 y.o. [name] Primary Care Provider: [name] Other Clinician: Referring Provider: Treating Provider/Extender: [name] in Treatment: 2 Constitutional Patient is hypertensive.. Pulse regular and within target range for patient.Marland Kitchen Respirations regular, non-labored and within target range.. Temperature is normal and within the target range for the patient.Marland Kitchen Appears in no distress. Cardiovascular No pulses are palpable. Edema control is adequate. Integumentary (Hair, Skin) Skin changes of chronic venous insufficiency lymphedema and chronic xerotic skin.. Notes Wound exam; left leg wounds are better still open but healthy looking surfaces. There is nothing open on the right leg. Electronic Signature(s) Signed: 05/08/2020 4:37:05 PM By: Linton Ham MD Entered By: Linton Ham on 05/08/2020 12:13:46 -------------------------------------------------------------------------------- Physician Orders Details Patient Name: Date of Service: Bradley Stokes 05/08/2020 11:00 A M Medical Record Number: [id number] Patient Account Number: [id number] Date of Birth/Sex: Treating RN: 08-05-1945 (75 y.o. [name] Primary Care Provider: [name] Other Clinician: Referring Provider: Treating Provider/Extender: [name] in Treatment: 2 Verbal / Phone Orders: No Diagnosis Coding Follow-up Appointments Return Appointment in 1 week. Bathing/ Shower/ Hygiene May  shower with protection but do not get wound dressing(s) wet. Edema Control - Lymphedema / SCD / Other Elevate legs to the level of the heart or above for 30 minutes daily and/or when sitting, a frequency of: Avoid standing for long periods of  time. Patient to wear own compression stockings every day. - Wear the juxtalite on the right leg. Apply in the morning and remove at bedtime. Moisturize legs daily. - Also continue using the ammonium lactate as prescribed by your PCP Wound Treatment Wound #3 - Lower Leg Wound Laterality: Left, Anterior Cleanser: Soap and Water 1 x Per Week/15 Days Discharge Instructions: May shower and wash wound with dial antibacterial soap and water prior to dressing change. Cleanser: Normal Saline 1 x Per Week/15 Days Discharge Instructions: Cleanse the wound with Normal Saline prior to applying a clean dressing using gauze sponges, not tissue or cotton balls. Peri-Wound Care: Sween Lotion (Moisturizing lotion) 1 x Per Week/15 Days Discharge Instructions: Apply moisturizing lotion as directed Peri-Wound Care: Triamcinolone 15 (g) 1 x Per Week/15 Days Discharge Instructions: Use triamcinolone 15 (g) as directed Prim Dressing: Hydrofera Blue Classic Foam, 2x2 in 1 x Per Week/15 Days ary Discharge Instructions: Moisten with saline prior to applying to wound bed Secondary Dressing: Woven Gauze Sponge, Non-Sterile 4x4 in 1 x Per Week/15 Days Discharge Instructions: Apply over primary dressing as directed. Compression Wrap: FourPress (4 layer compression wrap) 1 x Per Week/15 Days Discharge Instructions: Apply four layer compression as directed. Wound #4 - Lower Leg Wound Laterality: Left, Medial Cleanser: Soap and Water 1 x Per Week/15 Days Discharge Instructions: May shower and wash wound with dial antibacterial soap and water prior to dressing change. Cleanser: Normal Saline 1 x Per Week/15 Days Discharge Instructions: Cleanse the wound with Normal Saline prior to  applying a clean dressing using gauze sponges, not tissue or cotton balls. Peri-Wound Care: Sween Lotion (Moisturizing lotion) 1 x Per Week/15 Days Discharge Instructions: Apply moisturizing lotion as directed Peri-Wound Care: Triamcinolone 15 (g) 1 x Per Week/15 Days Discharge Instructions: Use triamcinolone 15 (g) as directed Prim Dressing: Hydrofera Blue Classic Foam, 2x2 in 1 x Per Week/15 Days ary Discharge Instructions: Moisten with saline prior to applying to wound bed Secondary Dressing: Woven Gauze Sponge, Non-Sterile 4x4 in 1 x Per Week/15 Days Discharge Instructions: Apply over primary dressing as directed. Compression Wrap: FourPress (4 layer compression wrap) 1 x Per Week/15 Days Discharge Instructions: Apply four layer compression as directed. Wound #5 - Lower Leg Wound Laterality: Left, Posterior, Proximal Cleanser: Soap and Water 1 x Per Week/15 Days Discharge Instructions: May shower and wash wound with dial antibacterial soap and water prior to dressing change. Cleanser: Normal Saline 1 x Per Week/15 Days Discharge Instructions: Cleanse the wound with Normal Saline prior to applying a clean dressing using gauze sponges, not tissue or cotton balls. Peri-Wound Care: Sween Lotion (Moisturizing lotion) 1 x Per Week/15 Days Discharge Instructions: Apply moisturizing lotion as directed Peri-Wound Care: Triamcinolone 15 (g) 1 x Per Week/15 Days Discharge Instructions: Use triamcinolone 15 (g) as directed Prim Dressing: Hydrofera Blue Classic Foam, 2x2 in 1 x Per Week/15 Days ary Discharge Instructions: Moisten with saline prior to applying to wound bed Secondary Dressing: Woven Gauze Sponge, Non-Sterile 4x4 in 1 x Per Week/15 Days Discharge Instructions: Apply over primary dressing as directed. Compression Wrap: FourPress (4 layer compression wrap) 1 x Per Week/15 Days Discharge Instructions: Apply four layer compression as directed. Wound #6 - Lower Leg Wound Laterality: Left,  Posterior Cleanser: Soap and Water 1 x Per Week/15 Days Discharge Instructions: May shower and wash wound with dial antibacterial soap and water prior to dressing change. Cleanser: Normal Saline 1 x Per Week/15 Days Discharge Instructions: Cleanse the wound with Normal Saline prior to applying  a clean dressing using gauze sponges, not tissue or cotton balls. Peri-Wound Care: Sween Lotion (Moisturizing lotion) 1 x Per Week/15 Days Discharge Instructions: Apply moisturizing lotion as directed Peri-Wound Care: Triamcinolone 15 (g) 1 x Per Week/15 Days Discharge Instructions: Use triamcinolone 15 (g) as directed Prim Dressing: Hydrofera Blue Classic Foam, 2x2 in 1 x Per Week/15 Days ary Discharge Instructions: Moisten with saline prior to applying to wound bed Secondary Dressing: Woven Gauze Sponge, Non-Sterile 4x4 in 1 x Per Week/15 Days Discharge Instructions: Apply over primary dressing as directed. Compression Wrap: FourPress (4 layer compression wrap) 1 x Per Week/15 Days Discharge Instructions: Apply four layer compression as directed. Compression Stockings: Circaid Juxta Lite Compression Wrap Left Leg Compression Amount: 30-40 mmHG Discharge Instructions: Apply Circaid Juxta Lite Compression Wrap daily as instructed. Apply first thing in the morning, remove at night before bed. Electronic Signature(s) Signed: 05/08/2020 4:37:05 PM By: Linton Ham MD Signed: 05/08/2020 5:05:22 PM By: [name] RN Entered By: [name] on 05/08/2020 12:02:54 -------------------------------------------------------------------------------- Problem List Details Patient Name: Date of Service: Bradley Stokes, Bradley Stokes 05/08/2020 11:00 A M Medical Record Number: [id number] Patient Account Number: [id number] Date of Birth/Sex: Treating RN: 10/03/1945 (75 y.o. Bradley Stokes, [name] Primary Care Provider: [name] Other Clinician: Referring Provider: Treating Provider/Extender: [name] in Treatment: 2 Active Problems ICD-10 Encounter Code Description Active Date MDM Diagnosis I89.0 Lymphedema, not elsewhere classified 04/24/2020 No Yes L97.828 Non-pressure chronic ulcer of other part of left lower leg with other specified 04/24/2020 No Yes severity Inactive Problems ICD-10 Code Description Active Date Inactive Date L97.811 Non-pressure chronic ulcer of other part of right lower leg limited to breakdown of skin 05/01/2020 05/01/2020 Resolved Problems Electronic Signature(s) Signed: 05/08/2020 4:37:05 PM By: Linton Ham MD Entered By: Linton Ham on 05/08/2020 12:09:22 -------------------------------------------------------------------------------- Progress Note Details Patient Name: Date of Service: Bradley Stokes 05/08/2020 11:00 A M Medical Record Number: [id number] Patient Account Number: [id number] Date of Birth/Sex: Treating RN: 01/17/1946 (75 y.o. [name] Primary Care Provider: [name] Other Clinician: Referring Provider: Treating Provider/Extender: [name] in Treatment: 2 Subjective History of Present Illness (HPI) ADMISSION 04/24/2020 Bradley Stokes is now a 75 year old nondiabetic man. He was here in 2010 and again in 2013 he says the last time for a traumatic wound at work in the setting of chronic venous disease and lymphedema. He has stockings at home but hasn't been wearing them for several weeks now. He says his primary provider Dr. Lyndle Herrlich sent him here for review of severe lymphedema with secondary skin changes including dry cracked hyperkeratotic vericused areas bilaterally. He has been using ammonium lactate lotion to try and soak off some of the dry flaking skin but really hadn't any open wound per se. He has not been using his compression stockings for 2 or 3 weeks he states because he is using the lotion on his legs and he wasn't sure whether to do  both. Past medical history includes paroxysmal atrial fibrillation on Coumadin, hypertension, varicose veins and hyperlipidemia, stage III chronic renal failure, hyperlipidemia and probably osteoarthritis 2/1; severe bilateral lymphedema chronic venous insufficiency. Skin changes of lymphedema chronic xerotic skin. He had open areas on the posterior left calf last time. Arrives in clinic with much better edema control. He also has superficial areas on the right anterior were just noticing this week 2/8; the patient has better edema control. He has chronic xerotic skin and skin changes of lymphedema. He  has nothing open on the right leg the area on the left is still open but looking better. Objective Constitutional Patient is hypertensive.. Pulse regular and within target range for patient.Marland Kitchen Respirations regular, non-labored and within target range.. Temperature is normal and within the target range for the patient.Marland Kitchen Appears in no distress. Vitals Time Taken: 11:23 AM, Height: 69 in, Weight: 275 lbs, BMI: 40.6, Temperature: 97.8 F, Pulse: 81 bpm, Respiratory Rate: 18 breaths/min, Blood Pressure: 176/73 mmHg. Cardiovascular No pulses are palpable. Edema control is adequate. General Notes: Wound exam; left leg wounds are better still open but healthy looking surfaces. There is nothing open on the right leg. Integumentary (Hair, Skin) Skin changes of chronic venous insufficiency lymphedema and chronic xerotic skin.. Wound #3 status is Open. Original cause of wound was Gradually Appeared. The wound is located on the Left,Anterior Lower Leg. The wound measures 0.3cm length x 0.3cm width x 0.1cm depth; 0.071cm^2 area and 0.007cm^3 volume. There is Fat Layer (Subcutaneous Tissue) exposed. There is no tunneling or undermining noted. There is a small amount of serosanguineous drainage noted. The wound margin is flat and intact. There is large (67-100%) red granulation within the wound bed. There is no  necrotic tissue within the wound bed. Wound #4 status is Open. Original cause of wound was Gradually Appeared. The wound is located on the Left,Medial Lower Leg. The wound measures 1.3cm length x 1.2cm width x 0.1cm depth; 1.225cm^2 area and 0.123cm^3 volume. There is Fat Layer (Subcutaneous Tissue) exposed. There is no tunneling or undermining noted. There is a medium amount of serous drainage noted. The wound margin is distinct with the outline attached to the wound base. There is large (67-100%) red, pink granulation within the wound bed. There is no necrotic tissue within the wound bed. Wound #5 status is Open. Original cause of wound was Gradually Appeared. The wound is located on the Left,Proximal,Posterior Lower Leg. The wound measures 0.7cm length x 1.1cm width x 0.1cm depth; 0.605cm^2 area and 0.06cm^3 volume. There is Fat Layer (Subcutaneous Tissue) exposed. There is no tunneling or undermining noted. There is a medium amount of serosanguineous drainage noted. The wound margin is distinct with the outline attached to the wound base. There is large (67-100%) red, pink granulation within the wound bed. There is no necrotic tissue within the wound bed. Wound #6 status is Open. Original cause of wound was Gradually Appeared. The wound is located on the Left,Posterior Lower Leg. The wound measures 3.3cm length x 4.5cm width x 0.1cm depth; 11.663cm^2 area and 1.166cm^3 volume. There is Fat Layer (Subcutaneous Tissue) exposed. There is no tunneling or undermining noted. There is a medium amount of serous drainage noted. The wound margin is distinct with the outline attached to the wound base. There is large (67-100%) red, pink granulation within the wound bed. There is a small (1-33%) amount of necrotic tissue within the wound bed including Adherent Slough. Wound #7 status is Healed - Epithelialized. Original cause of wound was Gradually Appeared. The wound is located on the Right,Medial Lower Leg.  The wound measures 0cm length x 0cm width x 0cm depth; 0cm^2 area and 0cm^3 volume. There is no tunneling or undermining noted. There is a none present amount of drainage noted. The wound margin is distinct with the outline attached to the wound base. There is no granulation within the wound bed. There is no necrotic tissue within the wound bed. Assessment Active Problems ICD-10 Lymphedema, not elsewhere classified Non-pressure chronic ulcer of other part of  left lower leg with other specified severity Procedures Wound #3 Pre-procedure diagnosis of Wound #3 is a Lymphedema located on the Left,Anterior Lower Leg . There was a Four Layer Compression Therapy Procedure by [name], RN. Post procedure Diagnosis Wound #3: Same as Pre-Procedure Wound #4 Pre-procedure diagnosis of Wound #4 is a Lymphedema located on the Left,Medial Lower Leg . There was a Four Layer Compression Therapy Procedure by [name], RN. Post procedure Diagnosis Wound #4: Same as Pre-Procedure Wound #5 Pre-procedure diagnosis of Wound #5 is a Lymphedema located on the Left,Proximal,Posterior Lower Leg . There was a Four Layer Compression Therapy Procedure by [name], RN. Post procedure Diagnosis Wound #5: Same as Pre-Procedure Wound #6 Pre-procedure diagnosis of Wound #6 is a Lymphedema located on the Left,Posterior Lower Leg . There was a Four Layer Compression Therapy Procedure by [name], RN. Post procedure Diagnosis Wound #6: Same as Pre-Procedure Plan Follow-up Appointments: Return Appointment in 1 week. Bathing/ Shower/ Hygiene: May shower with protection but do not get wound dressing(s) wet. Edema Control - Lymphedema / SCD / Other: Elevate legs to the level of the heart or above for 30 minutes daily and/or when sitting, a frequency of: Avoid standing for long periods of time. Patient to wear own compression stockings every day. - Wear the juxtalite on the right leg.  Apply in the morning and remove at bedtime. Moisturize legs daily. - Also continue using the ammonium lactate as prescribed by your PCP WOUND #3: - Lower Leg Wound Laterality: Left, Anterior Cleanser: Soap and Water 1 x Per Week/15 Days Discharge Instructions: May shower and wash wound with dial antibacterial soap and water prior to dressing change. Cleanser: Normal Saline 1 x Per Week/15 Days Discharge Instructions: Cleanse the wound with Normal Saline prior to applying a clean dressing using gauze sponges, not tissue or cotton balls. Peri-Wound Care: Sween Lotion (Moisturizing lotion) 1 x Per Week/15 Days Discharge Instructions: Apply moisturizing lotion as directed Peri-Wound Care: Triamcinolone 15 (g) 1 x Per Week/15 Days Discharge Instructions: Use triamcinolone 15 (g) as directed Prim Dressing: Hydrofera Blue Classic Foam, 2x2 in 1 x Per Week/15 Days ary Discharge Instructions: Moisten with saline prior to applying to wound bed Secondary Dressing: Woven Gauze Sponge, Non-Sterile 4x4 in 1 x Per Week/15 Days Discharge Instructions: Apply over primary dressing as directed. Com pression Wrap: FourPress (4 layer compression wrap) 1 x Per Week/15 Days Discharge Instructions: Apply four layer compression as directed. WOUND #4: - Lower Leg Wound Laterality: Left, Medial Cleanser: Soap and Water 1 x Per Week/15 Days Discharge Instructions: May shower and wash wound with dial antibacterial soap and water prior to dressing change. Cleanser: Normal Saline 1 x Per Week/15 Days Discharge Instructions: Cleanse the wound with Normal Saline prior to applying a clean dressing using gauze sponges, not tissue or cotton balls. Peri-Wound Care: Sween Lotion (Moisturizing lotion) 1 x Per Week/15 Days Discharge Instructions: Apply moisturizing lotion as directed Peri-Wound Care: Triamcinolone 15 (g) 1 x Per Week/15 Days Discharge Instructions: Use triamcinolone 15 (g) as directed Prim Dressing: Hydrofera  Blue Classic Foam, 2x2 in 1 x Per Week/15 Days ary Discharge Instructions: Moisten with saline prior to applying to wound bed Secondary Dressing: Woven Gauze Sponge, Non-Sterile 4x4 in 1 x Per Week/15 Days Discharge Instructions: Apply over primary dressing as directed. Com pression Wrap: FourPress (4 layer compression wrap) 1 x Per Week/15 Days Discharge Instructions: Apply four layer compression as directed. WOUND #5: - Lower Leg Wound Laterality: Left, Posterior, Proximal  Cleanser: Soap and Water 1 x Per Week/15 Days Discharge Instructions: May shower and wash wound with dial antibacterial soap and water prior to dressing change. Cleanser: Normal Saline 1 x Per Week/15 Days Discharge Instructions: Cleanse the wound with Normal Saline prior to applying a clean dressing using gauze sponges, not tissue or cotton balls. Peri-Wound Care: Sween Lotion (Moisturizing lotion) 1 x Per Week/15 Days Discharge Instructions: Apply moisturizing lotion as directed Peri-Wound Care: Triamcinolone 15 (g) 1 x Per Week/15 Days Discharge Instructions: Use triamcinolone 15 (g) as directed Prim Dressing: Hydrofera Blue Classic Foam, 2x2 in 1 x Per Week/15 Days ary Discharge Instructions: Moisten with saline prior to applying to wound bed Secondary Dressing: Woven Gauze Sponge, Non-Sterile 4x4 in 1 x Per Week/15 Days Discharge Instructions: Apply over primary dressing as directed. Com pression Wrap: FourPress (4 layer compression wrap) 1 x Per Week/15 Days Discharge Instructions: Apply four layer compression as directed. WOUND #6: - Lower Leg Wound Laterality: Left, Posterior Cleanser: Soap and Water 1 x Per Week/15 Days Discharge Instructions: May shower and wash wound with dial antibacterial soap and water prior to dressing change. Cleanser: Normal Saline 1 x Per Week/15 Days Discharge Instructions: Cleanse the wound with Normal Saline prior to applying a clean dressing using gauze sponges, not tissue or  cotton balls. Peri-Wound Care: Sween Lotion (Moisturizing lotion) 1 x Per Week/15 Days Discharge Instructions: Apply moisturizing lotion as directed Peri-Wound Care: Triamcinolone 15 (g) 1 x Per Week/15 Days Discharge Instructions: Use triamcinolone 15 (g) as directed Prim Dressing: Hydrofera Blue Classic Foam, 2x2 in 1 x Per Week/15 Days ary Discharge Instructions: Moisten with saline prior to applying to wound bed Secondary Dressing: Woven Gauze Sponge, Non-Sterile 4x4 in 1 x Per Week/15 Days Discharge Instructions: Apply over primary dressing as directed. Com pression Wrap: FourPress (4 layer compression wrap) 1 x Per Week/15 Days Discharge Instructions: Apply four layer compression as directed. Com pression Stockings: Circaid Juxta Lite Compression Wrap Compression Amount: 30-40 mmHg (left) Discharge Instructions: Apply Circaid Juxta Lite Compression Wrap daily as instructed. Apply first thing in the morning, remove at night before bed. 1. On the right leg we are simply going to put on his own juxta lite stocking. He is going to use ammonium lactate lotion to the leg nightly 2. On the left leg were continuing with Hydrofera Blue and 4-layer compression. 3. The ammonium lactate is to see if in addition to hydration we can get some gentle exfoliation Electronic Signature(s) Signed: 05/08/2020 4:37:05 PM By: Linton Ham MD Entered By: Linton Ham on 05/08/2020 12:15:26 -------------------------------------------------------------------------------- SuperBill Details Patient Name: Date of Service: Bradley Stokes 05/08/2020 Medical Record Number: [id number] Patient Account Number: [id number] Date of Birth/Sex: Treating RN: 1945-04-19 (75 y.o. Bradley Stokes, [name] Primary Care Provider: [name] Other Clinician: Referring Provider: Treating Provider/Extender: [name] in Treatment: 2 Diagnosis Coding ICD-10 Codes Code  Description I89.0 Lymphedema, not elsewhere classified L97.828 Non-pressure chronic ulcer of other part of left lower leg with other specified severity Physician Procedures : CPT4 Code Description Modifier [id number] - WC PHYS LEVEL 3 - EST PT ICD-10 Diagnosis Description I89.0 Lymphedema, not elsewhere classified L97.828 Non-pressure chronic ulcer of other part of left lower leg with other specified severity Quantity: 1 Electronic Signature(s) Signed: 05/08/2020 4:37:05 PM By: Linton Ham MD Entered By: Linton Ham on 05/08/2020 12:15:45

## 2020-05-09 NOTE — Progress Notes (Signed)
KIWAN, GADSDEN ([id number]) Visit Report for 05/08/2020 Arrival Information Details Patient Name: Date of Service: KAMARE, CASPERS 05/08/2020 11:00 A M Medical Record Number: [id number] Patient Account Number: [id number] Date of Birth/Sex: Treating RN: 1946/03/29 (75 y.o. [name], [name] Primary Care Ruhama Lehew: [name] Other Clinician: Referring Jadine Brumley: Treating Maven Rosander/Extender: [name] in Treatment: 2 Visit Information History Since Last Visit Added or deleted any medications: No Patient Arrived: [name] Any new allergies or adverse reactions: No Arrival Time: 11:22 Had a fall or experienced change in No Accompanied By: self activities of daily living that may affect Transfer Assistance: None risk of falls: Patient Identification Verified: Yes Signs or symptoms of abuse/neglect since last visito No Secondary Verification Process Completed: Yes Hospitalized since last visit: No Patient Requires Transmission-Based Precautions: No Implantable device outside of the clinic excluding No Patient Has Alerts: No cellular tissue based products placed in the center since last visit: Has Dressing in Place as Prescribed: Yes Pain Present Now: No Electronic Signature(s) Signed: 05/09/2020 8:10:33 AM By: [name] Entered By: [name] on 05/08/2020 11:23:16 -------------------------------------------------------------------------------- Compression Therapy Details Patient Name: Date of Service: NIKODEM, LEADBETTER 05/08/2020 11:00 A M Medical Record Number: [id number] Patient Account Number: [id number] Date of Birth/Sex: Treating RN: 04-29-1945 (75 y.o. [name] Primary Care Keean Wilmeth: [name] Other Clinician: Referring Manford Sprong: Treating Sophya Vanblarcom/Extender: [name] in Treatment: 2 Compression Therapy Performed for Wound Assessment: Wound #3 Left,Anterior Lower  Leg Performed By: Clinician [name], RN Compression Type: Four Layer Post Procedure Diagnosis Same as Pre-procedure Electronic Signature(s) Signed: 05/08/2020 5:05:22 PM By: [name] RN Entered By: [name] on 05/08/2020 12:04:00 -------------------------------------------------------------------------------- Compression Therapy Details Patient Name: Date of Service: [name] 05/08/2020 11:00 A M Medical Record Number: [id number] Patient Account Number: [id number] Date of Birth/Sex: Treating RN: 01/26/1946 (75 y.o. [name] Primary Care Philomena Buttermore: [name] Other Clinician: Referring Jacquita Mulhearn: Treating Kaytelyn Glore/Extender: [name] in Treatment: 2 Compression Therapy Performed for Wound Assessment: Wound #4 Left,Medial Lower Leg Performed By: Clinician [name], RN Compression Type: Four Layer Post Procedure Diagnosis Same as Pre-procedure Electronic Signature(s) Signed: 05/08/2020 5:05:22 PM By: [name] RN Entered By: [name] on 05/08/2020 12:04:00 -------------------------------------------------------------------------------- Compression Therapy Details Patient Name: Date of Service: [name] 05/08/2020 11:00 A M Medical Record Number: [id number] Patient Account Number: [id number] Date of Birth/Sex: Treating RN: 1945-12-18 (75 y.o. [name] Primary Care Cori Justus: [name] Other Clinician: Referring Taft Worthing: Treating Teryn Boerema/Extender: [name] in Treatment: 2 Compression Therapy Performed for Wound Assessment: Wound #5 Left,Proximal,Posterior Lower Leg Performed By: Clinician [name], RN Compression Type: Four Layer Post Procedure Diagnosis Same as Pre-procedure Electronic Signature(s) Signed: 05/08/2020 5:05:22 PM By: [name] RN Entered By: [name] on 05/08/2020  12:04:00 -------------------------------------------------------------------------------- Compression Therapy Details Patient Name: Date of Service: [name] 05/08/2020 11:00 A M Medical Record Number: [id number] Patient Account Number: [id number] Date of Birth/Sex: Treating RN: Jul 19, 1945 (75 y.o. [name] Primary Care Pegi Milazzo: [name] Other Clinician: Referring Salle Brandle: Treating Corleen Otwell/Extender: [name] in Treatment: 2 Compression Therapy Performed for Wound Assessment: Wound #6 Left,Posterior Lower Leg Performed By: Clinician [name], RN Compression Type: Four Layer Post Procedure Diagnosis Same as Pre-procedure Electronic Signature(s) Signed: 05/08/2020 5:05:22 PM By: [name] RN Signed: 05/08/2020 5:05:22 PM By: [name] RN Entered By: [name] on 05/08/2020 12:04:00 --------------------------------------------------------------------------------  Encounter Discharge Information Details Patient Name: Date of Service: TYMOTHY, CASS 05/08/2020 11:00 A M Medical Record Number: [id number] Patient Account Number: [id number] Date of Birth/Sex: Treating RN: January 09, 1946 (75 y.o. [name] Primary Care Abbegale Stehle: [name] Other Clinician: Referring Seba Madole: Treating Genetta Fiero/Extender: [name] in Treatment: 2 Encounter Discharge Information Items Discharge Condition: Stable Ambulatory Status: Cane Discharge Destination: Home Transportation: Private Auto Accompanied By: alone Schedule Follow-up Appointment: Yes Clinical Summary of Care: Patient Declined Electronic Signature(s) Signed: 05/08/2020 5:04:56 PM By: Levan Hurst RN, BSN Entered By: Levan Hurst on 05/08/2020 14:48:10 -------------------------------------------------------------------------------- Lower Extremity Assessment Details Patient Name: Date of  Service: [name] 05/08/2020 11:00 A M Medical Record Number: [id number] Patient Account Number: [id number] Date of Birth/Sex: Treating RN: February 18, 1946 (75 y.o. [name] Primary Care Lional Icenogle: Milagros Evener R Other Clinician: Referring Orvin Netter: Treating Castor Gittleman/Extender: [name] in Treatment: 2 Edema Assessment Assessed: [Left: No] [Right: No] Edema: [Left: Yes] [Right: Yes] Calf Left: Right: Point of Measurement: 34 cm From Medial Instep 40 cm 42 cm Ankle Left: Right: Point of Measurement: 11 cm From Medial Instep 26 cm 24 cm Vascular Assessment Pulses: Dorsalis Pedis Palpable: [Left:Yes] [Right:Yes] Electronic Signature(s) Signed: 05/08/2020 5:13:50 PM By: [name] RN, BSN Entered By: [name] on 05/08/2020 11:46:11 -------------------------------------------------------------------------------- Multi Wound Chart Details Patient Name: Date of Service: [name] 05/08/2020 11:00 A M Medical Record Number: [id number] Patient Account Number: [id number] Date of Birth/Sex: Treating RN: 1945-12-16 (75 y.o. [name], [name] Primary Care Cipriana Biller: [name] Other Clinician: Referring Chancey Ringel: Treating Frances Ambrosino/Extender: [name] in Treatment: 2 Vital Signs Height(in): 69 Pulse(bpm): 6 Weight(lbs): 275 Blood Pressure(mmHg): 176/73 Body Mass Index(BMI): 41 Temperature(F): 97.8 Respiratory Rate(breaths/min): 18 Photos: [3:No Photos Left, Anterior Lower Leg] [4:No Photos Left, Medial Lower Leg] [5:No Photos Left, Proximal, Posterior Lower Leg] Wound Location: [3:Gradually Appeared] [4:Gradually Appeared] [5:Gradually Appeared] Wounding Event: [3:Lymphedema] [4:Lymphedema] [5:Lymphedema] Primary Etiology: [3:Cataracts, Lymphedema, Arrhythmia, Cataracts, Lymphedema, Arrhythmia, Cataracts, Lymphedema, Arrhythmia,] Comorbid History: [3:Hypertension,  Myocardial Infarction, Hypertension, Myocardial Infarction, Hypertension, Myocardial Infarction, Peripheral Venous Disease, Gout, Osteoarthritis 04/29/2020] [4:Peripheral Venous Disease, Gout, Osteoarthritis 04/29/2020]  [5:Peripheral Venous Disease, Gout, Osteoarthritis 04/29/2020] Date Acquired: [3:1] [4:1] [5:1] Weeks of Treatment: [3:Open] [4:Open] [5:Open] Wound Status: [3:No] [4:Yes] [5:No] Clustered Wound: [3:N/A] [4:2] [5:N/A] Clustered Quantity: [3:0.3x0.3x0.1] [4:1.3x1.2x0.1] [5:0.7x1.1x0.1] Measurements L x W x D (cm) [3:0.071] [4:1.225] [5:0.605] A (cm) : rea [3:0.007] [4:0.123] [5:0.06] Volume (cm) : [3:91.00%] [4:80.50%] [5:74.30%] % Reduction in Area: [3:91.10%] [4:80.40%] [5:74.60%] % Reduction in Volume: [3:Full Thickness Without Exposed] [4:Full Thickness Without Exposed] [5:Full Thickness Without Exposed] Classification: [3:Support Structures Small] [4:Support Structures Medium] [5:Support Structures Medium] Exudate Amount: [3:Serosanguineous] [4:Serous] [5:Serosanguineous] Exudate Type: [3:red, brown] [4:amber] [5:red, brown] Exudate Color: [3:Flat and Intact] [4:Distinct, outline attached] [5:Distinct, outline attached] Wound Margin: [3:Large (67-100%)] [4:Large (67-100%)] [5:Large (67-100%)] Granulation Amount: [3:Red] [4:Red, Pink] [5:Red, Pink] Granulation Quality: [3:None Present (0%)] [4:None Present (0%)] [5:None Present (0%)] Necrotic Amount: [3:Fat Layer (Subcutaneous Tissue): Yes Fat Layer (Subcutaneous Tissue): Yes Fat Layer (Subcutaneous Tissue): Yes] Exposed Structures: [3:Fascia: No Tendon: No Muscle: No Joint: No Bone: No Small (1-33%)] [4:Fascia: No Tendon: No Muscle: No Joint: No Bone: No Small (1-33%)] [5:Fascia: No Tendon: No Muscle: No Joint: No Bone: No Small (1-33%)] Epithelialization: [3:Compression Therapy] [4:Compression Therapy] [5:Compression Lewis Wound Number: 6 7 N/A Photos: No Photos No Photos N/A Left, Posterior Lower Leg Right, Medial  Lower Leg N/A Wound Location: Gradually Appeared Gradually  Appeared N/A Wounding Event: Lymphedema Lymphedema N/A Primary Etiology: Cataracts, Lymphedema, Arrhythmia, Cataracts, Lymphedema, Arrhythmia, N/A Comorbid History: Hypertension, Myocardial Infarction, Hypertension, Myocardial Infarction, Peripheral Venous Disease, Gout, Peripheral Venous Disease, Gout, Osteoarthritis Osteoarthritis 04/29/2020 04/29/2020 N/A Date Acquired: 1 1 N/A Weeks of Treatment: Open Healed - Epithelialized N/A Wound Status: No No N/A Clustered Wound: N/A N/A N/A Clustered Quantity: 3.3x4.5x0.1 0x0x0 N/A Measurements L x W x D (cm) 11.663 0 N/A A (cm) : rea 1.166 0 N/A Volume (cm) : 50.50% 100.00% N/A % Reduction in Area: 50.50% 100.00% N/A % Reduction in Volume: Full Thickness Without Exposed Full Thickness Without Exposed N/A Classification: Support Structures Support Structures Medium None Present N/A Exudate Amount: Serous N/A N/A Exudate Type: amber N/A N/A Exudate Color: Distinct, outline attached Distinct, outline attached N/A Wound Margin: Large (67-100%) None Present (0%) N/A Granulation Amount: Red, Pink N/A N/A Granulation Quality: Small (1-33%) None Present (0%) N/A Necrotic Amount: Fat Layer (Subcutaneous Tissue): Yes Fascia: No N/A Exposed Structures: Fascia: No Fat Layer (Subcutaneous Tissue): No Tendon: No Tendon: No Muscle: No Muscle: No Joint: No Joint: No Bone: No Bone: No Small (1-33%) Large (67-100%) N/A Epithelialization: Compression Therapy N/A N/A Procedures Performed: Treatment Notes Electronic Signature(s) Signed: 05/08/2020 4:37:05 PM By: Linton Ham MD Signed: 05/08/2020 5:05:22 PM By: [name] RN Entered By: Linton Ham on 05/08/2020 12:09:50 -------------------------------------------------------------------------------- Multi-Disciplinary Care Plan Details Patient Name: Date of Service: [name]. 05/08/2020 11:00 A  M Medical Record Number: [id number] Patient Account Number: [id number] Date of Birth/Sex: Treating RN: 05-08-1945 (75 y.o. [name], [name] Primary Care Georgie Eduardo: [name] Other Clinician: Referring Emiya Loomer: Treating Jeramiah Mccaughey/Extender: [name] in Treatment: 2 Active Inactive Orientation to the Wound Care Program Nursing Diagnoses: Knowledge deficit related to the wound healing center program Goals: Patient/caregiver will verbalize understanding of the [place] Date Initiated: 04/24/2020 Target Resolution Date: 06/01/2020 Goal Status: Active Interventions: Provide education on orientation to the wound center Notes: Wound/Skin Impairment Nursing Diagnoses: Knowledge deficit related to ulceration/compromised skin integrity Goals: Patient will have a decrease in wound volume by X% from date: (specify in notes) Date Initiated: 04/24/2020 Target Resolution Date: 06/01/2020 Goal Status: Active Patient/caregiver will verbalize understanding of skin care regimen Date Initiated: 04/24/2020 Target Resolution Date: 06/01/2020 Goal Status: Active Ulcer/skin breakdown will have a volume reduction of 30% by week 4 Date Initiated: 04/24/2020 Target Resolution Date: 06/01/2020 Goal Status: Active Interventions: Assess patient/caregiver ability to obtain necessary supplies Assess patient/caregiver ability to perform ulcer/skin care regimen upon admission and as needed Assess ulceration(s) every visit Notes: Electronic Signature(s) Signed: 05/08/2020 5:05:22 PM By: [name] RN Entered By: [name] on 05/08/2020 12:03:24 -------------------------------------------------------------------------------- Pain Assessment Details Patient Name: Date of Service: [name] 05/08/2020 11:00 A M Medical Record Number: [id number] Patient Account Number: [id number] Date of Birth/Sex: Treating RN: 03-Jun-1945 (75 y.o. [name], [name] Primary Care Brayan Votaw: [name] Other Clinician: Referring Bryley Kovacevic: Treating Jalia Zuniga/Extender: [name] in Treatment: 2 Active Problems Location of Pain Severity and Description of Pain Patient Has Paino No Site Locations Pain Management and Medication Current Pain Management: Electronic Signature(s) Signed: 05/08/2020 5:05:22 PM By: [name] RN Signed: 05/09/2020 8:10:33 AM By: [name] Entered By: [name] on 05/08/2020 11:24:38 -------------------------------------------------------------------------------- Patient/Caregiver Education Details Patient Name: Date of Service: Forster, Oakes E. 2/8/2022andnbsp11:00 [place] Record Number: [id number] Patient Account Number: [id number] Date of Birth/Gender: Treating RN: [birth year] (75 y.o. [name] Primary Care  Physician: [name] Other Clinician: Referring Physician: Treating Physician/Extender: [name] in Treatment: 2 Education Assessment Education Provided To: Patient Education Topics Provided Welcome T [facility]: o Methods: Explain/Verbal Responses: State content correctly Electronic Signature(s) Signed: 05/08/2020 5:05:22 PM By: [name] RN Entered By: [name] on 05/08/2020 12:03:35 -------------------------------------------------------------------------------- Wound Assessment Details Patient Name: Date of Service: [name] 05/08/2020 11:00 A M Medical Record Number: [id number] Patient Account Number: [id number] Date of Birth/Sex: Treating RN: 09-05-1945 (75 y.o. [name], [name] Primary Care Neli Fofana: [name] Other Clinician: Referring Mckaila Duffus: Treating Zyonna Vardaman/Extender: [name] in Treatment: 2 Wound Status Wound Number: 3 Primary Lymphedema Etiology: Wound Location: Left,  Anterior Lower Leg Wound Open Wounding Event: Gradually Appeared Status: Date Acquired: 04/29/2020 Comorbid Cataracts, Lymphedema, Arrhythmia, Hypertension, Myocardial Weeks Of Treatment: 1 History: Infarction, Peripheral Venous Disease, Gout, Osteoarthritis Clustered Wound: No Wound Measurements Length: (cm) 0.3 Width: (cm) 0.3 Depth: (cm) 0.1 Area: (cm) 0.071 Volume: (cm) 0.007 % Reduction in Area: 91% % Reduction in Volume: 91.1% Epithelialization: Small (1-33%) Tunneling: No Undermining: No Wound Description Classification: Full Thickness Without Exposed Support Structures Wound Margin: Flat and Intact Exudate Amount: Small Exudate Type: Serosanguineous Exudate Color: red, brown Foul Odor After Cleansing: No Slough/Fibrino No Wound Bed Granulation Amount: Large (67-100%) Exposed Structure Granulation Quality: Red Fascia Exposed: No Necrotic Amount: None Present (0%) Fat Layer (Subcutaneous Tissue) Exposed: Yes Tendon Exposed: No Muscle Exposed: No Joint Exposed: No Bone Exposed: No Treatment Notes Wound #3 (Lower Leg) Wound Laterality: Left, Anterior Cleanser Soap and Water Discharge Instruction: May shower and wash wound with dial antibacterial soap and water prior to dressing change. Normal Saline Discharge Instruction: Cleanse the wound with Normal Saline prior to applying a clean dressing using gauze sponges, not tissue or cotton balls. Peri-Wound Care Sween Lotion (Moisturizing lotion) Discharge Instruction: Apply moisturizing lotion as directed Triamcinolone 15 (g) Discharge Instruction: Use triamcinolone 15 (g) as directed Topical Primary Dressing Hydrofera Blue Classic Foam, 2x2 in Discharge Instruction: Moisten with saline prior to applying to wound bed Secondary Dressing Woven Gauze Sponge, Non-Sterile 4x4 in Discharge Instruction: Apply over primary dressing as directed. Secured With Compression Wrap FourPress (4 layer compression  wrap) Discharge Instruction: Apply four layer compression as directed. Compression Stockings Add-Ons Electronic Signature(s) Signed: 05/08/2020 5:05:22 PM By: [name] RN Signed: 05/08/2020 5:13:50 PM By: [name] RN, BSN Entered By: [name] on 05/08/2020 11:51:31 -------------------------------------------------------------------------------- Wound Assessment Details Patient Name: Date of Service: [name] 05/08/2020 11:00 A M Medical Record Number: [id number] Patient Account Number: [id number] Date of Birth/Sex: Treating RN: Jun 10, 1945 (75 y.o. [name], [name] Primary Care Meghen Akopyan: [name] Other Clinician: Referring Loyalty Brashier: Treating Azalya Galyon/Extender: [name] in Treatment: 2 Wound Status Wound Number: 4 Primary Lymphedema Etiology: Wound Location: Left, Medial Lower Leg Wound Open Wounding Event: Gradually Appeared Status: Date Acquired: 04/29/2020 Comorbid Cataracts, Lymphedema, Arrhythmia, Hypertension, Myocardial Weeks Of Treatment: 1 History: Infarction, Peripheral Venous Disease, Gout, Osteoarthritis Clustered Wound: Yes Wound Measurements Length: (cm) 1.3 Width: (cm) 1.2 Depth: (cm) 0.1 Clustered Quantity: 2 Area: (cm) 1.225 Volume: (cm) 0.123 % Reduction in Area: 80.5% % Reduction in Volume: 80.4% Epithelialization: Small (1-33%) Tunneling: No Undermining: No Wound Description Classification: Full Thickness Without Exposed Support Structures Wound Margin: Distinct, outline attached Exudate Amount: Medium Exudate Type: Serous Exudate Color: amber Wound Bed Granulation Amount: Large (67-100%) Granulation Quality: Red, Pink Necrotic Amount: None Present (0%) Foul Odor  After Cleansing: No Slough/Fibrino Yes Exposed Structure Fascia Exposed: No Fat Layer (Subcutaneous Tissue) Exposed: Yes Tendon Exposed: No Muscle Exposed: No Joint Exposed: No Bone Exposed:  No Treatment Notes Wound #4 (Lower Leg) Wound Laterality: Left, Medial Cleanser Soap and Water Discharge Instruction: May shower and wash wound with dial antibacterial soap and water prior to dressing change. Normal Saline Discharge Instruction: Cleanse the wound with Normal Saline prior to applying a clean dressing using gauze sponges, not tissue or cotton balls. Peri-Wound Care Sween Lotion (Moisturizing lotion) Discharge Instruction: Apply moisturizing lotion as directed Triamcinolone 15 (g) Discharge Instruction: Use triamcinolone 15 (g) as directed Topical Primary Dressing Hydrofera Blue Classic Foam, 2x2 in Discharge Instruction: Moisten with saline prior to applying to wound bed Secondary Dressing Woven Gauze Sponge, Non-Sterile 4x4 in Discharge Instruction: Apply over primary dressing as directed. Secured With Compression Wrap FourPress (4 layer compression wrap) Discharge Instruction: Apply four layer compression as directed. Compression Stockings Add-Ons Electronic Signature(s) Signed: 05/08/2020 5:05:22 PM By: [name] RN Signed: 05/08/2020 5:13:50 PM By: [name] RN, BSN Entered By: [name] on 05/08/2020 11:51:57 -------------------------------------------------------------------------------- Wound Assessment Details Patient Name: Date of Service: [name] 05/08/2020 11:00 A M Medical Record Number: [id number] Patient Account Number: [id number] Date of Birth/Sex: Treating RN: 02-01-1946 (75 y.o. [name], [name] Primary Care Shamiracle Gorden: [name] Other Clinician: Referring Egidio Lofgren: Treating Brigett Estell/Extender: [name] in Treatment: 2 Wound Status Wound Number: 5 Primary Lymphedema Etiology: Wound Location: Left, Proximal, Posterior Lower Leg Wound Open Wounding Event: Gradually Appeared Status: Date Acquired: 04/29/2020 Comorbid Cataracts, Lymphedema, Arrhythmia, Hypertension,  Myocardial Weeks Of Treatment: 1 History: Infarction, Peripheral Venous Disease, Gout, Osteoarthritis Clustered Wound: No Wound Measurements Length: (cm) 0.7 Width: (cm) 1.1 Depth: (cm) 0.1 Area: (cm) 0.605 Volume: (cm) 0.06 % Reduction in Area: 74.3% % Reduction in Volume: 74.6% Epithelialization: Small (1-33%) Tunneling: No Undermining: No Wound Description Classification: Full Thickness Without Exposed Support Structures Wound Margin: Distinct, outline attached Exudate Amount: Medium Exudate Type: Serosanguineous Exudate Color: red, brown Foul Odor After Cleansing: No Slough/Fibrino Yes Wound Bed Granulation Amount: Large (67-100%) Exposed Structure Granulation Quality: Red, Pink Fascia Exposed: No Necrotic Amount: None Present (0%) Fat Layer (Subcutaneous Tissue) Exposed: Yes Tendon Exposed: No Muscle Exposed: No Joint Exposed: No Bone Exposed: No Treatment Notes Wound #5 (Lower Leg) Wound Laterality: Left, Posterior, Proximal Cleanser Soap and Water Discharge Instruction: May shower and wash wound with dial antibacterial soap and water prior to dressing change. Normal Saline Discharge Instruction: Cleanse the wound with Normal Saline prior to applying a clean dressing using gauze sponges, not tissue or cotton balls. Peri-Wound Care Sween Lotion (Moisturizing lotion) Discharge Instruction: Apply moisturizing lotion as directed Triamcinolone 15 (g) Discharge Instruction: Use triamcinolone 15 (g) as directed Topical Primary Dressing Hydrofera Blue Classic Foam, 2x2 in Discharge Instruction: Moisten with saline prior to applying to wound bed Secondary Dressing Woven Gauze Sponge, Non-Sterile 4x4 in Discharge Instruction: Apply over primary dressing as directed. Secured With Compression Wrap FourPress (4 layer compression wrap) Discharge Instruction: Apply four layer compression as directed. Compression Stockings Add-Ons Electronic Signature(s) Signed:  05/08/2020 5:05:22 PM By: [name] RN Signed: 05/08/2020 5:13:50 PM By: [name] RN, BSN Entered By: [name] on 05/08/2020 11:52:30 -------------------------------------------------------------------------------- Wound Assessment Details Patient Name: Date of Service: [name] 05/08/2020 11:00 A M Medical Record Number: [id number] Patient Account Number: [id number] Date of Birth/Sex: Treating RN: 1946/03/13 (75 y.o. [name] Primary Care Teofilo Lupinacci: [place], [place]  Clinician: Referring Quashawn Jewkes: Treating Haleigh Desmith/Extender: Rodell Perna, [name] Weeks in Treatment: 2 Wound Status Wound Number: 6 Primary Lymphedema Etiology: Wound Location: Left, Posterior Lower Leg Wound Open Wounding Event: Gradually Appeared Status: Date Acquired: 04/29/2020 Comorbid Cataracts, Lymphedema, Arrhythmia, Hypertension, Myocardial Weeks Of Treatment: 1 History: Infarction, Peripheral Venous Disease, Gout, Osteoarthritis Clustered Wound: No Wound Measurements Length: (cm) 3.3 Width: (cm) 4.5 Depth: (cm) 0.1 Area: (cm) 11.663 Volume: (cm) 1.166 % Reduction in Area: 50.5% % Reduction in Volume: 50.5% Epithelialization: Small (1-33%) Tunneling: No Undermining: No Wound Description Classification: Full Thickness Without Exposed Support Structures Wound Margin: Distinct, outline attached Exudate Amount: Medium Exudate Type: Serous Exudate Color: amber Foul Odor After Cleansing: No Slough/Fibrino Yes Wound Bed Granulation Amount: Large (67-100%) Exposed Structure Granulation Quality: Red, Pink Fascia Exposed: No Necrotic Amount: Small (1-33%) Fat Layer (Subcutaneous Tissue) Exposed: Yes Necrotic Quality: Adherent Slough Tendon Exposed: No Muscle Exposed: No Joint Exposed: No Bone Exposed: No Treatment Notes Wound #6 (Lower Leg) Wound Laterality: Left, Posterior Cleanser Soap and Water Discharge Instruction: May shower and  wash wound with dial antibacterial soap and water prior to dressing change. Normal Saline Discharge Instruction: Cleanse the wound with Normal Saline prior to applying a clean dressing using gauze sponges, not tissue or cotton balls. Peri-Wound Care Sween Lotion (Moisturizing lotion) Discharge Instruction: Apply moisturizing lotion as directed Triamcinolone 15 (g) Discharge Instruction: Use triamcinolone 15 (g) as directed Topical Primary Dressing Hydrofera Blue Classic Foam, 2x2 in Discharge Instruction: Moisten with saline prior to applying to wound bed Secondary Dressing Woven Gauze Sponge, Non-Sterile 4x4 in Discharge Instruction: Apply over primary dressing as directed. Secured With Compression Wrap FourPress (4 layer compression wrap) Discharge Instruction: Apply four layer compression as directed. Compression Stockings Circaid Juxta Lite Compression Wrap Quantity: 1 Left Leg Compression Amount: 30-40 mmHg Discharge Instruction: Apply Circaid Juxta Lite Compression Wrap daily as instructed. Apply first thing in the morning, remove at night before bed. Add-Ons Electronic Signature(s) Signed: 05/08/2020 5:05:22 PM By: [name] RN Signed: 05/08/2020 5:13:50 PM By: [name] RN, BSN Entered By: [name] on 05/08/2020 11:52:50 -------------------------------------------------------------------------------- Wound Assessment Details Patient Name: Date of Service: [name] 05/08/2020 11:00 A M Medical Record Number: [id number] Patient Account Number: [id number] Date of Birth/Sex: Treating RN: 10/08/1945 (75 y.o. [name], [name] Primary Care Kaylum Shrum: [name] Other Clinician: Referring Kacen Mellinger: Treating Inola Lisle/Extender: [name] in Treatment: 2 Wound Status Wound Number: 7 Primary Lymphedema Etiology: Wound Location: Right, Medial Lower Leg Wound Healed - Epithelialized Wounding Event: Gradually  Appeared Status: Date Acquired: 04/29/2020 Comorbid Cataracts, Lymphedema, Arrhythmia, Hypertension, Myocardial Weeks Of Treatment: 1 History: Infarction, Peripheral Venous Disease, Gout, Osteoarthritis Clustered Wound: No Wound Measurements Length: (cm) Width: (cm) Depth: (cm) Area: (cm) Volume: (cm) 0 % Reduction in Area: 100% 0 % Reduction in Volume: 100% 0 Epithelialization: Large (67-100%) 0 Tunneling: No 0 Undermining: No Wound Description Classification: Full Thickness Without Exposed Support Structures Wound Margin: Distinct, outline attached Exudate Amount: None Present Foul Odor After Cleansing: No Slough/Fibrino No Wound Bed Granulation Amount: None Present (0%) Exposed Structure Necrotic Amount: None Present (0%) Fascia Exposed: No Fat Layer (Subcutaneous Tissue) Exposed: No Tendon Exposed: No Muscle Exposed: No Joint Exposed: No Bone Exposed: No Electronic Signature(s) Signed: 05/08/2020 5:05:22 PM By: [name] RN Signed: 05/08/2020 5:13:50 PM By: [name] RN, BSN Entered By: [name] on 05/08/2020 11:53:30 -------------------------------------------------------------------------------- [place] Details Patient Name: Date of Service: Elmore Guise E. 05/08/2020 11:00 A M Medical Record  Number: [id number] Patient Account Number: [id number] Date of Birth/Sex: Treating RN: 08/07/1945 (75 y.o. [name], [name] Primary Care Kona Lover: Other Clinician: [name] Referring Tzivia Oneil: Treating Faiza Bansal/Extender: [name] in Treatment: 2 Vital Signs Time Taken: 11:23 Temperature (F): 97.8 Height (in): 69 Pulse (bpm): 81 Weight (lbs): 275 Respiratory Rate (breaths/min): 18 Body Mass Index (BMI): 40.6 Blood Pressure (mmHg): 176/73 Reference Range: 80 - 120 mg / dl Electronic Signature(s) Signed: 05/09/2020 8:10:33 AM By: [name] Entered By: [name] on 05/08/2020 11:24:32

## 2020-05-15 ENCOUNTER — Other Ambulatory Visit: Payer: Self-pay

## 2020-05-15 ENCOUNTER — Encounter (HOSPITAL_BASED_OUTPATIENT_CLINIC_OR_DEPARTMENT_OTHER): Payer: Medicare HMO | Admitting: Internal Medicine

## 2020-05-15 DIAGNOSIS — I890 Lymphedema, not elsewhere classified: Secondary | ICD-10-CM | POA: Diagnosis not present

## 2020-05-15 DIAGNOSIS — L97822 Non-pressure chronic ulcer of other part of left lower leg with fat layer exposed: Secondary | ICD-10-CM | POA: Diagnosis not present

## 2020-05-15 DIAGNOSIS — L97828 Non-pressure chronic ulcer of other part of left lower leg with other specified severity: Secondary | ICD-10-CM | POA: Diagnosis not present

## 2020-05-15 DIAGNOSIS — L97222 Non-pressure chronic ulcer of left calf with fat layer exposed: Secondary | ICD-10-CM | POA: Diagnosis not present

## 2020-05-18 NOTE — Progress Notes (Signed)
Bradley Stokes ([id number]) Visit Report for 05/15/2020 Arrival Information Details Patient Name: Date of Service: Bradley Stokes 05/15/2020 1:00 PM Medical Record Number: [id number] Patient Account Number: [id number] Date of Birth/Sex: Treating RN: June 14, 1945 (75 y.o. Bradley Stokes Primary Care Bradley Stokes: Bradley Stokes Other Clinician: Referring Bradley Stokes: Treating Bradley Stokes/Extender: Bradley Stokes in Treatment: 3 Visit Information History Since Last Visit Added or deleted any medications: No Patient Arrived: Bradley Stokes Any new allergies or adverse reactions: No Arrival Time: 13:03 Had a fall or experienced change in No Accompanied By: self activities of daily living that may affect Transfer Assistance: None risk of falls: Patient Identification Verified: Yes Signs or symptoms of abuse/neglect since last visito No Secondary Verification Process Completed: Yes Hospitalized since last visit: No Patient Requires Transmission-Based Precautions: No Has Dressing in Place as Prescribed: Yes Patient Has Alerts: No Has Compression in Place as Prescribed: Yes Pain Present Now: Yes Electronic Signature(s) Signed: 05/15/2020 5:47:53 PM By: Bradley Gouty RN, BSN Entered By: Bradley Stokes on 05/15/2020 13:06:43 -------------------------------------------------------------------------------- Compression Therapy Details Patient Name: Date of Service: Bradley Stokes 05/15/2020 1:00 PM Medical Record Number: [id number] Patient Account Number: [id number] Date of Birth/Sex: Treating RN: 1945/07/29 (75 y.o. Bradley Stokes Primary Care Leighanne Adolph: Bradley Stokes Other Clinician: Referring Graviel Payeur: Treating Daaiyah Baumert/Extender: Bradley Stokes Weeks in Treatment: 3 Compression Therapy Performed for Wound Assessment: Wound #4 Left,Medial Lower Leg Performed By: Clinician Bradley Hammock, RN Compression Type: Four Layer Post  Procedure Diagnosis Same as Pre-procedure Electronic Signature(s) Signed: 05/18/2020 5:09:47 PM By: Bradley Hammock RN Entered By: Bradley Stokes on 05/15/2020 13:35:37 -------------------------------------------------------------------------------- Compression Therapy Details Patient Name: Date of Service: Bradley Stokes, Bradley Stokes 05/15/2020 1:00 PM Medical Record Number: [id number] Patient Account Number: [id number] Date of Birth/Sex: Treating RN: March 19, 1946 (75 y.o. Bradley Stokes Primary Care Keenen Roessner: Bradley Stokes Other Clinician: Referring Lycan Davee: Treating Bradley Stokes/Extender: Bradley Stokes Weeks in Treatment: 3 Compression Therapy Performed for Wound Assessment: Wound #5 Left,Proximal,Posterior Lower Leg Performed By: Clinician Bradley Hammock, RN Compression Type: Four Layer Post Procedure Diagnosis Same as Pre-procedure Electronic Signature(s) Signed: 05/18/2020 5:09:47 PM By: Bradley Hammock RN Entered By: Bradley Stokes on 05/15/2020 13:35:37 -------------------------------------------------------------------------------- Compression Therapy Details Patient Name: Date of Service: Bradley Stokes, Bradley Stokes 05/15/2020 1:00 PM Medical Record Number: [id number] Patient Account Number: [id number] Date of Birth/Sex: Treating RN: 1945/04/22 (75 y.o. Bradley Stokes Primary Care Bradley Stokes: Bradley Stokes Other Clinician: Referring Bradley Stokes: Treating Bradley Stokes/Extender: Bradley Stokes Weeks in Treatment: 3 Compression Therapy Performed for Wound Assessment: Wound #6 Left,Posterior Lower Leg Performed By: Clinician Bradley Hammock, RN Compression Type: Four Layer Post Procedure Diagnosis Same as Pre-procedure Electronic Signature(s) Signed: 05/18/2020 5:09:47 PM By: Bradley Hammock RN Entered By: Bradley Stokes on 05/15/2020  13:35:38 -------------------------------------------------------------------------------- Encounter Discharge Information Details Patient Name: Date of Service: Bradley Stokes 05/15/2020 1:00 PM Medical Record Number: [id number] Patient Account Number: [id number] Date of Birth/Sex: Treating RN: 1945/07/05 (75 y.o. Bradley Stokes Primary Care Bradley Stokes: Bradley Stokes Other Clinician: Referring Demeka Sutter: Treating Bradley Stokes/Extender: Bradley Stokes in Treatment: 3 Encounter Discharge Information Items Discharge Condition: Stable Ambulatory Status: Cane Discharge Destination: Home Transportation: Private Auto Accompanied By: alone Schedule Follow-up Appointment: Yes Clinical Summary of Care: Patient Declined Electronic Signature(s) Signed: 05/15/2020 5:45:50 PM By: Bradley Hurst RN, BSN Signed: 05/15/2020 5:45:50 PM By: Bradley Hurst RN, BSN Entered By: Bradley Stokes on 05/15/2020 17:31:25 -------------------------------------------------------------------------------- Lower Extremity Assessment Details Patient Name: Date of Service:  Bradley Stokes, Bradley E. 05/15/2020 1:00 PM Medical Record Number: [id number] Patient Account Number: [id number] Date of Birth/Sex: Treating RN: 02/01/1946 (75 y.o. Bradley Stokes Primary Care Bradley Stokes: Bradley Stokes Other Clinician: Referring Bradley Stokes: Treating Bradley Stokes/Extender: Bradley Stokes Weeks in Treatment: 3 Edema Assessment Assessed: [Left: No] [Right: No] Edema: [Left: Ye] [Right: s] Calf Left: Right: Point of Measurement: 34 cm From Medial Instep 39.5 cm Ankle Left: Right: Point of Measurement: 11 cm From Medial Instep 25.5 cm Vascular Assessment Pulses: Dorsalis Pedis Palpable: [Left:Yes] Electronic Signature(s) Signed: 05/15/2020 5:47:53 PM By: Bradley Gouty RN, BSN Entered By: Bradley Stokes on 05/15/2020  13:23:28 -------------------------------------------------------------------------------- Multi Wound Chart Details Patient Name: Date of Service: Bradley Stokes 05/15/2020 1:00 PM Medical Record Number: [id number] Patient Account Number: [id number] Date of Birth/Sex: Treating RN: [birth year] (75 y.o. Bradley Stokes, Bradley Stokes Primary Care Trong Gosling: Bradley Stokes Other Clinician: Referring Bradley Stokes: Treating Bradley Stokes/Extender: Bradley Stokes Weeks in Treatment: 3 Vital Signs Height(in): 69 Pulse(bpm): 84 Weight(lbs): 275 Blood Pressure(mmHg): 179/82 Body Mass Index(BMI): 41 Temperature(F): 97.5 Respiratory Rate(breaths/min): 18 Photos: [3:No Photos Left, Anterior Lower Leg] [4:No Photos Left, Medial Lower Leg] [5:No Photos Left, Proximal, Posterior Lower Leg] Wound Location: [3:Gradually Appeared] [4:Gradually Appeared] [5:Gradually Appeared] Wounding Event: [3:Lymphedema] [4:Lymphedema] [5:Lymphedema] Primary Etiology: [3:Cataracts, Lymphedema, Arrhythmia,] [4:Cataracts, Lymphedema, Arrhythmia, Cataracts, Lymphedema, Arrhythmia,] Comorbid History: [3:Hypertension, Myocardial Infarction, Peripheral Venous Disease, Gout, Osteoarthritis 04/29/2020] [4:Hypertension, Myocardial Infarction, Hypertension, Myocardial Infarction, Peripheral Venous Disease, Gout, Osteoarthritis 04/29/2020]  [5:Peripheral Venous Disease, Gout, Osteoarthritis 04/29/2020] Date Acquired: [3:2] [4:2] [5:2] Weeks of Treatment: [3:Healed - Epithelialized] [4:Open] [5:Open] Wound Status: [3:No] [4:Yes] [5:No] Clustered Wound: [3:N/A] [4:2] [5:N/A] Clustered Quantity: [3:0x0x0] [4:0.4x0.5x0.1] [5:1.8x1.3x0.1] Measurements L x W x D (cm) [3:0] [4:0.157] [5:1.838] A (cm) : rea [3:0] [4:0.016] [5:0.184] Volume (cm) : [3:100.00%] [4:97.50%] [5:22.00%] % Reduction in Area: [3:100.00%] [4:97.50%] [5:22.00%] % Reduction in Volume: [3:Full Thickness Without Exposed] [4:Full Thickness Without  Exposed] [5:Full Thickness Without Exposed] Classification: [3:Support Structures None Present] [4:Support Structures Small] [5:Support Structures Small] Exudate Amount: [3:N/A] [4:Serosanguineous] [5:Serosanguineous] Exudate Type: [3:N/A] [4:red, brown] [5:red, brown] Exudate Color: [3:Flat and Intact] [4:Distinct, outline attached] [5:Distinct, outline attached] Wound Margin: [3:None Present (0%)] [4:Large (67-100%)] [5:Large (67-100%)] Granulation Amount: [3:N/A] [4:Pink] [5:Red, Pink] Granulation Quality: [3:None Present (0%)] [4:None Present (0%)] [5:None Present (0%)] Necrotic Amount: [3:Fascia: No] [4:Fat Layer (Subcutaneous Tissue): Yes Fat Layer (Subcutaneous Tissue): Yes] Exposed Structures: [3:Fat Layer (Subcutaneous Tissue): No Tendon: No Muscle: No Joint: No Bone: No Large (67-100%)] [4:Fascia: No Tendon: No Muscle: No Joint: No Bone: No Large (67-100%)] [5:Fascia: No Tendon: No Muscle: No Joint: No Bone: No Small (1-33%)] Epithelialization: [3:N/A] [4:Compression Therapy] [5:Compression [place] Wound Number: 6 N/A N/A Photos: No Photos N/A N/A Left, Posterior Lower Leg N/A N/A Wound Location: Gradually Appeared N/A N/A Wounding Event: Lymphedema N/A N/A Primary Etiology: Cataracts, Lymphedema, Arrhythmia, N/A N/A Comorbid History: Hypertension, Myocardial Infarction, Peripheral Venous Disease, Gout, Osteoarthritis 04/29/2020 N/A N/A Date Acquired: 2 N/A N/A Weeks of Treatment: Open N/A N/A Wound Status: No N/A N/A Clustered Wound: N/A N/A N/A Clustered Quantity: 2.8x4x0.1 N/A N/A Measurements L x W x D (cm) 8.796 N/A N/A A (cm) : rea 0.88 N/A N/A Volume (cm) : 62.70% N/A N/A % Reduction in Area: 62.60% N/A N/A % Reduction in Volume: Full Thickness Without Exposed N/A N/A Classification: Support Structures Medium N/A N/A Exudate Amount: Serous N/A N/A Exudate Type: amber N/A N/A Exudate Color: Flat and Intact N/A N/A Wound Margin: Large  (67-100%) N/A N/A Granulation  Amount: Red, Friable N/A N/A Granulation Quality: None Present (0%) N/A N/A Necrotic Amount: Fat Layer (Subcutaneous Tissue): Yes N/A N/A Exposed Structures: Fascia: No Tendon: No Muscle: No Joint: No Bone: No Small (1-33%) N/A N/A Epithelialization: Compression Therapy N/A N/A Procedures Performed: Treatment Notes Electronic Signature(s) Signed: 05/15/2020 5:22:09 PM By: Linton Ham MD Signed: 05/18/2020 5:09:47 PM By: Bradley Hammock RN Entered By: Linton Ham on 05/15/2020 13:37:26 -------------------------------------------------------------------------------- Multi-Disciplinary Care Plan Details Patient Name: Date of Service: Bradley Stokes, Bradley Stokes 05/15/2020 1:00 PM Medical Record Number: [id number] Patient Account Number: [id number] Date of Birth/Sex: Treating RN: 18-Dec-1945 (75 y.o. Bradley Stokes, Bradley Stokes Primary Care Domanic Matusek: Bradley Stokes Other Clinician: Referring Mouna Yager: Treating Kha Hari/Extender: Bradley Stokes Weeks in Treatment: 3 Active Inactive Wound/Skin Impairment Nursing Diagnoses: Knowledge deficit related to ulceration/compromised skin integrity Goals: Patient will have a decrease in wound volume by X% from date: (specify in notes) Date Initiated: 04/24/2020 Target Resolution Date: 06/01/2020 Goal Status: Active Patient/caregiver will verbalize understanding of skin care regimen Date Initiated: 04/24/2020 Target Resolution Date: 06/01/2020 Goal Status: Active Ulcer/skin breakdown will have a volume reduction of 30% by week 4 Date Initiated: 04/24/2020 Target Resolution Date: 06/01/2020 Goal Status: Active Interventions: Assess patient/caregiver ability to obtain necessary supplies Assess patient/caregiver ability to perform ulcer/skin care regimen upon admission and as needed Assess ulceration(s) every visit Notes: Electronic Signature(s) Signed: 05/18/2020 5:09:47 PM By: Bradley Hammock RN Entered By: Bradley Stokes on 05/15/2020 13:56:49 -------------------------------------------------------------------------------- Pain Assessment Details Patient Name: Date of Service: Bradley Stokes 05/15/2020 1:00 PM Medical Record Number: [id number] Patient Account Number: [id number] Date of Birth/Sex: Treating RN: 1945/07/24 (75 y.o. Bradley Stokes Primary Care Meredeth Furber: Bradley Stokes Other Clinician: Referring Lacoya Wilbanks: Treating Giannamarie Paulus/Extender: Bradley Stokes Weeks in Treatment: 3 Active Problems Location of Pain Severity and Description of Pain Patient Has Paino Yes Site Locations Pain Location: Pain Location: Generalized Pain With Dressing Change: Yes Duration of the Pain. Constant / Intermittento Intermittent Rate the pain. Current Pain Level: 2 Character of Pain Describe the Pain: Aching, Tender Pain Management and Medication Current Pain Management: Medication: Yes Is the Current Pain Management Adequate: Adequate How does your wound impact your activities of daily livingo Sleep: No Bathing: No Appetite: No Relationship With Others: No Bladder Continence: No Emotions: No Bowel Continence: No Work: No Toileting: No Drive: No Dressing: No Hobbies: No Notes reports left knee pain Electronic Signature(s) Signed: 05/15/2020 5:47:53 PM By: Bradley Gouty RN, BSN Entered By: Bradley Stokes on 05/15/2020 13:10:38 -------------------------------------------------------------------------------- Patient/Caregiver Education Details Patient Name: Date of Service: Bradley Stokes, Bradley E. 2/15/2022andnbsp1:00 PM Medical Record Number: [id number] Patient Account Number: [id number] Date of Birth/Gender: Treating RN: [birth year] (74 y.o. Bradley Stokes Primary Care Physician: Bradley Stokes Other Clinician: Referring Physician: Treating Physician/Extender: Bradley Stokes in Treatment:  3 Education Assessment Education Provided To: Patient Education Topics Provided [place]: o Methods: Explain/Verbal Responses: State content correctly Electronic Signature(s) Signed: 05/18/2020 5:09:47 PM By: Bradley Hammock RN Entered By: Bradley Stokes on 05/15/2020 13:31:12 -------------------------------------------------------------------------------- Wound Assessment Details Patient Name: Date of Service: Bradley Stokes, Bradley Stokes 05/15/2020 1:00 PM Medical Record Number: [id number] Patient Account Number: [id number] Date of Birth/Sex: Treating RN: [birth year] (75 y.o. Bradley Stokes Primary Care Cierria Height: Bradley Stokes Other Clinician: Referring Gift Rueckert: Treating Ocie Stanzione/Extender: Bradley Stokes Weeks in Treatment: 3 Wound Status Wound Number: 3 Primary Lymphedema Etiology: Wound Location: Left, Anterior Lower Leg Wound Healed - Epithelialized Wounding Event:  Gradually Appeared Status: Date Acquired: 04/29/2020 Comorbid Cataracts, Lymphedema, Arrhythmia, Hypertension, Myocardial Weeks Of Treatment: 2 History: Infarction, Peripheral Venous Disease, Gout, Osteoarthritis Clustered Wound: No Photos Photo Uploaded By: Mikeal Hawthorne on 05/16/2020 13:31:30 Wound Measurements Length: (cm) Width: (cm) Depth: (cm) Area: (cm) Volume: (cm) 0 % Reduction in Area: 100% 0 % Reduction in Volume: 100% 0 Epithelialization: Large (67-100%) 0 Tunneling: No 0 Undermining: No Wound Description Classification: Full Thickness Without Exposed Support Structures Wound Margin: Flat and Intact Exudate Amount: None Present Foul Odor After Cleansing: No Slough/Fibrino No Wound Bed Granulation Amount: None Present (0%) Exposed Structure Necrotic Amount: None Present (0%) Fascia Exposed: No Fat Layer (Subcutaneous Tissue) Exposed: No Tendon Exposed: No Muscle Exposed: No Joint Exposed: No Bone Exposed: No Electronic  Signature(s) Signed: 05/15/2020 5:47:53 PM By: Bradley Gouty RN, BSN Entered By: Bradley Stokes on 05/15/2020 13:29:19 -------------------------------------------------------------------------------- Wound Assessment Details Patient Name: Date of Service: Bradley Stokes 05/15/2020 1:00 PM Medical Record Number: [id number] Patient Account Number: [id number] Date of Birth/Sex: Treating RN: 10/11/1945 (75 y.o. Bradley Stokes Primary Care Ediberto Sens: Bradley Stokes Other Clinician: Referring Tri Chittick: Treating Laurence Folz/Extender: Bradley Stokes Weeks in Treatment: 3 Wound Status Wound Number: 4 Primary Lymphedema Etiology: Wound Location: Left, Medial Lower Leg Wound Open Wounding Event: Gradually Appeared Status: Date Acquired: 04/29/2020 Comorbid Cataracts, Lymphedema, Arrhythmia, Hypertension, Myocardial Weeks Of Treatment: 2 History: Infarction, Peripheral Venous Disease, Gout, Osteoarthritis Clustered Wound: Yes Photos Photo Uploaded By: Mikeal Hawthorne on 05/16/2020 13:31:18 Wound Measurements Length: (cm) 0.4 Width: (cm) 0.5 Depth: (cm) 0.1 Clustered Quantity: 2 Area: (cm) 0.157 Volume: (cm) 0.016 % Reduction in Area: 97.5% % Reduction in Volume: 97.5% Epithelialization: Large (67-100%) Tunneling: No Undermining: No Wound Description Classification: Full Thickness Without Exposed Support Structures Wound Margin: Distinct, outline attached Exudate Amount: Small Exudate Type: Serosanguineous Exudate Color: red, brown Foul Odor After Cleansing: No Slough/Fibrino Yes Wound Bed Granulation Amount: Large (67-100%) Exposed Structure Granulation Quality: Pink Fascia Exposed: No Necrotic Amount: None Present (0%) Fat Layer (Subcutaneous Tissue) Exposed: Yes Tendon Exposed: No Muscle Exposed: No Joint Exposed: No Bone Exposed: No Treatment Notes Wound #4 (Lower Leg) Wound Laterality: Left, Medial Cleanser Soap and Water Discharge  Instruction: May shower and wash wound with dial antibacterial soap and water prior to dressing change. Normal Saline Discharge Instruction: Cleanse the wound with Normal Saline prior to applying a clean dressing using gauze sponges, not tissue or cotton balls. Peri-Wound Care Sween Lotion (Moisturizing lotion) Discharge Instruction: Apply moisturizing lotion as directed Triamcinolone 15 (g) Discharge Instruction: Use triamcinolone 15 (g) as directed Topical Primary Dressing Hydrofera Blue Classic Foam, 2x2 in Discharge Instruction: Moisten with saline prior to applying to wound bed Secondary Dressing Woven Gauze Sponge, Non-Sterile 4x4 in Discharge Instruction: Apply over primary dressing as directed. Secured With Compression Wrap FourPress (4 layer compression wrap) Discharge Instruction: Apply four layer compression as directed. Compression Stockings Add-Ons Electronic Signature(s) Signed: 05/15/2020 5:47:53 PM By: Bradley Gouty RN, BSN Entered By: Bradley Stokes on 05/15/2020 13:29:39 -------------------------------------------------------------------------------- Wound Assessment Details Patient Name: Date of Service: Bradley Stokes, Bradley Stokes 05/15/2020 1:00 PM Medical Record Number: [id number] Patient Account Number: [id number] Date of Birth/Sex: Treating RN: 22-Feb-1946 (75 y.o. Bradley Stokes Primary Care Neyland Pettengill: Bradley Stokes Other Clinician: Referring Madalynne Gutmann: Treating Lycan Davee/Extender: Bradley Stokes Weeks in Treatment: 3 Wound Status Wound Number: 5 Primary Lymphedema Etiology: Wound Location: Left, Proximal, Posterior Lower Leg Wound Open Wounding Event: Gradually Appeared Status: Date Acquired: 04/29/2020 Comorbid Cataracts, Lymphedema, Arrhythmia, Hypertension,  Myocardial Weeks Of Treatment: 2 History: Infarction, Peripheral Venous Disease, Gout, Osteoarthritis Clustered Wound: No Photos Photo Uploaded By: Mikeal Hawthorne on  05/16/2020 13:31:01 Wound Measurements Length: (cm) 1.8 Width: (cm) 1.3 Depth: (cm) 0.1 Area: (cm) 1.838 Volume: (cm) 0.184 % Reduction in Area: 22% % Reduction in Volume: 22% Epithelialization: Small (1-33%) Tunneling: No Undermining: No Wound Description Classification: Full Thickness Without Exposed Support Structures Wound Margin: Distinct, outline attached Exudate Amount: Small Exudate Type: Serosanguineous Exudate Color: red, brown Foul Odor After Cleansing: No Slough/Fibrino Yes Wound Bed Granulation Amount: Large (67-100%) Exposed Structure Granulation Quality: Red, Pink Fascia Exposed: No Necrotic Amount: None Present (0%) Fat Layer (Subcutaneous Tissue) Exposed: Yes Tendon Exposed: No Muscle Exposed: No Joint Exposed: No Bone Exposed: No Treatment Notes Wound #5 (Lower Leg) Wound Laterality: Left, Posterior, Proximal Cleanser Soap and Water Discharge Instruction: May shower and wash wound with dial antibacterial soap and water prior to dressing change. Normal Saline Discharge Instruction: Cleanse the wound with Normal Saline prior to applying a clean dressing using gauze sponges, not tissue or cotton balls. Peri-Wound Care Sween Lotion (Moisturizing lotion) Discharge Instruction: Apply moisturizing lotion as directed Triamcinolone 15 (g) Discharge Instruction: Use triamcinolone 15 (g) as directed Topical Primary Dressing Hydrofera Blue Classic Foam, 2x2 in Discharge Instruction: Moisten with saline prior to applying to wound bed Secondary Dressing Woven Gauze Sponge, Non-Sterile 4x4 in Discharge Instruction: Apply over primary dressing as directed. Secured With Compression Wrap FourPress (4 layer compression wrap) Discharge Instruction: Apply four layer compression as directed. Compression Stockings Add-Ons Electronic Signature(s) Signed: 05/15/2020 5:47:53 PM By: Bradley Gouty RN, BSN Entered By: Bradley Stokes on 05/15/2020  13:29:52 -------------------------------------------------------------------------------- Wound Assessment Details Patient Name: Date of Service: Bradley Stokes, Bradley Stokes 05/15/2020 1:00 PM Medical Record Number: [id number] Patient Account Number: [id number] Date of Birth/Sex: Treating RN: Sep 05, 1945 (75 y.o. Bradley Stokes Primary Care Gustavo Dispenza: Bradley Stokes Other Clinician: Referring Rillie Riffel: Treating Amaris Garrette/Extender: Bradley Stokes Weeks in Treatment: 3 Wound Status Wound Number: 6 Primary Lymphedema Etiology: Wound Location: Left, Posterior Lower Leg Wound Open Wounding Event: Gradually Appeared Status: Date Acquired: 04/29/2020 Comorbid Cataracts, Lymphedema, Arrhythmia, Hypertension, Myocardial Comorbid Cataracts, Lymphedema, Arrhythmia, Hypertension, Myocardial Weeks Of Treatment: 2 History: Infarction, Peripheral Venous Disease, Gout, Osteoarthritis Clustered Wound: No Photos Photo Uploaded By: Mikeal Hawthorne on 05/16/2020 13:31:02 Wound Measurements Length: (cm) 2.8 Width: (cm) 4 Depth: (cm) 0.1 Area: (cm) 8.796 Volume: (cm) 0.88 % Reduction in Area: 62.7% % Reduction in Volume: 62.6% Epithelialization: Small (1-33%) Tunneling: No Undermining: No Wound Description Classification: Full Thickness Without Exposed Support Structures Wound Margin: Flat and Intact Exudate Amount: Medium Exudate Type: Serous Exudate Color: amber Foul Odor After Cleansing: No Slough/Fibrino No Wound Bed Granulation Amount: Large (67-100%) Exposed Structure Granulation Quality: Red, Friable Fascia Exposed: No Necrotic Amount: None Present (0%) Fat Layer (Subcutaneous Tissue) Exposed: Yes Tendon Exposed: No Muscle Exposed: No Joint Exposed: No Bone Exposed: No Treatment Notes Wound #6 (Lower Leg) Wound Laterality: Left, Posterior Cleanser Soap and Water Discharge Instruction: May shower and wash wound with dial antibacterial soap and water prior  to dressing change. Normal Saline Discharge Instruction: Cleanse the wound with Normal Saline prior to applying a clean dressing using gauze sponges, not tissue or cotton balls. Peri-Wound Care Sween Lotion (Moisturizing lotion) Discharge Instruction: Apply moisturizing lotion as directed Triamcinolone 15 (g) Discharge Instruction: Use triamcinolone 15 (g) as directed Topical Primary Dressing Hydrofera Blue Classic Foam, 2x2 in Discharge Instruction: Moisten with saline prior to applying to wound bed Secondary  Dressing Woven Gauze Sponge, Non-Sterile 4x4 in Discharge Instruction: Apply over primary dressing as directed. Secured With Compression Wrap FourPress (4 layer compression wrap) Discharge Instruction: Apply four layer compression as directed. Compression Stockings Circaid Juxta Lite Compression Wrap Quantity: 1 Left Leg Compression Amount: 30-40 mmHg Discharge Instruction: Apply Circaid Juxta Lite Compression Wrap daily as instructed. Apply first thing in the morning, remove at night before bed. Add-Ons Electronic Signature(s) Signed: 05/15/2020 5:47:53 PM By: Bradley Gouty RN, BSN Entered By: Bradley Stokes on 05/15/2020 13:30:15 -------------------------------------------------------------------------------- Vitals Details Patient Name: Date of Service: Bradley Stokes 05/15/2020 1:00 PM Medical Record Number: [id number] Patient Account Number: [id number] Date of Birth/Sex: Treating RN: 23-Sep-1945 (74 y.o. Bradley Stokes Primary Care Huldah Marin: Bradley Stokes Other Clinician: Referring Janine Reller: Treating [place] Gum/Extender: Bradley Stokes Weeks in Treatment: 3 Vital Signs Time Taken: 13:06 Temperature (F): 97.5 Height (in): 69 Pulse (bpm): 84 Source: Stated Respiratory Rate (breaths/min): 18 Weight (lbs): 275 Blood Pressure (mmHg): 179/82 Source: Stated Reference Range: 80 - 120 mg / dl Body Mass Index (BMI):  40.6 Electronic Signature(s) Signed: 05/15/2020 5:47:53 PM By: Bradley Gouty RN, BSN Entered By: Bradley Stokes on 05/15/2020 13:09:59

## 2020-05-18 NOTE — Progress Notes (Signed)
[name], [name] ([id number]) Visit Report for 05/15/2020 HPI Details Patient Name: Date of Service: Bradley Stokes, Bradley Stokes 05/15/2020 1:00 PM Medical Record Number: [id number] Patient Account Number: [id number] Date of Birth/Sex: Treating RN: 1945/04/28 (75 y.o. Bradley Stokes Primary Care Provider: [name] Other Clinician: Referring Provider: Treating Provider/Extender: [name] in Treatment: 3 History of Present Illness HPI Description: ADMISSION 04/24/2020 Bradley Stokes is now a 75 year old nondiabetic man. He was here in 2010 and again in 2013 he says the last time for a traumatic wound at work in the setting of chronic venous disease and lymphedema. He has stockings at home but hasn't been wearing them for several weeks now. He says his primary provider Dr. Lyndle Herrlich sent him here for review of severe lymphedema with secondary skin changes including dry cracked hyperkeratotic vericused areas bilaterally. He has been using ammonium lactate lotion to try and soak off some of the dry flaking skin but really hadn't any open wound per se. He has not been using his compression stockings for 2 or 3 weeks he states because he is using the lotion on his legs and he wasn't sure whether to do both. Past medical history includes paroxysmal atrial fibrillation on Coumadin, hypertension, varicose veins and hyperlipidemia, stage III chronic renal failure, hyperlipidemia and probably osteoarthritis 2/1; severe bilateral lymphedema chronic venous insufficiency. Skin changes of lymphedema chronic xerotic skin. He had open areas on the posterior left calf last time. Arrives in clinic with much better edema control. He also has superficial areas on the right anterior were just noticing this week 2/8; the patient has better edema control. He has chronic xerotic skin and skin changes of lymphedema. He has nothing open on the right leg the area on the left is still open but  looking better. 2/15. He comes in with his juxta lite stocking on the right leg and everything seems fine here. He is using the ammonium lactate lotion. He has 3 areas 2 posteriorly and 1 medially on the left leg we have been using Hydrofera Blue under compression Electronic Signature(s) Signed: 05/15/2020 5:22:09 PM By: Linton Ham MD Entered By: Linton Ham on 05/15/2020 13:38:15 -------------------------------------------------------------------------------- Physical Exam Details Patient Name: Date of Service: Bradley Stokes 05/15/2020 1:00 PM Medical Record Number: [id number] Patient Account Number: [id number] Date of Birth/Sex: Treating RN: 1946/01/31 (75 y.o. Bradley Stokes Primary Care Provider: [name] Other Clinician: Referring Provider: Treating Provider/Extender: [name] in Treatment: 3 Cardiovascular Pedal pulses are palpable. We have good edema control. Integumentary (Hair, Skin) Skin changes of chronic lymphedema. Notes Wound exam; left leg wounds are superficial but still not close there is 2 posteriorly and a small 1 medially. There is some dry flaking skin around the edges here but I elected not to remove this. Surface of wound looks healthy. No evidence of infection Electronic Signature(s) Signed: 05/15/2020 5:22:09 PM By: Linton Ham MD Entered By: Linton Ham on 05/15/2020 13:40:10 -------------------------------------------------------------------------------- Physician Orders Details Patient Name: Date of Service: Bradley Stokes 05/15/2020 1:00 PM Medical Record Number: [id number] Patient Account Number: [id number] Date of Birth/Sex: Treating RN: [birth year] (75 y.o. Bradley Stokes Primary Care Provider: [name] Other Clinician: Referring Provider: Treating Provider/Extender: [name] in Treatment: 3 Verbal / Phone Orders: No Diagnosis  Coding Follow-up Appointments Return Appointment in 1 week. Bathing/ Shower/ Hygiene May shower with protection but do not get wound dressing(s) wet. Edema Control - Lymphedema /  SCD / Other Elevate legs to the level of the heart or above for 30 minutes daily and/or when sitting, a frequency of: Avoid standing for long periods of time. Patient to wear own compression stockings every day. - Wear the juxtalite on the right leg. Apply in the morning and remove at bedtime. Moisturize legs daily. - Also continue using the ammonium lactate as prescribed by your PCP Wound Treatment Wound #4 - Lower Leg Wound Laterality: Left, Medial Cleanser: Soap and Water 1 x Per Week/15 Days Discharge Instructions: May shower and wash wound with dial antibacterial soap and water prior to dressing change. Cleanser: Normal Saline 1 x Per Week/15 Days Discharge Instructions: Cleanse the wound with Normal Saline prior to applying a clean dressing using gauze sponges, not tissue or cotton balls. Peri-Wound Care: Sween Lotion (Moisturizing lotion) 1 x Per Week/15 Days Discharge Instructions: Apply moisturizing lotion as directed Peri-Wound Care: Triamcinolone 15 (g) 1 x Per Week/15 Days Discharge Instructions: Use triamcinolone 15 (g) as directed Prim Dressing: Hydrofera Blue Classic Foam, 2x2 in 1 x Per Week/15 Days ary Discharge Instructions: Moisten with saline prior to applying to wound bed Secondary Dressing: Woven Gauze Sponge, Non-Sterile 4x4 in 1 x Per Week/15 Days Discharge Instructions: Apply over primary dressing as directed. Compression Wrap: FourPress (4 layer compression wrap) 1 x Per Week/15 Days Discharge Instructions: Apply four layer compression as directed. Wound #5 - Lower Leg Wound Laterality: Left, Posterior, Proximal Cleanser: Soap and Water 1 x Per Week/15 Days Discharge Instructions: May shower and wash wound with dial antibacterial soap and water prior to dressing change. Cleanser:  Normal Saline 1 x Per Week/15 Days Discharge Instructions: Cleanse the wound with Normal Saline prior to applying a clean dressing using gauze sponges, not tissue or cotton balls. Peri-Wound Care: Sween Lotion (Moisturizing lotion) 1 x Per Week/15 Days Discharge Instructions: Apply moisturizing lotion as directed Peri-Wound Care: Triamcinolone 15 (g) 1 x Per Week/15 Days Discharge Instructions: Use triamcinolone 15 (g) as directed Prim Dressing: Hydrofera Blue Classic Foam, 2x2 in 1 x Per Week/15 Days ary Discharge Instructions: Moisten with saline prior to applying to wound bed Secondary Dressing: Woven Gauze Sponge, Non-Sterile 4x4 in 1 x Per Week/15 Days Discharge Instructions: Apply over primary dressing as directed. Compression Wrap: FourPress (4 layer compression wrap) 1 x Per Week/15 Days Discharge Instructions: Apply four layer compression as directed. Wound #6 - Lower Leg Wound Laterality: Left, Posterior Cleanser: Soap and Water 1 x Per Week/15 Days Discharge Instructions: May shower and wash wound with dial antibacterial soap and water prior to dressing change. Cleanser: Normal Saline 1 x Per Week/15 Days Discharge Instructions: Cleanse the wound with Normal Saline prior to applying a clean dressing using gauze sponges, not tissue or cotton balls. Peri-Wound Care: Sween Lotion (Moisturizing lotion) 1 x Per Week/15 Days Discharge Instructions: Apply moisturizing lotion as directed Peri-Wound Care: Triamcinolone 15 (g) 1 x Per Week/15 Days Discharge Instructions: Use triamcinolone 15 (g) as directed Prim Dressing: Hydrofera Blue Classic Foam, 2x2 in 1 x Per Week/15 Days ary Discharge Instructions: Moisten with saline prior to applying to wound bed Secondary Dressing: Woven Gauze Sponge, Non-Sterile 4x4 in 1 x Per Week/15 Days Discharge Instructions: Apply over primary dressing as directed. Compression Wrap: FourPress (4 layer compression wrap) 1 x Per Week/15 Days Discharge  Instructions: Apply four layer compression as directed. Compression Stockings: Circaid Juxta Lite Compression Wrap Left Leg Compression Amount: 30-40 mmHG Discharge Instructions: Apply Circaid Juxta Lite Compression Wrap daily as instructed. Apply  first thing in the morning, remove at night before bed. Electronic Signature(s) Signed: 05/15/2020 5:22:09 PM By: Linton Ham MD Signed: 05/18/2020 5:09:47 PM By: [name] RN Entered By: [name] on 05/15/2020 13:31:53 -------------------------------------------------------------------------------- Problem List Details Patient Name: Date of Service: Bradley Stokes, Bradley Stokes 05/15/2020 1:00 PM Medical Record Number: [id number] Patient Account Number: [id number] Date of Birth/Sex: Treating RN: [birth year] (75 y.o. [name], [name] Primary Care Provider: [name] Other Clinician: Referring Provider: Treating Provider/Extender: [name] in Treatment: 3 Active Problems ICD-10 Encounter Code Description Active Date MDM Diagnosis I89.0 Lymphedema, not elsewhere classified 04/24/2020 No Yes L97.828 Non-pressure chronic ulcer of other part of left lower leg with other specified 04/24/2020 No Yes severity Inactive Problems ICD-10 Code Description Active Date Inactive Date L97.811 Non-pressure chronic ulcer of other part of right lower leg limited to breakdown of skin 05/01/2020 05/01/2020 Resolved Problems Electronic Signature(s) Signed: 05/15/2020 5:22:09 PM By: Linton Ham MD Entered By: Linton Ham on 05/15/2020 13:35:18 -------------------------------------------------------------------------------- Progress Note Details Patient Name: Date of Service: Bradley Stokes 05/15/2020 1:00 PM Medical Record Number: [id number] Patient Account Number: [id number] Date of Birth/Sex: Treating RN: 1945-12-28 (75 y.o. Bradley Stokes Primary Care Provider: [name] Other  Clinician: Referring Provider: Treating Provider/Extender: [name] in Treatment: 3 Subjective History of Present Illness (HPI) ADMISSION 04/24/2020 Mr. Auguste is now a 75 year old nondiabetic man. He was here in 2010 and again in 2013 he says the last time for a traumatic wound at work in the setting of chronic venous disease and lymphedema. He has stockings at home but hasn't been wearing them for several weeks now. He says his primary provider Dr. Lyndle Herrlich sent him here for review of severe lymphedema with secondary skin changes including dry cracked hyperkeratotic vericused areas bilaterally. He has been using ammonium lactate lotion to try and soak off some of the dry flaking skin but really hadn't any open wound per se. He has not been using his compression stockings for 2 or 3 weeks he states because he is using the lotion on his legs and he wasn't sure whether to do both. Past medical history includes paroxysmal atrial fibrillation on Coumadin, hypertension, varicose veins and hyperlipidemia, stage III chronic renal failure, hyperlipidemia and probably osteoarthritis 2/1; severe bilateral lymphedema chronic venous insufficiency. Skin changes of lymphedema chronic xerotic skin. He had open areas on the posterior left calf last time. Arrives in clinic with much better edema control. He also has superficial areas on the right anterior were just noticing this week 2/8; the patient has better edema control. He has chronic xerotic skin and skin changes of lymphedema. He has nothing open on the right leg the area on the left is still open but looking better. 2/15. He comes in with his juxta lite stocking on the right leg and everything seems fine here. He is using the ammonium lactate lotion. He has 3 areas 2 posteriorly and 1 medially on the left leg we have been using Hydrofera Blue under compression Objective Constitutional Vitals Time Taken: 1:06 PM, Height: 69  in, Source: Stated, Weight: 275 lbs, Source: Stated, BMI: 40.6, Temperature: 97.5 F, Pulse: 84 bpm, Respiratory Rate: 18 breaths/min, Blood Pressure: 179/82 mmHg. Cardiovascular Pedal pulses are palpable. We have good edema control. General Notes: Wound exam; left leg wounds are superficial but still not close there is 2 posteriorly and a small 1 medially. There is some dry flaking skin around the edges here but  I elected not to remove this. Surface of wound looks healthy. No evidence of infection Integumentary (Hair, Skin) Skin changes of chronic lymphedema. Wound #3 status is Healed - Epithelialized. Original cause of wound was Gradually Appeared. The wound is located on the Left,Anterior Lower Leg. The wound measures 0cm length x 0cm width x 0cm depth; 0cm^2 area and 0cm^3 volume. There is no tunneling or undermining noted. There is a none present amount of drainage noted. The wound margin is flat and intact. There is no granulation within the wound bed. There is no necrotic tissue within the wound bed. Wound #4 status is Open. Original cause of wound was Gradually Appeared. The wound is located on the Left,Medial Lower Leg. The wound measures 0.4cm length x 0.5cm width x 0.1cm depth; 0.157cm^2 area and 0.016cm^3 volume. There is Fat Layer (Subcutaneous Tissue) exposed. There is no tunneling or undermining noted. There is a small amount of serosanguineous drainage noted. The wound margin is distinct with the outline attached to the wound base. There is large (67-100%) pink granulation within the wound bed. There is no necrotic tissue within the wound bed. Wound #5 status is Open. Original cause of wound was Gradually Appeared. The wound is located on the Left,Proximal,Posterior Lower Leg. The wound measures 1.8cm length x 1.3cm width x 0.1cm depth; 1.838cm^2 area and 0.184cm^3 volume. There is Fat Layer (Subcutaneous Tissue) exposed. There is no tunneling or undermining noted. There is a small  amount of serosanguineous drainage noted. The wound margin is distinct with the outline attached to the wound base. There is large (67-100%) red, pink granulation within the wound bed. There is no necrotic tissue within the wound bed. Wound #6 status is Open. Original cause of wound was Gradually Appeared. The wound is located on the Left,Posterior Lower Leg. The wound measures 2.8cm length x 4cm width x 0.1cm depth; 8.796cm^2 area and 0.88cm^3 volume. There is Fat Layer (Subcutaneous Tissue) exposed. There is no tunneling or undermining noted. There is a medium amount of serous drainage noted. The wound margin is flat and intact. There is large (67-100%) red, friable granulation within the wound bed. There is no necrotic tissue within the wound bed. Assessment Active Problems ICD-10 Lymphedema, not elsewhere classified Non-pressure chronic ulcer of other part of left lower leg with other specified severity Procedures Wound #4 Pre-procedure diagnosis of Wound #4 is a Lymphedema located on the Left,Medial Lower Leg . There was a Four Layer Compression Therapy Procedure by [name], RN. Post procedure Diagnosis Wound #4: Same as Pre-Procedure Wound #5 Pre-procedure diagnosis of Wound #5 is a Lymphedema located on the Left,Proximal,Posterior Lower Leg . There was a Four Layer Compression Therapy Procedure by [name], RN. Post procedure Diagnosis Wound #5: Same as Pre-Procedure Wound #6 Pre-procedure diagnosis of Wound #6 is a Lymphedema located on the Left,Posterior Lower Leg . There was a Four Layer Compression Therapy Procedure by [name], RN. Post procedure Diagnosis Wound #6: Same as Pre-Procedure Plan Follow-up Appointments: Return Appointment in 1 week. Bathing/ Shower/ Hygiene: May shower with protection but do not get wound dressing(s) wet. Edema Control - Lymphedema / SCD / Other: Elevate legs to the level of the heart or above for 30 minutes daily  and/or when sitting, a frequency of: Avoid standing for long periods of time. Patient to wear own compression stockings every day. - Wear the juxtalite on the right leg. Apply in the morning and remove at bedtime. Moisturize legs daily. - Also continue using the ammonium  lactate as prescribed by your PCP WOUND #4: - Lower Leg Wound Laterality: Left, Medial Cleanser: Soap and Water 1 x Per Week/15 Days Discharge Instructions: May shower and wash wound with dial antibacterial soap and water prior to dressing change. Cleanser: Normal Saline 1 x Per Week/15 Days Discharge Instructions: Cleanse the wound with Normal Saline prior to applying a clean dressing using gauze sponges, not tissue or cotton balls. Peri-Wound Care: Sween Lotion (Moisturizing lotion) 1 x Per Week/15 Days Discharge Instructions: Apply moisturizing lotion as directed Peri-Wound Care: Triamcinolone 15 (g) 1 x Per Week/15 Days Discharge Instructions: Use triamcinolone 15 (g) as directed Prim Dressing: Hydrofera Blue Classic Foam, 2x2 in 1 x Per Week/15 Days ary Discharge Instructions: Moisten with saline prior to applying to wound bed Secondary Dressing: Woven Gauze Sponge, Non-Sterile 4x4 in 1 x Per Week/15 Days Discharge Instructions: Apply over primary dressing as directed. Com pression Wrap: FourPress (4 layer compression wrap) 1 x Per Week/15 Days Discharge Instructions: Apply four layer compression as directed. WOUND #5: - Lower Leg Wound Laterality: Left, Posterior, Proximal Cleanser: Soap and Water 1 x Per Week/15 Days Discharge Instructions: May shower and wash wound with dial antibacterial soap and water prior to dressing change. Cleanser: Normal Saline 1 x Per Week/15 Days Discharge Instructions: Cleanse the wound with Normal Saline prior to applying a clean dressing using gauze sponges, not tissue or cotton balls. Peri-Wound Care: Sween Lotion (Moisturizing lotion) 1 x Per Week/15 Days Discharge Instructions: Apply  moisturizing lotion as directed Peri-Wound Care: Triamcinolone 15 (g) 1 x Per Week/15 Days Discharge Instructions: Use triamcinolone 15 (g) as directed Prim Dressing: Hydrofera Blue Classic Foam, 2x2 in 1 x Per Week/15 Days ary Discharge Instructions: Moisten with saline prior to applying to wound bed Secondary Dressing: Woven Gauze Sponge, Non-Sterile 4x4 in 1 x Per Week/15 Days Discharge Instructions: Apply over primary dressing as directed. Com pression Wrap: FourPress (4 layer compression wrap) 1 x Per Week/15 Days Discharge Instructions: Apply four layer compression as directed. WOUND #6: - Lower Leg Wound Laterality: Left, Posterior Cleanser: Soap and Water 1 x Per Week/15 Days Discharge Instructions: May shower and wash wound with dial antibacterial soap and water prior to dressing change. Cleanser: Normal Saline 1 x Per Week/15 Days Discharge Instructions: Cleanse the wound with Normal Saline prior to applying a clean dressing using gauze sponges, not tissue or cotton balls. Peri-Wound Care: Sween Lotion (Moisturizing lotion) 1 x Per Week/15 Days Discharge Instructions: Apply moisturizing lotion as directed Peri-Wound Care: Triamcinolone 15 (g) 1 x Per Week/15 Days Discharge Instructions: Use triamcinolone 15 (g) as directed Prim Dressing: Hydrofera Blue Classic Foam, 2x2 in 1 x Per Week/15 Days ary Discharge Instructions: Moisten with saline prior to applying to wound bed Secondary Dressing: Woven Gauze Sponge, Non-Sterile 4x4 in 1 x Per Week/15 Days Discharge Instructions: Apply over primary dressing as directed. Com pression Wrap: FourPress (4 layer compression wrap) 1 x Per Week/15 Days Discharge Instructions: Apply four layer compression as directed. Com pression Stockings: Circaid Juxta Lite Compression Wrap Compression Amount: 30-40 mmHg (left) Discharge Instructions: Apply Circaid Juxta Lite Compression Wrap daily as instructed. Apply first thing in the morning, remove at  night before bed. 1. Continue with Hydrofera Blue 2. Moisturizer and triamcinolone to the skin under the wrap. Electronic Signature(s) Signed: 05/15/2020 5:22:09 PM By: Linton Ham MD Entered By: Linton Ham on 05/15/2020 13:40:55 -------------------------------------------------------------------------------- SuperBill Details Patient Name: Date of Service: Bradley Stokes 05/15/2020 Medical Record Number: [id number] Patient Account Number: [id number]  Date of Birth/Sex: Treating RN: [birth year] (75 y.o. [name], [name] Primary Care Provider: [name] Other Clinician: Referring Provider: Treating Provider/Extender: [name] in Treatment: 3 Diagnosis Coding ICD-10 Codes Code Description I89.0 Lymphedema, not elsewhere classified L97.828 Non-pressure chronic ulcer of other part of left lower leg with other specified severity Facility Procedures CPT4 Code: [id number] Description: (Facility Use Only) [id number] - [facility] LWR LT LEG Modifier: Quantity: 1 Physician Procedures : CPT4 Code Description Modifier [id number] - WC PHYS LEVEL 3 - EST PT ICD-10 Diagnosis Description I89.0 Lymphedema, not elsewhere classified L97.828 Non-pressure chronic ulcer of other part of left lower leg with other specified severity Quantity: 1 Electronic Signature(s) Signed: 05/15/2020 5:22:09 PM By: Linton Ham MD Entered By: Linton Ham on 05/15/2020 13:41:14

## 2020-05-22 ENCOUNTER — Other Ambulatory Visit: Payer: Self-pay

## 2020-05-22 ENCOUNTER — Encounter (HOSPITAL_BASED_OUTPATIENT_CLINIC_OR_DEPARTMENT_OTHER): Payer: Medicare HMO | Admitting: Internal Medicine

## 2020-05-22 DIAGNOSIS — L97828 Non-pressure chronic ulcer of other part of left lower leg with other specified severity: Secondary | ICD-10-CM | POA: Diagnosis not present

## 2020-05-22 DIAGNOSIS — I890 Lymphedema, not elsewhere classified: Secondary | ICD-10-CM | POA: Diagnosis not present

## 2020-05-22 DIAGNOSIS — L97222 Non-pressure chronic ulcer of left calf with fat layer exposed: Secondary | ICD-10-CM | POA: Diagnosis not present

## 2020-05-22 NOTE — Progress Notes (Signed)
Bradley Stokes, Bradley Stokes ([id number]) Visit Report for 05/22/2020 HPI Details Patient Name: Date of Service: Bradley Stokes, Bradley Stokes 05/22/2020 10:30 A M Medical Record Number: [id number] Patient Account Number: [id number] Date of Birth/Sex: Treating RN: 1945/11/27 (75 y.o. Bradley Stokes Primary Care Provider: Aretta Stokes Other Clinician: Referring Provider: Treating Provider/Extender: Bradley Stokes Weeks in Treatment: 4 History of Present Illness HPI Description: ADMISSION 04/24/2020 Bradley Stokes is now a 75 year old nondiabetic man. He was here in 2010 and again in 2013 he says the last time for a traumatic wound at work in the setting of chronic venous disease and lymphedema. He has stockings at home but hasn't been wearing them for several weeks now. He says his primary provider Dr. Lyndle Stokes sent him here for review of severe lymphedema with secondary skin changes including dry cracked hyperkeratotic vericused areas bilaterally. He has been using ammonium lactate lotion to try and soak off some of the dry flaking skin but really hadn't any open wound per se. He has not been using his compression stockings for 2 or 3 weeks he states because he is using the lotion on his legs and he wasn't sure whether to do both. Past medical history includes paroxysmal atrial fibrillation on Coumadin, hypertension, varicose veins and hyperlipidemia, stage III chronic renal failure, hyperlipidemia and probably osteoarthritis 2/1; severe bilateral lymphedema chronic venous insufficiency. Skin changes of lymphedema chronic xerotic skin. He had open areas on the posterior left calf last time. Arrives in clinic with much better edema control. He also has superficial areas on the right anterior were just noticing this week 2/8; the patient has better edema control. He has chronic xerotic skin and skin changes of lymphedema. He has nothing open on the right leg the area on the left is still open but  looking better. 2/15. He comes in with his juxta lite stocking on the right leg and everything seems fine here. He is using the ammonium lactate lotion. He has 3 areas 2 posteriorly and 1 medially on the left leg we have been using Hydrofera Blue under compression 2/22; juxta lite stockings on the right leg everything is healed here. He is using the ammonium lactate lotion. The only wounds that are open now where the tumor posteriorly. I am not sure about the surface of these in terms of viability but certainly they bleed easily. Using Bradley Stokes Electronic Signature(s) Signed: 05/22/2020 5:22:30 PM By: Bradley Stokes Entered By: Bradley Stokes on 05/22/2020 12:37:48 -------------------------------------------------------------------------------- Physical Exam Details Patient Name: Date of Service: Bradley Stokes, Bradley Stokes 05/22/2020 10:30 A M Medical Record Number: [id number] Patient Account Number: [id number] Date of Birth/Sex: Treating RN: [birth year] (75 y.o. Bradley Stokes Primary Care Provider: Aretta Stokes Other Clinician: Referring Provider: Treating Provider/Extender: Bradley Stokes Weeks in Treatment: 4 Constitutional Patient is hypertensive.. Pulse regular and within target range for patient.Marland Kitchen Respirations regular, non-labored and within target range.. Temperature is normal and within the target range for the patient.Marland Kitchen Appears in no distress. Cardiovascular Edema is controlled. Notes Wound exam; left leg wounds anteriorly are healed she has 2 areas posteriorly one of them with some size the other is very small. Both of these are quite vascular and bleed easily but the surfaces of the wounds appear reasonable. Electronic Signature(s) Signed: 05/22/2020 5:22:30 PM By: Bradley Stokes Signed: 05/22/2020 5:22:30 PM By: Bradley Stokes Entered By: Bradley Stokes on 05/22/2020  12:38:51 -------------------------------------------------------------------------------- Physician Orders Details Patient Name: Date of Service: Bradley Stokes.  05/22/2020 10:30 A M Medical Record Number: [id number] Patient Account Number: [id number] Date of Birth/Sex: Treating RN: [birth year] (75 y.o. Bradley Stokes Primary Care Provider: Aretta Stokes Other Clinician: Referring Provider: Treating Provider/Extender: Bradley Stokes in Treatment: 4 Verbal / Phone Orders: No Diagnosis Coding Follow-up Appointments Return Appointment in 1 week. Bathing/ Shower/ Hygiene May shower with protection but do not get wound dressing(s) wet. Edema Control - Lymphedema / SCD / Other Elevate legs to the level of the heart or above for 30 minutes daily and/or when sitting, a frequency of: Avoid standing for long periods of time. Patient to wear own compression stockings every day. - Wear the juxtalite on the right leg. Apply in the morning and remove at bedtime. Moisturize legs daily. - Also continue using the ammonium lactate as prescribed by your PCP Wound Treatment Wound #5 - Lower Leg Wound Laterality: Left, Posterior, Proximal Cleanser: Soap and Water 1 x Per Week/15 Days Discharge Instructions: May shower and wash wound with dial antibacterial soap and water prior to dressing change. Cleanser: Normal Saline 1 x Per Week/15 Days Discharge Instructions: Cleanse the wound with Normal Saline prior to applying a clean dressing using gauze sponges, not tissue or cotton balls. Peri-Wound Care: Sween Lotion (Moisturizing lotion) 1 x Per Week/15 Days Discharge Instructions: Apply moisturizing lotion as directed Peri-Wound Care: Triamcinolone 15 (g) 1 x Per Week/15 Days Discharge Instructions: Use triamcinolone 15 (g) as directed Prim Dressing: Hydrofera Blue Classic Foam, 2x2 in 1 x Per Week/15 Days ary Discharge Instructions: Moisten with saline prior to  applying to wound bed Secondary Dressing: Woven Gauze Sponge, Non-Sterile 4x4 in 1 x Per Week/15 Days Discharge Instructions: Apply over primary dressing as directed. Compression Wrap: FourPress (4 layer compression wrap) 1 x Per Week/15 Days Discharge Instructions: Apply four layer compression as directed. Wound #6 - Lower Leg Wound Laterality: Left, Posterior Cleanser: Soap and Water 1 x Per Week/15 Days Discharge Instructions: May shower and wash wound with dial antibacterial soap and water prior to dressing change. Cleanser: Normal Saline 1 x Per Week/15 Days Discharge Instructions: Cleanse the wound with Normal Saline prior to applying a clean dressing using gauze sponges, not tissue or cotton balls. Peri-Wound Care: Sween Lotion (Moisturizing lotion) 1 x Per Week/15 Days Discharge Instructions: Apply moisturizing lotion as directed Peri-Wound Care: Triamcinolone 15 (g) 1 x Per Week/15 Days Discharge Instructions: Use triamcinolone 15 (g) as directed Prim Dressing: Hydrofera Blue Classic Foam, 2x2 in 1 x Per Week/15 Days ary Discharge Instructions: Moisten with saline prior to applying to wound bed Secondary Dressing: Woven Gauze Sponge, Non-Sterile 4x4 in 1 x Per Week/15 Days Discharge Instructions: Apply over primary dressing as directed. Compression Wrap: FourPress (4 layer compression wrap) 1 x Per Week/15 Days Discharge Instructions: Apply four layer compression as directed. Compression Stockings: Circaid Juxta Lite Compression Wrap Left Leg Compression Amount: 30-40 mmHG Discharge Instructions: Apply Circaid Juxta Lite Compression Wrap daily as instructed. Apply first thing in the morning, remove at night before bed. Electronic Signature(s) Signed: 05/22/2020 5:22:30 PM By: Bradley Stokes Signed: 05/22/2020 5:43:17 PM By: [name] RN Entered By: [name] on 05/22/2020  11:19:53 -------------------------------------------------------------------------------- Progress Note Details Patient Name: Date of Service: Bradley Stokes 05/22/2020 10:30 A M Medical Record Number: [id number] Patient Account Number: [id number] Date of Birth/Sex: Treating RN: Mar 07, 1946 (75 y.o. Bradley Stokes Primary Care Provider: Aretta Stokes Other Clinician: Referring Provider: Treating Provider/Extender: Bradley Stokes Weeks in Treatment:  4 Subjective History of Present Illness (HPI) ADMISSION 04/24/2020 Mr. Harewood is now a 75 year old nondiabetic man. He was here in 2010 and again in 2013 he says the last time for a traumatic wound at work in the setting of chronic venous disease and lymphedema. He has stockings at home but hasn't been wearing them for several weeks now. He says his primary provider Dr. Lyndle Stokes sent him here for review of severe lymphedema with secondary skin changes including dry cracked hyperkeratotic vericused areas bilaterally. He has been using ammonium lactate lotion to try and soak off some of the dry flaking skin but really hadn't any open wound per se. He has not been using his compression stockings for 2 or 3 weeks he states because he is using the lotion on his legs and he wasn't sure whether to do both. Past medical history includes paroxysmal atrial fibrillation on Coumadin, hypertension, varicose veins and hyperlipidemia, stage III chronic renal failure, hyperlipidemia and probably osteoarthritis 2/1; severe bilateral lymphedema chronic venous insufficiency. Skin changes of lymphedema chronic xerotic skin. He had open areas on the posterior left calf last time. Arrives in clinic with much better edema control. He also has superficial areas on the right anterior were just noticing this week 2/8; the patient has better edema control. He has chronic xerotic skin and skin changes of lymphedema. He has nothing open on the right  leg the area on the left is still open but looking better. 2/15. He comes in with his juxta lite stocking on the right leg and everything seems fine here. He is using the ammonium lactate lotion. He has 3 areas 2 posteriorly and 1 medially on the left leg we have been using Hydrofera Blue under compression 2/22; juxta lite stockings on the right leg everything is healed here. He is using the ammonium lactate lotion. The only wounds that are open now where the tumor posteriorly. I am not sure about the surface of these in terms of viability but certainly they bleed easily. Using Hydrofera Blue Objective Constitutional Patient is hypertensive.. Pulse regular and within target range for patient.Marland Kitchen Respirations regular, non-labored and within target range.. Temperature is normal and within the target range for the patient.Marland Kitchen Appears in no distress. Vitals Time Taken: 10:48 AM, Height: 69 in, Weight: 275 lbs, BMI: 40.6, Temperature: 97.8 F, Pulse: 91 bpm, Respiratory Rate: 18 breaths/min, Blood Pressure: 163/75 mmHg. Cardiovascular Edema is controlled. General Notes: Wound exam; left leg wounds anteriorly are healed she has 2 areas posteriorly one of them with some size the other is very small. Both of these are quite vascular and bleed easily but the surfaces of the wounds appear reasonable. Integumentary (Hair, Skin) Wound #4 status is Healed - Epithelialized. Original cause of wound was Gradually Appeared. The date acquired was: 04/29/2020. The wound has been in treatment 3 weeks. The wound is located on the Left,Medial Lower Leg. The wound measures 0cm length x 0cm width x 0cm depth; 0cm^2 area and 0cm^3 volume. There is no tunneling or undermining noted. There is a none present amount of drainage noted. The wound margin is distinct with the outline attached to the wound base. There is no granulation within the wound bed. There is no necrotic tissue within the wound bed. Wound #5 status is Open.  Original cause of wound was Gradually Appeared. The date acquired was: 04/29/2020. The wound has been in treatment 3 weeks. The wound is located on the Left,Proximal,Posterior Lower Leg. The wound measures 1.6cm length x 1cm  width x 0.1cm depth; 1.257cm^2 area and 0.126cm^3 volume. There is Fat Layer (Subcutaneous Tissue) exposed. There is no tunneling or undermining noted. There is a medium amount of serosanguineous drainage noted. The wound margin is distinct with the outline attached to the wound base. There is large (67-100%) red, pink granulation within the wound bed. There is no necrotic tissue within the wound bed. Wound #6 status is Open. Original cause of wound was Gradually Appeared. The date acquired was: 04/29/2020. The wound has been in treatment 3 weeks. The wound is located on the Left,Posterior Lower Leg. The wound measures 2.5cm length x 2.2cm width x 0.1cm depth; 4.32cm^2 area and 0.432cm^3 volume. There is Fat Layer (Subcutaneous Tissue) exposed. There is no tunneling or undermining noted. There is a medium amount of serous drainage noted. The wound margin is flat and intact. There is large (67-100%) red granulation within the wound bed. There is no necrotic tissue within the wound bed. Procedures Wound #5 Pre-procedure diagnosis of Wound #5 is a Lymphedema located on the Left,Proximal,Posterior Lower Leg . There was a Four Layer Compression Therapy Procedure by [name], RN. Post procedure Diagnosis Wound #5: Same as Pre-Procedure Wound #6 Pre-procedure diagnosis of Wound #6 is a Lymphedema located on the Left,Posterior Lower Leg . There was a Four Layer Compression Therapy Procedure by [name], RN. Post procedure Diagnosis Wound #6: Same as Pre-Procedure Plan Follow-up Appointments: Return Appointment in 1 week. Bathing/ Shower/ Hygiene: May shower with protection but do not get wound dressing(s) wet. Edema Control - Lymphedema / SCD / Other: Elevate legs  to the level of the heart or above for 30 minutes daily and/or when sitting, a frequency of: Avoid standing for long periods of time. Patient to wear own compression stockings every day. - Wear the juxtalite on the right leg. Apply in the morning and remove at bedtime. Moisturize legs daily. - Also continue using the ammonium lactate as prescribed by your PCP WOUND #5: - Lower Leg Wound Laterality: Left, Posterior, Proximal Cleanser: Soap and Water 1 x Per Week/15 Days Discharge Instructions: May shower and wash wound with dial antibacterial soap and water prior to dressing change. Cleanser: Normal Saline 1 x Per Week/15 Days Discharge Instructions: Cleanse the wound with Normal Saline prior to applying a clean dressing using gauze sponges, not tissue or cotton balls. Peri-Wound Care: Sween Lotion (Moisturizing lotion) 1 x Per Week/15 Days Discharge Instructions: Apply moisturizing lotion as directed Peri-Wound Care: Triamcinolone 15 (g) 1 x Per Week/15 Days Discharge Instructions: Use triamcinolone 15 (g) as directed Prim Dressing: Hydrofera Blue Classic Foam, 2x2 in 1 x Per Week/15 Days ary Discharge Instructions: Moisten with saline prior to applying to wound bed Secondary Dressing: Woven Gauze Sponge, Non-Sterile 4x4 in 1 x Per Week/15 Days Discharge Instructions: Apply over primary dressing as directed. Com pression Wrap: FourPress (4 layer compression wrap) 1 x Per Week/15 Days Discharge Instructions: Apply four layer compression as directed. WOUND #6: - Lower Leg Wound Laterality: Left, Posterior Cleanser: Soap and Water 1 x Per Week/15 Days Discharge Instructions: May shower and wash wound with dial antibacterial soap and water prior to dressing change. Cleanser: Normal Saline 1 x Per Week/15 Days Discharge Instructions: Cleanse the wound with Normal Saline prior to applying a clean dressing using gauze sponges, not tissue or cotton balls. Peri-Wound Care: Sween Lotion (Moisturizing  lotion) 1 x Per Week/15 Days Discharge Instructions: Apply moisturizing lotion as directed Peri-Wound Care: Triamcinolone 15 (g) 1 x Per Week/15 Days Discharge  Instructions: Use triamcinolone 15 (g) as directed Prim Dressing: Hydrofera Blue Classic Foam, 2x2 in 1 x Per Week/15 Days ary Discharge Instructions: Moisten with saline prior to applying to wound bed Secondary Dressing: Woven Gauze Sponge, Non-Sterile 4x4 in 1 x Per Week/15 Days Discharge Instructions: Apply over primary dressing as directed. Com pression Wrap: FourPress (4 layer compression wrap) 1 x Per Week/15 Days Discharge Instructions: Apply four layer compression as directed. Com pression Stockings: Circaid Juxta Lite Compression Wrap Compression Amount: 30-40 mmHg (left) Discharge Instructions: Apply Circaid Juxta Lite Compression Wrap daily as instructed. Apply first thing in the morning, remove at night before bed. 1. Continued with the Hydrofera Blue classic 2. Careful attention to the 2 remaining wounds next time they may need debridement it is hard to tell I did not do this today. Electronic Signature(s) Signed: 05/22/2020 5:22:30 PM By: Bradley Stokes Entered By: Bradley Stokes on 05/22/2020 12:39:27 -------------------------------------------------------------------------------- SuperBill Details Patient Name: Date of Service: JAECOB, LOWDEN 05/22/2020 Medical Record Number: [id number] Patient Account Number: [id number] Date of Birth/Sex: Treating RN: [birth year] (75 y.o. [name], [name] Primary Care Provider: Aretta Stokes Other Clinician: Referring Provider: Treating Provider/Extender: Bradley Stokes Weeks in Treatment: 4 Diagnosis Coding ICD-10 Codes Code Description I89.0 Lymphedema, not elsewhere classified L97.828 Non-pressure chronic ulcer of other part of left lower leg with other specified severity Facility Procedures CPT4 Code: [id number] Description:  (Facility Use Only) [phone] - [place] L[facility] LT LEG Modifier: Quantity: 1 Physician Procedures : CPT4 Code Description Modifier [id number] - WC PHYS LEVEL 3 - EST PT ICD-10 Diagnosis Description I89.0 Lymphedema, not elsewhere classified L97.828 Non-pressure chronic ulcer of other part of left lower leg with other specified severity Quantity: 1 Electronic Signature(s) Signed: 05/22/2020 5:22:30 PM By: Bradley Stokes Entered By: Bradley Stokes on 05/22/2020 12:39:45

## 2020-05-23 NOTE — Progress Notes (Signed)
Bradley Stokes, Bradley Stokes ([id number]) Visit Report for 05/22/2020 Arrival Information Details Patient Name: Date of Service: TRYTON, BODI 05/22/2020 10:30 A M Medical Record Number: [id number] Patient Account Number: [id number] Date of Birth/Sex: Treating RN: 07-Aug-1945 (75 y.o. Bradley Stokes, [name] Primary Care Provider: [name] Other Clinician: Referring Provider: Treating Provider/Extender: [name] in Treatment: 4 Visit Information History Since Last Visit Added or deleted any medications: No Patient Arrived: Ambulatory Any new allergies or adverse reactions: No Arrival Time: 10:45 Had a fall or experienced change in No Accompanied By: self activities of daily living that may affect Transfer Assistance: None risk of falls: Patient Identification Verified: Yes Signs or symptoms of abuse/neglect since last visito No Secondary Verification Process Completed: Yes Hospitalized since last visit: No Patient Requires Transmission-Based Precautions: No Implantable device outside of the clinic excluding No Patient Has Alerts: No cellular tissue based products placed in the center since last visit: Has Dressing in Place as Prescribed: Yes Pain Present Now: No Electronic Signature(s) Signed: 05/23/2020 10:45:54 AM By: [name] Entered By: [name] on 05/22/2020 10:46:26 -------------------------------------------------------------------------------- Compression Therapy Details Patient Name: Date of Service: Bradley Stokes, Bradley Stokes 05/22/2020 10:30 A M Medical Record Number: [id number] Patient Account Number: [id number] Date of Birth/Sex: Treating RN: 1945-10-02 (75 y.o. [name] Primary Care Provider: [name] Other Clinician: Referring Provider: Treating Provider/Extender: [name] in Treatment: 4 Compression Therapy Performed for Wound Assessment: Wound #5  Left,Proximal,Posterior Lower Leg Performed By: Clinician [name], RN Compression Type: Four Layer Post Procedure Diagnosis Same as Pre-procedure Electronic Signature(s) Signed: 05/22/2020 5:43:17 PM By: [name] RN Entered By: [name] on 05/22/2020 11:18:41 -------------------------------------------------------------------------------- Compression Therapy Details Patient Name: Date of Service: Bradley Stokes, Bradley Stokes 05/22/2020 10:30 A M Medical Record Number: [id number] Patient Account Number: [id number] Date of Birth/Sex: Treating RN: 1945/10/28 (75 y.o. [name] Primary Care Provider: [name] Other Clinician: Referring Provider: Treating Provider/Extender: [name] in Treatment: 4 Compression Therapy Performed for Wound Assessment: Wound #6 Left,Posterior Lower Leg Performed By: Clinician [name], RN Compression Type: Four Layer Post Procedure Diagnosis Same as Pre-procedure Electronic Signature(s) Signed: 05/22/2020 5:43:17 PM By: [name] RN Entered By: [name] on 05/22/2020 11:18:41 -------------------------------------------------------------------------------- Encounter Discharge Information Details Patient Name: Date of Service: Bradley Stokes 05/22/2020 10:30 A M Medical Record Number: [id number] Patient Account Number: [id number] Date of Birth/Sex: Treating RN: 1945/11/20 (75 y.o. Bradley Stokes Primary Care Provider: [name] Other Clinician: Referring Provider: Treating Provider/Extender: [name] in Treatment: 4 Encounter Discharge Information Items Discharge Condition: Stable Ambulatory Status: Cane Discharge Destination: Home Transportation: Private Auto Accompanied By: alone Schedule Follow-up Appointment: Yes Clinical Summary of Care: Patient Declined Electronic Signature(s) Signed: 05/22/2020  5:33:10 PM By: Levan Hurst RN, BSN Entered By: Levan Hurst on 05/22/2020 12:53:08 -------------------------------------------------------------------------------- Lower Extremity Assessment Details Patient Name: Date of Service: Bradley Stokes 05/22/2020 10:30 A M Medical Record Number: [id number] Patient Account Number: [id number] Date of Birth/Sex: Treating RN: January 19, 1946 (75 y.o. Bradley Stokes Primary Care Provider: [name] Other Clinician: Referring Provider: Treating Provider/Extender: [name] in Treatment: 4 Edema Assessment Assessed: [Left: No] [Right: No] Edema: [Left: Ye] [Right: s] Calf Left: Right: Point of Measurement: 34 cm From Medial Instep 39.5 cm Ankle Left: Right: Point of Measurement: 11 cm From Medial Instep 25.5 cm Vascular Assessment Pulses: Dorsalis Pedis Palpable: [Left:Yes] Electronic Signature(s)  Signed: 05/22/2020 5:33:10 PM By: Levan Hurst RN, BSN Entered By: Levan Hurst on 05/22/2020 11:06:02 -------------------------------------------------------------------------------- Multi Wound Chart Details Patient Name: Date of Service: Bradley Stokes 05/22/2020 10:30 A M Medical Record Number: [id number] Patient Account Number: [id number] Date of Birth/Sex: Treating RN: 1945/10/05 (75 y.o. Bradley Stokes, [name] Primary Care Provider: [name] Other Clinician: Referring Provider: Treating Provider/Extender: [name] in Treatment: 4 Vital Signs Height(in): 69 Pulse(bpm): 91 Weight(lbs): 275 Blood Pressure(mmHg): 163/75 Body Mass Index(BMI): 41 Temperature(F): 97.8 Respiratory Rate(breaths/min): 18 Photos: [4:No Photos Left, Medial Lower Leg] [5:No Photos Left, Proximal, Posterior Lower Leg Left, Posterior Lower Leg] [6:No Photos] Wound Location: [4:Gradually Appeared] [5:Gradually Appeared] [6:Gradually Appeared] Wounding Event:  [4:Lymphedema] [5:Lymphedema] [6:Lymphedema] Primary Etiology: [4:Cataracts, Lymphedema, Arrhythmia,] [5:Cataracts, Lymphedema, Arrhythmia, Cataracts, Lymphedema, Arrhythmia,] Comorbid History: [4:Hypertension, Myocardial Infarction, Peripheral Venous Disease, Gout, Osteoarthritis 04/29/2020] [5:Hypertension, Myocardial Infarction, Hypertension, Myocardial Infarction, Peripheral Venous Disease, Gout, Osteoarthritis 04/29/2020]  [6:Peripheral Venous Disease, Gout, Osteoarthritis 04/29/2020] Date Acquired: [4:3] [5:3] [6:3] Weeks of Treatment: [4:Healed - Epithelialized] [5:Open] [6:Open] Wound Status: [4:Yes] [5:No] [6:No] Clustered Wound: [4:2] [5:N/A] [6:N/A] Clustered Quantity: [4:0x0x0] [5:1.6x1x0.1] [6:2.5x2.2x0.1] Measurements L x W x D (cm) [4:0] [5:1.257] [6:4.32] A (cm) : rea [4:0] [5:0.126] [6:0.432] Volume (cm) : [4:100.00%] [5:46.60%] [6:81.70%] % Reduction in Area: [4:100.00%] [5:46.60%] [6:81.70%] % Reduction in Volume: [4:Full Thickness Without Exposed] [5:Full Thickness Without Exposed] [6:Full Thickness Without Exposed] Classification: [4:Support Structures None Present] [5:Support Structures Medium] [6:Support Structures Medium] Exudate Amount: [4:N/A] [5:Serosanguineous] [6:Serous] Exudate Type: [4:N/A] [5:red, brown] [6:amber] Exudate Color: [4:Distinct, outline attached] [5:Distinct, outline attached] [6:Flat and Intact] Wound Margin: [4:None Present (0%)] [5:Large (67-100%)] [6:Large (67-100%)] Granulation Amount: [4:N/A] [5:Red, Pink] [6:Red] Granulation Quality: [4:None Present (0%)] [5:None Present (0%)] [6:None Present (0%)] Necrotic Amount: [4:Fascia: No] [5:Fat Layer (Subcutaneous Tissue): Yes Fat Layer (Subcutaneous Tissue): Yes] Exposed Structures: [4:Fat Layer (Subcutaneous Tissue): No Tendon: No Muscle: No Joint: No Bone: No Large (67-100%)] [5:Fascia: No Tendon: No Muscle: No Joint: No Bone: No Small (1-33%)] [6:Fascia: No Tendon: No Muscle: No Joint: No Bone:  No Small (1-33%)] Epithelialization: [4:N/A] [5:Compression Therapy] [6:Compression Therapy] Treatment Notes Electronic Signature(s) Signed: 05/22/2020 5:22:30 PM By: Linton Ham MD Signed: 05/22/2020 5:43:17 PM By: [name] RN Entered By: Linton Ham on 05/22/2020 12:36:48 -------------------------------------------------------------------------------- Multi-Disciplinary Care Plan Details Patient Name: Date of Service: Bradley Stokes 05/22/2020 10:30 A M Medical Record Number: [id number] Patient Account Number: [id number] Date of Birth/Sex: Treating RN: 1946/01/11 (75 y.o. Bradley Stokes, [name] Primary Care Provider: [name] Other Clinician: Referring Provider: Treating Provider/Extender: [name] in Treatment: 4 Active Inactive Wound/Skin Impairment Nursing Diagnoses: Knowledge deficit related to ulceration/compromised skin integrity Goals: Patient will have a decrease in wound volume by X% from date: (specify in notes) Date Initiated: 04/24/2020 Target Resolution Date: 06/01/2020 Goal Status: Active Patient/caregiver will verbalize understanding of skin care regimen Date Initiated: 04/24/2020 Target Resolution Date: 06/01/2020 Goal Status: Active Ulcer/skin breakdown will have a volume reduction of 30% by week 4 Date Initiated: 04/24/2020 Target Resolution Date: 06/01/2020 Goal Status: Active Interventions: Assess patient/caregiver ability to obtain necessary supplies Assess patient/caregiver ability to perform ulcer/skin care regimen upon admission and as needed Assess ulceration(s) every visit Notes: Electronic Signature(s) Signed: 05/22/2020 5:43:17 PM By: [name] RN Entered By: [name] on 05/22/2020 11:20:01 -------------------------------------------------------------------------------- Pain Assessment Details Patient Name: Date of Service: Bradley Stokes 05/22/2020 10:30 A M Medical  Record Number: [id number] Patient Account Number: [id number] Date of Birth/Sex: Treating RN: [birth year] (  75 y.o. Bradley Stokes, [name] Primary Care Provider: [name] Other Clinician: Referring Provider: Treating Provider/Extender: [name] in Treatment: 4 Active Problems Location of Pain Severity and Description of Pain Patient Has Paino No Site Locations Pain Management and Medication Current Pain Management: Electronic Signature(s) Signed: 05/22/2020 5:43:17 PM By: [name] RN Signed: 05/23/2020 10:45:54 AM By: [name] Entered By: [name] on 05/22/2020 10:49:10 -------------------------------------------------------------------------------- Patient/Caregiver Education Details Patient Name: Date of Service: Bradley Stokes 2/22/2022andnbsp10:30 [place] Record Number: [id number] Patient Account Number: [id number] Date of Birth/Gender: Treating RN: [birth year] (75 y.o. [name] Primary Care Physician: [name] Other Clinician: Referring Physician: Treating Physician/Extender: [name] in Treatment: 4 Education Assessment Education Provided To: Patient Education Topics Provided Wound/Skin Impairment: Handouts: Caring for Your Ulcer Methods: Explain/Verbal Responses: State content correctly Electronic Signature(s) Signed: 05/22/2020 5:43:17 PM By: [name] RN Entered By: [name] on 05/22/2020 11:17:56 -------------------------------------------------------------------------------- Wound Assessment Details Patient Name: Date of Service: Bradley Stokes 05/22/2020 10:30 A M Medical Record Number: [id number] Patient Account Number: [id number] Date of Birth/Sex: Treating RN: 08-Jun-1945 (75 y.o. Bradley Stokes, [name] Primary Care Provider: [name] Other Clinician: Referring Provider: Treating Provider/Extender:  [name] in Treatment: 4 Wound Status Wound Number: 4 Primary Lymphedema Etiology: Wound Location: Left, Medial Lower Leg Wound Healed - Epithelialized Wounding Event: Gradually Appeared Status: Date Acquired: 04/29/2020 Comorbid Cataracts, Lymphedema, Arrhythmia, Hypertension, Myocardial Weeks Of Treatment: 3 History: Infarction, Peripheral Venous Disease, Gout, Osteoarthritis Clustered Wound: Yes Wound Measurements Length: (cm) Width: (cm) Depth: (cm) Clustered Quantity: Area: (cm) Volume: (cm) 0 % Reduction in Area: 100% 0 % Reduction in Volume: 100% 0 Epithelialization: Large (67-100%) 2 Tunneling: No 0 Undermining: No 0 Wound Description Classification: Full Thickness Without Exposed Support Structures Wound Margin: Distinct, outline attached Exudate Amount: None Present Foul Odor After Cleansing: No Slough/Fibrino No Wound Bed Granulation Amount: None Present (0%) Exposed Structure Necrotic Amount: None Present (0%) Fascia Exposed: No Fat Layer (Subcutaneous Tissue) Exposed: No Tendon Exposed: No Muscle Exposed: No Joint Exposed: No Bone Exposed: No Electronic Signature(s) Signed: 05/22/2020 5:33:10 PM By: Levan Hurst RN, BSN Signed: 05/22/2020 5:43:17 PM By: [name] RN Entered By: Levan Hurst on 05/22/2020 11:06:26 -------------------------------------------------------------------------------- Wound Assessment Details Patient Name: Date of Service: Bradley Stokes 05/22/2020 10:30 A M Medical Record Number: [id number] Patient Account Number: [id number] Date of Birth/Sex: Treating RN: 1945-10-26 (75 y.o. Bradley Stokes, [name] Primary Care Provider: [name] Other Clinician: Referring Provider: Treating Provider/Extender: [name] in Treatment: 4 Wound Status Wound Number: 5 Primary Lymphedema Etiology: Wound Location: Left, Proximal, Posterior Lower  Leg Wound Open Wounding Event: Gradually Appeared Status: Date Acquired: 04/29/2020 Comorbid Cataracts, Lymphedema, Arrhythmia, Hypertension, Myocardial Weeks Of Treatment: 3 History: Infarction, Peripheral Venous Disease, Gout, Osteoarthritis History: Infarction, Peripheral Venous Disease, Gout, Osteoarthritis Clustered Wound: No Wound Measurements Length: (cm) 1.6 Width: (cm) 1 Depth: (cm) 0.1 Area: (cm) 1.257 Volume: (cm) 0.126 % Reduction in Area: 46.6% % Reduction in Volume: 46.6% Epithelialization: Small (1-33%) Tunneling: No Undermining: No Wound Description Classification: Full Thickness Without Exposed Support Structures Wound Margin: Distinct, outline attached Exudate Amount: Medium Exudate Type: Serosanguineous Exudate Color: red, brown Foul Odor After Cleansing: No Slough/Fibrino Yes Wound Bed Granulation Amount: Large (67-100%) Exposed Structure Granulation Quality: Red, Pink Fascia Exposed: No Necrotic Amount: None Present (0%) Fat Layer (Subcutaneous Tissue) Exposed: Yes Tendon Exposed: No Muscle Exposed: No Joint  Exposed: No Bone Exposed: No Treatment Notes Wound #5 (Lower Leg) Wound Laterality: Left, Posterior, Proximal Cleanser Soap and Water Discharge Instruction: May shower and wash wound with dial antibacterial soap and water prior to dressing change. Normal Saline Discharge Instruction: Cleanse the wound with Normal Saline prior to applying a clean dressing using gauze sponges, not tissue or cotton balls. Peri-Wound Care Sween Lotion (Moisturizing lotion) Discharge Instruction: Apply moisturizing lotion as directed Triamcinolone 15 (g) Discharge Instruction: Use triamcinolone 15 (g) as directed Topical Primary Dressing Hydrofera Blue Classic Foam, 2x2 in Discharge Instruction: Moisten with saline prior to applying to wound bed Secondary Dressing Woven Gauze Sponge, Non-Sterile 4x4 in Discharge Instruction: Apply over primary dressing as  directed. Secured With Compression Wrap FourPress (4 layer compression wrap) Discharge Instruction: Apply four layer compression as directed. Compression Stockings Add-Ons Electronic Signature(s) Signed: 05/22/2020 5:33:10 PM By: Levan Hurst RN, BSN Signed: 05/22/2020 5:43:17 PM By: [name] RN Entered By: Levan Hurst on 05/22/2020 11:06:43 -------------------------------------------------------------------------------- Wound Assessment Details Patient Name: Date of Service: Bradley Stokes, Bradley Stokes 05/22/2020 10:30 A M Medical Record Number: [id number] Patient Account Number: [id number] Date of Birth/Sex: Treating RN: 10-15-1945 (75 y.o. Bradley Stokes, [name] Primary Care Provider: [name] Other Clinician: Referring Provider: Treating Provider/Extender: [name] in Treatment: 4 Wound Status Wound Number: 6 Primary Lymphedema Etiology: Wound Location: Left, Posterior Lower Leg Wound Open Wounding Event: Gradually Appeared Status: Date Acquired: 04/29/2020 Comorbid Cataracts, Lymphedema, Arrhythmia, Hypertension, Myocardial Weeks Of Treatment: 3 History: Infarction, Peripheral Venous Disease, Gout, Osteoarthritis Clustered Wound: No Wound Measurements Length: (cm) 2.5 Width: (cm) 2.2 Depth: (cm) 0.1 Area: (cm) 4.32 Volume: (cm) 0.432 % Reduction in Area: 81.7% % Reduction in Volume: 81.7% Epithelialization: Small (1-33%) Tunneling: No Undermining: No Wound Description Classification: Full Thickness Without Exposed Support Structures Wound Margin: Flat and Intact Exudate Amount: Medium Exudate Type: Serous Exudate Color: amber Foul Odor After Cleansing: No Slough/Fibrino No Wound Bed Granulation Amount: Large (67-100%) Exposed Structure Granulation Quality: Red Fascia Exposed: No Necrotic Amount: None Present (0%) Fat Layer (Subcutaneous Tissue) Exposed: Yes Tendon Exposed: No Muscle Exposed: No Joint  Exposed: No Bone Exposed: No Treatment Notes Wound #6 (Lower Leg) Wound Laterality: Left, Posterior Cleanser Soap and Water Discharge Instruction: May shower and wash wound with dial antibacterial soap and water prior to dressing change. Normal Saline Discharge Instruction: Cleanse the wound with Normal Saline prior to applying a clean dressing using gauze sponges, not tissue or cotton balls. Peri-Wound Care Sween Lotion (Moisturizing lotion) Discharge Instruction: Apply moisturizing lotion as directed Triamcinolone 15 (g) Discharge Instruction: Use triamcinolone 15 (g) as directed Topical Primary Dressing Hydrofera Blue Classic Foam, 2x2 in Discharge Instruction: Moisten with saline prior to applying to wound bed Secondary Dressing Woven Gauze Sponge, Non-Sterile 4x4 in Discharge Instruction: Apply over primary dressing as directed. Secured With Compression Wrap FourPress (4 layer compression wrap) Discharge Instruction: Apply four layer compression as directed. Compression Stockings Circaid Juxta Lite Compression Wrap Quantity: 1 Left Leg Compression Amount: 30-40 mmHg Discharge Instruction: Apply Circaid Juxta Lite Compression Wrap daily as instructed. Apply first thing in the morning, remove at night before bed. Add-Ons Electronic Signature(s) Signed: 05/22/2020 5:33:10 PM By: Levan Hurst RN, BSN Signed: 05/22/2020 5:43:17 PM By: [name] RN Entered By: Levan Hurst on 05/22/2020 11:06:55 -------------------------------------------------------------------------------- Bradley Stokes Details Patient Name: Date of Service: Bradley Stokes 05/22/2020 10:30 A M Medical Record Number: [id number] Patient Account Number: [id number] Date of Birth/Sex: Treating RN: 09/13/1945 (75 y.o. Bradley Stokes, [name]  Primary Care Provider: [name] Other Clinician: Referring Provider: Treating Provider/Extender: Rodell Perna, [name] Weeks in Treatment:  4 Vital Signs Time Taken: 10:48 Temperature (F): 97.8 Height (in): 69 Pulse (bpm): 91 Weight (lbs): 275 Respiratory Rate (breaths/min): 18 Body Mass Index (BMI): 40.6 Blood Pressure (mmHg): 163/75 Reference Range: 80 - 120 mg / dl Electronic Signature(s) Signed: 05/23/2020 10:45:54 AM By: [name] Entered By: [name] on 05/22/2020 10:49:04

## 2020-05-29 ENCOUNTER — Other Ambulatory Visit: Payer: Self-pay

## 2020-05-29 ENCOUNTER — Encounter (HOSPITAL_BASED_OUTPATIENT_CLINIC_OR_DEPARTMENT_OTHER): Payer: Medicare HMO | Attending: Internal Medicine | Admitting: Internal Medicine

## 2020-05-29 DIAGNOSIS — I129 Hypertensive chronic kidney disease with stage 1 through stage 4 chronic kidney disease, or unspecified chronic kidney disease: Secondary | ICD-10-CM | POA: Diagnosis not present

## 2020-05-29 DIAGNOSIS — I480 Paroxysmal atrial fibrillation: Secondary | ICD-10-CM | POA: Insufficient documentation

## 2020-05-29 DIAGNOSIS — L97828 Non-pressure chronic ulcer of other part of left lower leg with other specified severity: Secondary | ICD-10-CM | POA: Insufficient documentation

## 2020-05-29 DIAGNOSIS — I890 Lymphedema, not elsewhere classified: Secondary | ICD-10-CM | POA: Insufficient documentation

## 2020-05-29 DIAGNOSIS — Z7901 Long term (current) use of anticoagulants: Secondary | ICD-10-CM | POA: Diagnosis not present

## 2020-05-29 DIAGNOSIS — N189 Chronic kidney disease, unspecified: Secondary | ICD-10-CM | POA: Insufficient documentation

## 2020-05-29 DIAGNOSIS — L97222 Non-pressure chronic ulcer of left calf with fat layer exposed: Secondary | ICD-10-CM | POA: Diagnosis not present

## 2020-05-29 NOTE — Progress Notes (Signed)
Bradley Stokes, Bradley Stokes ([id number]) Visit Report for 05/29/2020 Arrival Information Details Patient Name: Date of Service: Stokes, Bradley 05/29/2020 12:30 PM Medical Record Number: [id number] Patient Account Number: [id number] Date of Birth/Sex: Treating RN: 06-13-1945 (75 y.o. Bradley Stokes, [name] Primary Care [name]: [name] Other Clinician: Referring Omario Ander: Treating Duaine Radin/Extender: [name] in Treatment: 5 Visit Information History Since Last Visit Added or deleted any medications: No Patient Arrived: Bradley Stokes Any new allergies or adverse reactions: No Arrival Time: 12:58 Had a fall or experienced change in No Accompanied By: self activities of daily living that may affect Transfer Assistance: None risk of falls: Patient Identification Verified: Yes Signs or symptoms of abuse/neglect since last visito No Secondary Verification Process Completed: Yes Hospitalized since last visit: No Patient Requires Transmission-Based Precautions: No Implantable device outside of the clinic excluding No Patient Has Alerts: No cellular tissue based products placed in the center since last visit: Has Dressing in Place as Prescribed: Yes Pain Present Now: No Electronic Signature(s) Signed: 05/29/2020 5:53:33 PM By: [name] Entered By: [name] on 05/29/2020 12:58:56 -------------------------------------------------------------------------------- Compression Therapy Details Patient Name: Date of Service: MAXIMUS, HOFFERT 05/29/2020 12:30 PM Medical Record Number: [id number] Patient Account Number: [id number] Date of Birth/Sex: Treating RN: 13-Aug-1945 (75 y.o. [name] Primary Care Mechel Schutter: [name] Other Clinician: Referring Quana Chamberlain: Treating Kindsey Eblin/Extender: [name] in Treatment: 5 Compression Therapy Performed for Wound Assessment: Wound #5 Left,Proximal,Posterior Lower  Leg Performed By: Clinician [name], RN Compression Type: Four Layer Post Procedure Diagnosis Same as Pre-procedure Electronic Signature(s) Signed: 05/29/2020 5:58:27 PM By: [name] RN Entered By: [name] on 05/29/2020 13:11:41 -------------------------------------------------------------------------------- Compression Therapy Details Patient Name: Date of Service: OZELL, JUHASZ 05/29/2020 12:30 PM Medical Record Number: [id number] Patient Account Number: [id number] Date of Birth/Sex: Treating RN: 1945/04/04 (75 y.o. [name] Primary Care Graceson Nichelson: [name] Other Clinician: Referring Leanza Shepperson: Treating Ashaad Gaertner/Extender: [name] in Treatment: 5 Compression Therapy Performed for Wound Assessment: Wound #6 Left,Posterior Lower Leg Performed By: Clinician [name], RN Compression Type: Four Layer Post Procedure Diagnosis Same as Pre-procedure Electronic Signature(s) Signed: 05/29/2020 5:58:27 PM By: [name] RN Entered By: [name] on 05/29/2020 13:11:41 -------------------------------------------------------------------------------- Encounter Discharge Information Details Patient Name: Date of Service: Bradley Stokes 05/29/2020 12:30 PM Medical Record Number: [id number] Patient Account Number: [id number] Date of Birth/Sex: Treating RN: March 06, 1946 (75 y.o. [name] Primary Care Lakeya Mulka: [name] Other Clinician: Referring Acelyn Basham: Treating Naveya Ellerman/Extender: [name] in Treatment: 5 Encounter Discharge Information Items Discharge Condition: Stable Ambulatory Status: Cane Discharge Destination: Home Transportation: Private Auto Accompanied By: self Schedule Follow-up Appointment: Yes Clinical Summary of Care: Electronic Signature(s) Signed: 05/29/2020 1:49:11 PM By: Deon Pilling Entered By: Deon Pilling  on 05/29/2020 13:48:55 -------------------------------------------------------------------------------- Lower Extremity Assessment Details Patient Name: Date of Service: Bradley, Stokes 05/29/2020 12:30 PM Medical Record Number: [id number] Patient Account Number: [id number] Date of Birth/Sex: Treating RN: Apr 27, 1945 (75 y.o. Bradley Stokes, [name] Primary Care Kaius Daino: [name] Other Clinician: Referring Brenson Hartman: Treating Aleisha Paone/Extender: [name] in Treatment: 5 Edema Assessment Assessed: [Left: Yes] [Right: No] Edema: [Left: Ye] [Right: s] Calf Left: Right: Point of Measurement: 34 cm From Medial Instep 39.5 cm Ankle Left: Right: Point of Measurement: 11 cm From Medial Instep 25.5 cm Vascular Assessment Pulses: Dorsalis Pedis Palpable: [Left:Yes] Posterior Tibial Palpable: [Left:Yes] Electronic Signature(s) Signed: 05/29/2020 5:58:27 PM By:  [name] RN Entered By: [name] on 05/29/2020 13:04:37 -------------------------------------------------------------------------------- Multi Wound Chart Details Patient Name: Date of Service: Bradley, Stokes 05/29/2020 12:30 PM Medical Record Number: [id number] Patient Account Number: [id number] Date of Birth/Sex: Treating RN: 1945-08-29 (75 y.o. Bradley Stokes, [name] Primary Care Shuronda Santino: [name] Other Clinician: Referring Gavon Majano: Treating Chyann Ambrocio/Extender: [name] in Treatment: 5 Vital Signs Height(in): 69 Pulse(bpm): 80 Weight(lbs): 275 Blood Pressure(mmHg): 175/67 Body Mass Index(BMI): 41 Temperature(F): 98.1 Respiratory Rate(breaths/min): 18 Photos: [5:No Photos Left, Proximal, Posterior Lower Leg Left, Posterior Lower Leg] [6:No Photos] [N/A:N/A N/A] Wound Location: [5:Gradually Appeared] [6:Gradually Appeared] [N/A:N/A] Wounding Event: [5:Lymphedema] [6:Lymphedema] [N/A:N/A] Primary Etiology:  [5:Cataracts, Lymphedema, Arrhythmia, Cataracts, Lymphedema, Arrhythmia, N/A] Comorbid History: [5:Hypertension, Myocardial Infarction, Hypertension, Myocardial Infarction, Peripheral Venous Disease, Gout, Osteoarthritis 04/29/2020] [6:Peripheral Venous Disease, Gout, Osteoarthritis 04/29/2020] [N/A:N/A] Date Acquired: [5:4] [6:4] [N/A:N/A] Weeks of Treatment: [5:Open] [6:Open] [N/A:N/A] Wound Status: [5:1x0.5x0.1] [6:2x2.1x0.1] [N/A:N/A] Measurements L x W x D (cm) [5:0.393] [6:3.299] [N/A:N/A] A (cm) : rea [5:0.039] [6:0.33] [N/A:N/A] Volume (cm) : [5:83.30%] [6:86.00%] [N/A:N/A] % Reduction in Area: [5:83.50%] [6:86.00%] [N/A:N/A] % Reduction in Volume: [5:Full Thickness Without Exposed] [6:Full Thickness Without Exposed] [N/A:N/A] Classification: [5:Support Structures Medium] [6:Support Structures Medium] [N/A:N/A] Exudate Amount: [5:Serosanguineous] [6:Serous] [N/A:N/A] Exudate Type: [5:red, brown] [6:amber] [N/A:N/A] Exudate Color: [5:Distinct, outline attached] [6:Flat and Intact] [N/A:N/A] Wound Margin: [5:Large (67-100%)] [6:Large (67-100%)] [N/A:N/A] Granulation Amount: [5:Red, Pink] [6:Red] [N/A:N/A] Granulation Quality: [5:None Present (0%)] [6:None Present (0%)] [N/A:N/A] Necrotic Amount: [5:Fat Layer (Subcutaneous Tissue): Yes Fat Layer (Subcutaneous Tissue): Yes N/A] Exposed Structures: [5:Fascia: No Tendon: No Muscle: No Joint: No Bone: No Medium (34-66%)] [6:Fascia: No Tendon: No Muscle: No Joint: No Bone: No Medium (34-66%)] [N/A:N/A] Epithelialization: [5:Compression Therapy] [6:Compression Therapy] [N/A:N/A] Treatment Notes Electronic Signature(s) Signed: 05/29/2020 5:58:27 PM By: [name] RN Signed: 05/29/2020 5:59:48 PM By: Linton Ham MD Entered By: Linton Ham on 05/29/2020 13:27:28 -------------------------------------------------------------------------------- Multi-Disciplinary Care Plan Details Patient Name: Date of Service: KYEN, TAITE  05/29/2020 12:30 PM Medical Record Number: [id number] Patient Account Number: [id number] Date of Birth/Sex: Treating RN: 12/08/1945 (75 y.o. Bradley Stokes, [name] Primary Care Naarah Borgerding: [name] Other Clinician: Referring Masiah Lewing: Treating Johnmatthew Solorio/Extender: [name] in Treatment: 5 Active Inactive Wound/Skin Impairment Nursing Diagnoses: Knowledge deficit related to ulceration/compromised skin integrity Goals: Patient will have a decrease in wound volume by X% from date: (specify in notes) Date Initiated: 04/24/2020 Target Resolution Date: 06/01/2020 Goal Status: Active Patient/caregiver will verbalize understanding of skin care regimen Date Initiated: 04/24/2020 Target Resolution Date: 06/01/2020 Goal Status: Active Ulcer/skin breakdown will have a volume reduction of 30% by week 4 Date Initiated: 04/24/2020 Target Resolution Date: 06/01/2020 Goal Status: Active Interventions: Assess patient/caregiver ability to obtain necessary supplies Assess patient/caregiver ability to perform ulcer/skin care regimen upon admission and as needed Assess ulceration(s) every visit Notes: Electronic Signature(s) Signed: 05/29/2020 5:58:27 PM By: [name] RN Entered By: [name] on 05/29/2020 13:22:41 -------------------------------------------------------------------------------- Pain Assessment Details Patient Name: Date of Service: Bradley Stokes 05/29/2020 12:30 PM Medical Record Number: [id number] Patient Account Number: [id number] Date of Birth/Sex: Treating RN: 1946/01/28 (75 y.o. [name] Primary Care Kirk Basquez: [name] Other Clinician: Referring Josel Keo: Treating Zyionna Pesce/Extender: [name] in Treatment: 5 Active Problems Location of Pain Severity and Description of Pain Patient Has Paino No Site Locations Pain Management and Medication Current Pain  Management: Electronic Signature(s) Signed: 05/29/2020 5:53:33 PM By: [name] Signed: 05/29/2020 5:58:27 PM By: Hollie Salk  [name] RN Entered By: [name] on 05/29/2020 12:59:23 -------------------------------------------------------------------------------- Patient/Caregiver Education Details Patient Name: Date of Service: JAYLENN, ALTIER 3/1/2022andnbsp12:30 PM Medical Record Number: [id number] Patient Account Number: [id number] Date of Birth/Gender: Treating RN: [birth year] (75 y.o. [name] Primary Care Physician: [name] Other Clinician: Referring Physician: Treating Physician/Extender: [name] in Treatment: 5 Education Assessment Education Provided To: Patient Education Topics Provided Wound/Skin Impairment: Methods: Explain/Verbal Responses: State content correctly Electronic Signature(s) Signed: 05/29/2020 5:58:27 PM By: [name] RN Entered By: [name] on 05/29/2020 13:22:56 -------------------------------------------------------------------------------- Wound Assessment Details Patient Name: Date of Service: CEDARIUS, KERSH 05/29/2020 12:30 PM Medical Record Number: [id number] Patient Account Number: [id number] Date of Birth/Sex: Treating RN: [birth year] (75 y.o. Bradley Stokes, [name] Primary Care Annaliesa Blann: [name] Other Clinician: Referring Gloria Ricardo: Treating Donovyn Guidice/Extender: [name] in Treatment: 5 Wound Status Wound Number: 5 Primary Lymphedema Etiology: Wound Location: Left, Proximal, Posterior Lower Leg Wound Open Wounding Event: Gradually Appeared Status: Date Acquired: 04/29/2020 Comorbid Cataracts, Lymphedema, Arrhythmia, Hypertension, Myocardial Weeks Of Treatment: 4 History: Infarction, Peripheral Venous Disease, Gout, Osteoarthritis Clustered Wound: No Photos Wound Measurements Length: (cm) 1 Width: (cm)  0.5 Depth: (cm) 0.1 Area: (cm) 0.393 Volume: (cm) 0.039 % Reduction in Area: 83.3% % Reduction in Volume: 83.5% Epithelialization: Medium (34-66%) Tunneling: No Undermining: No Wound Description Classification: Full Thickness Without Exposed Support Structures Wound Margin: Distinct, outline attached Exudate Amount: Medium Exudate Type: Serosanguineous Exudate Color: red, brown Foul Odor After Cleansing: No Slough/Fibrino Yes Wound Bed Granulation Amount: Large (67-100%) Exposed Structure Granulation Quality: Red, Pink Fascia Exposed: No Necrotic Amount: None Present (0%) Fat Layer (Subcutaneous Tissue) Exposed: Yes Tendon Exposed: No Muscle Exposed: No Joint Exposed: No Bone Exposed: No Treatment Notes Wound #5 (Lower Leg) Wound Laterality: Left, Posterior, Proximal Cleanser Soap and Water Discharge Instruction: May shower and wash wound with dial antibacterial soap and water prior to dressing change. Normal Saline Discharge Instruction: Cleanse the wound with Normal Saline prior to applying a clean dressing using gauze sponges, not tissue or cotton balls. Peri-Wound Care Sween Lotion (Moisturizing lotion) Discharge Instruction: Apply moisturizing lotion as directed Triamcinolone 15 (g) Discharge Instruction: Use triamcinolone 15 (g) as directed Topical Primary Dressing Hydrofera Blue Classic Foam, 2x2 in Discharge Instruction: Moisten with saline prior to applying to wound bed Secondary Dressing Woven Gauze Sponge, Non-Sterile 4x4 in Discharge Instruction: Apply over primary dressing as directed. Secured With Compression Wrap FourPress (4 layer compression wrap) Discharge Instruction: Apply four layer compression as directed. Compression Stockings Add-Ons Electronic Signature(s) Signed: 05/29/2020 5:53:33 PM By: [name] Signed: 05/29/2020 5:58:27 PM By: [name] RN Entered By: [name] on 05/29/2020  17:28:14 -------------------------------------------------------------------------------- Wound Assessment Details Patient Name: Date of Service: KARRY, CAUSER 05/29/2020 12:30 PM Medical Record Number: [id number] Patient Account Number: [id number] Date of Birth/Sex: Treating RN: [birth year] (75 y.o. Bradley Stokes, [name] Primary Care Almir Botts: [name] Other Clinician: Referring Leiann Sporer: Treating Corleone Biegler/Extender: [name] in Treatment: 5 Wound Status Wound Number: 6 Primary Lymphedema Etiology: Wound Location: Left, Posterior Lower Leg Wound Open Wounding Event: Gradually Appeared Status: Date Acquired: 04/29/2020 Comorbid Cataracts, Lymphedema, Arrhythmia, Hypertension, Myocardial Weeks Of Treatment: 4 History: Infarction, Peripheral Venous Disease, Gout, Osteoarthritis Clustered Wound: No Photos Wound Measurements Length: (cm) 2 Width: (cm) 2.1 Depth: (cm) 0.1 Area: (cm) 3.299 Volume: (cm) 0.33 % Reduction in Area: 86% % Reduction in Volume: 86% Epithelialization: Medium (34-66%) Tunneling: No Undermining: No Wound Description Classification:  Full Thickness Without Exposed Support Structures Wound Margin: Flat and Intact Exudate Amount: Medium Exudate Type: Serous Exudate Color: amber Wound Bed Granulation Amount: Large (67-100%) Granulation Quality: Red Necrotic Amount: None Present (0%) Foul Odor After Cleansing: No Slough/Fibrino No Exposed Structure Fascia Exposed: No Fat Layer (Subcutaneous Tissue) Exposed: Yes Tendon Exposed: No Muscle Exposed: No Joint Exposed: No Bone Exposed: No Treatment Notes Wound #6 (Lower Leg) Wound Laterality: Left, Posterior Cleanser Soap and Water Discharge Instruction: May shower and wash wound with dial antibacterial soap and water prior to dressing change. Normal Saline Discharge Instruction: Cleanse the wound with Normal Saline prior to applying a clean dressing  using gauze sponges, not tissue or cotton balls. Peri-Wound Care Sween Lotion (Moisturizing lotion) Discharge Instruction: Apply moisturizing lotion as directed Triamcinolone 15 (g) Discharge Instruction: Use triamcinolone 15 (g) as directed Topical Primary Dressing Hydrofera Blue Classic Foam, 2x2 in Discharge Instruction: Moisten with saline prior to applying to wound bed Secondary Dressing Woven Gauze Sponge, Non-Sterile 4x4 in Discharge Instruction: Apply over primary dressing as directed. Secured With Compression Wrap FourPress (4 layer compression wrap) Discharge Instruction: Apply four layer compression as directed. Compression Stockings Circaid Juxta Lite Compression Wrap Quantity: 1 Left Leg Compression Amount: 30-40 mmHg Discharge Instruction: Once you heal all your wounds, youll apply Circaid Juxta Lite Compression Wrap daily as instructed. Apply first thing in the morning, remove at night before bed. Add-Ons Electronic Signature(s) Signed: 05/29/2020 5:53:33 PM By: [name] Signed: 05/29/2020 5:58:27 PM By: [name] RN Entered By: [name] on 05/29/2020 17:28:35 -------------------------------------------------------------------------------- Vitals Details Patient Name: Date of Service: JONATHANDAVID, MARLETT 05/29/2020 12:30 PM Medical Record Number: [id number] Patient Account Number: [id number] Date of Birth/Sex: Treating RN: [birth year] (75 y.o. Bradley Stokes, [name] Primary Care Bethanny Toelle: [name] Other Clinician: Referring Theodora Lalanne: Treating Tunya Held/Extender: [name] in Treatment: 5 Vital Signs Time Taken: 12:58 Temperature (F): 98.1 Height (in): 69 Pulse (bpm): 80 Weight (lbs): 275 Respiratory Rate (breaths/min): 18 Body Mass Index (BMI): 40.6 Blood Pressure (mmHg): 175/67 Reference Range: 80 - 120 mg / dl Electronic Signature(s) Signed: 05/29/2020 5:53:33 PM By: [name] Entered  By: [name] on 05/29/2020 12:59:15

## 2020-05-29 NOTE — Progress Notes (Signed)
Bradley Stokes, Bradley Stokes ([id number]) Visit Report for 05/29/2020 HPI Details Patient Name: Date of Service: Bradley Stokes, Bradley Stokes 05/29/2020 12:30 PM Medical Record Number: [id number] Patient Account Number: [id number] Date of Birth/Sex: Treating RN: 07/20/1945 (75 y.o. [name] Primary Care Provider: [name] Other Clinician: Referring Provider: Treating Provider/Extender: [name] in Treatment: 5 History of Present Illness HPI Description: Is aADMISSION 04/24/2020 Bradley Stokes is now a 75 year old nondiabetic man. He was here in 2010 and again in 2013 he says the last time for a traumatic wound at work in the setting of chronic venous disease and lymphedema. He has stockings at home but hasn't been wearing them for several weeks now. He says his primary provider Dr. Lyndle Stokes sent him here for review of severe lymphedema with secondary skin changes including dry cracked hyperkeratotic vericused areas bilaterally. He has been using ammonium lactate lotion to try and soak off some of the dry flaking skin but really hadn't any open wound per se. He has not been using his compression stockings for 2 or 3 weeks he states because he is using the lotion on his legs and he wasn't sure whether to do both. Past medical history includes paroxysmal atrial fibrillation on Coumadin, hypertension, varicose veins and hyperlipidemia, stage III chronic renal failure, hyperlipidemia and probably osteoarthritis 2/1; severe bilateral lymphedema chronic venous insufficiency. Skin changes of lymphedema chronic xerotic skin. He had open areas on the posterior left calf last time. Arrives in clinic with much better edema control. He also has superficial areas on the right anterior were just noticing this week 2/8; the patient has better edema control. He has chronic xerotic skin and skin changes of lymphedema. He has nothing open on the right leg the area on the left is still open  but looking better. 2/15. He comes in with his juxta lite stocking on the right leg and everything seems fine here. He is using the ammonium lactate lotion. He has 3 areas 2 posteriorly and 1 medially on the left leg we have been using Hydrofera Blue under compression 2/22; juxta lite stockings on the right leg everything is healed here. He is using the ammonium lactate lotion. The only wounds that are open now where the tumor posteriorly. I am not sure about the surface of these in terms of viability but certainly they bleed easily. Using Hydrofera Blue 3/1; continuing to wear his juxta lite stocking on the right leg. He has 2 superficial areas on the left posterior lower calf however. We have been using Hydrofera Blue under compression Electronic Signature(s) Signed: 05/29/2020 5:59:48 PM By: Linton Ham MD Entered By: Linton Ham on 05/29/2020 13:28:01 -------------------------------------------------------------------------------- Physical Exam Details Patient Name: Date of Service: Bradley Stokes 05/29/2020 12:30 PM Medical Record Number: [id number] Patient Account Number: [id number] Date of Birth/Sex: Treating RN: 25-Apr-1945 (75 y.o. [name] Primary Care Provider: [name] Other Clinician: Referring Provider: Treating Provider/Extender: [name] in Treatment: 5 Constitutional Patient is hypertensive.Marland Kitchen Respiratory work of breathing is normal. Cardiovascular Fetal pulses are palpable. Edema control is adequate. Integumentary (Hair, Skin) Dry flaking skin in the lower extremity. Skin changes of chronic venous disease and lymphedema. Notes Wound exam; left leg wounds anteriorly are healed still to posteriorly although there are smaller and superficial. Debrided with Anasept and gauze no mechanical debridement was felt to be necessary Electronic Signature(s) Signed: 05/29/2020 5:59:48 PM By: Linton Ham MD Entered By:  Linton Ham on 05/29/2020 13:29:12 --------------------------------------------------------------------------------  Physician Orders Details Patient Name: Date of Service: Bradley Stokes, Bradley Stokes 05/29/2020 12:30 PM Medical Record Number: [id number] Patient Account Number: [id number] Date of Birth/Sex: Treating RN: 05/12/1945 (75 y.o. [name] Primary Care Provider: [name] Other Clinician: Referring Provider: Treating Provider/Extender: [name] in Treatment: 5 Verbal / Phone Orders: No Diagnosis Coding Follow-up Appointments Return Appointment in 1 week. Bathing/ Shower/ Hygiene May shower with protection but do not get wound dressing(s) wet. Edema Control - Lymphedema / SCD / Other Elevate legs to the level of the heart or above for 30 minutes daily and/or when sitting, a frequency of: Avoid standing for long periods of time. Patient to wear own compression stockings every day. - Wear the juxtalite on the right leg. Apply in the morning and remove at bedtime. Moisturize legs daily. - Also continue using the ammonium lactate as prescribed by your PCP Wound Treatment Wound #5 - Lower Leg Wound Laterality: Left, Posterior, Proximal Cleanser: Soap and Water 1 x Per Week/15 Days Discharge Instructions: May shower and wash wound with dial antibacterial soap and water prior to dressing change. Cleanser: Normal Saline 1 x Per Week/15 Days Discharge Instructions: Cleanse the wound with Normal Saline prior to applying a clean dressing using gauze sponges, not tissue or cotton balls. Peri-Wound Care: Sween Lotion (Moisturizing lotion) 1 x Per Week/15 Days Discharge Instructions: Apply moisturizing lotion as directed Peri-Wound Care: Triamcinolone 15 (g) 1 x Per Week/15 Days Discharge Instructions: Use triamcinolone 15 (g) as directed Prim Dressing: Hydrofera Blue Classic Foam, 2x2 in 1 x Per Week/15 Days ary Discharge Instructions:  Moisten with saline prior to applying to wound bed Secondary Dressing: Woven Gauze Sponge, Non-Sterile 4x4 in 1 x Per Week/15 Days Discharge Instructions: Apply over primary dressing as directed. Compression Wrap: FourPress (4 layer compression wrap) 1 x Per Week/15 Days Discharge Instructions: Apply four layer compression as directed. Wound #6 - Lower Leg Wound Laterality: Left, Posterior Cleanser: Soap and Water 1 x Per Week/15 Days Discharge Instructions: May shower and wash wound with dial antibacterial soap and water prior to dressing change. Cleanser: Normal Saline 1 x Per Week/15 Days Discharge Instructions: Cleanse the wound with Normal Saline prior to applying a clean dressing using gauze sponges, not tissue or cotton balls. Peri-Wound Care: Sween Lotion (Moisturizing lotion) 1 x Per Week/15 Days Discharge Instructions: Apply moisturizing lotion as directed Peri-Wound Care: Triamcinolone 15 (g) 1 x Per Week/15 Days Discharge Instructions: Use triamcinolone 15 (g) as directed Prim Dressing: Hydrofera Blue Classic Foam, 2x2 in 1 x Per Week/15 Days ary Discharge Instructions: Moisten with saline prior to applying to wound bed Secondary Dressing: Woven Gauze Sponge, Non-Sterile 4x4 in 1 x Per Week/15 Days Discharge Instructions: Apply over primary dressing as directed. Compression Wrap: FourPress (4 layer compression wrap) 1 x Per Week/15 Days Discharge Instructions: Apply four layer compression as directed. Compression Stockings: Circaid Juxta Lite Compression Wrap Left Leg Compression Amount: 30-40 mmHG Discharge Instructions: Once you heal all your wounds, youll apply Circaid Juxta Lite Compression Wrap daily as instructed. Apply first thing in the morning, remove at night before bed. Electronic Signature(s) Signed: 05/29/2020 5:58:27 PM By: [name] RN Signed: 05/29/2020 5:59:48 PM By: Linton Ham MD Entered By: [name] on 05/29/2020  13:22:26 -------------------------------------------------------------------------------- Problem List Details Patient Name: Date of Service: Bradley Stokes, Bradley Stokes 05/29/2020 12:30 PM Medical Record Number: [id number] Patient Account Number: [id number] Date of Birth/Sex: Treating RN: [birth year] (75 y.o. [name] Primary Care Provider: Radene Ou,  [place] R Other Clinician: Referring Provider: Treating Provider/Extender: Rodell Perna, Flonnie Overman in Treatment: 5 Active Problems ICD-10 Encounter Code Description Active Date MDM Diagnosis I89.0 Lymphedema, not elsewhere classified 04/24/2020 No Yes L97.828 Non-pressure chronic ulcer of other part of left lower leg with other specified 04/24/2020 No Yes severity Inactive Problems ICD-10 Code Description Active Date Inactive Date L97.811 Non-pressure chronic ulcer of other part of right lower leg limited to breakdown of skin 05/01/2020 05/01/2020 Resolved Problems Electronic Signature(s) Signed: 05/29/2020 5:59:48 PM By: Linton Ham MD Entered By: Linton Ham on 05/29/2020 13:24:03 -------------------------------------------------------------------------------- Progress Note Details Patient Name: Date of Service: Bradley Stokes 05/29/2020 12:30 PM Medical Record Number: [id number] Patient Account Number: [id number] Date of Birth/Sex: Treating RN: [birth year] (75 y.o. [name] Primary Care Provider: [name] Other Clinician: Referring Provider: Treating Provider/Extender: [name] in Treatment: 5 Subjective History of Present Illness (HPI) Is aADMISSION 04/24/2020 Mr. Ellender is now a 75 year old nondiabetic man. He was here in 2010 and again in 2013 he says the last time for a traumatic wound at work in the setting of chronic venous disease and lymphedema. He has stockings at home but hasn't been wearing them for several weeks now. He says his primary  provider Dr. Lyndle Stokes sent him here for review of severe lymphedema with secondary skin changes including dry cracked hyperkeratotic vericused areas bilaterally. He has been using ammonium lactate lotion to try and soak off some of the dry flaking skin but really hadn't any open wound per se. He has not been using his compression stockings for 2 or 3 weeks he states because he is using the lotion on his legs and he wasn't sure whether to do both. Past medical history includes paroxysmal atrial fibrillation on Coumadin, hypertension, varicose veins and hyperlipidemia, stage III chronic renal failure, hyperlipidemia and probably osteoarthritis 2/1; severe bilateral lymphedema chronic venous insufficiency. Skin changes of lymphedema chronic xerotic skin. He had open areas on the posterior left calf last time. Arrives in clinic with much better edema control. He also has superficial areas on the right anterior were just noticing this week 2/8; the patient has better edema control. He has chronic xerotic skin and skin changes of lymphedema. He has nothing open on the right leg the area on the left is still open but looking better. 2/15. He comes in with his juxta lite stocking on the right leg and everything seems fine here. He is using the ammonium lactate lotion. He has 3 areas 2 posteriorly and 1 medially on the left leg we have been using Hydrofera Blue under compression 2/22; juxta lite stockings on the right leg everything is healed here. He is using the ammonium lactate lotion. The only wounds that are open now where the tumor posteriorly. I am not sure about the surface of these in terms of viability but certainly they bleed easily. Using Hydrofera Blue 3/1; continuing to wear his juxta lite stocking on the right leg. He has 2 superficial areas on the left posterior lower calf however. We have been using Hydrofera Blue under compression Objective Constitutional Patient is hypertensive.. Vitals Time  Taken: 12:58 PM, Height: 69 in, Weight: 275 lbs, BMI: 40.6, Temperature: 98.1 F, Pulse: 80 bpm, Respiratory Rate: 18 breaths/min, Blood Pressure: 175/67 mmHg. Respiratory work of breathing is normal. Cardiovascular Fetal pulses are palpable. Edema control is adequate. General Notes: Wound exam; left leg wounds anteriorly are healed still to posteriorly although there are smaller and  superficial. Debrided with Anasept and gauze no mechanical debridement was felt to be necessary Integumentary (Hair, Skin) Dry flaking skin in the lower extremity. Skin changes of chronic venous disease and lymphedema. Wound #5 status is Open. Original cause of wound was Gradually Appeared. The date acquired was: 04/29/2020. The wound has been in treatment 4 weeks. The wound is located on the Left,Proximal,Posterior Lower Leg. The wound measures 1cm length x 0.5cm width x 0.1cm depth; 0.393cm^2 area and 0.039cm^3 volume. There is Fat Layer (Subcutaneous Tissue) exposed. There is no tunneling or undermining noted. There is a medium amount of serosanguineous drainage noted. The wound margin is distinct with the outline attached to the wound base. There is large (67-100%) red, pink granulation within the wound bed. There is no necrotic tissue within the wound bed. Wound #6 status is Open. Original cause of wound was Gradually Appeared. The date acquired was: 04/29/2020. The wound has been in treatment 4 weeks. The wound is located on the Left,Posterior Lower Leg. The wound measures 2cm length x 2.1cm width x 0.1cm depth; 3.299cm^2 area and 0.33cm^3 volume. There is Fat Layer (Subcutaneous Tissue) exposed. There is no tunneling or undermining noted. There is a medium amount of serous drainage noted. The wound margin is flat and intact. There is large (67-100%) red granulation within the wound bed. There is no necrotic tissue within the wound bed. 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 There was a Four Layer Compression Therapy Procedure by [name], RN. Post procedure Diagnosis Wound #5: Same as Pre-Procedure Wound #6 Pre-procedure diagnosis of Wound #6 is a Lymphedema located on the Left,Posterior Lower Leg . There was a Four Layer Compression Therapy Procedure by [name], RN. Post procedure Diagnosis Wound #6: Same as Pre-Procedure Plan Follow-up Appointments: Return Appointment in 1 week. Bathing/ Shower/ Hygiene: May shower with protection but do not get wound dressing(s) wet. Edema Control - Lymphedema / SCD / Other: Elevate legs to the level of the heart or above for 30 minutes daily and/or when sitting, a frequency of: Avoid standing for long periods of time. Patient to wear own compression stockings every day. - Wear the juxtalite on the right leg. Apply in the morning and remove at bedtime. Moisturize legs daily. - Also continue using the ammonium lactate as prescribed by your PCP WOUND #5: - Lower Leg Wound Laterality: Left, Posterior, Proximal Cleanser: Soap and Water 1 x Per Week/15 Days Discharge Instructions: May shower and wash wound with dial antibacterial soap and water prior to dressing change. Cleanser: Normal Saline 1 x Per Week/15 Days Discharge Instructions: Cleanse the wound with Normal Saline prior to applying a clean dressing using gauze sponges, not tissue or cotton balls. Peri-Wound Care: Sween Lotion (Moisturizing lotion) 1 x Per Week/15 Days Discharge Instructions: Apply moisturizing lotion as directed Peri-Wound Care: Triamcinolone 15 (g) 1 x Per Week/15 Days Discharge Instructions: Use triamcinolone 15 (g) as directed Prim Dressing: Hydrofera Blue Classic Foam, 2x2 in 1 x Per Week/15  Days ary Discharge Instructions: Moisten with saline prior to applying to wound bed Secondary Dressing: Woven Gauze Sponge, Non-Sterile 4x4 in 1 x Per Week/15 Days Discharge Instructions: Apply over primary dressing as directed. Com pression Wrap: FourPress (4 layer compression wrap) 1 x Per Week/15 Days Discharge Instructions: Apply four layer compression as  directed. WOUND #6: - Lower Leg Wound Laterality: Left, Posterior Cleanser: Soap and Water 1 x Per Week/15 Days Discharge Instructions: May shower and wash wound with dial antibacterial soap and water prior to dressing change. Cleanser: Normal Saline 1 x Per Week/15 Days Discharge Instructions: Cleanse the wound with Normal Saline prior to applying a clean dressing using gauze sponges, not tissue or cotton balls. Peri-Wound Care: Sween Lotion (Moisturizing lotion) 1 x Per Week/15 Days Discharge Instructions: Apply moisturizing lotion as directed Peri-Wound Care: Triamcinolone 15 (g) 1 x Per Week/15 Days Discharge Instructions: Use triamcinolone 15 (g) as directed Prim Dressing: Hydrofera Blue Classic Foam, 2x2 in 1 x Per Week/15 Days ary Discharge Instructions: Moisten with saline prior to applying to wound bed Secondary Dressing: Woven Gauze Sponge, Non-Sterile 4x4 in 1 x Per Week/15 Days Discharge Instructions: Apply over primary dressing as directed. Com pression Wrap: FourPress (4 layer compression wrap) 1 x Per Week/15 Days Discharge Instructions: Apply four layer compression as directed. Com pression Stockings: Circaid Juxta Lite Compression Wrap Compression Amount: 30-40 mmHg (left) Discharge Instructions: Once you heal all your wounds, youll apply Circaid Juxta Lite Compression Wrap daily as instructed. Apply first thing in the morning, remove at night before bed. #1 I am continuing with Hydrofera Blue classic. 2. Triamcinolone to the normal skin with moisturizer 3. Still under 4-layer compression 3. He has a juxta lite  stocking and waiting for when these areas close over. He will require ammonium lactate lotion to the skin in this leg after this closes as well. I believe he has some of this for the right leg already Electronic Signature(s) Signed: 05/29/2020 5:59:48 PM By: Linton Ham MD Entered By: Linton Ham on 05/29/2020 13:31:46 -------------------------------------------------------------------------------- SuperBill Details Patient Name: Date of Service: Bradley Stokes 05/29/2020 Medical Record Number: [id number] Patient Account Number: [id number] Date of Birth/Sex: Treating RN: 1945-09-03 (75 y.o. [name], [name] Primary Care Provider: [name] Other Clinician: Referring Provider: Treating Provider/Extender: [name] in Treatment: 5 Diagnosis Coding ICD-10 Codes Code Description I89.0 Lymphedema, not elsewhere classified L97.828 Non-pressure chronic ulcer of other part of left lower leg with other specified severity Facility Procedures CPT4 Code: [id number] Description: (Facility Use Only) [phone] - [facility] LT LEG Modifier: Quantity: 1 Physician Procedures : CPT4 Code Description Modifier [id number] - WC PHYS LEVEL 3 - EST PT ICD-10 Diagnosis Description I89.0 Lymphedema, not elsewhere classified L97.828 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

## 2020-05-30 DIAGNOSIS — Z7901 Long term (current) use of anticoagulants: Secondary | ICD-10-CM | POA: Diagnosis not present

## 2020-05-30 DIAGNOSIS — I480 Paroxysmal atrial fibrillation: Secondary | ICD-10-CM | POA: Diagnosis not present

## 2020-06-05 ENCOUNTER — Encounter (HOSPITAL_BASED_OUTPATIENT_CLINIC_OR_DEPARTMENT_OTHER): Payer: Medicare HMO | Admitting: Physician Assistant

## 2020-06-05 ENCOUNTER — Other Ambulatory Visit: Payer: Self-pay

## 2020-06-05 DIAGNOSIS — L97228 Non-pressure chronic ulcer of left calf with other specified severity: Secondary | ICD-10-CM | POA: Diagnosis not present

## 2020-06-05 DIAGNOSIS — I129 Hypertensive chronic kidney disease with stage 1 through stage 4 chronic kidney disease, or unspecified chronic kidney disease: Secondary | ICD-10-CM | POA: Diagnosis not present

## 2020-06-05 DIAGNOSIS — I480 Paroxysmal atrial fibrillation: Secondary | ICD-10-CM | POA: Diagnosis not present

## 2020-06-05 DIAGNOSIS — I890 Lymphedema, not elsewhere classified: Secondary | ICD-10-CM | POA: Diagnosis not present

## 2020-06-05 DIAGNOSIS — L97828 Non-pressure chronic ulcer of other part of left lower leg with other specified severity: Secondary | ICD-10-CM | POA: Diagnosis not present

## 2020-06-05 DIAGNOSIS — N189 Chronic kidney disease, unspecified: Secondary | ICD-10-CM | POA: Diagnosis not present

## 2020-06-05 DIAGNOSIS — Z7901 Long term (current) use of anticoagulants: Secondary | ICD-10-CM | POA: Diagnosis not present

## 2020-06-05 NOTE — Progress Notes (Addendum)
Bradley Stokes, Bradley Stokes ([id number]) Visit Report for 06/05/2020 Chief Complaint Document Details Patient Name: Date of Service: Bradley, Stokes 06/05/2020 2:30 PM Medical Record Number: [id number] Patient Account Number: [id number] Date of Birth/Sex: Treating RN: 02-02-1946 (75 y.o. [name] Primary Care Provider: [name] Other Clinician: Referring Provider: Treating Provider/Extender: Luella Cook, Flonnie Overman in Treatment: 6 Information Obtained from: Patient Chief Complaint 04/24/2020; patient is here for review of bilateral lymphedema Electronic Signature(s) Signed: 06/05/2020 2:28:11 PM By: Worthy Keeler PA-C Entered By: Worthy Keeler on 06/05/2020 14:28:11 -------------------------------------------------------------------------------- HPI Details Patient Name: Date of Service: Bradley, Stokes 06/05/2020 2:30 PM Medical Record Number: [id number] Patient Account Number: [id number] Date of Birth/Sex: Treating RN: 21-Feb-1946 (75 y.o. [name] Primary Care Provider: [name] Other Clinician: Referring Provider: Treating Provider/Extender: Cloyd Stagers in Treatment: 6 History of Present Illness HPI Description: Is aADMISSION 04/24/2020 Mr. Hellberg is now a 75 year old nondiabetic man. He was here in 2010 and again in 2013 he says the last time for a traumatic wound at work in the setting of chronic venous disease and lymphedema. He has stockings at home but hasn't been wearing them for several weeks now. He says his primary provider Dr. Lyndle Herrlich sent him here for review of severe lymphedema with secondary skin changes including dry cracked hyperkeratotic vericused areas bilaterally. He has been using ammonium lactate lotion to try and soak off some of the dry flaking skin but really hadn't any open wound per se. He has not been using his compression stockings for 2 or 3 weeks he states because he is using  the lotion on his legs and he wasn't sure whether to do both. Past medical history includes paroxysmal atrial fibrillation on Coumadin, hypertension, varicose veins and hyperlipidemia, stage III chronic renal failure, hyperlipidemia and probably osteoarthritis 2/1; severe bilateral lymphedema chronic venous insufficiency. Skin changes of lymphedema chronic xerotic skin. He had open areas on the posterior left calf last time. Arrives in clinic with much better edema control. He also has superficial areas on the right anterior were just noticing this week 2/8; the patient has better edema control. He has chronic xerotic skin and skin changes of lymphedema. He has nothing open on the right leg the area on the left is still open but looking better. 2/15. He comes in with his juxta lite stocking on the right leg and everything seems fine here. He is using the ammonium lactate lotion. He has 3 areas 2 posteriorly and 1 medially on the left leg we have been using Hydrofera Blue under compression 2/22; juxta lite stockings on the right leg everything is healed here. He is using the ammonium lactate lotion. The only wounds that are open now where the tumor posteriorly. I am not sure about the surface of these in terms of viability but certainly they bleed easily. Using Hydrofera Blue 3/1; continuing to wear his juxta lite stocking on the right leg. He has 2 superficial areas on the left posterior lower calf however. We have been using Hydrofera Blue under compression 06/05/2020 on evaluation today patient appears to be doing much better in regard to his wounds. Fortunately there is no signs of active infection and in fact this appears to be if not healed almost completely healed. There may be just a small area but my main thing that I am concerned about here is that the patient still does seem to have some issues with being  newly healed as far as the wound area is concerned and I am afraid that if we just got  him right into his own compression that he would likely be prone to reopening fairly quickly. Obviously we do not want that. For that reason I think we likely need wrapping for 1 more week. Electronic Signature(s) Signed: 06/05/2020 2:51:19 PM By: Worthy Keeler PA-C Entered By: Worthy Keeler on 06/05/2020 14:51:19 -------------------------------------------------------------------------------- Physical Exam Details Patient Name: Date of Service: Bradley, Stokes 06/05/2020 2:30 PM Medical Record Number: [id number] Patient Account Number: [id number] Date of Birth/Sex: Treating RN: April 25, 1945 (75 y.o. [name] Primary Care Provider: [name] Other Clinician: Referring Provider: Treating Provider/Extender: Otis Brace Weeks in Treatment: 6 Constitutional Obese and well-hydrated in no acute distress. Respiratory normal breathing without difficulty. Psychiatric this patient is able to make decisions and demonstrates good insight into disease process. Alert and Oriented x 3. pleasant and cooperative. Notes Upon inspection patient's wound bed actually showed signs of good epithelization at this time. There does not appear to be any evidence of active infection which is great news. Overall I think he is very close in fact possibly even completely healed at this point but he does still have a little bit of work to do as far as getting this completely toughened up so will not risk reopening with just utilization of the juxta light wraps. Electronic Signature(s) Signed: 06/05/2020 2:51:56 PM By: Worthy Keeler PA-C Entered By: Worthy Keeler on 06/05/2020 14:51:55 -------------------------------------------------------------------------------- Physician Orders Details Patient Name: Date of Service: Bradley, Stokes 06/05/2020 2:30 PM Medical Record Number: [id number] Patient Account Number: [id number] Date of Birth/Sex: Treating RN: January 14, 1946 (75  y.o. [name], [name] Primary Care Provider: [name] Other Clinician: Referring Provider: Treating Provider/Extender: Cloyd Stagers in Treatment: 6 Verbal / Phone Orders: No Diagnosis Coding ICD-10 Coding Code Description I89.0 Lymphedema, not elsewhere classified L97.828 Non-pressure chronic ulcer of other part of left lower leg with other specified severity Follow-up Appointments Return Appointment in 1 week. Bathing/ Shower/ Hygiene May shower with protection but do not get wound dressing(s) wet. Edema Control - Lymphedema / SCD / Other Elevate legs to the level of the heart or above for 30 minutes daily and/or when sitting, a frequency of: Avoid standing for long periods of time. Patient to wear own compression stockings every day. - Wear the juxtalite on the right leg. Apply in the morning and remove at bedtime. Moisturize legs daily. - Also continue using the ammonium lactate as prescribed by your PCP Wound Treatment Wound #6 - Lower Leg Wound Laterality: Left, Posterior Cleanser: Soap and Water 1 x Per Week/15 Days Discharge Instructions: May shower and wash wound with dial antibacterial soap and water prior to dressing change. Cleanser: Normal Saline 1 x Per Week/15 Days Discharge Instructions: Cleanse the wound with Normal Saline prior to applying a clean dressing using gauze sponges, not tissue or cotton balls. Peri-Wound Care: Sween Lotion (Moisturizing lotion) 1 x Per Week/15 Days Discharge Instructions: Apply moisturizing lotion as directed Peri-Wound Care: Triamcinolone 15 (g) 1 x Per Week/15 Days Discharge Instructions: Use triamcinolone 15 (g) as directed Prim Dressing: Hydrofera Blue Classic Foam, 2x2 in 1 x Per Week/15 Days ary Discharge Instructions: Moisten with saline prior to applying to wound bed Secondary Dressing: Woven Gauze Sponge, Non-Sterile 4x4 in 1 x Per Week/15 Days Discharge Instructions: Apply over  primary dressing as directed. Compression Wrap: FourPress (  4 layer compression wrap) 1 x Per Week/15 Days Discharge Instructions: Apply four layer compression as directed. Compression Stockings: Circaid Juxta Lite Compression Wrap Left Leg Compression Amount: 30-40 mmHG Discharge Instructions: Once you heal all your wounds, youll apply Circaid Juxta Lite Compression Wrap daily as instructed. Apply first thing in the morning, remove at night before bed. Electronic Signature(s) Signed: 06/06/2020 8:28:17 AM By: Worthy Keeler PA-C Signed: 06/06/2020 5:36:09 PM By: [name] RN Entered By: [name] on 06/05/2020 14:46:18 -------------------------------------------------------------------------------- Problem List Details Patient Name: Date of Service: TORRELL, KRUTZ 06/05/2020 2:30 PM Medical Record Number: [id number] Patient Account Number: [id number] Date of Birth/Sex: Treating RN: 1945-08-28 (75 y.o. [name], [name] Primary Care Provider: [name] Other Clinician: Referring Provider: Treating Provider/Extender: Cloyd Stagers in Treatment: 6 Active Problems ICD-10 Encounter Code Description Active Date MDM Diagnosis I89.0 Lymphedema, not elsewhere classified 04/24/2020 No Yes L97.828 Non-pressure chronic ulcer of other part of left lower leg with other specified 04/24/2020 No Yes severity Inactive Problems ICD-10 Code Description Active Date Inactive Date L97.811 Non-pressure chronic ulcer of other part of right lower leg limited to breakdown of skin 05/01/2020 05/01/2020 Resolved Problems Electronic Signature(s) Signed: 06/05/2020 2:28:02 PM By: Worthy Keeler PA-C Entered By: Worthy Keeler on 06/05/2020 14:28:01 -------------------------------------------------------------------------------- Progress Note Details Patient Name: Date of Service: [name] 06/05/2020 2:30 PM Medical Record Number: [id number] Patient  Account Number: [id number] Date of Birth/Sex: Treating RN: 19-Nov-1945 (75 y.o. [name] Primary Care Provider: [name] Other Clinician: Referring Provider: Treating Provider/Extender: Cloyd Stagers in Treatment: 6 Subjective Chief Complaint Information obtained from Patient 04/24/2020; patient is here for review of bilateral lymphedema History of Present Illness (HPI) Is aADMISSION 04/24/2020 Mr. Makara is now a 75 year old nondiabetic man. He was here in 2010 and again in 2013 he says the last time for a traumatic wound at work in the setting of chronic venous disease and lymphedema. He has stockings at home but hasn't been wearing them for several weeks now. He says his primary provider Dr. Lyndle Herrlich sent him here for review of severe lymphedema with secondary skin changes including dry cracked hyperkeratotic vericused areas bilaterally. He has been using ammonium lactate lotion to try and soak off some of the dry flaking skin but really hadn't any open wound per se. He has not been using his compression stockings for 2 or 3 weeks he states because he is using the lotion on his legs and he wasn't sure whether to do both. Past medical history includes paroxysmal atrial fibrillation on Coumadin, hypertension, varicose veins and hyperlipidemia, stage III chronic renal failure, hyperlipidemia and probably osteoarthritis 2/1; severe bilateral lymphedema chronic venous insufficiency. Skin changes of lymphedema chronic xerotic skin. He had open areas on the posterior left calf last time. Arrives in clinic with much better edema control. He also has superficial areas on the right anterior were just noticing this week 2/8; the patient has better edema control. He has chronic xerotic skin and skin changes of lymphedema. He has nothing open on the right leg the area on the left is still open but looking better. 2/15. He comes in with his juxta lite stocking  on the right leg and everything seems fine here. He is using the ammonium lactate lotion. He has 3 areas 2 posteriorly and 1 medially on the left leg we have been using Hydrofera Blue under compression 2/22; juxta lite stockings on the  right leg everything is healed here. He is using the ammonium lactate lotion. The only wounds that are open now where the tumor posteriorly. I am not sure about the surface of these in terms of viability but certainly they bleed easily. Using Hydrofera Blue 3/1; continuing to wear his juxta lite stocking on the right leg. He has 2 superficial areas on the left posterior lower calf however. We have been using Hydrofera Blue under compression 06/05/2020 on evaluation today patient appears to be doing much better in regard to his wounds. Fortunately there is no signs of active infection and in fact this appears to be if not healed almost completely healed. There may be just a small area but my main thing that I am concerned about here is that the patient still does seem to have some issues with being newly healed as far as the wound area is concerned and I am afraid that if we just got him right into his own compression that he would likely be prone to reopening fairly quickly. Obviously we do not want that. For that reason I think we likely need wrapping for 1 more week. Objective Constitutional Obese and well-hydrated in no acute distress. Vitals Time Taken: 1:01 PM, Height: 69 in, Source: Stated, Weight: 275 lbs, Source: Stated, BMI: 40.6, Temperature: 98.3 F, Pulse: 74 bpm, Respiratory Rate: 18 breaths/min, Blood Pressure: 194/80 mmHg. Respiratory normal breathing without difficulty. Psychiatric this patient is able to make decisions and demonstrates good insight into disease process. Alert and Oriented x 3. pleasant and cooperative. General Notes: Upon inspection patient's wound bed actually showed signs of good epithelization at this time. There does not appear  to be any evidence of active infection which is great news. Overall I think he is very close in fact possibly even completely healed at this point but he does still have a little bit of work to do as far as getting this completely toughened up so will not risk reopening with just utilization of the juxta light wraps. Integumentary (Hair, Skin) Wound #5 status is Healed - Epithelialized. Original cause of wound was Gradually Appeared. The date acquired was: 04/29/2020. The wound has been in treatment 5 weeks. The wound is located on the Left,Proximal,Posterior Lower Leg. The wound measures 0cm length x 0cm width x 0cm depth; 0cm^2 area and 0cm^3 volume. There is no tunneling or undermining noted. There is a none present amount of drainage noted. The wound margin is distinct with the outline attached to the wound base. There is no granulation within the wound bed. There is no necrotic tissue within the wound bed. Wound #6 status is Open. Original cause of wound was Gradually Appeared. The date acquired was: 04/29/2020. The wound has been in treatment 5 weeks. The wound is located on the Left,Posterior Lower Leg. The wound measures 0.1cm length x 0.1cm width x 0.1cm depth; 0.008cm^2 area and 0.001cm^3 volume. Assessment Active Problems ICD-10 Lymphedema, not elsewhere classified Non-pressure chronic ulcer of other part of left lower leg with other specified severity Procedures Wound #6 Pre-procedure diagnosis of Wound #6 is a Lymphedema located on the Left,Posterior Lower Leg . There was a Four Layer Compression Therapy Procedure by [name], RN. Post procedure Diagnosis Wound #6: Same as Pre-Procedure Plan Follow-up Appointments: Return Appointment in 1 week. Bathing/ Shower/ Hygiene: May shower with protection but do not get wound dressing(s) wet. Edema Control - Lymphedema / SCD / Other: Elevate legs to the level of the heart or above for 30  minutes daily and/or when sitting, a  frequency of: Avoid standing for long periods of time. Patient to wear own compression stockings every day. - Wear the juxtalite on the right leg. Apply in the morning and remove at bedtime. Moisturize legs daily. - Also continue using the ammonium lactate as prescribed by your PCP WOUND #6: - Lower Leg Wound Laterality: Left, Posterior Cleanser: Soap and Water 1 x Per Week/15 Days Discharge Instructions: May shower and wash wound with dial antibacterial soap and water prior to dressing change. Cleanser: Normal Saline 1 x Per Week/15 Days Discharge Instructions: Cleanse the wound with Normal Saline prior to applying a clean dressing using gauze sponges, not tissue or cotton balls. Peri-Wound Care: Sween Lotion (Moisturizing lotion) 1 x Per Week/15 Days Discharge Instructions: Apply moisturizing lotion as directed Peri-Wound Care: Triamcinolone 15 (g) 1 x Per Week/15 Days Discharge Instructions: Use triamcinolone 15 (g) as directed Prim Dressing: Hydrofera Blue Classic Foam, 2x2 in 1 x Per Week/15 Days ary Discharge Instructions: Moisten with saline prior to applying to wound bed Secondary Dressing: Woven Gauze Sponge, Non-Sterile 4x4 in 1 x Per Week/15 Days Discharge Instructions: Apply over primary dressing as directed. Com pression Wrap: FourPress (4 layer compression wrap) 1 x Per Week/15 Days Discharge Instructions: Apply four layer compression as directed. Com pression Stockings: Circaid Juxta Lite Compression Wrap Compression Amount: 30-40 mmHg (left) Discharge Instructions: Once you heal all your wounds, youll apply Circaid Juxta Lite Compression Wrap daily as instructed. Apply first thing in the morning, remove at night before bed. 1. Would recommend currently that we going continue with the wound care measures as before with regard to the [facility] will use this followed by the 4- layer compression wrap. 2. I am also can recommend the patient continue to elevate his legs much  as possible. 3. I would also recommend that the patient continue to monitor for any signs of infection. Obviously if he has any increased pain or otherwise concerns he should let me know as soon as possible. We will see patient back for reevaluation in 1 week here in the clinic. If anything worsens or changes patient will contact our office for additional recommendations. Electronic Signature(s) Signed: 06/05/2020 2:52:19 PM By: Worthy Keeler PA-C Entered By: Worthy Keeler on 06/05/2020 14:52:19 -------------------------------------------------------------------------------- SuperBill Details Patient Name: Date of Service: PATRIC, BUCKHALTER 06/05/2020 Medical Record Number: [id number] Patient Account Number: [id number] Date of Birth/Sex: Treating RN: [birth year] (75 y.o. [name], [name] Primary Care Provider: [name] Other Clinician: Referring Provider: Treating Provider/Extender: Otis Brace Weeks in Treatment: 6 Diagnosis Coding ICD-10 Codes Code Description I89.0 Lymphedema, not elsewhere classified L97.828 Non-pressure chronic ulcer of other part of left lower leg with other specified severity Facility Procedures CPT4 Code: [id number] Description: (Facility Use Only) [id number] - [place] LWR LT LEG Modifier: Quantity: 1 Physician Procedures : CPT4 Code Description Modifier [id number] - WC PHYS LEVEL 3 - EST PT ICD-10 Diagnosis Description I89.0 Lymphedema, not elsewhere classified L97.828 Non-pressure chronic ulcer of other part of left lower leg with other specified severity Quantity: 1 Electronic Signature(s) Signed: 06/05/2020 2:52:30 PM By: Worthy Keeler PA-C Entered By: Worthy Keeler on 06/05/2020 14:52:29

## 2020-06-06 NOTE — Progress Notes (Signed)
TANDY, LEWIN ([id number]) Visit Report for 06/05/2020 Arrival Information Details Patient Name: Date of Service: RAWLEIGH, RODE 06/05/2020 2:30 PM Medical Record Number: [id number] Patient Account Number: [id number] Date of Birth/Sex: Treating RN: [birth year] (75 y.o. [name] Primary Care Khaliya Golinski: [name] Other Clinician: Referring Dameer Speiser: Treating Fred Hammes/Extender: Cloyd Stagers in Treatment: 6 Visit Information History Since Last Visit Added or deleted any medications: No Patient Arrived: [name] Any new allergies or adverse reactions: No Arrival Time: 12:49 Had a fall or experienced change in No Accompanied By: self activities of daily living that may affect Transfer Assistance: None risk of falls: Patient Identification Verified: Yes Signs or symptoms of abuse/neglect since last visito No Secondary Verification Process Completed: Yes Hospitalized since last visit: No Patient Requires Transmission-Based Precautions: No Implantable device outside of the clinic excluding No Patient Has Alerts: No cellular tissue based products placed in the center since last visit: Has Dressing in Place as Prescribed: Yes Has Compression in Place as Prescribed: Yes Pain Present Now: No Electronic Signature(s) Signed: 06/05/2020 6:01:57 PM By: [name] RN, BSN Entered By: [name] on 06/05/2020 13:01:15 -------------------------------------------------------------------------------- Compression Therapy Details Patient Name: Date of Service: [name] 06/05/2020 2:30 PM Medical Record Number: [id number] Patient Account Number: [id number] Date of Birth/Sex: Treating RN: 10/26/1945 (74 y.o. [name] Primary Care Derik Fults: [name] Other Clinician: Referring Herminia Warren: Treating Vastie Douty/Extender: Otis Brace Weeks in Treatment: 6 Compression Therapy Performed for Wound  Assessment: Wound #6 Left,Posterior Lower Leg Performed By: Clinician [name], RN Compression Type: Four Layer Post Procedure Diagnosis Same as Pre-procedure Electronic Signature(s) Signed: 06/06/2020 5:36:09 PM By: [name] RN Entered By: [name] on 06/05/2020 14:46:51 -------------------------------------------------------------------------------- Encounter Discharge Information Details Patient Name: Date of Service: [name] 06/05/2020 2:30 PM Medical Record Number: [id number] Patient Account Number: [id number] Date of Birth/Sex: Treating RN: [birth year] (75 y.o. [name] Primary Care Kingston Shawgo: [name] Other Clinician: Referring Aleera Gilcrease: Treating Angelito Hopping/Extender: Luella Cook, Flonnie Overman in Treatment: 6 Encounter Discharge Information Items Discharge Condition: Stable Ambulatory Status: Cane Discharge Destination: Home Transportation: Private Auto Accompanied By: self Schedule Follow-up Appointment: Yes Clinical Summary of Care: Patient Declined Electronic Signature(s) Signed: 06/05/2020 6:01:57 PM By: [name] RN, BSN Entered By: [name] on 06/05/2020 16:45:26 -------------------------------------------------------------------------------- Lower Extremity Assessment Details Patient Name: Date of Service: KOBYN, KRAY 06/05/2020 2:30 PM Medical Record Number: [id number] Patient Account Number: [id number] Date of Birth/Sex: Treating RN: 1945/10/09 (74 y.o. [name] Primary Care Payslee Bateson: [name] Other Clinician: Referring Chisa Kushner: Treating Shep Porter/Extender: Otis Brace Weeks in Treatment: 6 Edema Assessment Assessed: [Left: No] [Right: No] Edema: [Left: Ye] [Right: s] Calf Left: Right: Point of Measurement: 34 cm From Medial Instep 39.5 cm Ankle Left: Right: Point of Measurement: 11 cm From Medial Instep 24.8 cm Vascular  Assessment Pulses: Dorsalis Pedis Palpable: [Left:Yes] Electronic Signature(s) Signed: 06/05/2020 6:01:57 PM By: [name] RN, BSN Entered By: [name] on 06/05/2020 13:04:33 -------------------------------------------------------------------------------- Multi-Disciplinary Care Plan Details Patient Name: Date of Service: [name] 06/05/2020 2:30 PM Medical Record Number: [id number] Patient Account Number: [id number] Date of Birth/Sex: Treating RN: June 29, 1945 (74 y.o. [name] Primary Care Larcenia Holaday: [name] Other Clinician: Referring Mabelle Mungin: Treating Naim Murtha/Extender: Otis Brace Weeks in Treatment: 6 Active Inactive Wound/Skin Impairment Nursing Diagnoses: Knowledge deficit related to ulceration/compromised skin integrity Goals: Patient will have a  decrease in wound volume by X% from date: (specify in notes) Date Initiated: 04/24/2020 Target Resolution Date: 06/29/2020 Goal Status: Active Patient/caregiver will verbalize understanding of skin care regimen Date Initiated: 04/24/2020 Target Resolution Date: 06/29/2020 Goal Status: Active Ulcer/skin breakdown will have a volume reduction of 30% by week 4 Date Initiated: 04/24/2020 Date Inactivated: 06/05/2020 Target Resolution Date: 06/29/2020 Goal Status: Met Interventions: Assess patient/caregiver ability to obtain necessary supplies Assess patient/caregiver ability to perform ulcer/skin care regimen upon admission and as needed Assess ulceration(s) every visit Notes: Electronic Signature(s) Signed: 06/06/2020 5:36:09 PM By: [name] RN Entered By: [name] on 06/05/2020 14:45:28 -------------------------------------------------------------------------------- Pain Assessment Details Patient Name: Date of Service: [name] 06/05/2020 2:30 PM Medical Record Number: [id number] Patient Account Number: [id number] Date of Birth/Sex: Treating  RN: 1946/03/14 (75 y.o. [name] Primary Care Deloy Archey: [name] Other Clinician: Referring Emran Molzahn: Treating Jillianne Gamino/Extender: Otis Brace Weeks in Treatment: 6 Active Problems Location of Pain Severity and Description of Pain Patient Has Paino No Site Locations Rate the pain. Rate the pain. Current Pain Level: 0 Pain Management and Medication Current Pain Management: Electronic Signature(s) Signed: 06/05/2020 6:01:57 PM By: [name] RN, BSN Entered By: [name] on 06/05/2020 13:03:30 -------------------------------------------------------------------------------- Patient/Caregiver Education Details Patient Name: Date of Service: [name] 3/8/2022andnbsp2:30 PM Medical Record Number: [id number] Patient Account Number: [id number] Date of Birth/Gender: Treating RN: [birth year] (74 y.o. [name] Primary Care Physician: [name] Other Clinician: Referring Physician: Treating Physician/Extender: Luella Cook, Flonnie Overman in Treatment: 6 Education Assessment Education Provided To: Patient Education Topics Provided Wound/Skin Impairment: Methods: Explain/Verbal Responses: State content correctly Electronic Signature(s) Signed: 06/06/2020 5:36:09 PM By: [name] RN Entered By: [name] on 06/05/2020 14:45:45 -------------------------------------------------------------------------------- Wound Assessment Details Patient Name: Date of Service: [name] 06/05/2020 2:30 PM Medical Record Number: [id number] Patient Account Number: [id number] Date of Birth/Sex: Treating RN: 1945-08-16 (75 y.o. [name] Primary Care Roxsana Riding: [name] Other Clinician: Referring Zakary Kimura: Treating Alfons Sulkowski/Extender: Otis Brace Weeks in Treatment: 6 Wound Status Wound Number: 5 Primary Lymphedema Etiology: Wound  Location: Left, Proximal, Posterior Lower Leg Wound Healed - Epithelialized Wounding Event: Gradually Appeared Status: Date Acquired: 04/29/2020 Comorbid Cataracts, Lymphedema, Arrhythmia, Hypertension, Myocardial Weeks Of Treatment: 5 History: Infarction, Peripheral Venous Disease, Gout, Osteoarthritis Clustered Wound: No Wound Measurements Length: (cm) Width: (cm) Depth: (cm) Area: (cm) Volume: (cm) 0 % Reduction in Area: 100% 0 % Reduction in Volume: 100% 0 Epithelialization: Large (67-100%) 0 Tunneling: No 0 Undermining: No Wound Description Classification: Full Thickness Without Exposed Support Structures Wound Margin: Distinct, outline attached Exudate Amount: None Present Foul Odor After Cleansing: No Slough/Fibrino No Wound Bed Granulation Amount: None Present (0%) Exposed Structure Necrotic Amount: None Present (0%) Fascia Exposed: No Fat Layer (Subcutaneous Tissue) Exposed: No Tendon Exposed: No Muscle Exposed: No Joint Exposed: No Bone Exposed: No Electronic Signature(s) Signed: 06/05/2020 6:01:57 PM By: [name] RN, BSN Entered By: [name] on 06/05/2020 13:06:15 -------------------------------------------------------------------------------- Wound Assessment Details Patient Name: Date of Service: [name] 06/05/2020 2:30 PM Medical Record Number: [id number] Patient Account Number: [id number] Date of Birth/Sex: Treating RN: Jun 06, 1945 (74 y.o. [name] Primary Care Ennifer Harston: [name] Other Clinician: Referring Hilja Kintzel: Treating Lorinda Copland/Extender: Otis Brace Weeks in Treatment: 6 Wound Status Wound Number: 6 Primary Etiology: Lymphedema Wound Location: Left, Posterior Lower Leg Wound Status: Open Wounding Event: Gradually Appeared Date Acquired: 04/29/2020  Weeks Of Treatment: 5 Clustered Wound: No Wound Measurements Length: (cm) 0.1 Width: (cm) 0.1 Depth: (cm) 0.1 Area: (cm)  0.008 Volume: (cm) 0.001 % Reduction in Area: 100% % Reduction in Volume: 100% Wound Description Classification: Full Thickness Without Exposed Support Structur es Treatment Notes Wound #6 (Lower Leg) Wound Laterality: Left, Posterior Cleanser Soap and Water Discharge Instruction: May shower and wash wound with dial antibacterial soap and water prior to dressing change. Normal Saline Discharge Instruction: Cleanse the wound with Normal Saline prior to applying a clean dressing using gauze sponges, not tissue or cotton balls. Peri-Wound Care Sween Lotion (Moisturizing lotion) Discharge Instruction: Apply moisturizing lotion as directed Triamcinolone 15 (g) Discharge Instruction: Use triamcinolone 15 (g) as directed Topical Primary Dressing Hydrofera Blue Classic Foam, 2x2 in Discharge Instruction: Moisten with saline prior to applying to wound bed Secondary Dressing Woven Gauze Sponge, Non-Sterile 4x4 in Discharge Instruction: Apply over primary dressing as directed. Secured With Compression Wrap FourPress (4 layer compression wrap) Discharge Instruction: Apply four layer compression as directed. Compression Stockings Circaid Juxta Lite Compression Wrap Quantity: 1 Left Leg Compression Amount: 30-40 mmHg Discharge Instruction: Once you heal all your wounds, youll apply Circaid Juxta Lite Compression Wrap daily as instructed. Apply first thing in the morning, remove at night before bed. Add-Ons Electronic Signature(s) Signed: 06/05/2020 6:01:57 PM By: [name] RN, BSN Entered By: [name] on 06/05/2020 13:05:28 -------------------------------------------------------------------------------- Vitals Details Patient Name: Date of Service: [name] 06/05/2020 2:30 PM Medical Record Number: [id number] Patient Account Number: [id number] Date of Birth/Sex: Treating RN: 01-25-1946 (75 y.o. [name] Primary Care Cherylene Ferrufino: [name] Other  Clinician: Referring Cort Dragoo: Treating Byren Pankow/Extender: Otis Brace Weeks in Treatment: 6 Vital Signs Time Taken: 13:01 Temperature (F): 98.3 Height (in): 69 Pulse (bpm): 74 Source: Stated Respiratory Rate (breaths/min): 18 Weight (lbs): 275 Blood Pressure (mmHg): 194/80 Source: Stated Reference Range: 80 - 120 mg / dl Body Mass Index (BMI): 40.6 Electronic Signature(s) Signed: 06/05/2020 6:01:57 PM By: [name] RN, BSN Entered By: [name] on 06/05/2020 13:02:26

## 2020-06-12 ENCOUNTER — Encounter: Payer: Self-pay | Admitting: Cardiology

## 2020-06-12 ENCOUNTER — Ambulatory Visit: Payer: Medicare HMO | Admitting: Cardiology

## 2020-06-12 ENCOUNTER — Other Ambulatory Visit: Payer: Self-pay

## 2020-06-12 VITALS — BP 160/80 | HR 87 | Ht 69.0 in | Wt 287.0 lb

## 2020-06-12 DIAGNOSIS — N189 Chronic kidney disease, unspecified: Secondary | ICD-10-CM | POA: Diagnosis not present

## 2020-06-12 DIAGNOSIS — I480 Paroxysmal atrial fibrillation: Secondary | ICD-10-CM | POA: Diagnosis not present

## 2020-06-12 DIAGNOSIS — Z7901 Long term (current) use of anticoagulants: Secondary | ICD-10-CM

## 2020-06-12 DIAGNOSIS — Z79899 Other long term (current) drug therapy: Secondary | ICD-10-CM | POA: Diagnosis not present

## 2020-06-12 LAB — COMPREHENSIVE METABOLIC PANEL
ALT: 4 IU/L (ref 0–44)
AST: 13 IU/L (ref 0–40)
Albumin/Globulin Ratio: 1.5 (ref 1.2–2.2)
Albumin: 3.9 g/dL (ref 3.7–4.7)
Alkaline Phosphatase: 148 IU/L — ABNORMAL HIGH (ref 44–121)
BUN/Creatinine Ratio: 9 — ABNORMAL LOW (ref 10–24)
BUN: 18 mg/dL (ref 8–27)
Bilirubin Total: 0.6 mg/dL (ref 0.0–1.2)
CO2: 23 mmol/L (ref 20–29)
Calcium: 9 mg/dL (ref 8.6–10.2)
Chloride: 99 mmol/L (ref 96–106)
Creatinine, Ser: 2.05 mg/dL — ABNORMAL HIGH (ref 0.76–1.27)
Globulin, Total: 2.6 g/dL (ref 1.5–4.5)
Glucose: 96 mg/dL (ref 65–99)
Potassium: 3.8 mmol/L (ref 3.5–5.2)
Sodium: 142 mmol/L (ref 134–144)
Total Protein: 6.5 g/dL (ref 6.0–8.5)
eGFR: 33 mL/min/{1.73_m2} — ABNORMAL LOW (ref >59)

## 2020-06-12 LAB — CBC
Hematocrit: 37.6 % (ref 37.5–51.0)
Hemoglobin: 13.1 g/dL (ref 13.0–17.7)
MCH: 34 pg — ABNORMAL HIGH (ref 26.6–33.0)
MCHC: 34.8 g/dL (ref 31.5–35.7)
MCV: 98 fL — ABNORMAL HIGH (ref 79–97)
Platelets: 289 10*3/uL (ref 150–450)
RBC: 3.85 x10E6/uL — ABNORMAL LOW (ref 4.14–5.80)
RDW: 12.6 % (ref 11.6–15.4)
WBC: 7.7 10*3/uL (ref 3.4–10.8)

## 2020-06-12 LAB — TSH: TSH: 1.45 u[IU]/mL (ref 0.450–4.500)

## 2020-06-12 LAB — T4, FREE: Free T4: 1.35 ng/dL (ref 0.82–1.77)

## 2020-06-12 NOTE — Patient Instructions (Signed)
Medication Instructions:  The current medical regimen is effective;  continue present plan and medications.  *If you need a refill on your cardiac medications before your next appointment, please call your pharmacy*  Lab Work: Please have blood work today (CMP, CBC, TSH, Free T4) If you have labs (blood work) drawn today and your tests are completely normal, you will receive your results only by: Marland Kitchen MyChart Message (if you have MyChart) OR . A paper copy in the mail If you have any lab test that is abnormal or we need to change your treatment, we will call you to review the results.  Testing/Procedures: None  Follow-Up: At [facility], you and your health needs are our priority.  As part of our continuing mission to provide you with exceptional heart care, we have created designated Provider Care Teams.  These Care Teams include your primary Cardiologist (physician) and Advanced Practice Providers (APPs -  Physician Assistants and Nurse Practitioners) who all work together to provide you with the care you need, when you need it.  We recommend signing up for the patient portal called "MyChart".  Sign up information is provided on this After Visit Summary.  MyChart is used to connect with patients for Virtual Visits (Telemedicine).  Patients are able to view lab/test results, encounter notes, upcoming appointments, etc.  Non-urgent messages can be sent to your provider as well.   To learn more about what you can do with MyChart, go to [email or web address].    Your next appointment:   6 month(s)  The format for your next appointment:   In Person  Provider:   Candee Furbish, MD   Thank you for choosing [facility]!!

## 2020-06-12 NOTE — Progress Notes (Signed)
Cardiology Office Note:    Date:  06/12/2020   ID:  Bradley Stokes, DOB 07/01/1945, MRN [id number]  PCP:  [name], MD   [place]  Cardiologist:  Candee Furbish, MD  Advanced Practice Provider:  No care team member to display Electrophysiologist:  None       Referring MD: [name], MD     History of Present Illness:    Bradley Stokes is a 75 y.o. male here for the follow-up of of paroxysmal atrial fibrillation on amiodarone and warfarin with hypertension morbid obesity severe knee arthritis with limping gait.  Very difficult ambulation.  Chronic.  Shortness of breath has been chronic as well.  INR being followed by Dr. Radene Ou.  Former patient of Dr. Sherryl Barters.  Wound care since Feb. Wrapped.   Past Medical History:  Diagnosis Date  . Arthritis    severe arthritis and deformity of his knee's  . Chest pain   . Chronic atrial fibrillation ([place])   . Chronic renal disease, stage III ([place])   . Chronic venous stasis dermatitis   . Edema   . Gout   . History of cardiovascular stress test 01/17/2008   EF- 62%  . Hypercholesterolemia   . Hyperlipidemia   . Hypertension   . Kidney stones   . Long term (current) use of anticoagulants   . Long-term (current) use of anticoagulants   . Obesity   . Osteoarthritis   . PAF (paroxysmal atrial fibrillation) ([place])   . Paroxysmal atrial fibrillation ([facility])   . Secondary hypercoagulable state [facility])     Past Surgical History:  Procedure Laterality Date  . CARDIOLITE STRESS TEST     EF 62%    Current Medications: Current Meds  Medication Sig  . acetaminophen (TYLENOL) 325 MG tablet Take 325 mg by mouth every 6 (six) hours as needed (pain).   Marland Kitchen allopurinol (ZYLOPRIM) 100 MG tablet Take 100 mg by mouth daily.  Marland Kitchen amiodarone (PACERONE) 200 MG tablet 1/2 tablet daily  . ammonium lactate (LAC-HYDRIN) 12 % lotion Apply 1 application topically 2 (two) times daily.  Marland Kitchen atorvastatin  (LIPITOR) 20 MG tablet Take 20 mg by mouth Daily.  . calcitRIOL (ROCALTROL) 0.5 MCG capsule Take 0.5 mcg daily by mouth.   . furosemide (LASIX) 20 MG tablet TAKE 2 TABLETS BY MOUTH TWICE DAILY  . K Phos Mono-Sod Phos Di & Mono (VIRT-PHOS 250 NEUTRAL) 155-852-130 MG TABS Take 1 tablet by mouth 2 (two) times a day.   . potassium chloride SA (KLOR-CON) 20 MEQ tablet TAKE 2 TABLETS BY MOUTH TODAY ONLY(8/27), THEN 1 TABLET DAILY  . warfarin (COUMADIN) 2 MG tablet Take 4 mg by mouth one time only at 6 PM.      Allergies:   Other   Social History   Socioeconomic History  . Marital status: Married    Spouse name: Not on file  . Number of children: Not on file  . Years of education: Not on file  . Highest education level: Not on file  Occupational History  . Not on file  Tobacco Use  . Smoking status: Never Smoker  . Smokeless tobacco: Never Used  Vaping Use  . Vaping Use: Never used  Substance and Sexual Activity  . Alcohol use: No  . Drug use: No  . Sexual activity: Not on file  Other Topics Concern  . Not on file  Social History Narrative  . Not on file   Social  Determinants of Health   Financial Resource Strain: Not on file  Food Insecurity: Not on file  Transportation Needs: Not on file  Physical Activity: Not on file  Stress: Not on file  Social Connections: Not on file     Family History: The patient's family history includes Crohn's disease in his sister; Heart disease in his brother; Heart failure in his mother; Kidney failure in his sister; Pneumonia in his mother.  ROS:   Please see the history of present illness.    No recent falls.  All other systems reviewed and are negative.  EKGs/Labs/Other Studies Reviewed:    The following studies were reviewed today:   Echo Study Conclusions from 12/2014  - Left ventricle: The cavity size was normal. Wall thickness was  normal. Systolic function was normal. The estimated ejection  fraction was in the range of  55% to 60%. - Aortic valve: There was mild regurgitation. - Left atrium: The atrium was moderately dilated  EKG:  EKG is  ordered today.  The ekg ordered today demonstrates sinus rhythm 72 first-degree AV block with PAC, prior sinus rhythm PR interval prior EKG showed first-degree AV block with heart rate of 75 bpm.  Recent Labs: 12/21/2019: ALT 6; BUN 21; Creatinine, Ser 1.95; Hemoglobin 13.7; Platelets 252; Potassium 3.9; Sodium 141; TSH 1.340  Recent Lipid Panel    Component Value Date/Time   CHOL 131 12/21/2019 1155   TRIG 120 12/21/2019 1155   HDL 48 12/21/2019 1155   CHOLHDL 2.7 12/21/2019 1155   CHOLHDL 3 11/22/2013 1111   VLDL 25.0 11/22/2013 1111   LDLCALC 62 12/21/2019 1155     Risk Assessment/Calculations:      Physical Exam:    VS:  BP (!) 160/80 (BP Location: Left Arm, Patient Position: Sitting, Cuff Size: Normal)   Pulse 87   Ht 5\' 9"  (1.753 m)   Wt 287 lb (130.2 kg)   SpO2 97%   BMI 42.38 kg/m     Wt Readings from Last 3 Encounters:  06/12/20 287 lb (130.2 kg)  12/21/19 298 lb 12.8 oz (135.5 kg)  06/10/19 (!) 312 lb (141.5 kg)     GEN:  Well nourished, well developed in no acute distress HEENT: Normal NECK: No JVD; No carotid bruits LYMPHATICS: No lymphadenopathy CARDIAC: RRR, no murmurs, rubs, gallops RESPIRATORY:  Clear to auscultation without rales, wheezing or rhonchi  ABDOMEN: Soft, non-tender, non-distended MUSCULOSKELETAL:  No edema; No deformity  SKIN: Warm and dry NEUROLOGIC:  Alert and oriented x 3 PSYCHIATRIC:  Normal affect   ASSESSMENT:    1. PAF (paroxysmal atrial fibrillation) ([place])   2. High risk medication use   3. Chronic anticoagulation   4. Chronic kidney disease, unspecified CKD stage    PLAN:    In order of problems listed above:  Paroxysmal atrial fibrillation -On antiarrhythmic amiodarone.  Continuing to monitor high risk medication with lab work. Checking labs today.   Chronic anticoagulation -Continuing with  warfarin managed by Dr. Radene Ou.  No obvious bleeding.  Most recent INR 2.9.  First-degree AV block -Chronic.  No syncope.  Being careful with amiodarone utilization.  Morbid obesity -Continue to encourage weight loss but this is very challenging for him.  Ambulates with a cane.  Very difficult. Has lost weight. Chip a holic he says.   Hyperlipidemia -Continues with atorvastatin 20 mg.  No myalgias.  LDL 62 in September.  Chronic kidney disease stage IIIa -Creatinine 1.95, avoid NSAIDs.  Hyperlipidemia -Blood pressure under reasonable  control.  Chronic venous stasis ulcers -Being treated for this at wound care center.  Legs are wrapped currently.   Medication Adjustments/Labs and Tests Ordered: Current medicines are reviewed at length with the patient today.  Concerns regarding medicines are outlined above.  Orders Placed This Encounter  Procedures  . CBC  . Comprehensive metabolic panel  . T4, free  . TSH  . EKG 12-Lead   No orders of the defined types were placed in this encounter.   Patient Instructions  Medication Instructions:  The current medical regimen is effective;  continue present plan and medications.  *If you need a refill on your cardiac medications before your next appointment, please call your pharmacy*  Lab Work: Please have blood work today (CMP, CBC, TSH, Free T4) If you have labs (blood work) drawn today and your tests are completely normal, you will receive your results only by: Marland Kitchen MyChart Message (if you have MyChart) OR . A paper copy in the mail If you have any lab test that is abnormal or we need to change your treatment, we will call you to review the results.  Testing/Procedures: None  Follow-Up: At [facility], you and your health needs are our priority.  As part of our continuing mission to provide you with exceptional heart care, we have created designated Provider Care Teams.  These Care Teams include your primary Cardiologist  (physician) and Advanced Practice Providers (APPs -  Physician Assistants and Nurse Practitioners) who all work together to provide you with the care you need, when you need it.  We recommend signing up for the patient portal called "MyChart".  Sign up information is provided on this After Visit Summary.  MyChart is used to connect with patients for Virtual Visits (Telemedicine).  Patients are able to view lab/test results, encounter notes, upcoming appointments, etc.  Non-urgent messages can be sent to your provider as well.   To learn more about what you can do with MyChart, go to [email or web address].    Your next appointment:   6 month(s)  The format for your next appointment:   In Person  Provider:   Candee Furbish, MD   Thank you for choosing [facility]!!        Signed, Candee Furbish, MD  06/12/2020 11:37 AM    [place]

## 2020-06-19 ENCOUNTER — Other Ambulatory Visit: Payer: Self-pay

## 2020-06-19 ENCOUNTER — Encounter (HOSPITAL_BASED_OUTPATIENT_CLINIC_OR_DEPARTMENT_OTHER): Payer: Medicare HMO | Admitting: Internal Medicine

## 2020-06-19 DIAGNOSIS — L97222 Non-pressure chronic ulcer of left calf with fat layer exposed: Secondary | ICD-10-CM | POA: Diagnosis not present

## 2020-06-19 DIAGNOSIS — L97828 Non-pressure chronic ulcer of other part of left lower leg with other specified severity: Secondary | ICD-10-CM | POA: Diagnosis not present

## 2020-06-19 DIAGNOSIS — N189 Chronic kidney disease, unspecified: Secondary | ICD-10-CM | POA: Diagnosis not present

## 2020-06-19 DIAGNOSIS — I480 Paroxysmal atrial fibrillation: Secondary | ICD-10-CM | POA: Diagnosis not present

## 2020-06-19 DIAGNOSIS — Z7901 Long term (current) use of anticoagulants: Secondary | ICD-10-CM | POA: Diagnosis not present

## 2020-06-19 DIAGNOSIS — I890 Lymphedema, not elsewhere classified: Secondary | ICD-10-CM | POA: Diagnosis not present

## 2020-06-19 DIAGNOSIS — I129 Hypertensive chronic kidney disease with stage 1 through stage 4 chronic kidney disease, or unspecified chronic kidney disease: Secondary | ICD-10-CM | POA: Diagnosis not present

## 2020-06-19 NOTE — Progress Notes (Signed)
Bradley Stokes, Bradley Stokes ([id number]) Visit Report for 06/19/2020 HPI Details Patient Name: Date of Service: Bradley Stokes, Bradley Stokes 06/19/2020 10:30 A M Medical Record Number: [id number] Patient Account Number: [id number] Date of Birth/Sex: Treating RN: 06-20-1945 (75 y.o. [name] Primary Care Provider: [name] Other Clinician: Referring Provider: Treating Provider/Extender: [name] History of Present Illness HPI Description: Is aADMISSION 04/24/2020 Mr. Glace is now a 75 year old nondiabetic man. He was here in 2010 and again in 2013 he says the last time for a traumatic wound at work in the setting of chronic venous disease and lymphedema. He has stockings at home but hasn't been wearing them for several weeks now. He says his primary provider Dr. Lyndle Herrlich sent him here for review of severe lymphedema with secondary skin changes including dry cracked hyperkeratotic vericused areas bilaterally. He has been using ammonium lactate lotion to try and soak off some of the dry flaking skin but really hadn't any open wound per se. He has not been using his compression stockings for 2 or 3 weeks he states because he is using the lotion on his legs and he wasn't sure whether to do both. Past medical history includes paroxysmal atrial fibrillation on Coumadin, hypertension, varicose veins and hyperlipidemia, stage III chronic renal failure, hyperlipidemia and probably osteoarthritis 2/1; severe bilateral lymphedema chronic venous insufficiency. Skin changes of lymphedema chronic xerotic skin. He had open areas on the posterior left calf last time. Arrives in clinic with much better edema control. He also has superficial areas on the right anterior were just noticing this week 2/8; the patient has better edema control. He has chronic xerotic skin and skin changes of lymphedema. He has nothing open on the right leg the area on the left is still open  but looking better. 2/15. He comes in with his juxta lite stocking on the right leg and everything seems fine here. He is using the ammonium lactate lotion. He has 3 areas 2 posteriorly and 1 medially on the left leg we have been using Hydrofera Blue under compression 2/22; juxta lite stockings on the right leg everything is healed here. He is using the ammonium lactate lotion. The only wounds that are open now where the tumor posteriorly. I am not sure about the surface of these in terms of viability but certainly they bleed easily. Using Hydrofera Blue 3/1; continuing to wear his juxta lite stocking on the right leg. He has 2 superficial areas on the left posterior lower calf however. We have been using Hydrofera Blue under compression 06/05/2020 on evaluation today patient appears to be doing much better in regard to his wounds. Fortunately there is no signs of active infection and in fact this appears to be if not healed almost completely healed. There may be just a small area but my main thing that I am concerned about here is that the patient still does seem to have some issues with being newly healed as far as the wound area is concerned and I am afraid that if we just got him right into his own compression that he would likely be prone to reopening fairly quickly. Obviously we do not want that. For that reason I think we likely need wrapping for 1 more week. 3/22; I thought the patient might be healed he has 2 superficial areas remaining. He did not make it in the clinic last week therefore the wrap was on for 2 solid week [illness and his sister]  Electronic Signature(s) Signed: 06/19/2020 5:50:43 PM By: Linton Ham MD Entered By: Linton Ham on 06/19/2020 12:52:44 -------------------------------------------------------------------------------- Physical Exam Details Patient Name: Date of Service: Bradley Stokes, Bradley Stokes 06/19/2020 10:30 A M Medical Record Number: [id number] Patient Account  Number: [id number] Date of Birth/Sex: Treating RN: [birth year] (75 y.o. [name] Primary Care Provider: [name] Other Clinician: Referring Provider: Treating Provider/Extender: [name] Constitutional Patient is hypotensive.. Pulse regular and within target range for patient.Marland Kitchen Respirations regular, non-labored and within target range.. Temperature is normal and within the target range for the patient.Marland Kitchen Appears in no distress. Cardiovascular No pulses are palpable on the lower. Notes Wound exam; 2 small open areas remain. Skin is dry hyperkeratotic and scaly. Severe chronic hemosiderin deposition. His edema control is adequate. Electronic Signature(s) Signed: 06/19/2020 5:50:43 PM By: Linton Ham MD Entered By: Linton Ham on 06/19/2020 12:54:20 -------------------------------------------------------------------------------- Physician Orders Details Patient Name: Date of Service: Bradley Stokes 06/19/2020 10:30 A M Medical Record Number: [id number] Patient Account Number: [id number] Date of Birth/Sex: Treating RN: Dec 09, 1945 (75 y.o. [name] Primary Care Provider: [name] Other Clinician: Referring Provider: Treating Provider/Extender: [name] in Treatment: 8 Verbal / Phone Orders: No Diagnosis Coding Follow-up Appointments Return Appointment in 1 week. Bathing/ Shower/ Hygiene May shower with protection but do not get wound dressing(s) wet. Edema Control - Lymphedema / SCD / Other Lymphedema Pumps. Use Lymphedema pumps on leg(s) 2-3 times a day for 45-60 minutes. If wearing any wraps or hose, do not remove them. Continue exercising as instructed. - Ordering pump today. Elevate legs to the level of the heart or above for 30 minutes daily and/or when sitting, a frequency of: Avoid standing for long periods of time. Patient to wear own  compression stockings every day. - Wear the juxtalite on the right leg. Apply in the morning and remove at bedtime. Moisturize legs daily. - Also continue using the ammonium lactate as prescribed by your PCP Wound Treatment Wound #6 - Lower Leg Wound Laterality: Left, Posterior Cleanser: Soap and Water 1 x Per Week/15 Days Discharge Instructions: May shower and wash wound with dial antibacterial soap and water prior to dressing change. Cleanser: Normal Saline 1 x Per Week/15 Days Discharge Instructions: Cleanse the wound with Normal Saline prior to applying a clean dressing using gauze sponges, not tissue or cotton balls. Peri-Wound Care: Sween Lotion (Moisturizing lotion) 1 x Per Week/15 Days Discharge Instructions: Apply moisturizing lotion as directed Peri-Wound Care: Triamcinolone 15 (g) 1 x Per Week/15 Days Discharge Instructions: Use triamcinolone 15 (g) as directed Prim Dressing: Hydrofera Blue Classic Foam, 2x2 in 1 x Per Week/15 Days ary Discharge Instructions: Moisten with saline prior to applying to wound bed Secondary Dressing: Woven Gauze Sponge, Non-Sterile 4x4 in 1 x Per Week/15 Days Discharge Instructions: Apply over primary dressing as directed. Compression Wrap: FourPress (4 layer compression wrap) 1 x Per Week/15 Days Discharge Instructions: Apply four layer compression as directed. Compression Stockings: Circaid Juxta Lite Compression Wrap Left Leg Compression Amount: 30-40 mmHG Discharge Instructions: Once you heal all your wounds, youll apply Circaid Juxta Lite Compression Wrap daily as instructed. Apply first thing in the morning, remove at night before bed. Electronic Signature(s) Signed: 06/19/2020 5:23:06 PM By: [name] RN Signed: 06/19/2020 5:50:43 PM By: Linton Ham MD Entered By: [name] on 06/19/2020 11:55:43 -------------------------------------------------------------------------------- Problem List Details Patient Name: Date of  Service: Bradley Guise E. 06/19/2020 10:30 A M  Medical Record Number: [id number] Patient Account Number: [id number] Date of Birth/Sex: Treating RN: Jul 21, 1945 (75 y.o. [name], [name] Primary Care Provider: [name] Other Clinician: Referring Provider: Treating Provider/Extender: [name] Active Problems ICD-10 Encounter Code Description Active Date MDM Diagnosis I89.0 Lymphedema, not elsewhere classified 04/24/2020 No Yes L97.828 Non-pressure chronic ulcer of other part of left lower leg with other specified 04/24/2020 No Yes severity Inactive Problems ICD-10 Code Description Active Date Inactive Date L97.811 Non-pressure chronic ulcer of other part of right lower leg limited to breakdown of skin 05/01/2020 05/01/2020 Resolved Problems Electronic Signature(s) Signed: 06/19/2020 5:50:43 PM By: Linton Ham MD Entered By: Linton Ham on 06/19/2020 12:51:55 -------------------------------------------------------------------------------- Progress Note Details Patient Name: Date of Service: Bradley Stokes 06/19/2020 10:30 A M Medical Record Number: [id number] Patient Account Number: [id number] Date of Birth/Sex: Treating RN: 03/06/1946 (75 y.o. [name] Primary Care Provider: [name] Other Clinician: Referring Provider: Treating Provider/Extender: [name] Subjective History of Present Illness (HPI) Is aADMISSION 04/24/2020 Mr. Cromartie is now a 75 year old nondiabetic man. He was here in 2010 and again in 2013 he says the last time for a traumatic wound at work in the setting of chronic venous disease and lymphedema. He has stockings at home but hasn't been wearing them for several weeks now. He says his primary provider Dr. Lyndle Herrlich sent him here for review of severe lymphedema with secondary skin changes including dry cracked hyperkeratotic  vericused areas bilaterally. He has been using ammonium lactate lotion to try and soak off some of the dry flaking skin but really hadn't any open wound per se. He has not been using his compression stockings for 2 or 3 weeks he states because he is using the lotion on his legs and he wasn't sure whether to do both. Past medical history includes paroxysmal atrial fibrillation on Coumadin, hypertension, varicose veins and hyperlipidemia, stage III chronic renal failure, hyperlipidemia and probably osteoarthritis 2/1; severe bilateral lymphedema chronic venous insufficiency. Skin changes of lymphedema chronic xerotic skin. He had open areas on the posterior left calf last time. Arrives in clinic with much better edema control. He also has superficial areas on the right anterior were just noticing this week 2/8; the patient has better edema control. He has chronic xerotic skin and skin changes of lymphedema. He has nothing open on the right leg the area on the left is still open but looking better. 2/15. He comes in with his juxta lite stocking on the right leg and everything seems fine here. He is using the ammonium lactate lotion. He has 3 areas 2 posteriorly and 1 medially on the left leg we have been using Hydrofera Blue under compression 2/22; juxta lite stockings on the right leg everything is healed here. He is using the ammonium lactate lotion. The only wounds that are open now where the tumor posteriorly. I am not sure about the surface of these in terms of viability but certainly they bleed easily. Using Hydrofera Blue 3/1; continuing to wear his juxta lite stocking on the right leg. He has 2 superficial areas on the left posterior lower calf however. We have been using Hydrofera Blue under compression 06/05/2020 on evaluation today patient appears to be doing much better in regard to his wounds. Fortunately there is no signs of active infection and in fact this appears to be if not healed  almost completely healed. There may be just  a small area but my main thing that I am concerned about here is that the patient still does seem to have some issues with being newly healed as far as the wound area is concerned and I am afraid that if we just got him right into his own compression that he would likely be prone to reopening fairly quickly. Obviously we do not want that. For that reason I think we likely need wrapping for 1 more week. 3/22; I thought the patient might be healed he has 2 superficial areas remaining. He did not make it in the clinic last week therefore the wrap was on for 2 solid week [illness and his sister] Objective Constitutional Patient is hypotensive.. Pulse regular and within target range for patient.Marland Kitchen Respirations regular, non-labored and within target range.. Temperature is normal and within the target range for the patient.Marland Kitchen Appears in no distress. Vitals Time Taken: 10:57 AM, Height: 69 in, Weight: 275 lbs, BMI: 40.6, Temperature: 97.9 F, Pulse: 85 bpm, Respiratory Rate: 18 breaths/min, Blood Pressure: 175/93 mmHg. Cardiovascular No pulses are palpable on the lower. General Notes: Wound exam; 2 small open areas remain. Skin is dry hyperkeratotic and scaly. Severe chronic hemosiderin deposition. His edema control is adequate. Integumentary (Hair, Skin) Wound #6 status is Open. Original cause of wound was Gradually Appeared. The date acquired was: 04/29/2020. The wound has been in treatment 7 weeks. The wound is located on the Left,Posterior Lower Leg. The wound measures 0.3cm length x 0.4cm width x 0.1cm depth; 0.094cm^2 area and 0.009cm^3 volume. There is Fat Layer (Subcutaneous Tissue) exposed. There is no tunneling or undermining noted. There is a small amount of serosanguineous drainage noted. The wound margin is flat and intact. There is large (67-100%) red granulation within the wound bed. There is no necrotic tissue within the wound  bed. Assessment Active Problems ICD-10 Lymphedema, not elsewhere classified Non-pressure chronic ulcer of other part of left lower leg with other specified severity Procedures Wound #6 Pre-procedure diagnosis of Wound #6 is a Lymphedema located on the Left,Posterior Lower Leg . There was a Four Layer Compression Therapy Procedure by [name], RN. Post procedure Diagnosis Wound #6: Same as Pre-Procedure Plan Follow-up Appointments: Return Appointment in 1 week. Bathing/ Shower/ Hygiene: May shower with protection but do not get wound dressing(s) wet. Edema Control - Lymphedema / SCD / Other: Lymphedema Pumps. Use Lymphedema pumps on leg(s) 2-3 times a day for 45-60 minutes. If wearing any wraps or hose, do not remove them. Continue exercising as instructed. - Ordering pump today. Elevate legs to the level of the heart or above for 30 minutes daily and/or when sitting, a frequency of: Avoid standing for long periods of time. Patient to wear own compression stockings every day. - Wear the juxtalite on the right leg. Apply in the morning and remove at bedtime. Moisturize legs daily. - Also continue using the ammonium lactate as prescribed by your PCP WOUND #6: - Lower Leg Wound Laterality: Left, Posterior Cleanser: Soap and Water 1 x Per Week/15 Days Discharge Instructions: May shower and wash wound with dial antibacterial soap and water prior to dressing change. Cleanser: Normal Saline 1 x Per Week/15 Days Discharge Instructions: Cleanse the wound with Normal Saline prior to applying a clean dressing using gauze sponges, not tissue or cotton balls. Peri-Wound Care: Sween Lotion (Moisturizing lotion) 1 x Per Week/15 Days Discharge Instructions: Apply moisturizing lotion as directed Peri-Wound Care: Triamcinolone 15 (g) 1 x Per Week/15 Days Discharge Instructions: Use triamcinolone 15 (  g) as directed Prim Dressing: Hydrofera Blue Classic Foam, 2x2 in 1 x Per Week/15  Days ary Discharge Instructions: Moisten with saline prior to applying to wound bed Secondary Dressing: Woven Gauze Sponge, Non-Sterile 4x4 in 1 x Per Week/15 Days Discharge Instructions: Apply over primary dressing as directed. Com pression Wrap: FourPress (4 layer compression wrap) 1 x Per Week/15 Days Discharge Instructions: Apply four layer compression as directed. Com pression Stockings: Circaid Juxta Lite Compression Wrap Compression Amount: 30-40 mmHg (left) Discharge Instructions: Once you heal all your wounds, youll apply Circaid Juxta Lite Compression Wrap daily as instructed. Apply first thing in the morning, remove at night before bed. 1. Continue with Hydrofera Blue 2. Continue with 4-layer compression on the left leg 3. He has his juxta lite on the right. This should be adjusted to 30/40 mmHg compression Electronic Signature(s) Signed: 06/19/2020 5:50:43 PM By: Linton Ham MD Entered By: Linton Ham on 06/19/2020 12:55:51 -------------------------------------------------------------------------------- SuperBill Details Patient Name: Date of Service: Bradley Stokes, Bradley Stokes 06/19/2020 Medical Record Number: [id number] Patient Account Number: [id number] Date of Birth/Sex: Treating RN: Jul 31, 1945 (75 y.o. 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

## 2020-06-20 NOTE — Progress Notes (Signed)
Bradley Stokes, Bradley Stokes ([id number]) Visit Report for 06/19/2020 Arrival Information Details Patient Name: Date of Service: Bradley Stokes, Bradley Stokes 06/19/2020 10:30 A M Medical Record Number: [id number] Patient Account Number: [id number] Date of Birth/Sex: Treating RN: 16-Mar-1946 (75 y.o. Bradley Stokes, Bradley Stokes Primary Care Bradley Stokes: Bradley Stokes Other Clinician: Referring Bradley Stokes: Treating Bradley Stokes/Extender: Bradley Stokes Weeks in Treatment: 8 Visit Information History Since Last Visit Added or deleted any medications: No Patient Arrived: Bradley Stokes Any new allergies or adverse reactions: No Arrival Time: 10:57 Had a fall or experienced change in No Accompanied By: self activities of daily living that may affect Transfer Assistance: None risk of falls: Patient Identification Verified: Yes Signs or symptoms of abuse/neglect since last visito No Secondary Verification Process Completed: Yes Hospitalized since last visit: No Patient Requires Transmission-Based Precautions: No Implantable device outside of the clinic excluding No Patient Has Alerts: No cellular tissue based products placed in the center since last visit: Has Dressing in Place as Prescribed: Yes Pain Present Now: No Electronic Signature(s) Signed: 06/19/2020 12:58:57 PM By: [name] Entered By: [name] on 06/19/2020 10:57:21 -------------------------------------------------------------------------------- Compression Therapy Details Patient Name: Date of Service: Bradley Stokes, Bradley Stokes 06/19/2020 10:30 A M Medical Record Number: [id number] Patient Account Number: [id number] Date of Birth/Sex: Treating RN: 05/02/1945 (75 y.o. Bradley Stokes Primary Care Meily Glowacki: Bradley Stokes Other Clinician: Referring Bradley Stokes: Treating [name]: Bradley Stokes Weeks in Treatment: 8 Compression Therapy Performed for Wound Assessment: Wound #6 Left,Posterior Lower  Leg Performed By: Clinician [name], RN Compression Type: Four Layer Post Procedure Diagnosis Same as Pre-procedure Electronic Signature(s) Signed: 06/19/2020 5:23:06 PM By: [name] RN Entered By: [name] on 06/19/2020 11:48:05 -------------------------------------------------------------------------------- Encounter Discharge Information Details Patient Name: Date of Service: Bradley Stokes 06/19/2020 10:30 A M Medical Record Number: [id number] Patient Account Number: [id number] Date of Birth/Sex: Treating RN: 1945-05-16 (75 y.o. Bradley Stokes Primary Care [name]: Bradley Stokes Other Clinician: Referring [name]: Treating [name]: Bradley Stokes in Treatment: 8 Encounter Discharge Information Items Discharge Condition: Stable Ambulatory Status: [name] Discharge Destination: Home Transportation: Private Auto Schedule Follow-up Appointment: Yes Clinical Summary of Care: Provided on 06/19/2020 Form Type Recipient Paper Patient Patient Electronic Signature(s) Signed: 06/19/2020 12:57:13 PM By: [name] Entered By: [name] on 06/19/2020 12:57:13 -------------------------------------------------------------------------------- Lower Extremity Assessment Details Patient Name: Date of Service: Bradley Stokes 06/19/2020 10:30 A M Medical Record Number: [id number] Patient Account Number: [id number] Date of Birth/Sex: Treating RN: 1945/05/02 (75 y.o. Bradley Stokes Primary Care Mitul Hallowell: Bradley Stokes Other Clinician: Referring Geo Slone: Treating [name]: Bradley Stokes Weeks in Treatment: 8 Edema Assessment Assessed: [Left: No] [Right: No] Edema: [Left: Ye] [Right: s] Calf Left: Right: Point of Measurement: 34 cm From Medial Instep 37.4 cm Ankle Left: Right: Point of Measurement: 11 cm From Medial Instep 23.7 cm Vascular  Assessment Pulses: Dorsalis Pedis Palpable: [Left:Yes] Electronic Signature(s) Signed: 06/19/2020 5:27:17 PM By: [name] RN, BSN Entered By: [name] on 06/19/2020 11:12:14 -------------------------------------------------------------------------------- Multi Wound Chart Details Patient Name: Date of Service: Bradley Stokes 06/19/2020 10:30 A M Medical Record Number: [id number] Patient Account Number: [id number] Date of Birth/Sex: Treating RN: [birth year] (75 y.o. Bradley Stokes Primary Care Jeovany Huitron: Bradley Stokes Other Clinician: Referring Sem Mccaughey: Treating Aviance Cooperwood/Extender: Bradley Stokes Weeks in Treatment: 8 Vital Signs Height(in): 69 Pulse(bpm): 85 Weight(lbs): 275 Blood Pressure(mmHg): 175/93 Body Mass Index(BMI): 41 Temperature(F): 97.9 Respiratory Rate(breaths/min): 18 Photos: [6:No Photos Left, Posterior  Lower Leg] [N/A:N/A N/A] Wound Location: [6:Gradually Appeared] [N/A:N/A] Wounding Event: [6:Lymphedema] [N/A:N/A] Primary Etiology: [6:Cataracts, Lymphedema, Arrhythmia, N/A] Comorbid History: [6:Hypertension, Myocardial Infarction, Peripheral Venous Disease, Gout, Osteoarthritis 04/29/2020] [N/A:N/A] Date Acquired: [6:7] [N/A:N/A] Weeks of Treatment: [6:Open] [N/A:N/A] Wound Status: [6:0.3x0.4x0.1] [N/A:N/A] Measurements L x W x D (cm) [6:0.094] [N/A:N/A] A (cm) : rea [7:8.588] [N/A:N/A] Volume (cm) : [6:99.60%] [N/A:N/A] % Reduction in Area: [6:99.60%] [N/A:N/A] % Reduction in Volume: [6:Full Thickness Without Exposed] [N/A:N/A] Classification: [6:Support Structures Small] [N/A:N/A] Exudate Amount: [6:Serosanguineous] [N/A:N/A] Exudate Type: [6:red, brown] [N/A:N/A] Exudate Color: [6:Flat and Intact] [N/A:N/A] Wound Margin: [6:Large (67-100%)] [N/A:N/A] Granulation Amount: [6:Red] [N/A:N/A] Granulation Quality: [6:None Present (0%)] [N/A:N/A] Necrotic Amount: [6:Fat Layer (Subcutaneous Tissue): Yes  N/A] Exposed Structures: [6:Fascia: No Tendon: No Muscle: No Joint: No Bone: No Small (1-33%)] [N/A:N/A] Epithelialization: [6:Compression Therapy] [N/A:N/A] Treatment Notes Electronic Signature(s) Signed: 06/19/2020 5:23:06 PM By: [name] RN Signed: 06/19/2020 5:50:43 PM By: Linton Ham MD Entered By: Linton Ham on 06/19/2020 12:52:02 -------------------------------------------------------------------------------- Multi-Disciplinary Care Plan Details Patient Name: Date of Service: Bradley Stokes. 06/19/2020 10:30 A M Medical Record Number: [id number] Patient Account Number: [id number] Date of Birth/Sex: Treating RN: 04-08-1945 (75 y.o. Bradley Stokes, Bradley Stokes Primary Care Bora Broner: Bradley Stokes Other Clinician: Referring Evelynn Hench: Treating Terence Bart/Extender: Bradley Stokes Weeks in Treatment: 8 Active Inactive Wound/Skin Impairment Nursing Diagnoses: Knowledge deficit related to ulceration/compromised skin integrity Goals: Patient will have a decrease in wound volume by X% from date: (specify in notes) Date Initiated: 04/24/2020 Target Resolution Date: 06/29/2020 Goal Status: Active Patient/caregiver will verbalize understanding of skin care regimen Date Initiated: 04/24/2020 Target Resolution Date: 06/29/2020 Goal Status: Active Ulcer/skin breakdown will have a volume reduction of 30% by week 4 Date Initiated: 04/24/2020 Date Inactivated: 06/05/2020 Target Resolution Date: 06/29/2020 Goal Status: Met Interventions: Assess patient/caregiver ability to obtain necessary supplies Assess patient/caregiver ability to perform ulcer/skin care regimen upon admission and as needed Assess ulceration(s) every visit Notes: Electronic Signature(s) Signed: 06/19/2020 5:23:06 PM By: [name] RN Entered By: [name] on 06/19/2020 11:54:19 -------------------------------------------------------------------------------- Pain Assessment  Details Patient Name: Date of Service: Bradley Stokes 06/19/2020 10:30 A M Medical Record Number: [id number] Patient Account Number: [id number] Date of Birth/Sex: Treating RN: 01/28/1946 (75 y.o. Bradley Stokes Primary Care Tyree Vandruff: Bradley Stokes Other Clinician: Referring Romie Tay: Treating Onyx Edgley/Extender: Bradley Stokes Weeks in Treatment: 8 Active Problems Location of Pain Severity and Description of Pain Patient Has Paino No Site Locations Rate the pain. Current Pain Level: 0 Pain Management and Medication Current Pain Management: Electronic Signature(s) Signed: 06/19/2020 5:23:06 PM By: [name] RN Signed: 06/19/2020 5:27:17 PM By: [name] RN, BSN Entered By: [name] on 06/19/2020 11:11:19 -------------------------------------------------------------------------------- Patient/Caregiver Education Details Patient Name: Date of Service: Bradley Stokes 3/22/2022andnbsp10:30 [place] Record Number: [id number] Patient Account Number: [id number] Date of Birth/Gender: Treating RN: [birth year] (75 y.o. Bradley Stokes Primary Care Physician: Bradley Stokes Other Clinician: Referring Physician: Treating Physician/Extender: Bradley Stokes in Treatment: 8 Education Assessment Education Provided To: Patient Education Topics Provided Wound/Skin Impairment: Methods: Explain/Verbal Responses: State content correctly Electronic Signature(s) Signed: 06/19/2020 5:23:06 PM By: [name] RN Entered By: [name] on 06/19/2020 11:54:28 -------------------------------------------------------------------------------- Wound Assessment Details Patient Name: Date of Service: Bradley Stokes 06/19/2020 10:30 A M Medical Record Number: [id number] Patient Account Number: [id number] Date of Birth/Sex: Treating RN: 07/04/1945 (75 y.o. Bradley Stokes Primary Care  Monquie Fulgham: Bradley Stokes Other Clinician: Referring Tucker Steedley: Treating  Karrissa Parchment/Extender: Rodell Perna, [name] Weeks in Treatment: 8 Wound Status Wound Number: 6 Primary Lymphedema Etiology: Wound Location: Left, Posterior Lower Leg Wound Open Wounding Event: Gradually Appeared Status: Date Acquired: 04/29/2020 Comorbid Cataracts, Lymphedema, Arrhythmia, Hypertension, Myocardial Weeks Of Treatment: 7 History: Infarction, Peripheral Venous Disease, Gout, Osteoarthritis Clustered Wound: No Photos Wound Measurements Length: (cm) 0.3 Width: (cm) 0.4 Depth: (cm) 0.1 Area: (cm) 0.094 Volume: (cm) 0.009 % Reduction in Area: 99.6% % Reduction in Volume: 99.6% Epithelialization: Small (1-33%) Tunneling: No Undermining: No Wound Description Classification: Full Thickness Without Exposed Support Structures Wound Margin: Flat and Intact Exudate Amount: Small Exudate Type: Serosanguineous Exudate Color: red, brown Foul Odor After Cleansing: No Slough/Fibrino No Wound Bed Granulation Amount: Large (67-100%) Exposed Structure Granulation Quality: Red Fascia Exposed: No Necrotic Amount: None Present (0%) Fat Layer (Subcutaneous Tissue) Exposed: Yes Tendon Exposed: No Muscle Exposed: No Joint Exposed: No Bone Exposed: No Treatment Notes Wound #6 (Lower Leg) Wound Laterality: Left, Posterior Cleanser Soap and Water Discharge Instruction: May shower and wash wound with dial antibacterial soap and water prior to dressing change. Normal Saline Discharge Instruction: Cleanse the wound with Normal Saline prior to applying a clean dressing using gauze sponges, not tissue or cotton balls. Peri-Wound Care Sween Lotion (Moisturizing lotion) Discharge Instruction: Apply moisturizing lotion as directed Triamcinolone 15 (g) Discharge Instruction: Use triamcinolone 15 (g) as directed Topical Primary Dressing Hydrofera Blue Classic Foam, 2x2 in Discharge  Instruction: Moisten with saline prior to applying to wound bed Secondary Dressing Woven Gauze Sponge, Non-Sterile 4x4 in Discharge Instruction: Apply over primary dressing as directed. Secured With Compression Wrap FourPress (4 layer compression wrap) Discharge Instruction: Apply four layer compression as directed. Compression Stockings Circaid Juxta Lite Compression Wrap Quantity: 1 Left Leg Compression Amount: 30-40 mmHg Discharge Instruction: Once you heal all your wounds, youll apply Circaid Juxta Lite Compression Wrap daily as instructed. Apply first thing in the morning, remove at night before bed. Add-Ons Electronic Signature(s) Signed: 06/19/2020 5:27:17 PM By: [name] RN, BSN Signed: 06/20/2020 7:45:09 AM By: [name] Entered By: [name] on 06/19/2020 16:17:36 -------------------------------------------------------------------------------- [place] Details Patient Name: Date of Service: Bradley Stokes. 06/19/2020 10:30 A M Medical Record Number: [id number] Patient Account Number: [id number] Date of Birth/Sex: Treating RN: 1945-10-04 (75 y.o. Bradley Stokes, Bradley Stokes Primary Care Dorotha Hirschi: Bradley Stokes Other Clinician: Referring Karren Newland: Treating Homer Miller/Extender: Bradley Stokes Weeks in Treatment: 8 Vital Signs Time Taken: 10:57 Temperature (F): 97.9 Height (in): 69 Pulse (bpm): 85 Weight (lbs): 275 Respiratory Rate (breaths/min): 18 Body Mass Index (BMI): 40.6 Blood Pressure (mmHg): 175/93 Reference Range: 80 - 120 mg / dl Electronic Signature(s) Signed: 06/19/2020 12:58:57 PM By: [name] Entered By: [name] on 06/19/2020 10:58:21

## 2020-06-25 ENCOUNTER — Encounter (HOSPITAL_BASED_OUTPATIENT_CLINIC_OR_DEPARTMENT_OTHER): Payer: Medicare HMO | Admitting: Internal Medicine

## 2020-06-25 ENCOUNTER — Other Ambulatory Visit: Payer: Self-pay

## 2020-06-25 DIAGNOSIS — L97828 Non-pressure chronic ulcer of other part of left lower leg with other specified severity: Secondary | ICD-10-CM | POA: Diagnosis not present

## 2020-06-25 DIAGNOSIS — I480 Paroxysmal atrial fibrillation: Secondary | ICD-10-CM | POA: Diagnosis not present

## 2020-06-25 DIAGNOSIS — Z7901 Long term (current) use of anticoagulants: Secondary | ICD-10-CM | POA: Diagnosis not present

## 2020-06-25 DIAGNOSIS — I129 Hypertensive chronic kidney disease with stage 1 through stage 4 chronic kidney disease, or unspecified chronic kidney disease: Secondary | ICD-10-CM | POA: Diagnosis not present

## 2020-06-25 DIAGNOSIS — N189 Chronic kidney disease, unspecified: Secondary | ICD-10-CM | POA: Diagnosis not present

## 2020-06-25 DIAGNOSIS — I890 Lymphedema, not elsewhere classified: Secondary | ICD-10-CM | POA: Diagnosis not present

## 2020-06-25 NOTE — Progress Notes (Signed)
PERSHING, SKIDMORE ([id number]) Visit Report for 06/25/2020 [place] Details Patient Name: Date of Service: Bradley Stokes, Bradley Stokes 06/25/2020 Medical Record Number: [id number] Patient Account Number: [id number] Date of Birth/Sex: Treating RN: Jan 08, 1946 (75 y.o. [name] Primary Care Provider: [name] Other Clinician: Referring Provider: Treating Provider/Extender: [name] Diagnosis Coding ICD-10 Codes Code Description I89.0 Lymphedema, not elsewhere classified L97.828 Non-pressure chronic ulcer of other part of left lower leg with other specified severity Facility Procedures CPT4 Code Description Modifier Quantity [id number] (Facility Use Only) [phone] - APPLY [place] 1 Electronic Signature(s) Signed: 06/25/2020 5:14:32 PM By: [name] RN Signed: 06/25/2020 5:22:38 PM By: Linton Ham MD Entered By: [name] on 06/25/2020 12:12:11

## 2020-06-25 NOTE — Progress Notes (Signed)
Bradley Stokes ([id number]) Visit Report for 06/25/2020 Arrival Information Details Patient Name: Date of Service: Bradley Stokes 06/25/2020 11:00 A M Medical Record Number: [id number] Patient Account Number: [id number] Date of Birth/Sex: Treating RN: Bradley Stokes (75 y.o. Bradley Stokes, Bradley Stokes Primary Care Demauri Advincula: [name] Other Clinician: Referring Delisia Mcquiston: Treating Wilena Tyndall/Extender: [name] Visit Information History Since Last Visit Added or deleted any medications: No Patient Arrived: Bradley Stokes Any new allergies or adverse reactions: No Arrival Time: 11:45 Had a fall or experienced change in No Accompanied By: self activities of daily living that Bradley affect Transfer Assistance: None risk of falls: Patient Identification Verified: Yes Signs or symptoms of abuse/neglect since last visito No Secondary Verification Process Completed: Yes Hospitalized since last visit: No Patient Requires Transmission-Based Precautions: No Implantable device outside of the clinic excluding No Patient Has Alerts: No cellular tissue based products placed in the center since last visit: Has Dressing in Place as Prescribed: Yes Has Compression in Place as Prescribed: Yes Pain Present Now: No Electronic Signature(s) Signed: 06/25/2020 5:14:32 PM By: [name] RN Entered By: [name] on 06/25/2020 11:46:26 -------------------------------------------------------------------------------- Compression Therapy Details Patient Name: Date of Service: Bradley Stokes 06/25/2020 11:00 A M Medical Record Number: [id number] Patient Account Number: [id number] Date of Birth/Sex: Treating RN: [birth year] (75 y.o. Bradley Stokes Primary Care Bernie Fobes: [name] Other Clinician: Referring Alaena Strader: Treating Marytza Grandpre/Extender: [name] Compression Therapy Performed for  Wound Assessment: Wound #6 Left,Posterior Lower Leg Performed By: Clinician [name], RN Compression Type: Four Layer Electronic Signature(s) Signed: 06/25/2020 5:14:32 PM By: [name] RN Entered By: [name] on 06/25/2020 12:11:24 -------------------------------------------------------------------------------- Encounter Discharge Information Details Patient Name: Date of Service: Bradley Stokes 06/25/2020 11:00 A M Medical Record Number: [id number] Patient Account Number: [id number] Date of Birth/Sex: Treating RN: [birth year] (75 y.o. Bradley Stokes Primary Care Tyeshia Cornforth: [name] Other Clinician: Referring [place] Festa: Treating Jantzen Pilger/Extender: [name] in Treatment: 8 Encounter Discharge Information Items Discharge Condition: Stable Ambulatory Status: [place] Discharge Destination: Home Transportation: Private Auto Accompanied By: self Schedule Follow-up Appointment: Yes Clinical Summary of Care: Patient Declined Electronic Signature(s) Signed: 06/25/2020 5:14:32 PM By: [name] RN Entered By: [name] on 06/25/2020 12:12:02 -------------------------------------------------------------------------------- Lower Extremity Assessment Details Patient Name: Date of Service: Bradley Stokes 06/25/2020 11:00 A M Medical Record Number: [id number] Patient Account Number: [id number] Date of Birth/Sex: Treating RN: [birth year] (75 y.o. Bradley Stokes, Bradley Stokes Primary Care Valoria Tamburri: [name] Other Clinician: Referring Adaliz Dobis: Treating [name]/Extender: [name] Edema Assessment Assessed: [Left: Yes] [Right: No] Edema: [Left: Ye] [Right: s] Calf Left: Right: Point of Measurement: 34 cm From Medial Instep 37.4 cm Ankle Left: Right: Point of Measurement: 11 cm From Medial Instep 23.7 cm Vascular Assessment Pulses: Dorsalis  Pedis Palpable: [Left:Yes] Posterior Tibial Palpable: [Left:Yes] Electronic Signature(s) Signed: 06/25/2020 5:14:32 PM By: [name] RN Entered By: [name] on 06/25/2020 11:46:45 -------------------------------------------------------------------------------- Pain Assessment Details Patient Name: Date of Service: Bradley Stokes 06/25/2020 11:00 A M Medical Record Number: [id number] Patient Account Number: [id number] Date of Birth/Sex: Treating RN: [birth year] (75 y.o. Bradley Stokes Primary Care Kimber Fritts: [name] Other Clinician: Referring Jacody Beneke: Treating [name]/Extender: [name] Active Problems Location of Pain Severity and Description of Pain Patient Has Paino No Site Locations Rate the pain. Current Pain Level: 0 Pain  Management and Medication Current Pain Management: Electronic Signature(s) Signed: 06/25/2020 5:14:32 PM By: [name] RN Entered By: [name] on 06/25/2020 11:46:36 -------------------------------------------------------------------------------- Patient/Caregiver Education Details Patient Name: Date of Service: [name], [name] 3/28/2022andnbsp11:00 [place] Record Number: [id number] Patient Account Number: [id number] Date of Birth/Gender: Treating RN: [birth year] (75 y.o. Bradley Stokes Primary Care Physician: [name] Other Clinician: Referring Physician: Treating Physician/Extender: [name] in Treatment: 8 Education Assessment Education Provided To: Patient Education Topics Provided Wound/Skin Impairment: Methods: Explain/Verbal Responses: State content correctly Electronic Signature(s) Signed: 06/25/2020 5:14:32 PM By: [name] RN Entered By: [name] on 06/25/2020 12:11:52 -------------------------------------------------------------------------------- Wound  Assessment Details Patient Name: Date of Service: Bradley Stokes 06/25/2020 11:00 A M Medical Record Number: [id number] Patient Account Number: [id number] Date of Birth/Sex: Treating RN: [birth year] (75 y.o. Bradley Stokes, Bradley Stokes Primary Care Hines Kloss: [name] Other Clinician: Referring Lakasha Mcfall: Treating Nereida Schepp/Extender: [name] Wound Status Wound Number: 6 Primary Etiology: Lymphedema Wound Location: Left, Posterior Lower Leg Wound Status: Open Wounding Event: Gradually Appeared Date Acquired: 04/29/2020 Weeks Of Treatment: 7 Clustered Wound: No Wound Measurements Length: (cm) 0.3 Width: (cm) 0.4 Depth: (cm) 0.1 Area: (cm) 0.094 Volume: (cm) 0.009 % Reduction in Area: 99.6% % Reduction in Volume: 99.6% Wound Description Classification: Full Thickness Without Exposed Support Structur es Treatment Notes Wound #6 (Lower Leg) Wound Laterality: Left, Posterior Cleanser Soap and Water Discharge Instruction: Bradley shower and wash wound with dial antibacterial soap and water prior to dressing change. Normal Saline Discharge Instruction: Cleanse the wound with Normal Saline prior to applying a clean dressing using gauze sponges, not tissue or cotton balls. Peri-Wound Care Sween Lotion (Moisturizing lotion) Discharge Instruction: Apply moisturizing lotion as directed Triamcinolone 15 (g) Discharge Instruction: Use triamcinolone 15 (g) as directed Topical Primary Dressing Hydrofera Blue Classic Foam, 2x2 in Discharge Instruction: Moisten with saline prior to applying to wound bed Secondary Dressing Woven Gauze Sponge, Non-Sterile 4x4 in Discharge Instruction: Apply over primary dressing as directed. Secured With Compression Wrap FourPress (4 layer compression wrap) Discharge Instruction: Apply four layer compression as directed. Compression Stockings Circaid Juxta Lite Compression Wrap Quantity: 1 Left  Leg Compression Amount: 30-40 mmHg Discharge Instruction: Once you heal all your wounds, youll apply Circaid Juxta Lite Compression Wrap daily as instructed. Apply first thing in the morning, remove at night before bed. Add-Ons Electronic Signature(s) Signed: 06/25/2020 5:14:32 PM By: [name] RN Entered By: [name] on 06/25/2020 11:48:18 -------------------------------------------------------------------------------- [place]als Details Patient Name: Date of Service: Bradley Stokes. 06/25/2020 11:00 A M Medical Record Number: [id number] Patient Account Number: [id number] Date of Birth/Sex: Treating RN: [birth year] (75 y.o. Bradley Stokes, Bradley Stokes Primary Care Reannon Candella: [name] Other Clinician: Referring Teodora Baumgarten: Treating Zenovia Justman/Extender: [name] Vital Signs Time Taken: 11:46 Temperature (F): 98.2 Height (in): 69 Pulse (bpm): 74 Weight (lbs): 275 Respiratory Rate (breaths/min): 17 Body Mass Index (BMI): 40.6 Blood Pressure (mmHg): 172/82 Reference Range: 80 - 120 mg / dl Electronic Signature(s) Signed: 06/25/2020 5:14:32 PM By: [name] RN Entered By: [name] on 06/25/2020 11:47:20

## 2020-07-02 ENCOUNTER — Other Ambulatory Visit: Payer: Self-pay

## 2020-07-02 ENCOUNTER — Encounter (HOSPITAL_BASED_OUTPATIENT_CLINIC_OR_DEPARTMENT_OTHER): Payer: Medicare HMO | Attending: Internal Medicine | Admitting: Internal Medicine

## 2020-07-02 DIAGNOSIS — I890 Lymphedema, not elsewhere classified: Secondary | ICD-10-CM | POA: Insufficient documentation

## 2020-07-02 DIAGNOSIS — I129 Hypertensive chronic kidney disease with stage 1 through stage 4 chronic kidney disease, or unspecified chronic kidney disease: Secondary | ICD-10-CM | POA: Insufficient documentation

## 2020-07-02 DIAGNOSIS — I872 Venous insufficiency (chronic) (peripheral): Secondary | ICD-10-CM | POA: Diagnosis not present

## 2020-07-02 DIAGNOSIS — L97828 Non-pressure chronic ulcer of other part of left lower leg with other specified severity: Secondary | ICD-10-CM | POA: Diagnosis not present

## 2020-07-02 DIAGNOSIS — I480 Paroxysmal atrial fibrillation: Secondary | ICD-10-CM | POA: Diagnosis not present

## 2020-07-02 DIAGNOSIS — Z7901 Long term (current) use of anticoagulants: Secondary | ICD-10-CM | POA: Insufficient documentation

## 2020-07-02 DIAGNOSIS — N183 Chronic kidney disease, stage 3 unspecified: Secondary | ICD-10-CM | POA: Diagnosis not present

## 2020-07-02 DIAGNOSIS — L97222 Non-pressure chronic ulcer of left calf with fat layer exposed: Secondary | ICD-10-CM | POA: Diagnosis not present

## 2020-07-02 NOTE — Progress Notes (Signed)
Bradley, Stokes ([id number]) Visit Report for 07/02/2020 HPI Details Patient Name: Date of Service: Bradley Stokes, Bradley Stokes 07/02/2020 10:30 A M Medical Record Number: [id number] Patient Account Number: [id number] Date of Birth/Sex: Treating RN: [birth year] (75 y.o. Bradley Stokes Primary Care Provider: [name] Other Clinician: Referring Provider: Treating Provider/Extender: [name] in Treatment: 9 History of Present Illness HPI Description: Is aADMISSION 04/24/2020 Mr. Mahon is now a 75 year old nondiabetic man. He was here in 2010 and again in 2013 he says the last time for a traumatic wound at work in the setting of chronic venous disease and lymphedema. He has stockings at home but hasn't been wearing them for several weeks now. He says his primary provider Dr. Lyndle Herrlich sent him here for review of severe lymphedema with secondary skin changes including dry cracked hyperkeratotic vericused areas bilaterally. He has been using ammonium lactate lotion to try and soak off some of the dry flaking skin but really hadn't any open wound per se. He has not been using his compression stockings for 2 or 3 weeks he states because he is using the lotion on his legs and he wasn't sure whether to do both. Past medical history includes paroxysmal atrial fibrillation on Coumadin, hypertension, varicose veins and hyperlipidemia, stage III chronic renal failure, hyperlipidemia and probably osteoarthritis 2/1; severe bilateral lymphedema chronic venous insufficiency. Skin changes of lymphedema chronic xerotic skin. He had open areas on the posterior left calf last time. Arrives in clinic with much better edema control. He also has superficial areas on the right anterior were just noticing this week 2/8; the patient has better edema control. He has chronic xerotic skin and skin changes of lymphedema. He has nothing open on the right leg the area on the left is still open but  looking better. 2/15. He comes in with his juxta lite stocking on the right leg and everything seems fine here. He is using the ammonium lactate lotion. He has 3 areas 2 posteriorly and 1 medially on the left leg we have been using Hydrofera Blue under compression 2/22; juxta lite stockings on the right leg everything is healed here. He is using the ammonium lactate lotion. The only wounds that are open now where the tumor posteriorly. I am not sure about the surface of these in terms of viability but certainly they bleed easily. Using Hydrofera Blue 3/1; continuing to wear his juxta lite stocking on the right leg. He has 2 superficial areas on the left posterior lower calf however. We have been using Hydrofera Blue under compression 06/05/2020 on evaluation today patient appears to be doing much better in regard to his wounds. Fortunately there is no signs of active infection and in fact this appears to be if not healed almost completely healed. There may be just a small area but my main thing that I am concerned about here is that the patient still does seem to have some issues with being newly healed as far as the wound area is concerned and I am afraid that if we just got him right into his own compression that he would likely be prone to reopening fairly quickly. Obviously we do not want that. For that reason I think we likely need wrapping for 1 more week. 3/22; I thought the patient might be healed he has 2 superficial areas remaining. He did not make it in the clinic last week therefore the wrap was on for 2 solid week [illness and his sister]  4/4; 1 posterior wound on the left posterior calf. Very superficial epithelializing. His skin in this entire area is very fragile almost washes off of peeling epidermis Electronic Signature(s) Signed: 07/02/2020 4:59:32 PM By: Linton Ham MD Entered By: Linton Ham on 07/02/2020  11:55:46 -------------------------------------------------------------------------------- Physical Exam Details Patient Name: Date of Service: Bradley Stokes. 07/02/2020 10:30 A M Medical Record Number: [id number] Patient Account Number: [id number] Date of Birth/Sex: Treating RN: [birth year] (75 y.o. Bradley Stokes Primary Care Provider: [name] Other Clinician: Referring Provider: Treating Provider/Extender: [name] in Treatment: 9 Constitutional Patient is hypertensive.. Pulse regular and within target range for patient.Marland Kitchen Respirations regular, non-labored and within target range.. Temperature is normal and within the target range for the patient.Marland Kitchen Appears in no distress. Notes Wound exam; only 1 small area remains. This is on the posterior calf His skin is a lot more moist however still has healing epithelium. Severe chronic hemosiderin deposition. His edema control however is a lot better Electronic Signature(s) Signed: 07/02/2020 4:59:32 PM By: Linton Ham MD Entered By: Linton Ham on 07/02/2020 11:56:34 -------------------------------------------------------------------------------- Physician Orders Details Patient Name: Date of Service: Bradley Stokes. 07/02/2020 10:30 A M Medical Record Number: [id number] Patient Account Number: [id number] Date of Birth/Sex: Treating RN: Apr 11, 1945 (75 y.o. Bradley Stokes Primary Care Provider: [name] Other Clinician: Referring Provider: Treating Provider/Extender: [name] in Treatment: 9 Verbal / Phone Orders: No Diagnosis Coding ICD-10 Coding Code Description I89.0 Lymphedema, not elsewhere classified L97.828 Non-pressure chronic ulcer of other part of left lower leg with other specified severity Follow-up Appointments Return Appointment in 1 week. Bathing/ Shower/ Hygiene May shower with protection but do not get wound  dressing(s) wet. Edema Control - Lymphedema / SCD / Other Lymphedema Pumps. Use Lymphedema pumps on leg(s) 2-3 times a day for 45-60 minutes. If wearing any wraps or hose, do not remove them. Continue exercising as instructed. - Ordering pump today. Elevate legs to the level of the heart or above for 30 minutes daily and/or when sitting, a frequency of: Avoid standing for long periods of time. Patient to wear own compression stockings every day. - Wear the juxtalite on the right leg. Apply in the morning and remove at bedtime. Moisturize legs daily. - Also continue using the ammonium lactate as prescribed by your PCP Wound Treatment Wound #6 - Lower Leg Wound Laterality: Left, Posterior Cleanser: Soap and Water 1 x Per Week/15 Days Discharge Instructions: May shower and wash wound with dial antibacterial soap and water prior to dressing change. Peri-Wound Care: Sween Lotion (Moisturizing lotion) 1 x Per Week/15 Days Discharge Instructions: Apply moisturizing lotion as directed Peri-Wound Care: Triamcinolone 15 (g) 1 x Per Week/15 Days Discharge Instructions: Use triamcinolone 15 (g) as directed Prim Dressing: Hydrofera Blue Classic Foam, 2x2 in 1 x Per Week/15 Days ary Discharge Instructions: Moisten with saline prior to applying to wound bed Secondary Dressing: Woven Gauze Sponge, Non-Sterile 4x4 in 1 x Per Week/15 Days Discharge Instructions: Apply over primary dressing as directed. Compression Wrap: FourPress (4 layer compression wrap) 1 x Per Week/15 Days Discharge Instructions: Apply four layer compression as directed. Electronic Signature(s) Signed: 07/02/2020 4:59:32 PM By: Linton Ham MD Signed: 07/02/2020 5:00:21 PM By: Levan Hurst RN, BSN Entered By: Levan Hurst on 07/02/2020 11:07:28 -------------------------------------------------------------------------------- Problem List Details Patient Name: Date of Service: ROLAN, WRIGHTSMAN 07/02/2020 10:30 A M Medical Record  Number: [id number] Patient Account Number: [id number] Date of Birth/Sex: Treating RN:  [birth year] (75 y.o. Bradley Stokes Primary Care Provider: [name] Other Clinician: Referring Provider: Treating Provider/Extender: [name] in Treatment: 9 Active Problems ICD-10 Encounter Code Description Active Date MDM Diagnosis I89.0 Lymphedema, not elsewhere classified 04/24/2020 No Yes L97.828 Non-pressure chronic ulcer of other part of left lower leg with other specified 04/24/2020 No Yes severity Inactive Problems ICD-10 Code Description Active Date Inactive Date L97.811 Non-pressure chronic ulcer of other part of right lower leg limited to breakdown of skin 05/01/2020 05/01/2020 Resolved Problems Electronic Signature(s) Signed: 07/02/2020 4:59:32 PM By: Linton Ham MD Entered By: Linton Ham on 07/02/2020 11:54:17 -------------------------------------------------------------------------------- Progress Note Details Patient Name: Date of Service: Bradley Stokes 07/02/2020 10:30 A M Medical Record Number: [id number] Patient Account Number: [id number] Date of Birth/Sex: Treating RN: [birth year] (75 y.o. Bradley Stokes Primary Care Provider: [name] Other Clinician: Referring Provider: Treating Provider/Extender: [name] in Treatment: 9 Subjective History of Present Illness (HPI) Is aADMISSION 04/24/2020 Mr. Maslowski is now a 75 year old nondiabetic man. He was here in 2010 and again in 2013 he says the last time for a traumatic wound at work in the setting of chronic venous disease and lymphedema. He has stockings at home but hasn't been wearing them for several weeks now. He says his primary provider Dr. Lyndle Herrlich sent him here for review of severe lymphedema with secondary skin changes including dry cracked hyperkeratotic vericused areas bilaterally. He has been using ammonium lactate lotion to try  and soak off some of the dry flaking skin but really hadn't any open wound per se. He has not been using his compression stockings for 2 or 3 weeks he states because he is using the lotion on his legs and he wasn't sure whether to do both. Past medical history includes paroxysmal atrial fibrillation on Coumadin, hypertension, varicose veins and hyperlipidemia, stage III chronic renal failure, hyperlipidemia and probably osteoarthritis 2/1; severe bilateral lymphedema chronic venous insufficiency. Skin changes of lymphedema chronic xerotic skin. He had open areas on the posterior left calf last time. Arrives in clinic with much better edema control. He also has superficial areas on the right anterior were just noticing this week 2/8; the patient has better edema control. He has chronic xerotic skin and skin changes of lymphedema. He has nothing open on the right leg the area on the left is still open but looking better. 2/15. He comes in with his juxta lite stocking on the right leg and everything seems fine here. He is using the ammonium lactate lotion. He has 3 areas 2 posteriorly and 1 medially on the left leg we have been using Hydrofera Blue under compression 2/22; juxta lite stockings on the right leg everything is healed here. He is using the ammonium lactate lotion. The only wounds that are open now where the tumor posteriorly. I am not sure about the surface of these in terms of viability but certainly they bleed easily. Using Hydrofera Blue 3/1; continuing to wear his juxta lite stocking on the right leg. He has 2 superficial areas on the left posterior lower calf however. We have been using Hydrofera Blue under compression 06/05/2020 on evaluation today patient appears to be doing much better in regard to his wounds. Fortunately there is no signs of active infection and in fact this appears to be if not healed almost completely healed. There may be just a small area but my main thing that I am  concerned about here  is that the patient still does seem to have some issues with being newly healed as far as the wound area is concerned and I am afraid that if we just got him right into his own compression that he would likely be prone to reopening fairly quickly. Obviously we do not want that. For that reason I think we likely need wrapping for 1 more week. 3/22; I thought the patient might be healed he has 2 superficial areas remaining. He did not make it in the clinic last week therefore the wrap was on for 2 solid week [illness and his sister] 4/4; 1 posterior wound on the left posterior calf. Very superficial epithelializing. His skin in this entire area is very fragile almost washes off of peeling epidermis Objective Constitutional Patient is hypertensive.. Pulse regular and within target range for patient.Marland Kitchen Respirations regular, non-labored and within target range.. Temperature is normal and within the target range for the patient.Marland Kitchen Appears in no distress. Vitals Time Taken: 10:25 AM, Height: 69 in, Weight: 275 lbs, BMI: 40.6, Temperature: 97.4 F, Pulse: 98 bpm, Respiratory Rate: 17 breaths/min, Blood Pressure: 186/82 mmHg. General Notes: Wound exam; only 1 small area remains. This is on the posterior calf ooHis skin is a lot more moist however still has healing epithelium. Severe chronic hemosiderin deposition. His edema control however is a lot better Integumentary (Hair, Skin) Wound #6 status is Open. Original cause of wound was Gradually Appeared. The date acquired was: 04/29/2020. The wound has been in treatment 8 weeks. The wound is located on the Left,Posterior Lower Leg. The wound measures 1cm length x 0.9cm width x 0.1cm depth; 0.707cm^2 area and 0.071cm^3 volume. There is Fat Layer (Subcutaneous Tissue) exposed. There is no tunneling or undermining noted. There is a medium amount of serosanguineous drainage noted. The wound margin is distinct with the outline attached to  the wound base. There is large (67-100%) red granulation within the wound bed. There is no necrotic tissue within the wound bed. Assessment Active Problems ICD-10 Lymphedema, not elsewhere classified Non-pressure chronic ulcer of other part of left lower leg with other specified severity Procedures Wound #6 Pre-procedure diagnosis of Wound #6 is a Lymphedema located on the Left,Posterior Lower Leg . There was a Four Layer Compression Therapy Procedure by Levan Hurst, RN. Post procedure Diagnosis Wound #6: Same as Pre-Procedure Plan Follow-up Appointments: Return Appointment in 1 week. Bathing/ Shower/ Hygiene: May shower with protection but do not get wound dressing(s) wet. Edema Control - Lymphedema / SCD / Other: Lymphedema Pumps. Use Lymphedema pumps on leg(s) 2-3 times a day for 45-60 minutes. If wearing any wraps or hose, do not remove them. Continue exercising as instructed. - Ordering pump today. Elevate legs to the level of the heart or above for 30 minutes daily and/or when sitting, a frequency of: Avoid standing for long periods of time. Patient to wear own compression stockings every day. - Wear the juxtalite on the right leg. Apply in the morning and remove at bedtime. Moisturize legs daily. - Also continue using the ammonium lactate as prescribed by your PCP WOUND #6: - Lower Leg Wound Laterality: Left, Posterior Cleanser: Soap and Water 1 x Per Week/15 Days Discharge Instructions: May shower and wash wound with dial antibacterial soap and water prior to dressing change. Peri-Wound Care: Sween Lotion (Moisturizing lotion) 1 x Per Week/15 Days Discharge Instructions: Apply moisturizing lotion as directed Peri-Wound Care: Triamcinolone 15 (g) 1 x Per Week/15 Days Discharge Instructions: Use triamcinolone 15 (g) as directed  Prim Dressing: Hydrofera Blue Classic Foam, 2x2 in 1 x Per Week/15 Days ary Discharge Instructions: Moisten with saline prior to applying to wound  bed Secondary Dressing: Woven Gauze Sponge, Non-Sterile 4x4 in 1 x Per Week/15 Days Discharge Instructions: Apply over primary dressing as directed. Com pression Wrap: FourPress (4 layer compression wrap) 1 x Per Week/15 Days Discharge Instructions: Apply four layer compression as directed. 1. Still using Hydrofera Blue 2. We have the condition of his skin a lot better although still very friable epithelium. 3 were in the process of trying to get compression pumps. We had to send a letter of medical necessity this week Electronic Signature(s) Signed: 07/02/2020 4:59:32 PM By: Linton Ham MD Entered By: Linton Ham on 07/02/2020 11:58:59 -------------------------------------------------------------------------------- SuperBill Details Patient Name: Date of Service: Bradley Stokes 07/02/2020 Medical Record Number: [id number] Patient Account Number: [id number] Date of Birth/Sex: Treating RN: 1945/05/26 (75 y.o. Bradley Stokes Primary Care Provider: [name] Other Clinician: Referring Provider: Treating Provider/Extender: [name] in Treatment: 9 Diagnosis Coding ICD-10 Codes Code Description I89.0 Lymphedema, not elsewhere classified L97.828 Non-pressure chronic ulcer of other part of left lower leg with other specified severity Facility Procedures CPT4 Code: [id number] Description: (Facility Use Only) [phone] - [place] L[facility] LT LEG Modifier: Quantity: 1 Physician Procedures : CPT4 Code Description Modifier [id number] - WC PHYS LEVEL 3 - EST PT ICD-10 Diagnosis Description I89.0 Lymphedema, not elsewhere classified L97.828 Non-pressure chronic ulcer of other part of left lower leg with other specified severity Quantity: 1 Electronic Signature(s) Signed: 07/02/2020 4:59:32 PM By: Linton Ham MD Entered By: Linton Ham on 07/02/2020 11:59:15

## 2020-07-03 NOTE — Progress Notes (Signed)
Bradley, Stokes ([id number]) Visit Report for 07/02/2020 Arrival Information Details Patient Name: Date of Service: Bradley Stokes, Bradley Stokes 07/02/2020 10:30 A M Medical Record Number: [id number] Patient Account Number: [id number] Date of Birth/Sex: Treating RN: 1945-07-21 (75 y.o. Bradley Stokes Primary Care Orin Eberwein: Bradley Stokes Other Clinician: Referring Mei Suits: Treating Karrina Lye/Extender: [name] in Treatment: 9 Visit Information History Since Last Visit Added or deleted any medications: No Patient Arrived: Bradley Stokes Any new allergies or adverse reactions: No Arrival Time: 10:24 Had a fall or experienced change in No Accompanied By: self activities of daily living that may affect Transfer Assistance: None risk of falls: Patient Identification Verified: Yes Signs or symptoms of abuse/neglect since last visito No Secondary Verification Process Completed: Yes Hospitalized since last visit: No Patient Requires Transmission-Based Precautions: No Implantable device outside of the clinic excluding No Patient Has Alerts: No cellular tissue based products placed in the center since last visit: Has Dressing in Place as Prescribed: Yes Pain Present Now: No Electronic Signature(s) Signed: 07/03/2020 2:26:07 PM By: [name] Entered By: [name] on 07/02/2020 10:25:50 -------------------------------------------------------------------------------- Compression Therapy Details Patient Name: Date of Service: Bradley Stokes 07/02/2020 10:30 A M Medical Record Number: [id number] Patient Account Number: [id number] Date of Birth/Sex: Treating RN: [birth year] (75 y.o. Bradley Stokes Primary Care Daylyn Azbill: Bradley Stokes Other Clinician: Referring Stefannie Defeo: Treating Khali Albanese/Extender: [name] in Treatment: 9 Compression Therapy Performed for Wound Assessment: Wound #6 Left,Posterior Lower Leg Performed  By: Clinician Levan Hurst, RN Compression Type: Four Layer Post Procedure Diagnosis Same as Pre-procedure Electronic Signature(s) Signed: 07/02/2020 5:00:21 PM By: Levan Hurst RN, BSN Entered By: Levan Hurst on 07/02/2020 11:08:11 -------------------------------------------------------------------------------- Lower Extremity Assessment Details Patient Name: Date of Service: Bradley Stokes 07/02/2020 10:30 A M Medical Record Number: [id number] Patient Account Number: [id number] Date of Birth/Sex: Treating RN: 12-Sep-1945 (75 y.o. [name] Primary Care Barre Aydelott: Bradley Stokes Other Clinician: Referring Isabelle Matt: Treating Fleming Prill/Extender: [name] in Treatment: 9 Edema Assessment Assessed: [Left: Yes] [Right: No] Edema: [Left: Ye] [Right: s] Calf Left: Right: Point of Measurement: 34 cm From Medial Instep 36 cm Ankle Left: Right: Point of Measurement: 11 cm From Medial Instep 23 cm Vascular Assessment Pulses: Dorsalis Pedis Palpable: [Left:Yes] Electronic Signature(s) Signed: 07/02/2020 4:57:52 PM By: Deon Pilling Entered By: Deon Pilling on 07/02/2020 10:44:16 -------------------------------------------------------------------------------- Multi-Disciplinary Care Plan Details Patient Name: Date of Service: Bradley Stokes. 07/02/2020 10:30 A M Medical Record Number: [id number] Patient Account Number: [id number] Date of Birth/Sex: Treating RN: 1946-03-26 (75 y.o. Bradley Stokes Primary Care Shabreka Coulon: Bradley Stokes Other Clinician: Referring Elly Haffey: Treating Emara Lichter/Extender: [name] in Treatment: 9 Active Inactive Wound/Skin Impairment Nursing Diagnoses: Knowledge deficit related to ulceration/compromised skin integrity Goals: Patient will have a decrease in wound volume by X% from date: (specify in notes) Date Initiated: 04/24/2020 Date Inactivated:  07/02/2020 Target Resolution Date: 06/29/2020 Goal Status: Met Patient/caregiver will verbalize understanding of skin care regimen Date Initiated: 04/24/2020 Target Resolution Date: 08/03/2020 Goal Status: Active Ulcer/skin breakdown will have a volume reduction of 30% by week 4 Date Initiated: 04/24/2020 Date Inactivated: 06/05/2020 Target Resolution Date: 06/29/2020 Goal Status: Met Interventions: Assess patient/caregiver ability to obtain necessary supplies Assess patient/caregiver ability to perform ulcer/skin care regimen upon admission and as needed Assess ulceration(s) every visit Notes: Electronic Signature(s) Signed: 07/02/2020 5:00:21 PM By: Levan Hurst RN, BSN Entered By: Levan Hurst on 07/02/2020 10:32:07 -------------------------------------------------------------------------------- Pain Assessment  Details Patient Name: Date of Service: Bradley Stokes, Bradley Stokes 07/02/2020 10:30 A M Medical Record Number: [id number] Patient Account Number: [id number] Date of Birth/Sex: Treating RN: 14-Mar-1946 (76 y.o. Bradley Stokes Primary Care Nashiya Disbrow: Bradley Stokes Other Clinician: Referring Asahel Risden: Treating Sadako Cegielski/Extender: [name] in Treatment: 9 Active Problems Location of Pain Severity and Description of Pain Patient Has Paino No Site Locations Pain Management and Medication Current Pain Management: Electronic Signature(s) Signed: 07/02/2020 5:00:21 PM By: Levan Hurst RN, BSN Signed: 07/03/2020 2:26:07 PM By: [name] Entered By: [name] on 07/02/2020 10:26:10 -------------------------------------------------------------------------------- Patient/Caregiver Education Details Patient Name: Date of Service: Bradley Stokes 4/4/2022andnbsp10:30 A M Medical Record Number: [id number] Patient Account Number: [id number] Date of Birth/Gender: Treating RN: 1945-09-06 (75 y.o. Bradley Stokes Primary Care Physician:  Bradley Stokes Other Clinician: Referring Physician: Treating Physician/Extender: [name] in Treatment: 9 Education Assessment Education Provided To: Patient Education Topics Provided Wound/Skin Impairment: Methods: Explain/Verbal Responses: State content correctly [employer]) Signed: 07/02/2020 5:00:21 PM By: Levan Hurst RN, BSN Entered By: Levan Hurst on 07/02/2020 10:32:39 -------------------------------------------------------------------------------- Wound Assessment Details Patient Name: Date of Service: Bradley Stokes 07/02/2020 10:30 A M Medical Record Number: [id number] Patient Account Number: [id number] Date of Birth/Sex: Treating RN: May 24, 1945 (75 y.o. Bradley Stokes Primary Care Cenia Zaragosa: Bradley Stokes Other Clinician: Referring Suzann Lazaro: Treating Bastian Andreoli/Extender: [name] in Treatment: 9 Wound Status Wound Number: 6 Primary Lymphedema Etiology: Wound Location: Left, Posterior Lower Leg Wound Open Wounding Event: Gradually Appeared Status: Date Acquired: 04/29/2020 Comorbid Cataracts, Lymphedema, Arrhythmia, Hypertension, Myocardial Weeks Of Treatment: 8 History: Infarction, Peripheral Venous Disease, Gout, Osteoarthritis Clustered Wound: No Photos Wound Measurements Length: (cm) 1 Width: (cm) 0.9 Depth: (cm) 0.1 Area: (cm) 0.707 Volume: (cm) 0.071 % Reduction in Area: 97% % Reduction in Volume: 97% Epithelialization: Medium (34-66%) Tunneling: No Undermining: No Wound Description Classification: Full Thickness Without Exposed Support Structures Wound Margin: Distinct, outline attached Exudate Amount: Medium Exudate Type: Serosanguineous Exudate Color: red, brown Foul Odor After Cleansing: No Slough/Fibrino No Wound Bed Granulation Amount: Large (67-100%) Exposed Structure Granulation Quality: Red Fascia Exposed: No Necrotic Amount: None  Present (0%) Fat Layer (Subcutaneous Tissue) Exposed: Yes Tendon Exposed: No Muscle Exposed: No Joint Exposed: No Bone Exposed: No Electronic Signature(s) Signed: 07/02/2020 5:00:21 PM By: Levan Hurst RN, BSN Signed: 07/03/2020 2:26:07 PM By: [name] Entered By: [name] on 07/02/2020 16:47:30 -------------------------------------------------------------------------------- Downs Details Patient Name: Date of Service: Bradley Stokes. 07/02/2020 10:30 A M Medical Record Number: [id number] Patient Account Number: [id number] Date of Birth/Sex: Treating RN: 09-Apr-1945 (75 y.o. Bradley Stokes Primary Care Jhade Berko: Bradley Stokes Other Clinician: Referring Secundino Ellithorpe: Treating Adrien Dietzman/Extender: [name] in Treatment: 9 Vital Signs Time Taken: 10:25 Temperature (F): 97.4 Height (in): 69 Pulse (bpm): 98 Weight (lbs): 275 Respiratory Rate (breaths/min): 17 Body Mass Index (BMI): 40.6 Blood Pressure (mmHg): 186/82 Reference Range: 80 - 120 mg / dl Electronic Signature(s) Signed: 07/03/2020 2:26:07 PM By: [name] Entered By: [name] on 07/02/2020 10:26:05

## 2020-07-04 DIAGNOSIS — I482 Chronic atrial fibrillation, unspecified: Secondary | ICD-10-CM | POA: Diagnosis not present

## 2020-07-04 DIAGNOSIS — Z7901 Long term (current) use of anticoagulants: Secondary | ICD-10-CM | POA: Diagnosis not present

## 2020-07-09 ENCOUNTER — Other Ambulatory Visit: Payer: Self-pay

## 2020-07-09 ENCOUNTER — Encounter (HOSPITAL_BASED_OUTPATIENT_CLINIC_OR_DEPARTMENT_OTHER): Payer: Medicare HMO | Admitting: Internal Medicine

## 2020-07-09 DIAGNOSIS — L97228 Non-pressure chronic ulcer of left calf with other specified severity: Secondary | ICD-10-CM | POA: Diagnosis not present

## 2020-07-09 DIAGNOSIS — I890 Lymphedema, not elsewhere classified: Secondary | ICD-10-CM | POA: Diagnosis not present

## 2020-07-09 DIAGNOSIS — Z7901 Long term (current) use of anticoagulants: Secondary | ICD-10-CM | POA: Diagnosis not present

## 2020-07-09 DIAGNOSIS — I129 Hypertensive chronic kidney disease with stage 1 through stage 4 chronic kidney disease, or unspecified chronic kidney disease: Secondary | ICD-10-CM | POA: Diagnosis not present

## 2020-07-09 DIAGNOSIS — L97828 Non-pressure chronic ulcer of other part of left lower leg with other specified severity: Secondary | ICD-10-CM | POA: Diagnosis not present

## 2020-07-09 DIAGNOSIS — N183 Chronic kidney disease, stage 3 unspecified: Secondary | ICD-10-CM | POA: Diagnosis not present

## 2020-07-09 DIAGNOSIS — I480 Paroxysmal atrial fibrillation: Secondary | ICD-10-CM | POA: Diagnosis not present

## 2020-07-09 DIAGNOSIS — I872 Venous insufficiency (chronic) (peripheral): Secondary | ICD-10-CM | POA: Diagnosis not present

## 2020-07-10 NOTE — Progress Notes (Signed)
Bradley, Stokes ([id number]) Visit Report for 07/09/2020 HPI Details Patient Name: Date of Service: Bradley Stokes, Bradley Stokes 07/09/2020 10:45 A M Medical Record Number: [id number] Patient Account Number: [id number] Date of Birth/Sex: Treating RN: Jan 25, 1946 (75 y.o. [name] Primary Care Provider: [name] Other Clinician: Referring Provider: Treating Provider/Extender: [name] in Treatment: 10 History of Present Illness HPI Description: Is aADMISSION 04/24/2020 Bradley Stokes is now a 75 year old nondiabetic man. He was here in 2010 and again in 2013 he says the last time for a traumatic wound at work in the setting of chronic venous disease and lymphedema. He has stockings at home but hasn't been wearing them for several weeks now. He says his primary provider Dr. Lyndle Herrlich sent him here for review of severe lymphedema with secondary skin changes including dry cracked hyperkeratotic vericused areas bilaterally. He has been using ammonium lactate lotion to try and soak off some of the dry flaking skin but really hadn't any open wound per se. He has not been using his compression stockings for 2 or 3 weeks he states because he is using the lotion on his legs and he wasn't sure whether to do both. Past medical history includes paroxysmal atrial fibrillation on Coumadin, hypertension, varicose veins and hyperlipidemia, stage III chronic renal failure, hyperlipidemia and probably osteoarthritis 2/1; severe bilateral lymphedema chronic venous insufficiency. Skin changes of lymphedema chronic xerotic skin. He had open areas on the posterior left calf last time. Arrives in clinic with much better edema control. He also has superficial areas on the right anterior were just noticing this week 2/8; the patient has better edema control. He has chronic xerotic skin and skin changes of lymphedema. He has nothing open on the right leg the area on the left is still open  but looking better. 2/15. He comes in with his juxta lite stocking on the right leg and everything seems fine here. He is using the ammonium lactate lotion. He has 3 areas 2 posteriorly and 1 medially on the left leg we have been using Hydrofera Blue under compression 2/22; juxta lite stockings on the right leg everything is healed here. He is using the ammonium lactate lotion. The only wounds that are open now where the tumor posteriorly. I am not sure about the surface of these in terms of viability but certainly they bleed easily. Using Hydrofera Blue 3/1; continuing to wear his juxta lite stocking on the right leg. He has 2 superficial areas on the left posterior lower calf however. We have been using Hydrofera Blue under compression 06/05/2020 on evaluation today patient appears to be doing much better in regard to his wounds. Fortunately there is no signs of active infection and in fact this appears to be if not healed almost completely healed. There may be just a small area but my main thing that I am concerned about here is that the patient still does seem to have some issues with being newly healed as far as the wound area is concerned and I am afraid that if we just got him right into his own compression that he would likely be prone to reopening fairly quickly. Obviously we do not want that. For that reason I think we likely need wrapping for 1 more week. 3/22; I thought the patient might be healed he has 2 superficial areas remaining. He did not make it in the clinic last week therefore the wrap was on for 2 solid week [illness and his sister]  4/4; 1 posterior wound on the left posterior calf. Very superficial epithelializing. His skin in this entire area is very fragile almost washes off of peeling epidermis 4/11; everything is closed here. Has severe chronic venous insufficiency with secondary lymphedema. He has bilateral juxta lite stockings which she seems to be compliant with. We have  been looking into external compression pumps with for him. Electronic Signature(s) Signed: 07/09/2020 4:45:43 PM By: Linton Ham MD Entered By: Linton Ham on 07/09/2020 12:07:50 -------------------------------------------------------------------------------- Physical Exam Details Patient Name: Date of Service: Bradley Stokes, Bradley Stokes 07/09/2020 10:45 A M Medical Record Number: [id number] Patient Account Number: [id number] Date of Birth/Sex: Treating RN: 05/17/1945 (75 y.o. [name] Primary Care Provider: [name] Other Clinician: Referring Provider: Treating Provider/Extender: [name] in Treatment: 10 Constitutional Patient is hypertensive.. Pulse regular and within target range for patient.Marland Kitchen Respirations regular, non-labored and within target range.. Temperature is normal and within the target range for the patient.Marland Kitchen Appears in no distress. Notes Wound exam; everything on the left leg is closed. The skin on his lower leg is very fragile chronically damaged. There is chronic hemosiderin deposition. He arrives in clinic however with the edema very well controlled Electronic Signature(s) Signed: 07/09/2020 4:45:43 PM By: Linton Ham MD Entered By: Linton Ham on 07/09/2020 12:08:49 -------------------------------------------------------------------------------- Physician Orders Details Patient Name: Date of Service: Bradley Stokes 07/09/2020 10:45 A M Medical Record Number: [id number] Patient Account Number: [id number] Date of Birth/Sex: Treating RN: 02/11/1946 (75 y.o. [name] Primary Care Provider: [name] Other Clinician: Referring Provider: Treating Provider/Extender: [name] in Treatment: 10 Verbal / Phone Orders: No Diagnosis Coding ICD-10 Coding Code Description I89.0 Lymphedema, not elsewhere classified L97.828 Non-pressure chronic ulcer of other part of  left lower leg with other specified severity Discharge From [facility] Services Discharge from [place] Edema Control - Lymphedema / SCD / Other Lymphedema Pumps. Use Lymphedema pumps on leg(s) 2-3 times a day for 45-60 minutes. If wearing any wraps or hose, do not remove them. Continue exercising as instructed. - Ok to use pumps over compression stockings Elevate legs to the level of the heart or above for 30 minutes daily and/or when sitting, a frequency of: - throughout the day Avoid standing for long periods of time. Moisturize legs daily. - Also continue using the ammonium lactate as prescribed by your PCP Compression stocking or Garment 30-40 mm/Hg pressure to: - Juxtalite to both legs daily, apply first thing in the morning, remove at night before bed Electronic Signature(s) Signed: 07/09/2020 4:45:43 PM By: Linton Ham MD Signed: 07/09/2020 5:30:45 PM By: Levan Hurst RN, BSN Entered By: Levan Hurst on 07/09/2020 11:40:12 -------------------------------------------------------------------------------- Problem List Details Patient Name: Date of Service: Bradley Stokes 07/09/2020 10:45 A M Medical Record Number: [id number] Patient Account Number: [id number] Date of Birth/Sex: Treating RN: [birth year] (75 y.o. [name] Primary Care Provider: [name] Other Clinician: Referring Provider: Treating Provider/Extender: [name] in Treatment: 10 Active Problems ICD-10 Encounter Code Description Active Date MDM Diagnosis I89.0 Lymphedema, not elsewhere classified 04/24/2020 No Yes L97.828 Non-pressure chronic ulcer of other part of left lower leg with other specified 04/24/2020 No Yes severity Inactive Problems ICD-10 Code Description Active Date Inactive Date L97.811 Non-pressure chronic ulcer of other part of right lower leg limited to breakdown of skin 05/01/2020 05/01/2020 Resolved Problems Electronic  Signature(s) Signed: 07/09/2020 4:45:43 PM By: Linton Ham MD Entered By: Linton Ham  on 07/09/2020 12:06:47 -------------------------------------------------------------------------------- Progress Note Details Patient Name: Date of Service: Bradley Stokes, Bradley Stokes 07/09/2020 10:45 A M Medical Record Number: [id number] Patient Account Number: [id number] Date of Birth/Sex: Treating RN: November 03, 1945 (75 y.o. [name] Primary Care Provider: [name] Other Clinician: Referring Provider: Treating Provider/Extender: [name] in Treatment: 10 Subjective History of Present Illness (HPI) Is aADMISSION 04/24/2020 Mr. Bertram is now a 75 year old nondiabetic man. He was here in 2010 and again in 2013 he says the last time for a traumatic wound at work in the setting of chronic venous disease and lymphedema. He has stockings at home but hasn't been wearing them for several weeks now. He says his primary provider Dr. Lyndle Herrlich sent him here for review of severe lymphedema with secondary skin changes including dry cracked hyperkeratotic vericused areas bilaterally. He has been using ammonium lactate lotion to try and soak off some of the dry flaking skin but really hadn't any open wound per se. He has not been using his compression stockings for 2 or 3 weeks he states because he is using the lotion on his legs and he wasn't sure whether to do both. Past medical history includes paroxysmal atrial fibrillation on Coumadin, hypertension, varicose veins and hyperlipidemia, stage III chronic renal failure, hyperlipidemia and probably osteoarthritis 2/1; severe bilateral lymphedema chronic venous insufficiency. Skin changes of lymphedema chronic xerotic skin. He had open areas on the posterior left calf last time. Arrives in clinic with much better edema control. He also has superficial areas on the right anterior were just noticing this week 2/8; the patient has  better edema control. He has chronic xerotic skin and skin changes of lymphedema. He has nothing open on the right leg the area on the left is still open but looking better. 2/15. He comes in with his juxta lite stocking on the right leg and everything seems fine here. He is using the ammonium lactate lotion. He has 3 areas 2 posteriorly and 1 medially on the left leg we have been using Hydrofera Blue under compression 2/22; juxta lite stockings on the right leg everything is healed here. He is using the ammonium lactate lotion. The only wounds that are open now where the tumor posteriorly. I am not sure about the surface of these in terms of viability but certainly they bleed easily. Using Hydrofera Blue 3/1; continuing to wear his juxta lite stocking on the right leg. He has 2 superficial areas on the left posterior lower calf however. We have been using Hydrofera Blue under compression 06/05/2020 on evaluation today patient appears to be doing much better in regard to his wounds. Fortunately there is no signs of active infection and in fact this appears to be if not healed almost completely healed. There may be just a small area but my main thing that I am concerned about here is that the patient still does seem to have some issues with being newly healed as far as the wound area is concerned and I am afraid that if we just got him right into his own compression that he would likely be prone to reopening fairly quickly. Obviously we do not want that. For that reason I think we likely need wrapping for 1 more week. 3/22; I thought the patient might be healed he has 2 superficial areas remaining. He did not make it in the clinic last week therefore the wrap was on for 2 solid week [illness and his sister] 4/4; 1 posterior  wound on the left posterior calf. Very superficial epithelializing. His skin in this entire area is very fragile almost washes off of peeling epidermis 4/11; everything is closed  here. Has severe chronic venous insufficiency with secondary lymphedema. He has bilateral juxta lite stockings which she seems to be compliant with. We have been looking into external compression pumps with for him. Objective Constitutional Patient is hypertensive.. Pulse regular and within target range for patient.Marland Kitchen Respirations regular, non-labored and within target range.. Temperature is normal and within the target range for the patient.Marland Kitchen Appears in no distress. Vitals Time Taken: 11:27 AM, Height: 69 in, Weight: 275 lbs, BMI: 40.6, Temperature: 98.5 F, Pulse: 81 bpm, Respiratory Rate: 17 breaths/min, Blood Pressure: 175/80 mmHg. General Notes: Wound exam; everything on the left leg is closed. The skin on his lower leg is very fragile chronically damaged. There is chronic hemosiderin deposition. He arrives in clinic however with the edema very well controlled Integumentary (Hair, Skin) Wound #6 status is Healed - Epithelialized. Original cause of wound was Gradually Appeared. The date acquired was: 04/29/2020. The wound has been in treatment 9 weeks. The wound is located on the Left,Posterior Lower Leg. The wound measures 0cm length x 0cm width x 0cm depth; 0cm^2 area and 0cm^3 volume. Assessment Active Problems ICD-10 Lymphedema, not elsewhere classified Non-pressure chronic ulcer of other part of left lower leg with other specified severity Plan Discharge From [facility] Services: Discharge from [place] Edema Control - Lymphedema / SCD / Other: Lymphedema Pumps. Use Lymphedema pumps on leg(s) 2-3 times a day for 45-60 minutes. If wearing any wraps or hose, do not remove them. Continue exercising as instructed. - Ok to use pumps over compression stockings Elevate legs to the level of the heart or above for 30 minutes daily and/or when sitting, a frequency of: - throughout the day Avoid standing for long periods of time. Moisturize legs daily. - Also continue using the ammonium  lactate as prescribed by your PCP Compression stocking or Garment 30-40 mm/Hg pressure to: - Juxtalite to both legs daily, apply first thing in the morning, remove at night before bed 1. The patient can be discharged from the clinic. 2. We are still going to work through the compression pumps issue. 3. Advised to set his external compression stockings at 30 to 40 mmHg Electronic Signature(s) Signed: 07/09/2020 4:45:43 PM By: Linton Ham MD Entered By: Linton Ham on 07/09/2020 12:10:58 -------------------------------------------------------------------------------- SuperBill Details Patient Name: Date of Service: Bradley Stokes 07/09/2020 Medical Record Number: [id number] Patient Account Number: [id number] Date of Birth/Sex: Treating RN: Aug 28, 1945 (75 y.o. [name] Primary Care Provider: [name] Other Clinician: Referring Provider: Treating Provider/Extender: [name] in Treatment: 10 Diagnosis Coding ICD-10 Codes Code Description I89.0 Lymphedema, not elsewhere classified L97.828 Non-pressure chronic ulcer of other part of left lower leg with other specified severity Facility Procedures CPT4 Code: [id number] Description: [phone] - WOUND CARE VISIT-LEV 2 EST PT Modifier: Quantity: 1 Physician Procedures : CPT4 Code Description Modifier [id number] - WC PHYS LEVEL 2 - EST PT ICD-10 Diagnosis Description I89.0 Lymphedema, not elsewhere classified L97.828 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

## 2020-07-11 NOTE — Progress Notes (Signed)
Bradley, Stokes ([id number]) Visit Report for 07/09/2020 Arrival Information Details Patient Name: Date of Service: Bradley Stokes, Bradley Stokes 07/09/2020 10:45 A M Medical Record Number: [id number] Patient Account Number: [id number] Date of Birth/Sex: Treating RN: Jul 18, 1945 (75 y.o. Bradley Stokes Primary Care Bradley Stokes: Bradley Stokes Other Clinician: Referring Bradley Stokes: Treating Bradley Stokes/Extender: Bradley Stokes Weeks in Treatment: 10 Visit Information History Since Last Visit Added or deleted any medications: No Patient Arrived: Bradley Stokes Any new allergies or adverse reactions: No Arrival Time: 11:27 Had a fall or experienced change in No Accompanied By: self activities of daily living that may affect Transfer Assistance: None risk of falls: Patient Requires Transmission-Based Precautions: No Signs or symptoms of abuse/neglect since last visito No Patient Has Alerts: No Hospitalized since last visit: No Implantable device outside of the clinic excluding No cellular tissue based products placed in the center since last visit: Has Dressing in Place as Prescribed: Yes Pain Present Now: No Electronic Signature(s) Signed: 07/11/2020 8:29:36 AM By: Bradley Stokes Entered By: Bradley Stokes on 07/09/2020 11:27:43 -------------------------------------------------------------------------------- Clinic Level of Care Assessment Details Patient Name: Date of Service: Bradley Stokes, Bradley Stokes 07/09/2020 10:45 A M Medical Record Number: [id number] Patient Account Number: [id number] Date of Birth/Sex: Treating RN: 02/05/1946 (75 y.o. Bradley Stokes Primary Care Bradley Stokes: Bradley Stokes Other Clinician: Referring Bradley Stokes: Treating Bradley Stokes/Extender: Bradley Stokes Weeks in Treatment: 10 Clinic Level of Care Assessment Items TOOL 4 Quantity Score X- 1 0 Use when only an EandM is performed on FOLLOW-UP visit ASSESSMENTS - Nursing Assessment /  Reassessment X- 1 10 Reassessment of Co-morbidities (includes updates in patient status) X- 1 5 Reassessment of Adherence to Treatment Plan ASSESSMENTS - Wound and Skin A ssessment / Reassessment X - Simple Wound Assessment / Reassessment - one wound 1 5 []  - 0 Complex Wound Assessment / Reassessment - multiple wounds []  - 0 Dermatologic / Skin Assessment (not related to wound area) ASSESSMENTS - Focused Assessment []  - 0 Circumferential Edema Measurements - multi extremities []  - 0 Nutritional Assessment / Counseling / Intervention []  - 0 Lower Extremity Assessment (monofilament, tuning fork, pulses) []  - 0 Peripheral Arterial Disease Assessment (using hand held doppler) ASSESSMENTS - Ostomy and/or Continence Assessment and Care []  - 0 Incontinence Assessment and Management []  - 0 Ostomy Care Assessment and Management (repouching, etc.) PROCESS - Coordination of Care X - Simple Patient / Family Education for ongoing care 1 15 []  - 0 Complex (extensive) Patient / Family Education for ongoing care X- 1 10 Staff obtains Programmer, systems, Records, T Results / Process Orders est []  - 0 Staff telephones HHA, Nursing Homes / Clarify orders / etc []  - 0 Routine Transfer to another Facility (non-emergent condition) []  - 0 Routine Hospital Admission (non-emergent condition) []  - 0 New Admissions / Biomedical engineer / Ordering NPWT Apligraf, etc. , []  - 0 Emergency Hospital Admission (emergent condition) X- 1 10 Simple Discharge Coordination []  - 0 Complex (extensive) Discharge Coordination PROCESS - Special Needs []  - 0 Pediatric / Minor Patient Management []  - 0 Isolation Patient Management []  - 0 Hearing / Language / Visual special needs []  - 0 Assessment of Community assistance (transportation, D/C planning, etc.) []  - 0 Additional assistance / Altered mentation []  - 0 Support Surface(s) Assessment (bed, cushion, seat, etc.) INTERVENTIONS - Wound Cleansing /  Measurement X - Simple Wound Cleansing - one wound 1 5 []  - 0 Complex Wound Cleansing - multiple wounds X- 1 5 Wound Imaging (photographs -  any number of wounds) []  - 0 Wound Tracing (instead of photographs) X- 1 5 Simple Wound Measurement - one wound []  - 0 Complex Wound Measurement - multiple wounds INTERVENTIONS - Wound Dressings []  - 0 Small Wound Dressing one or multiple wounds []  - 0 Medium Wound Dressing one or multiple wounds []  - 0 Large Wound Dressing one or multiple wounds []  - 0 Application of Medications - topical []  - 0 Application of Medications - injection INTERVENTIONS - Miscellaneous []  - 0 External ear exam []  - 0 Specimen Collection (cultures, biopsies, blood, body fluids, etc.) []  - 0 Specimen(s) / Culture(s) sent or taken to Lab for analysis []  - 0 Patient Transfer (multiple staff / Civil Service fast streamer / Similar devices) []  - 0 Simple Staple / Suture removal (25 or less) []  - 0 Complex Staple / Suture removal (26 or more) []  - 0 Hypo / Hyperglycemic Management (close monitor of Blood Glucose) []  - 0 Ankle / Brachial Index (ABI) - do not check if billed separately X- 1 5 Vital Signs Has the patient been seen at the hospital within the last three years: Yes Total Score: 75 Level Of Care: New/Established - Level 2 Electronic Signature(s) Signed: 07/09/2020 5:30:45 PM By: Bradley Hurst RN, BSN Entered By: Bradley Stokes on 07/09/2020 17:04:26 -------------------------------------------------------------------------------- Encounter Discharge Information Details Patient Name: Date of Service: Bradley Stokes 07/09/2020 10:45 A M Medical Record Number: [id number] Patient Account Number: [id number] Date of Birth/Sex: Treating RN: 1945/06/27 (75 y.o. Bradley Stokes Primary Care Bradley Stokes: Bradley Stokes Other Clinician: Referring [name]: Treating Jerry Haugen/Extender: Bradley Stokes in Treatment: 10 Encounter Discharge  Information Items Discharge Condition: Stable Ambulatory Status: Cane Discharge Destination: Home Transportation: Private Auto Accompanied By: self Schedule Follow-up Appointment: No Clinical Summary of Care: Notes Juxtalite applied to left leg. Demonstrated how to apply. Patient in agreement. Electronic Signature(s) Signed: 07/09/2020 5:44:53 PM By: Deon Pilling Entered By: Deon Pilling on 07/09/2020 12:16:42 -------------------------------------------------------------------------------- Multi Wound Chart Details Patient Name: Date of Service: Bradley Stokes, Bradley Stokes 07/09/2020 10:45 A M Medical Record Number: [id number] Patient Account Number: [id number] Date of Birth/Sex: Treating RN: 21-May-1945 (75 y.o. Bradley Stokes Primary Care Bufford Helms: Bradley Stokes Other Clinician: Referring Manuel Lawhead: Treating Miniya Miguez/Extender: Bradley Stokes Weeks in Treatment: 10 Vital Signs Height(in): 69 Pulse(bpm): 81 Weight(lbs): 275 Blood Pressure(mmHg): 175/80 Body Mass Index(BMI): 41 Temperature(F): 98.5 Respiratory Rate(breaths/min): 17 Photos: [6:No Photos Left, Posterior Lower Leg] [N/A:N/A N/A] Wound Location: [6:Gradually Appeared] [N/A:N/A] Wounding Event: [6:Lymphedema] [N/A:N/A] Primary Etiology: [6:Cataracts, Lymphedema, Arrhythmia,] [N/A:N/A] Comorbid History: [6:Hypertension, Myocardial Infarction, Peripheral Venous Disease, Gout, Osteoarthritis 04/29/2020] [N/A:N/A] Date Acquired: [6:9] [N/A:N/A] Weeks of Treatment: [6:Healed - Epithelialized] [N/A:N/A] Wound Status: [6:0x0x0] [N/A:N/A] Measurements L x W x D (cm) [6:0] [N/A:N/A] A (cm) : rea [6:0] [N/A:N/A] Volume (cm) : [6:100.00%] [N/A:N/A] % Reduction in Area: [6:100.00%] [N/A:N/A] % Reduction in Volume: [6:Full Thickness Without Exposed] [N/A:N/A] Classification: [6:Support Structures] Treatment Notes Electronic Signature(s) Signed: 07/09/2020 4:45:43 PM By: Linton Ham MD Signed:  07/09/2020 5:30:45 PM By: Bradley Hurst RN, BSN Entered By: Linton Ham on 07/09/2020 12:06:56 -------------------------------------------------------------------------------- Multi-Disciplinary Care Plan Details Patient Name: Date of Service: Bradley Stokes, Bradley Stokes 07/09/2020 10:45 A M Medical Record Number: [id number] Patient Account Number: [id number] Date of Birth/Sex: Treating RN: [birth year] (75 y.o. Bradley Stokes Primary Care Jackey Housey: Bradley Stokes Other Clinician: Referring Damara Klunder: Treating Asianae Minkler/Extender: Bradley Stokes in Treatment: 10 Active Inactive Electronic Signature(s) Signed: 07/09/2020 5:30:45 PM By: Bradley Hurst RN,  BSN Entered By: Bradley Stokes on 07/09/2020 17:03:00 -------------------------------------------------------------------------------- Pain Assessment Details Patient Name: Date of Service: Bradley Stokes, Bradley Stokes 07/09/2020 10:45 A M Medical Record Number: [id number] Patient Account Number: [id number] Date of Birth/Sex: Treating RN: [birth year] (75 y.o. Bradley Stokes Primary Care Jarvis Knodel: Bradley Stokes Other Clinician: Referring Tayjon Halladay: Treating Dhani Imel/Extender: Bradley Stokes Weeks in Treatment: 10 Active Problems Location of Pain Severity and Description of Pain Patient Has Paino No Site Locations Pain Management and Medication Current Pain Management: Electronic Signature(s) Signed: 07/09/2020 5:30:45 PM By: Bradley Hurst RN, BSN Signed: 07/11/2020 8:29:36 AM By: Bradley Stokes Entered By: Bradley Stokes on 07/09/2020 11:28:03 -------------------------------------------------------------------------------- Patient/Caregiver Education Details Patient Name: Date of Service: Bradley Stokes 4/11/2022andnbsp10:45 A M Medical Record Number: [id number] Patient Account Number: [id number] Date of Birth/Gender: Treating RN: 03/08/1946 (75 y.o. Bradley Stokes Primary  Care Physician: Bradley Stokes Other Clinician: Referring Physician: Treating Physician/Extender: Bradley Stokes in Treatment: 10 Education Assessment Education Provided To: Patient Education Topics Provided Wound/Skin Impairment: Methods: Explain/Verbal Responses: State content correctly [employer]) Signed: 07/09/2020 5:30:45 PM By: Bradley Hurst RN, BSN Entered By: Bradley Stokes on 07/09/2020 17:03:10 -------------------------------------------------------------------------------- Wound Assessment Details Patient Name: Date of Service: Bradley Stokes, Bradley Stokes 07/09/2020 10:45 A M Medical Record Number: [id number] Patient Account Number: [id number] Date of Birth/Sex: Treating RN: [birth year] (75 y.o. [name] Primary Care Chele Cornell: Bradley Stokes Other Clinician: Referring Simcha Speir: Treating Kerrilynn Derenzo/Extender: Bradley Stokes Weeks in Treatment: 10 Wound Status Wound Number: 6 Primary Lymphedema Etiology: Wound Location: Left, Posterior Lower Leg Wound Healed - Epithelialized Wounding Event: Gradually Appeared Status: Date Acquired: 04/29/2020 Comorbid Cataracts, Lymphedema, Arrhythmia, Hypertension, Myocardial Weeks Of Treatment: 9 History: Infarction, Peripheral Venous Disease, Gout, Osteoarthritis Clustered Wound: No Wound Measurements Length: (cm) Width: (cm) Depth: (cm) Area: (cm) Volume: (cm) 0 % Reduction in Area: 100% 0 % Reduction in Volume: 100% 0 0 0 Wound Description Classification: Full Thickness Without Exposed Support Structur es Electronic Signature(s) Signed: 07/09/2020 5:21:09 PM By: [name] Entered By: [name] on 07/09/2020 11:32:49 -------------------------------------------------------------------------------- Vitals Details Patient Name: Date of Service: Bradley Stokes 07/09/2020 10:45 A M Medical Record Number: [id number] Patient Account Number:  [id number] Date of Birth/Sex: Treating RN: July 07, 1945 (75 y.o. Bradley Stokes Primary Care Nikka Hakimian: Bradley Stokes Other Clinician: Referring Calayah Guadarrama: Treating Ieesha Abbasi/Extender: Bradley Stokes Weeks in Treatment: 10 Vital Signs Time Taken: 11:27 Temperature (F): 98.5 Height (in): 69 Pulse (bpm): 81 Weight (lbs): 275 Respiratory Rate (breaths/min): 17 Body Mass Index (BMI): 40.6 Blood Pressure (mmHg): 175/80 Reference Range: 80 - 120 mg / dl Electronic Signature(s) Signed: 07/11/2020 8:29:36 AM By: Bradley Stokes Entered By: Bradley Stokes on 07/09/2020 11:27:57

## 2020-07-20 ENCOUNTER — Other Ambulatory Visit: Payer: Self-pay

## 2020-07-20 MED ORDER — POTASSIUM CHLORIDE CRYS ER 20 MEQ PO TBCR: EXTENDED_RELEASE_TABLET | ORAL | 3 refills | Status: DC

## 2020-07-24 ENCOUNTER — Telehealth: Payer: Self-pay

## 2020-07-24 NOTE — Telephone Encounter (Signed)
I connected by phone with Bradley Stokes and/or patient's caregiver on 07/24/2020 at 10:48 AM to discuss the potential vaccination through our Homebound vaccination initiative.   Prevaccination Checklist for COVID-19 Vaccines  1.  Are you feeling sick today? no  2.  Have you ever received a dose of a COVID-19 vaccine?  yes      If yes, which one? Pfizer   How many dose of Covid-19 vaccine have your received and dates ? 3, 06/11/2019, 07/02/2019, 02/21/2020   Check all that apply: I live in a long-term care setting. no  I have been diagnosed with a medical condition(s). Please list: _______________________ (pertinent to homebound status)  I am a first responder. no  I work in a long-term care facility, correctional facility, hospital, restaurant, retail setting, school, or other setting with high exposure to the public. no  4. Do you have a health condition or are you undergoing treatment that makes you moderately or severely immunocompromised? (This would include treatment for cancer or HIV, receipt of organ transplant, immunosuppressive therapy or high-dose corticosteroids, CAR-T-cell therapy, hematopoietic cell transplant [HCT], DiGeorge syndrome or Wiskott-Aldrich syndrome)  no  5. Have you received hematopoietic cell transplant (HCT) or CAR-T-cell therapies since receiving COVID-19 vaccine? no  6.  Have you ever had an allergic reaction: (This would include a severe reaction [ e.g., anaphylaxis] that required treatment with epinephrine or EpiPen or that caused you to go to the hospital.  It would also include an allergic reaction that occurred within 4 hours that caused hives, swelling, or respiratory distress, including wheezing.) A.  A previous dose of COVID-19 vaccine. no  B.  A vaccine or injectable therapy that contains multiple components, one of which is a COVID-19 vaccine component, but it is not known which component elicited the immediate reaction. no  C.  Are you allergic to  polyethylene glycol? no  D. Are you allergic to Polysorbate, which is found in some vaccines, film coated tablets and intravenous steroids?  no   7.  Have you ever had an allergic reaction to another vaccine (other than COVID-19 vaccine) or an injectable medication? (This would include a severe reaction [ e.g., anaphylaxis] that required treatment with epinephrine or EpiPen or that caused you to go to the hospital.  It would also include an allergic reaction that occurred within 4 hours that caused hives, swelling, or respiratory distress, including wheezing.)  no   8.  Have you ever had a severe allergic reaction (e.g., anaphylaxis) to something other than a component of the COVID-19 vaccine, or any vaccine or injectable medication?  This would include food, pet, venom, environmental, or oral medication allergies.  no   Check all that apply to you:  Am a male between ages 46 and 12 years old  no  Women 34 through 77 years of age can receive any FDA-authorized or -approved COVID-19 vaccine. However, they should be informed of the rare but increased risk of thrombosis with thrombocytopenia syndrome (TTS) after receipt of the [employer] Vaccine and the availability of other FDA-authorized and -approved COVID-19 vaccines. People who had TTS after a first dose of Janssen vaccine should not receive a subsequent dose of Janssen product    Am a male between ages 29 and 28 years old  no Males 5 through 77 years of age may receive the correct formulation of Pfizer-BioNTech COVID-19 vaccine. Males 18 and older can receive any FDA-authorized or -approved vaccine. However, people receiving an mRNA COVID-19 vaccine, especially  males 50 through 77 years of age and their parents/legal representative (when relevant), should be informed of the risk of developing myocarditis (an inflammation of the heart muscle) or pericarditis (inflammation of the lining around the heart) after receipt of an mRNA vaccine. The  risk of developing either myocarditis or pericarditis after vaccination is low, and lower than the risk of myocarditis associated with SARS-CoV-2 infection in adolescents and adults. Vaccine recipients should be counseled about the need to seek care if symptoms of myocarditis or pericarditis develop after vaccination     Have a history of myocarditis or pericarditis  no Myocarditis or pericarditis after receipt of the first dose of an mRNA COVID-19 vaccine series but before administration of the second dose  Experts advise that people who develop myocarditis or pericarditis after a dose of an mRNA COVID-19 vaccine not receive a subsequent dose of any COVID-19 vaccine, until additional safety data are available.  Administration of a subsequent dose of COVID-19 vaccine before safety data are available can be considered in certain circumstances after the episode of myocarditis or pericarditis has completely resolved. Until additional data are available, some experts recommend a Alphonsa Overall COVID-19 vaccine be considered instead of an mRNA COVID-19 vaccine. Decisions about proceeding with a subsequent dose should include a conversation between the patient, their parent/legal representative (when relevant), and their clinical team, which may include a cardiologist.    Have been treated with monoclonal antibodies or convalescent serum to prevent or treat COVID-19  no Vaccination should be offered to people regardless of history of prior symptomatic or asymptomatic SARS-CoV-2 infection. There is no recommended minimal interval between infection and vaccination.  However, vaccination should be deferred if a patient received monoclonal antibodies or convalescent serum as treatment for COVID-19 or for post-exposure prophylaxis. This is a precautionary measure until additional information becomes available, to avoid interference of the antibody treatment with vaccine-induced immune responses.  Defer COVID-19 vaccination  for 30 days when a passive antibody product was used for post-exposure prophylaxis.  Defer COVID-19 vaccination for 90 days when a passive antibody product was used to treat COVID-19.     Diagnosed with Multisystem Inflammatory Syndrome (MIS-C or MIS-A) after a COVID-19 infection  no It is unknown if people with a history of MIS-C or MIS-A are at risk for a dysregulated immune response to COVID-19 vaccination.  People with a history of MIS-C or MIS-A may choose to be vaccinated. Considerations for vaccination may include:   Clinical recovery from MIS-C or MIS-A, including return to normal cardiac function   Personal risk of severe acute COVID-19 (e.g., age, underlying conditions)   High or substantial community transmission of SARS-CoV-2 and personal increased risk of reinfection.   Timing of any immunomodulatory therapies (general best practice guidelines for immunization can be consulted for more information [email or web address])   It has been 90 days or more since their diagnosis of MIS-C   Onset of MIS-C occurred before any COVID-19 vaccination   A conversation between the patient, their guardian(s), and their clinical team or a specialist may assist with COVID-19 vaccination decisions. Healthcare providers and health departments may also request a consultation from the [place] at [email or web address].     Have a bleeding disorder  no Take a blood thinner  yes, patient is on Coumadin. As with all vaccines, any COVID-19 vaccine product may be given to these patients, if a physician familiar with the patient's bleeding risk determines that the vaccine can be administered intramuscularly with  reasonable safety.  ACIP recommends the following technique for intramuscular vaccination in patients with bleeding disorders or taking blood thinners: a fine-gauge needle  (23-gauge or smaller caliber) should be used for the vaccination, followed by firm pressure on the site, without rubbing, for at least 2 minutes.  People who regularly take aspirin or anticoagulants as part of their routine medications do not need to stop these medications prior to receipt of any COVID-19 vaccine.    Have a history of heparin-induced thrombocytopenia (HIT)  no Although the etiology of TTS associated with the Alphonsa Overall COVID-19 vaccine is unclear, it appears to be similar to another rare immune-mediated syndrome, heparin-induced thrombocytopenia (HIT). People with a history of an episode of an immune-mediated syndrome characterized by thrombosis and thrombocytopenia, such as HIT, should be offered a currently FDA-approved or FDA-authorized mRNA COVID-19 vaccine if it has been ?90 days since their TTS resolved. After 90 days, patients may be vaccinated with any currently FDA-approved or FDA-authorized COVID-19 vaccine, including Janssen COVID-19 Vaccine. However, people who developed TTS after their initial Alphonsa Overall vaccine should not receive a Janssen booster dose.  Experts believe the following factors do not make people more susceptible to TTS after receipt of the [employer]. People with these conditions can be vaccinated with any FDA-authorized or - approved COVID-19 vaccine, including the [employer] COVID-19 Vaccine:   A prior history of venous thromboembolism   Risk factors for venous thromboembolism (e.g., inherited or acquired thrombophilia including Factor V Leiden; prothrombin gene 20210A mutation; antiphospholipid syndrome; protein C, protein S or antithrombin deficiency   A prior history of other types of thromboses not associated with thrombocytopenia   Pregnancy, post-partum status, or receipt of hormonal contraceptives (e.g., combined oral contraceptives, patch, ring)   Additional recipient education materials can be found at [email or web address]-[email or web address]  vaccines/safety/JJUpdate.html.    Am currently pregnant or breastfeeding  no Vaccination is recommended for all people aged 59 years and older, including people that are:   Pregnant   Breastfeeding   Trying to get pregnant now or who might become pregnant in the future   Pregnant, breastfeeding, and post-partum people 50 through 77 years of age should be aware of the rare risk of TTS after receipt of the Alphonsa Overall COVID-19 Vaccine and the availability of other FDA-authorized or -approved COVID-19 vaccines (i.e., mRNA vaccines).    Have received dermal fillers  no FDA-authorized or -approved COVID-19 vaccines can be administered to people who have received injectable dermal fillers who have no contraindications for vaccination.  Infrequently, these people might experience temporary swelling at or near the site of filler injection (usually the face or lips) following administration of a dose of an mRNA COVID-19 vaccine. These people should be advised to contact their healthcare provider if swelling develops at or near the site of dermal filler following vaccination.     Have a history of Guillain-Barr Syndrome (GBS)  no People with a history of GBS can receive any FDA-authorized or -approved COVID-19 vaccine. However, given the possible association between the [employer] and an increased risk of GBS, a patient with a history of GBS and their clinical team should discuss the availability of mRNA vaccines to offer protection against COVID-19. The highest risk has been observed in men aged 58-64 years with symptoms of GBS beginning within 42 days after Alphonsa Overall COVID-19 vaccination.  People who had GBS after receiving Janssen vaccine should be made aware of the option to receive an mRNA COVID-19 vaccine booster at least 2 months (  8 weeks) after the Janssen dose. However, Alphonsa Overall vaccine may be used as a booster, particularly if GBS occurred more than 42 days after vaccination or was related  to a non-vaccine factor. Prior to booster vaccination, a conversation between the patient and their clinical team may assist with decisions about use of a COVID-19 booster dose, including the timing of administration     Postvaccination Observation Times for People without Contraindications to Covid 19 Vaccination.  30 minutes:  People with a history of: A contraindication to another type of COVID-19 vaccine product (i.e., mRNA or viral vector COVID-19 vaccines)   Immediate (within 4 hours of exposure) non-severe allergic reaction to a COVID-19 vaccine or injectable therapies   Anaphylaxis due to any cause   Immediate allergic reaction of any severity to a non-COVID-19 vaccine   15 minutes: All other people  This patient is a 75 y.o. male that meets the FDA criteria to receive homebound vaccination. Patient or parent/caregiver understands they have the option to accept or refuse homebound vaccination.  Patient passed the pre-screening checklist and would like to proceed with homebound vaccination.  Based on questionnaire above, I recommend the patient be observed for 30 minutes.  There are an estimated #1 other household members/caregivers who are also interested in receiving the vaccine.    The patient has been confirmed homebound and eligible for homebound vaccination with the considerations outlined above. I will send the patient's information to our scheduling team who will reach out to schedule the patient and potential caregiver/family members for homebound vaccination.    Dan Humphreys 07/24/2020 10:48 AM

## 2020-08-08 DIAGNOSIS — I480 Paroxysmal atrial fibrillation: Secondary | ICD-10-CM | POA: Diagnosis not present

## 2020-08-08 DIAGNOSIS — Z7901 Long term (current) use of anticoagulants: Secondary | ICD-10-CM | POA: Diagnosis not present

## 2020-08-16 ENCOUNTER — Ambulatory Visit: Payer: Medicare HMO | Attending: Critical Care Medicine

## 2020-08-16 DIAGNOSIS — Z23 Encounter for immunization: Secondary | ICD-10-CM

## 2020-08-16 NOTE — Progress Notes (Signed)
   Covid-19 Vaccination Clinic  Name:  Bradley Stokes    MRN: [id number] DOB: Oct 17, 1945  08/16/2020  [name] was observed post Covid-19 immunization for 15 minutes without incident. He was provided with Vaccine Information Sheet and instruction to access the V-Safe system.   [name] was instructed to call 911 with any severe reactions post vaccine: Marland Kitchen Difficulty breathing  . Swelling of face and throat  . A fast heartbeat  . A bad rash all over body  . Dizziness and weakness   Immunizations Administered    Name Date Dose VIS Date Route   PFIZER Comrnaty(Gray TOP) Covid-19 Vaccine 08/16/2020 10:41 AM 0.3 mL 03/08/2020 Intramuscular   Manufacturer: [employer]   Lot: [id number]   NDC: [phone]

## 2020-08-22 DIAGNOSIS — I129 Hypertensive chronic kidney disease with stage 1 through stage 4 chronic kidney disease, or unspecified chronic kidney disease: Secondary | ICD-10-CM | POA: Diagnosis not present

## 2020-08-22 DIAGNOSIS — E876 Hypokalemia: Secondary | ICD-10-CM | POA: Diagnosis not present

## 2020-08-22 DIAGNOSIS — I4891 Unspecified atrial fibrillation: Secondary | ICD-10-CM | POA: Diagnosis not present

## 2020-08-22 DIAGNOSIS — D631 Anemia in chronic kidney disease: Secondary | ICD-10-CM | POA: Diagnosis not present

## 2020-08-22 DIAGNOSIS — N183 Chronic kidney disease, stage 3 unspecified: Secondary | ICD-10-CM | POA: Diagnosis not present

## 2020-08-22 DIAGNOSIS — N2581 Secondary hyperparathyroidism of renal origin: Secondary | ICD-10-CM | POA: Diagnosis not present

## 2020-08-22 DIAGNOSIS — E785 Hyperlipidemia, unspecified: Secondary | ICD-10-CM | POA: Diagnosis not present

## 2020-08-22 DIAGNOSIS — Z6841 Body Mass Index (BMI) 40.0 and over, adult: Secondary | ICD-10-CM | POA: Diagnosis not present

## 2020-09-05 DIAGNOSIS — Z7901 Long term (current) use of anticoagulants: Secondary | ICD-10-CM | POA: Diagnosis not present

## 2020-09-05 DIAGNOSIS — I480 Paroxysmal atrial fibrillation: Secondary | ICD-10-CM | POA: Diagnosis not present

## 2020-10-03 DIAGNOSIS — Z7901 Long term (current) use of anticoagulants: Secondary | ICD-10-CM | POA: Diagnosis not present

## 2020-10-03 DIAGNOSIS — I480 Paroxysmal atrial fibrillation: Secondary | ICD-10-CM | POA: Diagnosis not present

## 2020-10-29 DIAGNOSIS — M109 Gout, unspecified: Secondary | ICD-10-CM | POA: Diagnosis not present

## 2020-10-29 DIAGNOSIS — I480 Paroxysmal atrial fibrillation: Secondary | ICD-10-CM | POA: Diagnosis not present

## 2020-10-29 DIAGNOSIS — Z125 Encounter for screening for malignant neoplasm of prostate: Secondary | ICD-10-CM | POA: Diagnosis not present

## 2020-10-29 DIAGNOSIS — I10 Essential (primary) hypertension: Secondary | ICD-10-CM | POA: Diagnosis not present

## 2020-10-29 DIAGNOSIS — N183 Chronic kidney disease, stage 3 unspecified: Secondary | ICD-10-CM | POA: Diagnosis not present

## 2020-10-29 DIAGNOSIS — Z7901 Long term (current) use of anticoagulants: Secondary | ICD-10-CM | POA: Diagnosis not present

## 2020-10-29 DIAGNOSIS — I8310 Varicose veins of unspecified lower extremity with inflammation: Secondary | ICD-10-CM | POA: Diagnosis not present

## 2020-10-29 DIAGNOSIS — E7800 Pure hypercholesterolemia, unspecified: Secondary | ICD-10-CM | POA: Diagnosis not present

## 2020-10-29 DIAGNOSIS — C801 Malignant (primary) neoplasm, unspecified: Secondary | ICD-10-CM

## 2020-10-29 HISTORY — DX: Malignant (primary) neoplasm, unspecified: C80.1

## 2020-12-02 ENCOUNTER — Encounter (HOSPITAL_COMMUNITY): Payer: Self-pay | Admitting: Emergency Medicine

## 2020-12-02 ENCOUNTER — Observation Stay (HOSPITAL_COMMUNITY): Payer: Medicare HMO

## 2020-12-02 ENCOUNTER — Other Ambulatory Visit: Payer: Self-pay

## 2020-12-02 ENCOUNTER — Inpatient Hospital Stay (HOSPITAL_COMMUNITY)
Admission: EM | Admit: 2020-12-02 | Discharge: 2020-12-13 | DRG: 330 | Disposition: A | Discharge status: Skilled Nursing Facility | Payer: Medicare HMO | Attending: Internal Medicine | Admitting: Internal Medicine

## 2020-12-02 DIAGNOSIS — E7800 Pure hypercholesterolemia, unspecified: Secondary | ICD-10-CM

## 2020-12-02 DIAGNOSIS — N1832 Chronic kidney disease, stage 3b: Secondary | ICD-10-CM | POA: Diagnosis present

## 2020-12-02 DIAGNOSIS — I130 Hypertensive heart and chronic kidney disease with heart failure and stage 1 through stage 4 chronic kidney disease, or unspecified chronic kidney disease: Secondary | ICD-10-CM | POA: Diagnosis present

## 2020-12-02 DIAGNOSIS — K922 Gastrointestinal hemorrhage, unspecified: Secondary | ICD-10-CM | POA: Diagnosis present

## 2020-12-02 DIAGNOSIS — Z515 Encounter for palliative care: Secondary | ICD-10-CM | POA: Diagnosis not present

## 2020-12-02 DIAGNOSIS — K921 Melena: Secondary | ICD-10-CM | POA: Diagnosis not present

## 2020-12-02 DIAGNOSIS — D649 Anemia, unspecified: Secondary | ICD-10-CM | POA: Diagnosis not present

## 2020-12-02 DIAGNOSIS — Z7901 Long term (current) use of anticoagulants: Secondary | ICD-10-CM | POA: Diagnosis not present

## 2020-12-02 DIAGNOSIS — M6281 Muscle weakness (generalized): Secondary | ICD-10-CM | POA: Diagnosis not present

## 2020-12-02 DIAGNOSIS — R110 Nausea: Secondary | ICD-10-CM | POA: Diagnosis not present

## 2020-12-02 DIAGNOSIS — K317 Polyp of stomach and duodenum: Secondary | ICD-10-CM | POA: Diagnosis not present

## 2020-12-02 DIAGNOSIS — J9811 Atelectasis: Secondary | ICD-10-CM | POA: Diagnosis not present

## 2020-12-02 DIAGNOSIS — E46 Unspecified protein-calorie malnutrition: Secondary | ICD-10-CM | POA: Diagnosis not present

## 2020-12-02 DIAGNOSIS — D120 Benign neoplasm of cecum: Secondary | ICD-10-CM | POA: Diagnosis not present

## 2020-12-02 DIAGNOSIS — I10 Essential (primary) hypertension: Secondary | ICD-10-CM | POA: Diagnosis not present

## 2020-12-02 DIAGNOSIS — Z66 Do not resuscitate: Secondary | ICD-10-CM | POA: Diagnosis not present

## 2020-12-02 DIAGNOSIS — N281 Cyst of kidney, acquired: Secondary | ICD-10-CM | POA: Diagnosis not present

## 2020-12-02 DIAGNOSIS — M170 Bilateral primary osteoarthritis of knee: Secondary | ICD-10-CM | POA: Diagnosis present

## 2020-12-02 DIAGNOSIS — C779 Secondary and unspecified malignant neoplasm of lymph node, unspecified: Secondary | ICD-10-CM | POA: Diagnosis not present

## 2020-12-02 DIAGNOSIS — Z7189 Other specified counseling: Secondary | ICD-10-CM | POA: Diagnosis not present

## 2020-12-02 DIAGNOSIS — C775 Secondary and unspecified malignant neoplasm of intrapelvic lymph nodes: Secondary | ICD-10-CM | POA: Diagnosis present

## 2020-12-02 DIAGNOSIS — R58 Hemorrhage, not elsewhere classified: Secondary | ICD-10-CM | POA: Diagnosis not present

## 2020-12-02 DIAGNOSIS — K5731 Diverticulosis of large intestine without perforation or abscess with bleeding: Secondary | ICD-10-CM | POA: Diagnosis not present

## 2020-12-02 DIAGNOSIS — E538 Deficiency of other specified B group vitamins: Secondary | ICD-10-CM | POA: Diagnosis not present

## 2020-12-02 DIAGNOSIS — I2510 Atherosclerotic heart disease of native coronary artery without angina pectoris: Secondary | ICD-10-CM | POA: Diagnosis not present

## 2020-12-02 DIAGNOSIS — M109 Gout, unspecified: Secondary | ICD-10-CM | POA: Diagnosis present

## 2020-12-02 DIAGNOSIS — C189 Malignant neoplasm of colon, unspecified: Secondary | ICD-10-CM | POA: Diagnosis not present

## 2020-12-02 DIAGNOSIS — E785 Hyperlipidemia, unspecified: Secondary | ICD-10-CM | POA: Diagnosis present

## 2020-12-02 DIAGNOSIS — K3189 Other diseases of stomach and duodenum: Secondary | ICD-10-CM | POA: Diagnosis not present

## 2020-12-02 DIAGNOSIS — I5030 Unspecified diastolic (congestive) heart failure: Secondary | ICD-10-CM | POA: Diagnosis present

## 2020-12-02 DIAGNOSIS — K6389 Other specified diseases of intestine: Secondary | ICD-10-CM | POA: Diagnosis not present

## 2020-12-02 DIAGNOSIS — Z6841 Body Mass Index (BMI) 40.0 and over, adult: Secondary | ICD-10-CM | POA: Diagnosis not present

## 2020-12-02 DIAGNOSIS — Z8249 Family history of ischemic heart disease and other diseases of the circulatory system: Secondary | ICD-10-CM | POA: Diagnosis not present

## 2020-12-02 DIAGNOSIS — Z23 Encounter for immunization: Secondary | ICD-10-CM

## 2020-12-02 DIAGNOSIS — D490 Neoplasm of unspecified behavior of digestive system: Secondary | ICD-10-CM | POA: Diagnosis not present

## 2020-12-02 DIAGNOSIS — I480 Paroxysmal atrial fibrillation: Secondary | ICD-10-CM | POA: Diagnosis present

## 2020-12-02 DIAGNOSIS — R600 Localized edema: Secondary | ICD-10-CM | POA: Diagnosis not present

## 2020-12-02 DIAGNOSIS — R1111 Vomiting without nausea: Secondary | ICD-10-CM | POA: Diagnosis not present

## 2020-12-02 DIAGNOSIS — D62 Acute posthemorrhagic anemia: Secondary | ICD-10-CM | POA: Diagnosis present

## 2020-12-02 DIAGNOSIS — D630 Anemia in neoplastic disease: Secondary | ICD-10-CM | POA: Diagnosis not present

## 2020-12-02 DIAGNOSIS — Z48815 Encounter for surgical aftercare following surgery on the digestive system: Secondary | ICD-10-CM | POA: Diagnosis not present

## 2020-12-02 DIAGNOSIS — D513 Other dietary vitamin B12 deficiency anemia: Secondary | ICD-10-CM | POA: Diagnosis not present

## 2020-12-02 DIAGNOSIS — K567 Ileus, unspecified: Secondary | ICD-10-CM | POA: Diagnosis not present

## 2020-12-02 DIAGNOSIS — I482 Chronic atrial fibrillation, unspecified: Secondary | ICD-10-CM | POA: Diagnosis not present

## 2020-12-02 DIAGNOSIS — D539 Nutritional anemia, unspecified: Secondary | ICD-10-CM | POA: Diagnosis present

## 2020-12-02 DIAGNOSIS — R1110 Vomiting, unspecified: Secondary | ICD-10-CM | POA: Diagnosis not present

## 2020-12-02 DIAGNOSIS — R238 Other skin changes: Secondary | ICD-10-CM | POA: Diagnosis not present

## 2020-12-02 DIAGNOSIS — M7989 Other specified soft tissue disorders: Secondary | ICD-10-CM | POA: Diagnosis present

## 2020-12-02 DIAGNOSIS — I4891 Unspecified atrial fibrillation: Secondary | ICD-10-CM | POA: Diagnosis not present

## 2020-12-02 DIAGNOSIS — K635 Polyp of colon: Secondary | ICD-10-CM | POA: Diagnosis not present

## 2020-12-02 DIAGNOSIS — C772 Secondary and unspecified malignant neoplasm of intra-abdominal lymph nodes: Secondary | ICD-10-CM | POA: Diagnosis not present

## 2020-12-02 DIAGNOSIS — N179 Acute kidney failure, unspecified: Secondary | ICD-10-CM | POA: Diagnosis not present

## 2020-12-02 DIAGNOSIS — I5032 Chronic diastolic (congestive) heart failure: Secondary | ICD-10-CM | POA: Diagnosis not present

## 2020-12-02 DIAGNOSIS — N183 Chronic kidney disease, stage 3 unspecified: Secondary | ICD-10-CM | POA: Diagnosis not present

## 2020-12-02 DIAGNOSIS — Z20822 Contact with and (suspected) exposure to covid-19: Secondary | ICD-10-CM | POA: Diagnosis not present

## 2020-12-02 DIAGNOSIS — Z841 Family history of disorders of kidney and ureter: Secondary | ICD-10-CM | POA: Diagnosis not present

## 2020-12-02 DIAGNOSIS — D509 Iron deficiency anemia, unspecified: Secondary | ICD-10-CM | POA: Diagnosis not present

## 2020-12-02 DIAGNOSIS — C186 Malignant neoplasm of descending colon: Secondary | ICD-10-CM | POA: Diagnosis present

## 2020-12-02 DIAGNOSIS — W19XXXA Unspecified fall, initial encounter: Secondary | ICD-10-CM

## 2020-12-02 DIAGNOSIS — K648 Other hemorrhoids: Secondary | ICD-10-CM | POA: Diagnosis present

## 2020-12-02 DIAGNOSIS — I700 Atherosclerosis of aorta: Secondary | ICD-10-CM | POA: Diagnosis not present

## 2020-12-02 DIAGNOSIS — Z79899 Other long term (current) drug therapy: Secondary | ICD-10-CM | POA: Diagnosis not present

## 2020-12-02 DIAGNOSIS — I959 Hypotension, unspecified: Secondary | ICD-10-CM | POA: Diagnosis not present

## 2020-12-02 DIAGNOSIS — D123 Benign neoplasm of transverse colon: Secondary | ICD-10-CM | POA: Diagnosis not present

## 2020-12-02 DIAGNOSIS — S2242XA Multiple fractures of ribs, left side, initial encounter for closed fracture: Secondary | ICD-10-CM | POA: Diagnosis not present

## 2020-12-02 DIAGNOSIS — R109 Unspecified abdominal pain: Secondary | ICD-10-CM | POA: Diagnosis not present

## 2020-12-02 DIAGNOSIS — Z87442 Personal history of urinary calculi: Secondary | ICD-10-CM | POA: Diagnosis not present

## 2020-12-02 DIAGNOSIS — I872 Venous insufficiency (chronic) (peripheral): Secondary | ICD-10-CM | POA: Diagnosis not present

## 2020-12-02 DIAGNOSIS — K5730 Diverticulosis of large intestine without perforation or abscess without bleeding: Secondary | ICD-10-CM | POA: Diagnosis not present

## 2020-12-02 DIAGNOSIS — K7689 Other specified diseases of liver: Secondary | ICD-10-CM | POA: Diagnosis present

## 2020-12-02 DIAGNOSIS — Z7401 Bed confinement status: Secondary | ICD-10-CM | POA: Diagnosis not present

## 2020-12-02 DIAGNOSIS — Z9049 Acquired absence of other specified parts of digestive tract: Secondary | ICD-10-CM | POA: Diagnosis not present

## 2020-12-02 DIAGNOSIS — I517 Cardiomegaly: Secondary | ICD-10-CM | POA: Diagnosis not present

## 2020-12-02 DIAGNOSIS — K625 Hemorrhage of anus and rectum: Secondary | ICD-10-CM | POA: Diagnosis not present

## 2020-12-02 DIAGNOSIS — C188 Malignant neoplasm of overlapping sites of colon: Secondary | ICD-10-CM | POA: Diagnosis not present

## 2020-12-02 DIAGNOSIS — C185 Malignant neoplasm of splenic flexure: Secondary | ICD-10-CM

## 2020-12-02 LAB — CBC WITH DIFFERENTIAL/PLATELET
Abs Immature Granulocytes: 0.02 10*3/uL (ref 0.00–0.07)
Basophils Absolute: 0 10*3/uL (ref 0.0–0.1)
Basophils Relative: 1 %
Eosinophils Absolute: 0.1 10*3/uL (ref 0.0–0.5)
Eosinophils Relative: 2 %
HCT: 31.7 % — ABNORMAL LOW (ref 39.0–52.0)
Hemoglobin: 9.7 g/dL — ABNORMAL LOW (ref 13.0–17.0)
Immature Granulocytes: 0 %
Lymphocytes Relative: 19 %
Lymphs Abs: 1.3 10*3/uL (ref 0.7–4.0)
MCH: 31.1 pg (ref 26.0–34.0)
MCHC: 30.6 g/dL (ref 30.0–36.0)
MCV: 101.6 fL — ABNORMAL HIGH (ref 80.0–100.0)
Monocytes Absolute: 0.5 10*3/uL (ref 0.1–1.0)
Monocytes Relative: 7 %
Neutro Abs: 5 10*3/uL (ref 1.7–7.7)
Neutrophils Relative %: 71 %
Platelets: 340 10*3/uL (ref 150–400)
RBC: 3.12 MIL/uL — ABNORMAL LOW (ref 4.22–5.81)
RDW: 13.8 % (ref 11.5–15.5)
WBC: 7 10*3/uL (ref 4.0–10.5)
nRBC: 0 % (ref 0.0–0.2)

## 2020-12-02 LAB — FOLATE: Folate: 7.8 ng/mL (ref >5.9)

## 2020-12-02 LAB — COMPREHENSIVE METABOLIC PANEL
ALT: 6 U/L (ref 0–44)
AST: 14 U/L — ABNORMAL LOW (ref 15–41)
Albumin: 3.1 g/dL — ABNORMAL LOW (ref 3.5–5.0)
Alkaline Phosphatase: 108 U/L (ref 38–126)
Anion gap: 9 (ref 5–15)
BUN: 16 mg/dL (ref 8–23)
CO2: 26 mmol/L (ref 22–32)
Calcium: 8.8 mg/dL — ABNORMAL LOW (ref 8.9–10.3)
Chloride: 106 mmol/L (ref 98–111)
Creatinine, Ser: 2.07 mg/dL — ABNORMAL HIGH (ref 0.61–1.24)
GFR, Estimated: 32 mL/min — ABNORMAL LOW (ref >60)
Glucose, Bld: 104 mg/dL — ABNORMAL HIGH (ref 70–99)
Potassium: 3.5 mmol/L (ref 3.5–5.1)
Sodium: 141 mmol/L (ref 135–145)
Total Bilirubin: 1 mg/dL (ref 0.3–1.2)
Total Protein: 6.4 g/dL — ABNORMAL LOW (ref 6.5–8.1)

## 2020-12-02 LAB — PROTIME-INR
INR: 2.9 — ABNORMAL HIGH (ref 0.8–1.2)
Prothrombin Time: 30.4 seconds — ABNORMAL HIGH (ref 11.4–15.2)

## 2020-12-02 LAB — HEMOGLOBIN AND HEMATOCRIT, BLOOD
HCT: 30.9 % — ABNORMAL LOW (ref 39.0–52.0)
HCT: 30.3 % — ABNORMAL LOW (ref 39.0–52.0)
Hemoglobin: 9.9 g/dL — ABNORMAL LOW (ref 13.0–17.0)
Hemoglobin: 9.4 g/dL — ABNORMAL LOW (ref 13.0–17.0)

## 2020-12-02 LAB — TYPE AND SCREEN
ABO/RH(D): O NEG
Antibody Screen: NEGATIVE

## 2020-12-02 LAB — SARS CORONAVIRUS 2 (TAT 6-24 HRS): SARS Coronavirus 2: NEGATIVE

## 2020-12-02 LAB — VITAMIN B12: Vitamin B-12: 51 pg/mL — ABNORMAL LOW (ref 180–914)

## 2020-12-02 LAB — ABO/RH: ABO/RH(D): O NEG

## 2020-12-02 MED ORDER — CYANOCOBALAMIN 1000 MCG/ML IJ SOLN
1000.0000 ug | Freq: Once | INTRAMUSCULAR | Status: AC
  Administered 2020-12-02: 1000 ug via INTRAMUSCULAR
  Filled 2020-12-02: qty 1

## 2020-12-02 MED ORDER — PANTOPRAZOLE SODIUM 40 MG IV SOLR
40.0000 mg | Freq: Two times a day (BID) | INTRAVENOUS | Status: DC
  Administered 2020-12-02 – 2020-12-06 (×9): 40 mg via INTRAVENOUS
  Filled 2020-12-02 (×9): qty 40

## 2020-12-02 MED ORDER — ATORVASTATIN CALCIUM 10 MG PO TABS
20.0000 mg | ORAL_TABLET | Freq: Every day | ORAL | Status: DC
  Administered 2020-12-03 – 2020-12-13 (×9): 20 mg via ORAL
  Filled 2020-12-02 (×9): qty 2

## 2020-12-02 MED ORDER — POTASSIUM CHLORIDE CRYS ER 20 MEQ PO TBCR
20.0000 meq | EXTENDED_RELEASE_TABLET | Freq: Every day | ORAL | Status: DC
  Administered 2020-12-02 – 2020-12-09 (×6): 20 meq via ORAL
  Filled 2020-12-02 (×6): qty 1

## 2020-12-02 MED ORDER — ALBUTEROL SULFATE (2.5 MG/3ML) 0.083% IN NEBU: 2.5000 mg | INHALATION_SOLUTION | Freq: Four times a day (QID) | RESPIRATORY_TRACT | Status: DC | PRN

## 2020-12-02 MED ORDER — FUROSEMIDE 40 MG PO TABS
40.0000 mg | ORAL_TABLET | Freq: Two times a day (BID) | ORAL | Status: DC
  Administered 2020-12-02 – 2020-12-03 (×3): 40 mg via ORAL
  Filled 2020-12-02 (×3): qty 1

## 2020-12-02 MED ORDER — VIRT-PHOS 250 NEUTRAL 155-852-130 MG PO TABS: 1.0000 | ORAL_TABLET | Freq: Two times a day (BID) | ORAL | Status: DC

## 2020-12-02 MED ORDER — ALLOPURINOL 100 MG PO TABS
100.0000 mg | ORAL_TABLET | Freq: Every day | ORAL | Status: DC
  Administered 2020-12-03 – 2020-12-13 (×10): 100 mg via ORAL
  Filled 2020-12-02 (×10): qty 1

## 2020-12-02 MED ORDER — ACETAMINOPHEN 325 MG PO TABS
650.0000 mg | ORAL_TABLET | Freq: Four times a day (QID) | ORAL | Status: DC | PRN
  Administered 2020-12-04 – 2020-12-13 (×6): 650 mg via ORAL
  Filled 2020-12-02 (×6): qty 2

## 2020-12-02 MED ORDER — SODIUM CHLORIDE 0.9% FLUSH
3.0000 mL | Freq: Two times a day (BID) | INTRAVENOUS | Status: DC
  Administered 2020-12-02 – 2020-12-13 (×21): 3 mL via INTRAVENOUS

## 2020-12-02 MED ORDER — ACETAMINOPHEN 650 MG RE SUPP: 650.0000 mg | Freq: Four times a day (QID) | RECTAL | Status: DC | PRN

## 2020-12-02 MED ORDER — K PHOS MONO-SOD PHOS DI & MONO 155-852-130 MG PO TABS
250.0000 mg | ORAL_TABLET | Freq: Two times a day (BID) | ORAL | Status: DC
  Administered 2020-12-02 – 2020-12-09 (×10): 250 mg via ORAL
  Filled 2020-12-02 (×11): qty 1

## 2020-12-02 MED ORDER — AMMONIUM LACTATE 12 % EX LOTN
1.0000 "application " | TOPICAL_LOTION | Freq: Two times a day (BID) | CUTANEOUS | Status: DC
  Administered 2020-12-02 – 2020-12-13 (×21): 1 via TOPICAL
  Filled 2020-12-02: qty 225

## 2020-12-02 MED ORDER — AMIODARONE HCL 200 MG PO TABS
100.0000 mg | ORAL_TABLET | Freq: Every day | ORAL | Status: DC
  Administered 2020-12-03 – 2020-12-13 (×11): 100 mg via ORAL
  Filled 2020-12-02 (×11): qty 1

## 2020-12-02 MED ORDER — CALCITRIOL 0.25 MCG PO CAPS
0.5000 ug | ORAL_CAPSULE | Freq: Every day | ORAL | Status: DC
  Administered 2020-12-02 – 2020-12-13 (×10): 0.5 ug via ORAL
  Filled 2020-12-02 (×10): qty 2

## 2020-12-02 NOTE — ED Provider Notes (Signed)
[facility] EMERGENCY DEPARTMENT Provider Note   CSN: [id number] Arrival date & time: 12/02/20  0838     History Chief Complaint  Patient presents with   Rectal Bleeding    Bradley Stokes is a 75 y.o. male.  Pt is a 75 yo male presenting for blood in stool, described as dark red blood, x 1 week, worsening in severity this morning. Denies previous hx of rectal bleeding/GI bleed. Admits to sore near rectal that normally is scant bright red blood. Denies light headedness, dizziness, or recent syncope. Denies nausea, vomiting, or abdominal pain. On Warfarin for afib.   The history is provided by the patient. No language interpreter was used.  Rectal Bleeding Quality:  Maroon Amount:  Moderate Timing:  Intermittent Chronicity:  New Associated symptoms: no abdominal pain, no fever and no vomiting       Past Medical History:  Diagnosis Date   Arthritis    severe arthritis and deformity of his knee's   Chest pain    Chronic atrial fibrillation (HCC)    Chronic renal disease, stage III (HCC)    Chronic venous stasis dermatitis    Edema    Gout    History of cardiovascular stress test 01/17/2008   EF- 62%   Hypercholesterolemia    Hyperlipidemia    Hypertension    Kidney stones    Long term (current) use of anticoagulants    Long-term (current) use of anticoagulants    Obesity    Osteoarthritis    PAF (paroxysmal atrial fibrillation) (HCC)    Paroxysmal atrial fibrillation (HCC)    Secondary hypercoagulable state ([place])     Patient Active Problem List   Diagnosis Date Noted   GI bleed 12/02/2020   Heart murmur previously undiagnosed 01/01/2015   Long term use of drug 09/05/2011   PAF (paroxysmal atrial fibrillation) ([place])    Edema    Long term current use of anticoagulant therapy    Hyperlipidemia    Hypertension     Past Surgical History:  Procedure Laterality Date   CARDIOLITE STRESS TEST     EF 62%       Family History  Problem  Relation Age of Onset   Pneumonia Mother    Heart failure Mother    Kidney failure Sister    Heart disease Brother    Crohn's disease Sister     Social History   Tobacco Use   Smoking status: Never   Smokeless tobacco: Never  Vaping Use   Vaping Use: Never used  Substance Use Topics   Alcohol use: No   Drug use: No    Home Medications Prior to Admission medications   Medication Sig Start Date End Date Taking? Authorizing Provider  acetaminophen (TYLENOL) 325 MG tablet Take 325 mg by mouth every 6 (six) hours as needed (pain).    Yes [provider]  allopurinol (ZYLOPRIM) 100 MG tablet Take 100 mg by mouth daily.   Yes [provider]  amiodarone (PACERONE) 200 MG tablet 1/2 tablet daily 12/21/19  Yes Truitt Merle C, NP  ammonium lactate (LAC-HYDRIN) 12 % lotion Apply 1 application topically 2 (two) times daily.   Yes [provider]  atorvastatin (LIPITOR) 20 MG tablet Take 20 mg by mouth Daily. 02/21/11  Yes [provider]  calcitRIOL (ROCALTROL) 0.5 MCG capsule Take 0.5 mcg daily by mouth.  02/07/17  Yes [provider]  furosemide (LASIX) 20 MG tablet TAKE 2 TABLETS BY MOUTH TWICE  DAILY 01/03/13  Yes Darlin Coco, MD  K Phos Mono-Sod Phos Sharma Covert & Mono (VIRT-PHOS 250 NEUTRAL) [phone] MG TABS Take 1 tablet by mouth 2 (two) times a day.  08/03/18  Yes [provider]  potassium chloride SA (KLOR-CON) 20 MEQ tablet TAKE 1 TABLET BY MOUTH DAILY 07/20/20  Yes Jerline Pain, MD  warfarin (COUMADIN) 2 MG tablet Take 2-4 mg by mouth one time only at 6 PM. 4 mg Monday wedensday Friday 2.5 tab all other days 04/08/16  Yes [provider]    Allergies    Other  Review of Systems   Review of Systems  Constitutional:  Negative for chills and fever.  HENT:  Negative for ear pain and sore throat.   Eyes:  Negative for pain and visual disturbance.  Respiratory:  Negative for cough and shortness of breath.    Cardiovascular:  Negative for chest pain and palpitations.  Gastrointestinal:  Positive for blood in stool and hematochezia. Negative for abdominal pain and vomiting.  Genitourinary:  Negative for dysuria and hematuria.  Musculoskeletal:  Negative for arthralgias and back pain.  Skin:  Negative for color change and rash.  Neurological:  Negative for seizures and syncope.  All other systems reviewed and are negative.  Physical Exam Updated Vital Signs BP (!) 159/59   Pulse 71   Temp 97.8 F (36.6 C) (Oral)   Resp 20   SpO2 100%   Physical Exam Vitals and nursing note reviewed.  Constitutional:      Appearance: He is well-developed.  HENT:     Head: Normocephalic and atraumatic.  Eyes:     Conjunctiva/sclera: Conjunctivae normal.  Cardiovascular:     Rate and Rhythm: Normal rate and regular rhythm.     Heart sounds: No murmur heard. Pulmonary:     Effort: Pulmonary effort is normal. No respiratory distress.     Breath sounds: Normal breath sounds.  Abdominal:     Palpations: Abdomen is soft.     Tenderness: There is no abdominal tenderness.  Musculoskeletal:     Cervical back: Neck supple.  Skin:    General: Skin is warm and dry.  Neurological:     Mental Status: He is alert.    ED Results / Procedures / Treatments   Labs (all labs ordered are listed, but only abnormal results are displayed) Labs Reviewed  CBC WITH DIFFERENTIAL/PLATELET - Abnormal; Notable for the following components:      Result Value   RBC 3.12 (*)    Hemoglobin 9.7 (*)    HCT 31.7 (*)    MCV 101.6 (*)    All other components within normal limits  COMPREHENSIVE METABOLIC PANEL - Abnormal; Notable for the following components:   Glucose, Bld 104 (*)    Creatinine, Ser 2.07 (*)    Calcium 8.8 (*)    Total Protein 6.4 (*)    Albumin 3.1 (*)    AST 14 (*)    GFR, Estimated 32 (*)    All other components within normal limits  PROTIME-INR - Abnormal; Notable for the following components:    Prothrombin Time 30.4 (*)    INR 2.9 (*)    All other components within normal limits  SARS CORONAVIRUS 2 (TAT 6-24 HRS)  FOLATE  VITAMIN B12  HEMOGLOBIN AND HEMATOCRIT, BLOOD  HEMOGLOBIN AND HEMATOCRIT, BLOOD  POC OCCULT BLOOD, ED  TYPE AND SCREEN  ABO/RH    EKG None  Radiology No results found.  Procedures Procedures  Medications Ordered in ED Medications  sodium chloride flush (NS) 0.9 % injection 3 mL (3 mLs Intravenous Given 12/02/20 1145)  acetaminophen (TYLENOL) tablet 650 mg (has no administration in time range)    Or  acetaminophen (TYLENOL) suppository 650 mg (has no administration in time range)  albuterol (PROVENTIL) (2.5 MG/3ML) 0.083% nebulizer solution 2.5 mg (has no administration in time range)    ED Course  I have reviewed the triage vital signs and the nursing notes.  Pertinent labs & imaging results that were available during my care of the patient were reviewed by me and considered in my medical decision making (see chart for details).    MDM Rules/Calculators/A&P                          11:49 AM 75 yo male presenting for blood in stool, described as dark red blood, x 1 week, worsening in severity this morning.   Hemoglobin stable at 9.7, however 3.4 drop from patient's baseline of 13.1. GU exam demonstrates small, uninvolved skin tear at 12 o'clock position to rectum, with gross dark red blood from rectum. Pt endorses feelings of weakness. Stable INR at 2.9, no reversal recommended at this time. Recommended to hold Coumadin at this time and admit for observation of hemoglobin levels and bleeding severity. Pt accepted by hospitalist.    Final Clinical Impression(s) / ED Diagnoses Final diagnoses:  Gastrointestinal hemorrhage, unspecified gastrointestinal hemorrhage type  Anemia, unspecified type    Rx / DC Orders ED Discharge Orders     None        Lianne Cure, DO 94/32/76 1149

## 2020-12-02 NOTE — ED Triage Notes (Addendum)
Pt to triage via [facility] from home.  2 episodes of dark blood in stools since last night.  Denies abd pain.  Pt fell onto someone's walker 1 month ago and hit L abd.  States abd was sore but no longer painful.  Takes Coumadin.

## 2020-12-02 NOTE — Progress Notes (Signed)
Pt admitted to 5W06 from ED. A&O x4, VS stable, Tele monitor verified. Skin intact, BLE dry, scaly, edema noted. R AC IV, SL All questions/concerns addressed. Call bell within reach.   12/02/20 1235  Vitals  Temp 98 F (36.7 C)  Temp Source Oral  BP (!) 167/63  MAP (mmHg) 91  BP Location Left Arm  BP Method Automatic  Patient Position (if appropriate) Lying  Pulse Rate 80  Pulse Rate Source Monitor  ECG Heart Rate 80  Resp (!) 21  Level of Consciousness  Level of Consciousness Alert  MEWS COLOR  MEWS Score Color Green  Oxygen Therapy  SpO2 100 %  O2 Device Room Air  Pain Assessment  Pain Score 0  MEWS Score  MEWS Temp 0  MEWS Systolic 0  MEWS Pulse 0  MEWS RR 1  MEWS LOC 0  MEWS Score 1

## 2020-12-02 NOTE — H&P (Signed)
History and Physical    Bradley Stokes:[id number] DOB: 1945-05-24 DOA: 12/02/2020  Referring MD/NP/PA: Campbell Stall, DO PCP: [name], MD  Patient coming from: Home via EMS  Chief Complaint:  " Blood coming from out my anus" I have personally briefly reviewed patient's old medical records in [place]   HPI: Bradley Stokes is a 75 y.o. male with medical history significant of hypertension, hyperlipidemia, paroxysmal atrial fibrillation on Coumadin, osteoarthritis, and obesity who presents with complaints of bleeding rectally.  For the last week patient reported that he had intermittently been seeing blood present most notably when he was wiping, but symptoms seem to go away.  Yesterday patient reported having two bowel movements with  "chocolate red" blood present, and this morning had a big watery stool with a whole lot of blood present in the toilet bowl.  He called EMS as he did not feel quite right, but denies having any lightheadedness or loss of consciousness.  Denies previously  having any issues with bleeding in the past, but does admit to bruising easily as he is on Coumadin.  Coumadin was started due to history of atrial fibrillation several years ago, but to his knowledge he has not gone back into atrial fibrillation.  His last colonoscopy may have likely been over 10 years ago, but he cannot really recall if it showed anything concerning.  He chronically has lower extremity leg swelling that is unchanged.  Patient admits to having a fall about a month ago where he fell and landed onto a walker.  He had some discomfort on his left side and bruising, but reports that that has resolved.  ED Course: Upon admission into the emergency department l patient blood pressures were 136/72-162/72, and all other vital signs maintained.  Labs significant for hemoglobin 9.7 g/dL with MCV 101.6 (previously hemoglobin 13.1 in 05/2020), BUN 16, creatinine 2.07, and albumin 3.1.  Rectal  exam noted maroon-colored stools.  Patient was typed and screened and [facility] called to admit.  Review of Systems  Constitutional:  Positive for malaise/fatigue. Negative for fever.  HENT:  Negative for ear discharge and nosebleeds.   Eyes:  Negative for photophobia and discharge.  Respiratory:  Negative for cough and shortness of breath.   Cardiovascular:  Positive for leg swelling. Negative for chest pain.  Gastrointestinal:  Positive for blood in stool. Negative for abdominal pain, nausea and vomiting.  Genitourinary:  Negative for hematuria.  Musculoskeletal:  Negative for falls.  Neurological:  Negative for focal weakness and loss of consciousness.  Psychiatric/Behavioral:  Negative for substance abuse.    Past Medical History:  Diagnosis Date   Arthritis    severe arthritis and deformity of his knee's   Chest pain    Chronic atrial fibrillation (HCC)    Chronic renal disease, stage III (HCC)    Chronic venous stasis dermatitis    Edema    Gout    History of cardiovascular stress test 01/17/2008   EF- 62%   Hypercholesterolemia    Hyperlipidemia    Hypertension    Kidney stones    Long term (current) use of anticoagulants    Long-term (current) use of anticoagulants    Obesity    Osteoarthritis    PAF (paroxysmal atrial fibrillation) (HCC)    Paroxysmal atrial fibrillation ([place])    Secondary hypercoagulable state ([place])     Past Surgical History:  Procedure Laterality Date   CARDIOLITE STRESS TEST     EF 62%  reports that he has never smoked. He has never used smokeless tobacco. He reports that he does not drink alcohol and does not use drugs.  Allergies  Allergen Reactions   Other     Tape,needs paper tape    Family History  Problem Relation Age of Onset   Pneumonia Mother    Heart failure Mother    Kidney failure Sister    Heart disease Brother    Crohn's disease Sister     Prior to Admission medications   Medication Sig Start Date End Date Taking?  Authorizing Provider  acetaminophen (TYLENOL) 325 MG tablet Take 325 mg by mouth every 6 (six) hours as needed (pain).     [provider]  allopurinol (ZYLOPRIM) 100 MG tablet Take 100 mg by mouth daily.    [provider]  amiodarone (PACERONE) 200 MG tablet 1/2 tablet daily 12/21/19   Burtis Junes, NP  ammonium lactate (LAC-HYDRIN) 12 % lotion Apply 1 application topically 2 (two) times daily.    [provider]  atorvastatin (LIPITOR) 20 MG tablet Take 20 mg by mouth Daily. 02/21/11   [provider]  calcitRIOL (ROCALTROL) 0.5 MCG capsule Take 0.5 mcg daily by mouth.  02/07/17   [provider]  furosemide (LASIX) 20 MG tablet TAKE 2 TABLETS BY MOUTH TWICE DAILY 01/03/13   Darlin Coco, MD  K Phos Mono-Sod Phos Di & Mono (VIRT-PHOS 250 NEUTRAL) [phone] MG TABS Take 1 tablet by mouth 2 (two) times a day.  08/03/18   [provider]  potassium chloride SA (KLOR-CON) 20 MEQ tablet TAKE 1 TABLET BY MOUTH DAILY 07/20/20   Jerline Pain, MD  warfarin (COUMADIN) 2 MG tablet Take 4 mg by mouth one time only at 6 PM.  04/08/16   [provider]    Physical Exam:  Constitutional: Obese elderly male currently in no acute distress. Vitals:   12/02/20 0845 12/02/20 1000  BP: 136/72 (!) 162/72  Pulse: 81 91  Resp: 16 16  Temp: 97.8 F (36.6 C)   TempSrc: Oral   SpO2: 97% 100%   Eyes: PERRL, lids and conjunctivae normal ENMT: Mucous membranes are moist. Posterior pharynx clear of any exudate or lesions. Edentulous.   Neck: normal, supple, no masses, no thyromegaly Respiratory: clear to auscultation bilaterally, no wheezing, no crackles. Normal respiratory effort. No accessory muscle use.  Cardiovascular: Regular rate and rhythm, positive systolic murmur.  +1 pitting lower extremity edema. 2+ pedal pulses. No carotid bruits.  Abdomen: no tenderness, no masses palpated. No hepatosplenomegaly. Bowel sounds positive.   Musculoskeletal: no clubbing / cyanosis. No joint deformity upper and lower extremities. Good ROM, no contractures. .  Skin: Venous stasis changes of the bilateral lower extremities without open wounds appreciated at this time Neurologic: CN 2-12 grossly intact. Sensation intact, DTR normal. Strength 5/5 in all 4.  Psychiatric: Normal judgment and insight. Alert and oriented x 3. Normal mood.     Labs on Admission: I have personally reviewed following labs and imaging studies  CBC: Recent Labs  Lab 12/02/20 0911  WBC 7.0  NEUTROABS 5.0  HGB 9.7*  HCT 31.7*  MCV 101.6*  PLT 481   Basic Metabolic Panel: Recent Labs  Lab 12/02/20 0911  NA 141  K 3.5  CL 106  CO2 26  GLUCOSE 104*  BUN 16  CREATININE 2.07*  CALCIUM 8.8*   GFR: CrCl cannot be calculated (Unknown ideal weight.). Liver Function Tests: Recent Labs  Lab 12/02/20 [phone]  AST 14*  ALT 6  ALKPHOS 108  BILITOT 1.0  PROT 6.4*  ALBUMIN 3.1*   No results for input(s): LIPASE, AMYLASE in the last 168 hours. No results for input(s): AMMONIA in the last 168 hours. Coagulation Profile: Recent Labs  Lab 12/02/20 0911  INR 2.9*   Cardiac Enzymes: No results for input(s): CKTOTAL, CKMB, CKMBINDEX, TROPONINI in the last 168 hours. BNP (last 3 results) No results for input(s): PROBNP in the last 8760 hours. HbA1C: No results for input(s): HGBA1C in the last 72 hours. CBG: No results for input(s): GLUCAP in the last 168 hours. Lipid Profile: No results for input(s): CHOL, HDL, LDLCALC, TRIG, CHOLHDL, LDLDIRECT in the last 72 hours. Thyroid Function Tests: No results for input(s): TSH, T4TOTAL, FREET4, T3FREE, THYROIDAB in the last 72 hours. Anemia Panel: No results for input(s): VITAMINB12, FOLATE, FERRITIN, TIBC, IRON, RETICCTPCT in the last 72 hours. Urine analysis: No results found for: COLORURINE, APPEARANCEUR, LABSPEC, PHURINE, GLUCOSEU, HGBUR, BILIRUBINUR, KETONESUR, PROTEINUR, UROBILINOGEN, NITRITE,  LEUKOCYTESUR Sepsis Labs: No results found for this or any previous visit (from the past 240 hour(s)).   Radiological Exams on Admission: No results found.  EKG: Independently reviewed.  Accelerated junctional rhythm at 73 bpm with low voltage in precordial leads.  Assessment/Plan Acute blood loss anemia secondary to GI bleed macrocytic anemia: Patient presents with complaints of dark stools.  Hemoglobin previously 13.1 g/dL back in 3/15, but acutely found to have drop down to 9.7 g/dL.  Stool guaiacs were noted to be positive.  Risk factors include patient on anticoagulation of Coumadin with INR noted to be 2.9.  He was otherwise noted to be hemodynamically stable at this time.  Suspect patient likely had a diverticular bleed. -Admit to a medical telemetry bed -Monitor intake and output -Serial monitoring of H&H -Check vitamin B12 and folate and replace as needed -Consider need of blood transfusion for hemoglobin less than 8 g/dL given cardiac history -Dr. Stacie Glaze GI was consulted and we will see tomorrow.  Patient was noted to have previously had a colonoscopy 20 years ago with Dr. Collene Mares which showed left-sided diverticulosis and hemorrhoids  Paroxysmal atrial fibrillation on chronic anticoagulation: Patient appears to be in sinus rhythm at this time and INR was therapeutic at 2.9.  -Hold Coumadin, an will reverse INR if hemoglobin continues to trend down or patient appears to be unstable -Continue amiodarone -Daily monitoring of PT/INR  Heart failure with preserved EF: Chronic.  Last EF noted to be 55 to 60% by echocardiogram in 2016.   -Strict intake and output -Daily weights  Chronic kidney disease stage IIIb: Creatinine 2.07 which appears near patient's baseline. -Continue to monitor  Hyperlipidemia -Continue atorvastatin  DVT prophylaxis: SCDs Code Status: Full Family Communication: Plan of care updated with his best friend present at bedside Disposition Plan:  Hopefully discharge home once medically stable Consults called: [facility] Admission status: Observation, possibly requiring less than 2 midnight stay  Norval Morton MD [facility]   If 7PM-7AM, please contact night-coverage   12/02/2020, 11:06 AM

## 2020-12-02 NOTE — ED Provider Notes (Signed)
Emergency Medicine Provider Triage Evaluation Note  Bradley Stokes , a 75 y.o. male  was evaluated in triage.  Pt complains of GI bleeding starting last night. 2 episodes since last night. Describes it as dark until it hits the water and then it is bright. Endorses "queasiness" in abdomen. Denies nausea or vomiting. + anticoagulant use. Denies EtoH use   Review of Systems  Positive: Melena, fatigue,  Negative: Abdominal pain, vomiting or hematemesis, hematuria, fever, syncope  Physical Exam  BP 136/72 (BP Location: Right Arm)   Pulse 81   Temp 97.8 F (36.6 C) (Oral)   Resp 16   SpO2 97%  Gen:   Awake, no distress,alert and oriented x3 Cardiac: S1/S2 without murmur, rub, gallop. Radial pulses 2+  Resp:  Normal effort, lungs clear GI:  TTP of RUQ/RLQ. Abdomen protuberant, but soft, +BS   Medical Decision Making  Medically screening exam initiated at 8:59 AM.  Appropriate orders placed.  KARLIS CREGG was informed that the remainder of the evaluation will be completed by another provider, this initial triage assessment does not replace that evaluation, and the importance of remaining in the ED until their evaluation is complete.     Mickie Hillier, PA-C 12/02/20 0100    Luna Fuse, MD 12/05/20 [phone]

## 2020-12-03 DIAGNOSIS — Z6841 Body Mass Index (BMI) 40.0 and over, adult: Secondary | ICD-10-CM | POA: Diagnosis not present

## 2020-12-03 DIAGNOSIS — Z515 Encounter for palliative care: Secondary | ICD-10-CM | POA: Diagnosis not present

## 2020-12-03 DIAGNOSIS — D513 Other dietary vitamin B12 deficiency anemia: Secondary | ICD-10-CM | POA: Diagnosis present

## 2020-12-03 DIAGNOSIS — E7800 Pure hypercholesterolemia, unspecified: Secondary | ICD-10-CM | POA: Diagnosis not present

## 2020-12-03 DIAGNOSIS — K922 Gastrointestinal hemorrhage, unspecified: Secondary | ICD-10-CM | POA: Diagnosis not present

## 2020-12-03 DIAGNOSIS — D649 Anemia, unspecified: Secondary | ICD-10-CM | POA: Diagnosis not present

## 2020-12-03 DIAGNOSIS — C779 Secondary and unspecified malignant neoplasm of lymph node, unspecified: Secondary | ICD-10-CM | POA: Diagnosis not present

## 2020-12-03 DIAGNOSIS — I2510 Atherosclerotic heart disease of native coronary artery without angina pectoris: Secondary | ICD-10-CM | POA: Diagnosis not present

## 2020-12-03 DIAGNOSIS — C189 Malignant neoplasm of colon, unspecified: Secondary | ICD-10-CM | POA: Diagnosis not present

## 2020-12-03 DIAGNOSIS — N1832 Chronic kidney disease, stage 3b: Secondary | ICD-10-CM | POA: Diagnosis present

## 2020-12-03 DIAGNOSIS — Z79899 Other long term (current) drug therapy: Secondary | ICD-10-CM | POA: Diagnosis not present

## 2020-12-03 DIAGNOSIS — Z841 Family history of disorders of kidney and ureter: Secondary | ICD-10-CM | POA: Diagnosis not present

## 2020-12-03 DIAGNOSIS — M7989 Other specified soft tissue disorders: Secondary | ICD-10-CM | POA: Diagnosis present

## 2020-12-03 DIAGNOSIS — K6389 Other specified diseases of intestine: Secondary | ICD-10-CM | POA: Diagnosis not present

## 2020-12-03 DIAGNOSIS — I700 Atherosclerosis of aorta: Secondary | ICD-10-CM | POA: Diagnosis not present

## 2020-12-03 DIAGNOSIS — I480 Paroxysmal atrial fibrillation: Secondary | ICD-10-CM | POA: Diagnosis present

## 2020-12-03 DIAGNOSIS — Z7189 Other specified counseling: Secondary | ICD-10-CM | POA: Diagnosis not present

## 2020-12-03 DIAGNOSIS — C186 Malignant neoplasm of descending colon: Secondary | ICD-10-CM | POA: Diagnosis present

## 2020-12-03 DIAGNOSIS — I5030 Unspecified diastolic (congestive) heart failure: Secondary | ICD-10-CM | POA: Diagnosis not present

## 2020-12-03 DIAGNOSIS — C775 Secondary and unspecified malignant neoplasm of intrapelvic lymph nodes: Secondary | ICD-10-CM | POA: Diagnosis present

## 2020-12-03 DIAGNOSIS — D62 Acute posthemorrhagic anemia: Secondary | ICD-10-CM | POA: Diagnosis present

## 2020-12-03 DIAGNOSIS — I5032 Chronic diastolic (congestive) heart failure: Secondary | ICD-10-CM | POA: Diagnosis present

## 2020-12-03 DIAGNOSIS — Z23 Encounter for immunization: Secondary | ICD-10-CM | POA: Diagnosis present

## 2020-12-03 DIAGNOSIS — Z20822 Contact with and (suspected) exposure to covid-19: Secondary | ICD-10-CM | POA: Diagnosis present

## 2020-12-03 DIAGNOSIS — S2242XA Multiple fractures of ribs, left side, initial encounter for closed fracture: Secondary | ICD-10-CM | POA: Diagnosis not present

## 2020-12-03 DIAGNOSIS — J9811 Atelectasis: Secondary | ICD-10-CM | POA: Diagnosis not present

## 2020-12-03 DIAGNOSIS — D490 Neoplasm of unspecified behavior of digestive system: Secondary | ICD-10-CM | POA: Diagnosis not present

## 2020-12-03 DIAGNOSIS — K7689 Other specified diseases of liver: Secondary | ICD-10-CM | POA: Diagnosis present

## 2020-12-03 DIAGNOSIS — Z66 Do not resuscitate: Secondary | ICD-10-CM | POA: Diagnosis not present

## 2020-12-03 DIAGNOSIS — Z8249 Family history of ischemic heart disease and other diseases of the circulatory system: Secondary | ICD-10-CM | POA: Diagnosis not present

## 2020-12-03 DIAGNOSIS — K567 Ileus, unspecified: Secondary | ICD-10-CM | POA: Diagnosis not present

## 2020-12-03 DIAGNOSIS — K5730 Diverticulosis of large intestine without perforation or abscess without bleeding: Secondary | ICD-10-CM | POA: Diagnosis not present

## 2020-12-03 DIAGNOSIS — D539 Nutritional anemia, unspecified: Secondary | ICD-10-CM | POA: Diagnosis present

## 2020-12-03 DIAGNOSIS — N179 Acute kidney failure, unspecified: Secondary | ICD-10-CM | POA: Diagnosis present

## 2020-12-03 DIAGNOSIS — Z87442 Personal history of urinary calculi: Secondary | ICD-10-CM | POA: Diagnosis not present

## 2020-12-03 DIAGNOSIS — D630 Anemia in neoplastic disease: Secondary | ICD-10-CM | POA: Diagnosis present

## 2020-12-03 DIAGNOSIS — Z7901 Long term (current) use of anticoagulants: Secondary | ICD-10-CM | POA: Diagnosis not present

## 2020-12-03 DIAGNOSIS — I130 Hypertensive heart and chronic kidney disease with heart failure and stage 1 through stage 4 chronic kidney disease, or unspecified chronic kidney disease: Secondary | ICD-10-CM | POA: Diagnosis present

## 2020-12-03 LAB — BASIC METABOLIC PANEL
Anion gap: 9 (ref 5–15)
BUN: 18 mg/dL (ref 8–23)
CO2: 24 mmol/L (ref 22–32)
Calcium: 8.5 mg/dL — ABNORMAL LOW (ref 8.9–10.3)
Chloride: 107 mmol/L (ref 98–111)
Creatinine, Ser: 2.13 mg/dL — ABNORMAL HIGH (ref 0.61–1.24)
GFR, Estimated: 31 mL/min — ABNORMAL LOW (ref >60)
Glucose, Bld: 85 mg/dL (ref 70–99)
Potassium: 3.7 mmol/L (ref 3.5–5.1)
Sodium: 140 mmol/L (ref 135–145)

## 2020-12-03 LAB — CBC
HCT: 27.1 % — ABNORMAL LOW (ref 39.0–52.0)
HCT: 29.1 % — ABNORMAL LOW (ref 39.0–52.0)
HCT: 30.3 % — ABNORMAL LOW (ref 39.0–52.0)
Hemoglobin: 8.7 g/dL — ABNORMAL LOW (ref 13.0–17.0)
Hemoglobin: 9.1 g/dL — ABNORMAL LOW (ref 13.0–17.0)
Hemoglobin: 9.7 g/dL — ABNORMAL LOW (ref 13.0–17.0)
MCH: 31.8 pg (ref 26.0–34.0)
MCH: 31.5 pg (ref 26.0–34.0)
MCH: 31.6 pg (ref 26.0–34.0)
MCHC: 32.1 g/dL (ref 30.0–36.0)
MCHC: 31.3 g/dL (ref 30.0–36.0)
MCHC: 32 g/dL (ref 30.0–36.0)
MCV: 98.9 fL (ref 80.0–100.0)
MCV: 100.7 fL — ABNORMAL HIGH (ref 80.0–100.0)
MCV: 98.7 fL (ref 80.0–100.0)
Platelets: 287 10*3/uL (ref 150–400)
Platelets: 307 10*3/uL (ref 150–400)
Platelets: 357 10*3/uL (ref 150–400)
RBC: 2.74 MIL/uL — ABNORMAL LOW (ref 4.22–5.81)
RBC: 2.89 MIL/uL — ABNORMAL LOW (ref 4.22–5.81)
RBC: 3.07 MIL/uL — ABNORMAL LOW (ref 4.22–5.81)
RDW: 13.8 % (ref 11.5–15.5)
RDW: 13.9 % (ref 11.5–15.5)
RDW: 13.7 % (ref 11.5–15.5)
WBC: 8.7 10*3/uL (ref 4.0–10.5)
WBC: 8 10*3/uL (ref 4.0–10.5)
WBC: 9.9 10*3/uL (ref 4.0–10.5)
nRBC: 0 % (ref 0.0–0.2)
nRBC: 0 % (ref 0.0–0.2)
nRBC: 0 % (ref 0.0–0.2)

## 2020-12-03 LAB — PROTIME-INR
INR: 2.6 — ABNORMAL HIGH (ref 0.8–1.2)
Prothrombin Time: 28.1 seconds — ABNORMAL HIGH (ref 11.4–15.2)

## 2020-12-03 NOTE — Consult Note (Addendum)
Cross-covering for Dr. Mann/Dr. Benson Norway  Referring Provider: Dr. Tamala Julian, [facility] Primary Care Physician:  [name], MD Primary Gastroenterologist:  Dr. Collene Mares  Reason for Consultation:  GI bleeding  HPI: Bradley Stokes is a 75 y.o. male with medical history significant of hypertension, hyperlipidemia, paroxysmal atrial fibrillation on Coumadin, osteoarthritis, CKD stage 3b, and obesity who presents with complaints of GI bleeding.  For the last week patient reported that he had been intermittently seeing blood in his stool but it would go away and then happen again.  Yesterday (Sunday) patient reported having two bowel movements with "chocolate red" blood present first thing in the morning.  Was a big gush.  He called EMS as he did not feel quite right.  No nausea or vomiting.  No abdominal pain.  No dizziness per se.  Labs significant for hemoglobin 9.1 g/dL today with MCV 101.6 (previously hemoglobin 13.1 in 05/2020) but is stable compared to yesterday.  BUN normal.  Rectal exam noted maroon-colored stools in the ED.  His INR was 2.9 yesterday and is still 2.6 today as he was not given any reversal agent.   Has not passed anymore blood since his admission.  Colonoscopy 10 or more years ago with Dr. Collene Mares.  Past Medical History:  Diagnosis Date   Arthritis    severe arthritis and deformity of his knee's   Chest pain    Chronic atrial fibrillation (HCC)    Chronic renal disease, stage III (HCC)    Chronic venous stasis dermatitis    Edema    Gout    History of cardiovascular stress test 01/17/2008   EF- 62%   Hypercholesterolemia    Hyperlipidemia    Hypertension    Kidney stones    Long term (current) use of anticoagulants    Long-term (current) use of anticoagulants    Obesity    Osteoarthritis    PAF (paroxysmal atrial fibrillation) (HCC)    Paroxysmal atrial fibrillation (HCC)    Secondary hypercoagulable state ([place])     Past Surgical History:  Procedure Laterality  Date   CARDIOLITE STRESS TEST     EF 62%    Prior to Admission medications   Medication Sig Start Date End Date Taking? Authorizing Provider  acetaminophen (TYLENOL) 325 MG tablet Take 325 mg by mouth every 6 (six) hours as needed (pain).    Yes [provider]  allopurinol (ZYLOPRIM) 100 MG tablet Take 100 mg by mouth daily.   Yes [provider]  amiodarone (PACERONE) 200 MG tablet 1/2 tablet daily Patient taking differently: Take 100 mg by mouth daily. 1/2 tablet daily 12/21/19  Yes Truitt Merle C, NP  ammonium lactate (LAC-HYDRIN) 12 % lotion Apply 1 application topically 2 (two) times daily.   Yes [provider]  atorvastatin (LIPITOR) 20 MG tablet Take 20 mg by mouth Daily. 02/21/11  Yes [provider]  calcitRIOL (ROCALTROL) 0.5 MCG capsule Take 0.5 mcg daily by mouth.  02/07/17  Yes [provider]  furosemide (LASIX) 20 MG tablet TAKE 2 TABLETS BY MOUTH TWICE DAILY Patient taking differently: Take 40 mg by mouth 2 (two) times daily. 01/03/13  Yes Darlin Coco, MD  K Phos Mono-Sod Phos [employer] Covert & Mono (VIRT-PHOS 250 NEUTRAL) [phone] MG TABS Take 1 tablet by mouth 2 (two) times a day.  08/03/18  Yes [provider]  potassium chloride SA (KLOR-CON) 20 MEQ tablet TAKE 1 TABLET BY MOUTH DAILY Patient taking differently: Take 20 mEq by mouth  daily. TAKE 1 TABLET BY MOUTH DAILY 07/20/20  Yes Jerline Pain, MD  warfarin (COUMADIN) 2 MG tablet Take 4-5 mg by mouth one time only at 6 PM. 4 mg Monday, Wednesday & Friday. All other days- 2.5 tabs for 5 mg all other days 04/08/16  Yes [provider]    Current Facility-Administered Medications  Medication Dose Route Frequency Provider Last Rate Last Admin   acetaminophen (TYLENOL) tablet 650 mg  650 mg Oral Q6H PRN Norval Morton, MD       Or   acetaminophen (TYLENOL) suppository 650 mg  650 mg Rectal Q6H PRN Fuller Plan A, MD       albuterol (PROVENTIL) (2.5 MG/3ML) 0.083%  nebulizer solution 2.5 mg  2.5 mg Nebulization Q6H PRN Fuller Plan A, MD       allopurinol (ZYLOPRIM) tablet 100 mg  100 mg Oral Daily Smith, Rondell A, MD       amiodarone (PACERONE) tablet 100 mg  100 mg Oral Daily Smith, Rondell A, MD       ammonium lactate (LAC-HYDRIN) 12 % lotion 1 application  1 application Topical BID Fuller Plan A, MD   1 application at 62/95/28 2151   atorvastatin (LIPITOR) tablet 20 mg  20 mg Oral Daily Smith, Rondell A, MD       calcitRIOL (ROCALTROL) capsule 0.5 mcg  0.5 mcg Oral Daily Tamala Julian, Rondell A, MD   0.5 mcg at 12/02/20 1735   furosemide (LASIX) tablet 40 mg  40 mg Oral BID Fuller Plan A, MD   40 mg at 12/02/20 1734   pantoprazole (PROTONIX) injection 40 mg  40 mg Intravenous Q12H Smith, Rondell A, MD   40 mg at 12/02/20 2150   phosphorus (K PHOS NEUTRAL) tablet 250 mg  250 mg Oral BID Fuller Plan A, MD   250 mg at 12/02/20 2150   potassium chloride SA (KLOR-CON) CR tablet 20 mEq  20 mEq Oral Daily Tamala Julian, Rondell A, MD   20 mEq at 12/02/20 1735   sodium chloride flush (NS) 0.9 % injection 3 mL  3 mL Intravenous Q12H Smith, Rondell A, MD   3 mL at 12/02/20 2151    Allergies as of 12/02/2020 - Review Complete 12/02/2020  Allergen Reaction Noted   Other  09/05/2011    Family History  Problem Relation Age of Onset   Pneumonia Mother    Heart failure Mother    Kidney failure Sister    Heart disease Brother    Crohn's disease Sister     Social History   Socioeconomic History   Marital status: Married    Spouse name: Not on file   Number of children: Not on file   Years of education: Not on file   Highest education level: Not on file  Occupational History   Not on file  Tobacco Use   Smoking status: Never   Smokeless tobacco: Never  Vaping Use   Vaping Use: Never used  Substance and Sexual Activity   Alcohol use: No   Drug use: No   Sexual activity: Not on file  Other Topics Concern   Not on file  Social History Narrative    Not on file   Social Determinants of Health   Financial Resource Strain: Not on file  Food Insecurity: Not on file  Transportation Needs: Not on file  Physical Activity: Not on file  Stress: Not on file  Social Connections: Not on file  Intimate Partner Violence: Not on  file   Review of Systems: ROS is O/W negative except as mentioned in HPI.  Physical Exam: Vital signs in last 24 hours: Temp:  [97.7 F (36.5 C)-98.5 F (36.9 C)] 98.5 F (36.9 C) (09/05 0810) Pulse Rate:  [71-91] 76 (09/05 0810) Resp:  [16-22] 17 (09/05 0810) BP: (141-167)/(54-72) 148/63 (09/05 0810) SpO2:  [93 %-100 %] 97 % (09/05 0810) Weight:  [127.4 kg] 127.4 kg (09/05 0600) Last BM Date: 12/02/20 (PTA) General:  Alert, Well-developed, well-nourished, pleasant and cooperative in NAD Head:  Normocephalic and atraumatic. Eyes:  Sclera clear, no icterus.  Conjunctiva pink. Ears:  Normal auditory acuity. Mouth:  No deformity or lesions.   Lungs:  Clear throughout to auscultation.  No wheezes, crackles, or rhonchi.  Heart:  Regular rate and rhythm; no murmurs, clicks, rubs, or gallops. Abdomen:  Soft, non-distended.  BS present.  Non-tender. Msk:  Symmetrical without gross deformities. Pulses:  Normal pulses noted. Extremities:  1+ pitting edema in B/L LEs Neurologic:  Alert and oriented x 4;  grossly normal neurologically. Skin:  Intact without significant lesions or rashes. Psych:  Alert and cooperative. Normal mood and affect.  Intake/Output from previous day: 09/04 0701 - 09/05 0700 In: -  Out: 1800 [Urine:1800]  Lab Results: Recent Labs    12/02/20 0911 12/02/20 1212 12/02/20 1428 12/03/20 0056 12/03/20 0750  WBC 7.0  --   --  8.7 8.0  HGB 9.7*   < > 9.4* 8.7* 9.1*  HCT 31.7*   < > 30.3* 27.1* 29.1*  PLT 340  --   --  287 307   < > = values in this interval not displayed.   BMET Recent Labs    12/02/20 0911 12/03/20 0056  NA 141 140  K 3.5 3.7  CL 106 107  CO2 26 24  GLUCOSE  104* 85  BUN 16 18  CREATININE 2.07* 2.13*  CALCIUM 8.8* 8.5*   LFT Recent Labs    12/02/20 0911  PROT 6.4*  ALBUMIN 3.1*  AST 14*  ALT 6  ALKPHOS 108  BILITOT 1.0   PT/INR Recent Labs    12/02/20 0911 12/03/20 0056  LABPROT 30.4* 28.1*  INR 2.9* 2.6*   Studies/Results: DG CHEST PORT 1 VIEW  Result Date: 12/02/2020 CLINICAL DATA:  Chronic atrial fibrillation. Reported history of rectal bleeding. EXAM: PORTABLE CHEST 1 VIEW COMPARISON:  Chest radiograph dated 07/10/2016. FINDINGS: The heart size is enlarged. Vascular calcifications are seen in the aortic arch. The left costophrenic angle is obscured which may reflect pericardial fat versus a pleural effusion. The right lung is clear and there is no right pleural effusion. There is no pneumothorax. The visualized skeletal structures are unremarkable. IMPRESSION: Cardiomegaly. Obscured left costophrenic angle may represent pericardial fat versus a pleural effusion. Aortic Atherosclerosis (ICD10-I70.0). Electronically Signed   By: Zerita Boers M.D.   On: 12/02/2020 15:11    IMPRESSION:  *GIB bleed:  ? Upper vs lower.  Described as "chocolate red".  No bleeding since Sunday morning before his admission, none here. *ABLA:  Hgb stable at 9.1 grams this AM.  Overall this is down from 13.1 grams just five months ago. *PAF on coumadin at home:  INR was 2.9 yesterday and still 2.6 today.  No reversal agent given. *CKD starge 3b *Vitamin B12 deficiency:  Quite low at 51.  Needs supplements via injection (has been ordered).  PLAN: -He likely needs and EGD and colonoscopy to help sort this out with his ongoing need for anticoagulation.  He would prefer to do procedures as inpatient so that he has help as he does not get around great at home.  Being that INR still 2.6 today, unsure that it would be low enough for procedures tomorrow, 9/6.  Will give him clear liquids and then Dr. Adriana Mccallum will assume his care on 9/6 and can place orders/plan  for EGD and colonoscopy hopefully 9/7 but will defer to them ultimately. -Otherwise monitor Hgb and transfuse if needed. -Pantoprazole 40 mg IV BID as is ordered.  Laban Emperor. Zehr  12/03/2020, 8:49 AM   ________________________________________________________________________  [facility] MD note:  I personally examined the patient, reviewed the data and agree with the assessment and plan described above. He has been having GI bleeding, seems more likely lower but hard to say given the character of the blood output.  Coumadin certainly plays a role in the volume of blood loss and it has been held.  INR still elevated, 2.6 this morning. He probably will need EGD and colonoscopy this admission, final decision by his usual GI team Drs. Collene Mares and [place] who will assume his care in the morning.  Continue IV PPI BID, observe clinically, transfuse PRN   Owens Loffler, MD [facility] Pager [phone]

## 2020-12-03 NOTE — Progress Notes (Signed)
Physical Therapy Evaluation Patient Details Name: DEMTRIUS ROUNDS MRN: [id number] DOB: [birth year] Today's Date: 12/03/2020   History of Present Illness  Pt is a 75yo male presenting to [facility] ED on 9/4 reporting dark stools, likely secondary to GI bleed. Chest xray showed cardiomegaly.  PMH: HTN, PAF, gout, CHF, LE edema.  Clinical Impression  Pt presents with the impairments above and problems listed below. Required min assist for bed mobility and transfers, min guard for ambulation at EOB with RW. Further mobility limited secondary to fatigue. Pt lives with sister who is also currently in the hospital so will be unable to assist pt. Pt reports increased difficulty getting into and out of the house. Recommending SNF-level therapies to address current limitations. We will continue to follow the pt acutely to promote independence with functional mobility.    Follow Up Recommendations SNF;Supervision for mobility/OOB    Equipment Recommendations  3in1 (PT);Rolling walker with 5" wheels (Pt reports he shares a RW with his sister. Also 3:1 should be bariatric.)    Recommendations for Other Services       Precautions / Restrictions Precautions Precautions: Fall Precaution Comments: Pt reports a fall ~1 month ago where he fell into a RW on his left side and still reports some L knee swelling. Restrictions Weight Bearing Restrictions: No      Mobility  Bed Mobility Overal bed mobility: Needs Assistance Bed Mobility: Sit to Supine;Supine to Sit     Supine to sit: Min assist Sit to supine: Min assist   General bed mobility comments: Pt required min assist for trunk control to raise to sitting. Min assist to return to supine to bring LEs back on to bed.    Transfers Overall transfer level: Needs assistance Equipment used: Rolling walker (2 wheeled) Transfers: Sit to/from Stand Sit to Stand: Min assist         General transfer comment: Pt required min assist for lift assist; moderate  verbal cuing for hand placement and upright posturing. Upon standing, pt reported some "cracking" in his bilateral knees, L>R.  Ambulation/Gait Ambulation/Gait assistance: Min guard Gait Distance (Feet): 2 Feet Assistive device: Rolling walker (2 wheeled) Gait Pattern/deviations: Step-through pattern;Decreased step length - right;Decreased step length - left;Decreased dorsiflexion - right;Decreased dorsiflexion - left     General Gait Details: Pt ambulated forward and backward EOB with heavy reliance on BUE support on RW. BLE were in external rotation and pt demonstrated decreased DF and step length bilaterally. No overt LOB noted. Further mobility limited secondary to fatigue  Stairs            Wheelchair Mobility    Modified Rankin (Stroke Patients Only)       Balance Overall balance assessment: Needs assistance Sitting-balance support: No upper extremity supported;Feet supported Sitting balance-Leahy Scale: Good     Standing balance support: Bilateral upper extremity supported;During functional activity Standing balance-Leahy Scale: Poor Standing balance comment: Pt reliant on BUE on RW during static stance                             Pertinent Vitals/Pain Pain Assessment: No/denies pain    Home Living Family/patient expects to be discharged to:: Private residence Living Arrangements: Other relatives (Sister) Available Help at Discharge:  (Sister in the hospital right now and will be unable to assist) Type of Home: House Home Access: Stairs to enter Entrance Stairs-Rails: Psychiatric nurse of Steps: 3 Home Layout: One level Home Equipment:  Toilet riser;Cane - single point;Walker - 2 wheels Additional Comments: Pt mentions clutter in home. Reports that he would like to install a ramp.    Prior Function Level of Independence: Independent with assistive device(s)         Comments: Uses cane for gait inside of home. Uses RW to  transition from community ambulation. Cant get into the tub, sponge bathes at the sink     Hand Dominance   Dominant Hand: Right    Extremity/Trunk Assessment   Upper Extremity Assessment Upper Extremity Assessment: Generalized weakness    Lower Extremity Assessment Lower Extremity Assessment: Generalized weakness    Cervical / Trunk Assessment Cervical / Trunk Assessment: Kyphotic  Communication   Communication: No difficulties  Cognition Arousal/Alertness: Awake/alert Behavior During Therapy: WFL for tasks assessed/performed Overall Cognitive Status: No family/caregiver present to determine baseline cognitive functioning                                 General Comments: Very repeptitive during session and easily distracted.      General Comments General comments (skin integrity, edema, etc.): Pt mentioned that he takes 4 tylenols a day for his joint pain and stiffness.    Exercises General Exercises - Lower Extremity Hip Flexion/Marching: AROM;Both;5 reps;Standing (To encourage weightshifting)   Assessment/Plan    PT Assessment Patient needs continued PT services  PT Problem List Decreased strength;Decreased range of motion;Decreased activity tolerance;Decreased balance;Decreased mobility;Decreased safety awareness;Decreased cognition       PT Treatment Interventions DME instruction;Gait training;Stair training;Functional mobility training;Therapeutic activities;Therapeutic exercise;Balance training;Patient/family education    PT Goals (Current goals can be found in the Care Plan section)  Acute Rehab PT Goals Patient Stated Goal: to go home PT Goal Formulation: With patient Time For Goal Achievement: 12/17/20 Potential to Achieve Goals: Good    Frequency Min 2X/week   Barriers to discharge Decreased caregiver support;Inaccessible home environment Pt's sister is in hospital and will be unable to assist    Co-evaluation                AM-PAC PT "6 Clicks" Mobility  Outcome Measure Help needed turning from your back to your side while in a flat bed without using bedrails?: A Little Help needed moving from lying on your back to sitting on the side of a flat bed without using bedrails?: A Little Help needed moving to and from a bed to a chair (including a wheelchair)?: A Lot Help needed standing up from a chair using your arms (e.g., wheelchair or bedside chair)?: A Little Help needed to walk in hospital room?: A Lot Help needed climbing 3-5 steps with a railing? : A Lot 6 Click Score: 15    End of Session Equipment Utilized During Treatment: Gait belt Activity Tolerance: Patient limited by fatigue Patient left: in bed;with call bell/phone within reach;with bed alarm set Nurse Communication: Mobility status PT Visit Diagnosis: History of falling (Z91.81);Muscle weakness (generalized) (M62.81);Other abnormalities of gait and mobility (R26.89)    Time: 3559-7416 PT Time Calculation (min) (ACUTE ONLY): 26 min   Charges:   PT Evaluation $PT Eval Low Complexity: 1 Low PT Treatments $Therapeutic Activity: 8-22 mins       Dawayne Cirri, SPT Dawayne Cirri 12/03/2020, 2:00 PM

## 2020-12-03 NOTE — Progress Notes (Signed)
PROGRESS NOTE                                                                                                                                                                                                             Patient Demographics:    Bradley Stokes, is a 75 y.o. male, DOB - 03-17-1946, KTG:[id number]  Outpatient Primary MD for the patient is [name], [name], MD    LOS - 0  Admit date - 12/02/2020    Chief Complaint  Patient presents with   Rectal Bleeding       Brief Narrative (HPI from H&P)   Bradley Stokes is a 75 y.o. male with medical history significant of hypertension, hyperlipidemia, paroxysmal atrial fibrillation on Coumadin, osteoarthritis, and obesity who presents with complaints of bleeding rectally, he was noted to have INR of 2.9, H&H was stable he was admitted to the hospital for further blood per rectum work-up.   Subjective:    Bradley Stokes today has, No headache, No chest pain, No abdominal pain - No Nausea, No new weakness tingling or numbness, no SOB.   Assessment  & Plan :     Acute patient with history of diverticulosis in the past, causing acute lower GI blood loss related anaemia - INR gradually trending down, bleeding has slowed down, on clears, monitor H&H, PPI, GI on board, Coumadin held - monitor closely.  2.  Paroxysmal A. fib with CHA2DS2-VASc 2 score of greater than 3.  Coumadin held due to 1 above.  Continue amiodarone for rate control.  3.  Morbid obesity.  BMI of 41.  Follow with PCP for weight loss.  4.  Chronic diastolic CHF last known EF is 55% in 2016.  Has a chronic leg edema continue Lasix as tolerated.  Add TED stockings.  5. Dyslipidemia - statin.  6. CKD 3B -Baseline creatinine close to 2.  Monitor.          Condition -  Guarded  Family Communication  :  None present  Code Status :  Full  Consults  :  GI  PUD Prophylaxis : PPI   Procedures  :             Disposition Plan  :    Status is: Inpatient  Remains inpatient appropriate because:IV treatments appropriate due to intensity of illness  or inability to take PO  Dispo: The patient is from: Home              Anticipated d/c is to: Home              Patient currently is not medically stable to d/c.   Difficult to place patient No  DVT Prophylaxis  :    SCDs Start: 12/02/20 1125    Lab Results  Component Value Date   PLT 307 12/03/2020    Diet :  Diet Order             Diet clear liquid Room service appropriate? Yes; Fluid consistency: Thin  Diet effective now                    Inpatient Medications  Scheduled Meds:  allopurinol  100 mg Oral Daily   amiodarone  100 mg Oral Daily   ammonium lactate  1 application Topical BID   atorvastatin  20 mg Oral Daily   calcitRIOL  0.5 mcg Oral Daily   furosemide  40 mg Oral BID   pantoprazole (PROTONIX) IV  40 mg Intravenous Q12H   phosphorus  250 mg Oral BID   potassium chloride SA  20 mEq Oral Daily   sodium chloride flush  3 mL Intravenous Q12H   Continuous Infusions: PRN Meds:.acetaminophen **OR** acetaminophen, albuterol  Antibiotics  :    Anti-infectives (From admission, onward)    None        Time Spent in minutes  30   Lala Lund M.D on 12/03/2020 at 11:30 AM  To page go to [email or web address]   [facility] -  Office  [phone]    See all Orders from today for further details    Objective:   Vitals:   12/02/20 2351 12/03/20 0341 12/03/20 0600 12/03/20 0810  BP: (!) 151/69 (!) 141/68  (!) 148/63  Pulse:    76  Resp: 20   17  Temp: 98.4 F (36.9 C) 98.5 F (36.9 C)  98.5 F (36.9 C)  TempSrc: Oral Oral  Oral  SpO2:    97%  Weight:   127.4 kg     Wt Readings from Last 3 Encounters:  12/03/20 127.4 kg  06/12/20 130.2 kg  12/21/19 135.5 kg     Intake/Output Summary (Last 24 hours) at 12/03/2020 1130 Last data filed at 12/03/2020 1050 Gross per 24 hour  Intake  --  Output 2000 ml  Net -2000 ml     Physical Exam  Awake Alert, No new F.N deficits, Normal affect [place].AT,PERRAL Supple Neck,No JVD, No cervical lymphadenopathy appriciated.  Symmetrical Chest wall movement, Good air movement bilaterally, CTAB RRR,No Gallops,Rubs or new Murmurs, No Parasternal Heave +ve B.Sounds, Abd Soft, No tenderness, No organomegaly appriciated, No rebound - guarding or rigidity. No Cyanosis, 2+ leg edema with chronic discoloration due to venous stasis.   Data Review:    CBC Recent Labs  Lab 12/02/20 0911 12/02/20 1212 12/02/20 1428 12/03/20 0056 12/03/20 0750  WBC 7.0  --   --  8.7 8.0  HGB 9.7* 9.9* 9.4* 8.7* 9.1*  HCT 31.7* 30.9* 30.3* 27.1* 29.1*  PLT 340  --   --  287 307  MCV 101.6*  --   --  98.9 100.7*  MCH 31.1  --   --  31.8 31.5  MCHC 30.6  --   --  32.1 31.3  RDW 13.8  --   --  13.8 13.9  LYMPHSABS  1.3  --   --   --   --   MONOABS 0.5  --   --   --   --   EOSABS 0.1  --   --   --   --   BASOSABS 0.0  --   --   --   --     Recent Labs  Lab 12/02/20 0911 12/03/20 0056  NA 141 140  K 3.5 3.7  CL 106 107  CO2 26 24  GLUCOSE 104* 85  BUN 16 18  CREATININE 2.07* 2.13*  CALCIUM 8.8* 8.5*  AST 14*  --   ALT 6  --   ALKPHOS 108  --   BILITOT 1.0  --   ALBUMIN 3.1*  --   INR 2.9* 2.6*    ------------------------------------------------------------------------------------------------------------------ No results for input(s): CHOL, HDL, LDLCALC, TRIG, CHOLHDL, LDLDIRECT in the last 72 hours.  No results found for: HGBA1C ------------------------------------------------------------------------------------------------------------------ No results for input(s): TSH, T4TOTAL, T3FREE, THYROIDAB in the last 72 hours.  Invalid input(s): FREET3  Cardiac Enzymes No results for input(s): CKMB, TROPONINI, MYOGLOBIN in the last 168 hours.  Invalid input(s):  CK ------------------------------------------------------------------------------------------------------------------ No results found for: BNP   Radiology Reports DG CHEST PORT 1 VIEW  Result Date: 12/02/2020 CLINICAL DATA:  Chronic atrial fibrillation. Reported history of rectal bleeding. EXAM: PORTABLE CHEST 1 VIEW COMPARISON:  Chest radiograph dated 07/10/2016. FINDINGS: The heart size is enlarged. Vascular calcifications are seen in the aortic arch. The left costophrenic angle is obscured which may reflect pericardial fat versus a pleural effusion. The right lung is clear and there is no right pleural effusion. There is no pneumothorax. The visualized skeletal structures are unremarkable. IMPRESSION: Cardiomegaly. Obscured left costophrenic angle may represent pericardial fat versus a pleural effusion. Aortic Atherosclerosis (ICD10-I70.0). Electronically Signed   By: Zerita Boers M.D.   On: 12/02/2020 15:11

## 2020-12-03 NOTE — Plan of Care (Signed)

## 2020-12-03 NOTE — NC FL2 (Signed)
[place] LEVEL OF CARE SCREENING TOOL     IDENTIFICATION  Patient Name: Bradley Stokes Birthdate: Jan 11, 1946 Sex: male Admission Date (Current Location): 12/02/2020  County and [place] Number:  Herbalist and Address:  [facility] [place], [place] [address], [place], [place] [address]      Provider Number: [id number]  Attending Physician Name and Address:  Thurnell Lose, MD  Relative Name and Phone Number:       Current Level of Care: Hospital Recommended Level of Care: [place] Prior Approval Number:    Date Approved/Denied:   PASRR Number: [id number]  Discharge Plan: SNF    Current Diagnoses: Patient Active Problem List   Diagnosis Date Noted   GI bleed 12/02/2020   (HFpEF) heart failure with preserved ejection fraction ([place]) 12/02/2020   Acute blood loss anemia 12/02/2020   Heart murmur previously undiagnosed 01/01/2015   Long term use of drug 09/05/2011   PAF (paroxysmal atrial fibrillation) ([place])    Edema    Long term current use of anticoagulant therapy    Hyperlipidemia    Hypertension     Orientation RESPIRATION BLADDER Height & Weight     Self, Time, Situation, Place  Normal Continent Weight: 280 lb 13.9 oz (127.4 kg) Height:     BEHAVIORAL SYMPTOMS/MOOD NEUROLOGICAL BOWEL NUTRITION STATUS      Continent Diet (Please see DC Summary)  AMBULATORY STATUS COMMUNICATION OF NEEDS Skin   Extensive Assist Verbally Normal                       Personal Care Assistance Level of Assistance  Bathing, Feeding, Dressing Bathing Assistance: Limited assistance Feeding assistance: Limited assistance Dressing Assistance: Limited assistance     Functional Limitations Info  Sight, Hearing, Speech Sight Info: Impaired Hearing Info: Adequate Speech Info: Adequate    SPECIAL CARE FACTORS FREQUENCY  PT (By licensed PT), OT (By licensed OT)     PT Frequency: 5x/Week OT Frequency: 5x/Week             Contractures Contractures Info: Not present    Additional Factors Info  Code Status, Allergies Code Status Info: Full Allergies Info: Other (unknown)           Current Medications (12/03/2020):  This is the current hospital active medication list Current Facility-Administered Medications  Medication Dose Route Frequency Provider Last Rate Last Admin   acetaminophen (TYLENOL) tablet 650 mg  650 mg Oral Q6H PRN Norval Morton, MD       Or   acetaminophen (TYLENOL) suppository 650 mg  650 mg Rectal Q6H PRN Fuller Plan A, MD       albuterol (PROVENTIL) (2.5 MG/3ML) 0.083% nebulizer solution 2.5 mg  2.5 mg Nebulization Q6H PRN Tamala Julian, Rondell A, MD       allopurinol (ZYLOPRIM) tablet 100 mg  100 mg Oral Daily Tamala Julian, Rondell A, MD   100 mg at 12/03/20 0938   amiodarone (PACERONE) tablet 100 mg  100 mg Oral Daily Smith, Rondell A, MD   100 mg at 12/03/20 0907   ammonium lactate (LAC-HYDRIN) 12 % lotion 1 application  1 application Topical BID Fuller Plan A, MD   1 application at 18/29/93 0911   atorvastatin (LIPITOR) tablet 20 mg  20 mg Oral Daily Fuller Plan A, MD   20 mg at 12/03/20 7169   calcitRIOL (ROCALTROL) capsule 0.5 mcg  0.5 mcg Oral Daily Norval Morton, MD  0.5 mcg at 12/03/20 0907   furosemide (LASIX) tablet 40 mg  40 mg Oral BID Fuller Plan A, MD   40 mg at 12/03/20 0907   pantoprazole (PROTONIX) injection 40 mg  40 mg Intravenous Q12H Smith, Rondell A, MD   40 mg at 12/03/20 0906   phosphorus (K PHOS NEUTRAL) tablet 250 mg  250 mg Oral BID Fuller Plan A, MD   250 mg at 12/03/20 0907   potassium chloride SA (KLOR-CON) CR tablet 20 mEq  20 mEq Oral Daily Smith, Rondell A, MD   20 mEq at 12/03/20 0906   sodium chloride flush (NS) 0.9 % injection 3 mL  3 mL Intravenous Q12H Fuller Plan A, MD   3 mL at 12/03/20 0913     Discharge Medications: Please see discharge summary for a list of discharge medications.  Relevant Imaging Results:  Relevant Lab  Results:   Additional Information SSN#: [id number] [place] COVID-19 Vaccine  06/11/19, 07/02/2019, 02/21/2020, 08/16/2020  Cesiah Westley, LCSWA

## 2020-12-03 NOTE — Evaluation (Signed)
Occupational Therapy Evaluation Patient Details Name: Bradley Stokes MRN: [id number] DOB: December 20, 1945 Today's Date: 12/03/2020    History of Present Illness Pt is a 75yo male presenting to [facility] ED on 9/4 reporting dark stools, likely secondary to GI bleed. Chest xray showed cardiomegaly.  PMH: HTN, PAF, gout, CHF, LE edema.   Clinical Impression   This 75 yo male admitted with above presents to acute OT at a min guard A level for all basic ADLs at RW level with decreased endurance for activity. He is normally Mod I with basic ADLs and cooks for him and his sister. Currently his sister is in the hospital as well so she is unable to A or evenf provide S for him. He will benefit from acute OT with followup at SNF recommended.    Follow Up Recommendations  SNF;Supervision/Assistance - 24 hour    Equipment Recommendations  Other (comment) (bariatric 3n1)       Precautions / Restrictions Precautions Precautions: Fall Precaution Comments: Pt reports a fall ~1 month ago where he fell into a RW on his left side and still reports some L knee swelling. Restrictions Weight Bearing Restrictions: No      Mobility Bed Mobility Overal bed mobility: Needs Assistance Bed Mobility: Sit to Supine;Supine to Sit     Supine to sit: Supervision Sit to supine: Supervision   General bed mobility comments: increased time and effort, HOB up    Transfers Overall transfer level: Needs assistance Equipment used: Rolling walker (2 wheeled) Transfers: Sit to/from Stand Sit to Stand: Min guard         General transfer comment: uses rocking motion for momentum for sit>stand, VCs for safe hand placement    Balance Overall balance assessment: Needs assistance Sitting-balance support: No upper extremity supported;Feet supported Sitting balance-Leahy Scale: Good     Standing balance support: Bilateral upper extremity supported;During functional activity Standing balance-Leahy Scale: Poor Standing  balance comment: Pt reliant on BUE on RW during static stance                           ADL either performed or assessed with clinical judgement   ADL Overall ADL's : Needs assistance/impaired Eating/Feeding: Independent;Sitting   Grooming: Set up;Sitting   Upper Body Bathing: Set up;Sitting   Lower Body Bathing: Min guard;Sit to/from stand   Upper Body Dressing : Set up;Sitting   Lower Body Dressing: Min guard;Sit to/from stand   Toilet Transfer: Min guard;Ambulation;RW Toilet Transfer Details (indicate cue type and reason): simulated bed>door>bed Toileting- Clothing Manipulation and Hygiene: Min guard;Sit to/from stand               Vision Baseline Vision/History: 1 Wears glasses Ability to See in Adequate Light: 0 Adequate              Pertinent Vitals/Pain Pain Assessment: No/denies pain     Hand Dominance Right   Extremity/Trunk Assessment Upper Extremity Assessment Upper Extremity Assessment: Generalized weakness      Communication Communication Communication: No difficulties   Cognition Arousal/Alertness: Awake/alert Behavior During Therapy: WFL for tasks assessed/performed Overall Cognitive Status: No family/caregiver present to determine baseline cognitive functioning                                 General Comments: Very talkative              Home Living Family/patient expects  to be discharged to:: Private residence Living Arrangements: Other relatives (sister) Available Help at Discharge:  (sister in [place] right now and cannot help him) Type of Home: House Home Access: Stairs to enter Technical brewer of Steps: 3 Entrance Stairs-Rails: Right;Left Home Layout: One level     Bathroom Shower/Tub: Tub/shower unit (does sponge baths)   Bathroom Toilet: Standard Bathroom Accessibility: Yes   Home Equipment: Toilet riser;Cane - single point;Walker - 2 wheels   Additional Comments: Pt mentions clutter  in home. Reports that he would like to install a ramp.      Prior Functioning/Environment Level of Independence: Independent with assistive device(s)        Comments: Uses cane for gait inside of home. Uses RW to transition from community ambulation. Cant get into the tub, sponge bathes at the sink        OT Problem List: Impaired balance (sitting and/or standing);Obesity      OT Treatment/Interventions: Self-care/ADL training;DME and/or AE instruction;Patient/family education;Balance training    OT Goals(Current goals can be found in the care plan section) Acute Rehab OT Goals Patient Stated Goal: to go home OT Goal Formulation: With patient Time For Goal Achievement: 12/17/20 Potential to Achieve Goals: Good  OT Frequency: Min 2X/week   Barriers to D/C: Decreased caregiver support             AM-PAC OT "6 Clicks" Daily Activity     Outcome Measure Help from another person eating meals?: None Help from another person taking care of personal grooming?: A Little Help from another person toileting, which includes using toliet, bedpan, or urinal?: A Little Help from another person bathing (including washing, rinsing, drying)?: A Little Help from another person to put on and taking off regular upper body clothing?: A Little Help from another person to put on and taking off regular lower body clothing?: A Little 6 Click Score: 19   End of Session Equipment Utilized During Treatment: Rolling walker  Activity Tolerance: Patient tolerated treatment well Patient left: in bed;with call bell/phone within reach;with bed alarm set  OT Visit Diagnosis: Unsteadiness on feet (R26.81);Other abnormalities of gait and mobility (R26.89);Muscle weakness (generalized) (M62.81)                Time: 9675-9163 OT Time Calculation (min): 28 min Charges:  OT General Charges $OT Visit: 1 Visit OT Evaluation $OT Eval Moderate Complexity: 1 Mod OT Treatments $Self Care/Home Management : 8-22  mins  [place], OTR/L Acute [employer] Pager [phone] Office [phone]    Almon Register 12/03/2020, 3:11 PM

## 2020-12-04 LAB — CBC WITH DIFFERENTIAL/PLATELET
Abs Immature Granulocytes: 0.03 10*3/uL (ref 0.00–0.07)
Basophils Absolute: 0.1 10*3/uL (ref 0.0–0.1)
Basophils Relative: 1 %
Eosinophils Absolute: 0.3 10*3/uL (ref 0.0–0.5)
Eosinophils Relative: 4 %
HCT: 27.9 % — ABNORMAL LOW (ref 39.0–52.0)
Hemoglobin: 8.8 g/dL — ABNORMAL LOW (ref 13.0–17.0)
Immature Granulocytes: 0 %
Lymphocytes Relative: 21 %
Lymphs Abs: 1.9 10*3/uL (ref 0.7–4.0)
MCH: 31.3 pg (ref 26.0–34.0)
MCHC: 31.5 g/dL (ref 30.0–36.0)
MCV: 99.3 fL (ref 80.0–100.0)
Monocytes Absolute: 1 10*3/uL (ref 0.1–1.0)
Monocytes Relative: 12 %
Neutro Abs: 5.5 10*3/uL (ref 1.7–7.7)
Neutrophils Relative %: 62 %
Platelets: 296 10*3/uL (ref 150–400)
RBC: 2.81 MIL/uL — ABNORMAL LOW (ref 4.22–5.81)
RDW: 13.8 % (ref 11.5–15.5)
WBC: 8.8 10*3/uL (ref 4.0–10.5)
nRBC: 0 % (ref 0.0–0.2)

## 2020-12-04 LAB — COMPREHENSIVE METABOLIC PANEL
ALT: 6 U/L (ref 0–44)
AST: 15 U/L (ref 15–41)
Albumin: 3 g/dL — ABNORMAL LOW (ref 3.5–5.0)
Alkaline Phosphatase: 99 U/L (ref 38–126)
Anion gap: 10 (ref 5–15)
BUN: 20 mg/dL (ref 8–23)
CO2: 26 mmol/L (ref 22–32)
Calcium: 8.5 mg/dL — ABNORMAL LOW (ref 8.9–10.3)
Chloride: 104 mmol/L (ref 98–111)
Creatinine, Ser: 2.38 mg/dL — ABNORMAL HIGH (ref 0.61–1.24)
GFR, Estimated: 27 mL/min — ABNORMAL LOW (ref >60)
Glucose, Bld: 86 mg/dL (ref 70–99)
Potassium: 3.5 mmol/L (ref 3.5–5.1)
Sodium: 140 mmol/L (ref 135–145)
Total Bilirubin: 1.5 mg/dL — ABNORMAL HIGH (ref 0.3–1.2)
Total Protein: 6 g/dL — ABNORMAL LOW (ref 6.5–8.1)

## 2020-12-04 LAB — BRAIN NATRIURETIC PEPTIDE: B Natriuretic Peptide: 23.2 pg/mL (ref 0.0–100.0)

## 2020-12-04 LAB — PROTIME-INR
INR: 2.3 — ABNORMAL HIGH (ref 0.8–1.2)
Prothrombin Time: 25.5 seconds — ABNORMAL HIGH (ref 11.4–15.2)

## 2020-12-04 LAB — MAGNESIUM: Magnesium: 2.1 mg/dL (ref 1.7–2.4)

## 2020-12-04 MED ORDER — LACTATED RINGERS IV SOLN: INTRAVENOUS | Status: AC

## 2020-12-04 MED ORDER — VITAMIN K1 10 MG/ML IJ SOLN
0.5000 mg | Freq: Once | INTRAVENOUS | Status: AC
  Administered 2020-12-04: 0.5 mg via INTRAVENOUS
  Filled 2020-12-04: qty 0.05

## 2020-12-04 MED ORDER — PEG 3350-KCL-NA BICARB-NACL 420 G PO SOLR
4000.0000 mL | Freq: Once | ORAL | Status: AC
  Administered 2020-12-04: 4000 mL via ORAL
  Filled 2020-12-04: qty 4000

## 2020-12-04 NOTE — Progress Notes (Addendum)
PROGRESS NOTE                                                                                                                                                                                                             Patient Demographics:    Bradley Stokes, is a 75 y.o. male, DOB - [birth year], ZTI:[id number]  Outpatient Primary MD for the patient is [name], [name], MD    LOS - 1  Admit date - 12/02/2020    Chief Complaint  Patient presents with   Rectal Bleeding       Brief Narrative (HPI from H&P)   Bradley Stokes is a 75 y.o. male with medical history significant of hypertension, hyperlipidemia, paroxysmal atrial fibrillation on Coumadin, osteoarthritis, and obesity who presents with complaints of bleeding rectally, he was noted to have INR of 2.9, H&H was stable he was admitted to the hospital for further blood per rectum work-up.   Subjective:   Patient in bed, appears comfortable, denies any headache, no fever, no chest pain or pressure, no shortness of breath , no abdominal pain. No new focal weakness.    Assessment  & Plan :     Acute patient with history of diverticulosis in the past, causing acute lower GI blood loss related anaemia - INR gradually trending down, bleeding has slowed down, on clears, monitor H&H, PPI, GI on board, Coumadin held, Vit K 0.5 x 1 on 12/04/20 - likely endoscopic eval on 9/7, monitor H&H. Likely diverticular bleed.  2.  Paroxysmal A. fib with CHA2DS2-VASc 2 score of greater than 3.  Coumadin held due to 1 above.  Continue amiodarone for rate control.  3.  Morbid obesity.  BMI of 41.  Follow with PCP for weight loss.  4.  Chronic diastolic CHF last known EF is 55% in 2016.  Has a chronic leg edema continue Lasix held for bowel prep and mild AKI. Added TED stockings.  5. Dyslipidemia - statin.  6. AKI on CKD 3B - Baseline creatinine close to 2, hold lasix, gentle IVF on 12/04/20.          Condition -  Guarded  Family Communication  :  None present  Code Status :  Full  Consults  :  GI  PUD Prophylaxis : PPI   Procedures  :  Disposition Plan  :    Status is: Inpatient  Remains inpatient appropriate because:IV treatments appropriate due to intensity of illness or inability to take PO  Dispo: The patient is from: Home              Anticipated d/c is to: Home              Patient currently is not medically stable to d/c.   Difficult to place patient No  DVT Prophylaxis  :    Place TED hose Start: 12/03/20 1131 SCDs Start: 12/02/20 1125    Lab Results  Component Value Date   PLT 296 12/04/2020    Diet :  Diet Order             Diet clear liquid Room service appropriate? Yes; Fluid consistency: Thin  Diet effective now                    Inpatient Medications  Scheduled Meds:  allopurinol  100 mg Oral Daily   amiodarone  100 mg Oral Daily   ammonium lactate  1 application Topical BID   atorvastatin  20 mg Oral Daily   calcitRIOL  0.5 mcg Oral Daily   pantoprazole (PROTONIX) IV  40 mg Intravenous Q12H   phosphorus  250 mg Oral BID   potassium chloride SA  20 mEq Oral Daily   sodium chloride flush  3 mL Intravenous Q12H   Continuous Infusions:  lactated ringers 100 mL/hr at 12/04/20 0953   PRN Meds:.acetaminophen **OR** acetaminophen, albuterol  Antibiotics  :    Anti-infectives (From admission, onward)    None        Time Spent in minutes  30   Lala Lund M.D on 12/04/2020 at 10:40 AM  To page go to [email or web address]   [facility] -  Office  [phone]    See all Orders from today for further details    Objective:   Vitals:   12/03/20 2037 12/03/20 2351 12/04/20 0359 12/04/20 0740  BP: (!) 150/58 (!) 151/61 (!) 172/66 (!) 159/63  Pulse: 82 79 76 73  Resp: 17 15 17 15   Temp: 98.2 F (36.8 C) 98.5 F (36.9 C) 98.4 F (36.9 C) (!) 97.3 F (36.3 C)  TempSrc: Oral Oral Oral Oral   SpO2: 97% 96% 98% 98%  Weight:        Wt Readings from Last 3 Encounters:  12/03/20 127.4 kg  06/12/20 130.2 kg  12/21/19 135.5 kg     Intake/Output Summary (Last 24 hours) at 12/04/2020 1040 Last data filed at 12/04/2020 0047 Gross per 24 hour  Intake --  Output 1150 ml  Net -1150 ml     Physical Exam  Awake Alert, No new F.N deficits, Normal affect [place].AT,PERRAL Supple Neck,No JVD, No cervical lymphadenopathy appriciated.  Symmetrical Chest wall movement, Good air movement bilaterally, CTAB RRR,No Gallops, Rubs or new Murmurs, No Parasternal Heave +ve B.Sounds, Abd Soft, No tenderness, No organomegaly appriciated, No rebound - guarding or rigidity. 2+ leg edema with chronic discoloration due to venous stasis.   Data Review:    CBC Recent Labs  Lab 12/02/20 0911 12/02/20 1212 12/02/20 1428 12/03/20 0056 12/03/20 0750 12/03/20 1419 12/04/20 0202  WBC 7.0  --   --  8.7 8.0 9.9 8.8  HGB 9.7*   < > 9.4* 8.7* 9.1* 9.7* 8.8*  HCT 31.7*   < > 30.3* 27.1* 29.1* 30.3* 27.9*  PLT 340  --   --  287 307 357 296  MCV 101.6*  --   --  98.9 100.7* 98.7 99.3  MCH 31.1  --   --  31.8 31.5 31.6 31.3  MCHC 30.6  --   --  32.1 31.3 32.0 31.5  RDW 13.8  --   --  13.8 13.9 13.7 13.8  LYMPHSABS 1.3  --   --   --   --   --  1.9  MONOABS 0.5  --   --   --   --   --  1.0  EOSABS 0.1  --   --   --   --   --  0.3  BASOSABS 0.0  --   --   --   --   --  0.1   < > = values in this interval not displayed.    Recent Labs  Lab 12/02/20 0911 12/03/20 0056 12/04/20 0202  NA 141 140 140  K 3.5 3.7 3.5  CL 106 107 104  CO2 26 24 26   GLUCOSE 104* 85 86  BUN 16 18 20   CREATININE 2.07* 2.13* 2.38*  CALCIUM 8.8* 8.5* 8.5*  AST 14*  --  15  ALT 6  --  6  ALKPHOS 108  --  99  BILITOT 1.0  --  1.5*  ALBUMIN 3.1*  --  3.0*  MG  --   --  2.1  INR 2.9* 2.6* 2.3*  BNP  --   --  23.2     ------------------------------------------------------------------------------------------------------------------ No results for input(s): CHOL, HDL, LDLCALC, TRIG, CHOLHDL, LDLDIRECT in the last 72 hours.  No results found for: HGBA1C ------------------------------------------------------------------------------------------------------------------ No results for input(s): TSH, T4TOTAL, T3FREE, THYROIDAB in the last 72 hours.  Invalid input(s): FREET3  Cardiac Enzymes No results for input(s): CKMB, TROPONINI, MYOGLOBIN in the last 168 hours.  Invalid input(s): CK ------------------------------------------------------------------------------------------------------------------    Component Value Date/Time   BNP 23.2 12/04/2020 0202     Radiology Reports DG CHEST PORT 1 VIEW  Result Date: 12/02/2020 CLINICAL DATA:  Chronic atrial fibrillation. Reported history of rectal bleeding. EXAM: PORTABLE CHEST 1 VIEW COMPARISON:  Chest radiograph dated 07/10/2016. FINDINGS: The heart size is enlarged. Vascular calcifications are seen in the aortic arch. The left costophrenic angle is obscured which may reflect pericardial fat versus a pleural effusion. The right lung is clear and there is no right pleural effusion. There is no pneumothorax. The visualized skeletal structures are unremarkable. IMPRESSION: Cardiomegaly. Obscured left costophrenic angle may represent pericardial fat versus a pleural effusion. Aortic Atherosclerosis (ICD10-I70.0). Electronically Signed   By: Zerita Boers M.D.   On: 12/02/2020 15:11

## 2020-12-04 NOTE — TOC Initial Note (Signed)
Transition of Care [facility]) - Initial/Assessment Note    Patient Details  Name: Bradley Stokes MRN: [id number] Date of Birth: 1945-09-26  [facility] CM/SW Contact:    Benard Halsted, LCSW Phone Number: 12/04/2020, 4:48 PM  Clinical Narrative:                 CSW received SNF consult for patient. CSW spoke with patient at bedside to discuss discharge plan. He reported that he and his sister live together and take care of each other, though he provides the cooking and transportation. CSW discussed SNF recommendation with patient but he is stating that he cannot go to SNF without his sister since he cares for her. CSW explained that though his sister is currently admitted as well, she has not been recommended for SNF but that he has because he is not walking as well as she is. He stated he can walk if he has his cane. CSW expressed concern because patient is needing assistance to get up to the bedside commode and if he can't assist himself, he cannot assist his sister. Patient provided CSW permission to speak with his sister, Bradley Stokes. CSW will visit with her in the morning as she was currently asleep in her hospital room.     Barriers to Discharge: Ship broker, Continued Medical Work up   Patient Goals and CMS Choice Patient states their goals for this hospitalization and ongoing recovery are:: Take care of sister CMS Medicare.gov Compare Post Acute Care list provided to:: Patient Choice offered to / list presented to : Patient  Expected Discharge Plan and Services   In-house Referral: Clinical Social Work     Living arrangements for the past 2 months: Single Family Home                                      Prior Living Arrangements/Services Living arrangements for the past 2 months: Single Family Home Lives with:: Siblings Patient language and need for interpreter reviewed:: Yes Do you feel safe going back to the place where you live?: Yes      Need  for Family Participation in Patient Care: Yes (Comment) Care giver support system in place?: No (comment)   Criminal Activity/Legal Involvement Pertinent to Current Situation/Hospitalization: No - Comment as needed  Activities of Daily Living      Permission Sought/Granted Permission sought to share information with : Facility Sport and exercise psychologist, Family Supports Permission granted to share information with : Yes, Verbal Permission Granted  Share Information with NAME: Bradley Stokes  Permission granted to share info w AGENCY: SNFs  Permission granted to share info w Relationship: Sister  Permission granted to share info w Contact Information: [phone]/admitted on 63M  Emotional Assessment Appearance:: Appears stated age Attitude/Demeanor/Rapport: Engaged Affect (typically observed): Accepting, Appropriate Orientation: : Oriented to Self, Oriented to Place, Oriented to  Time, Oriented to Situation Alcohol / Substance Use: Not Applicable Psych Involvement: No (comment)  Admission diagnosis:  GI bleed [K92.2] Gastrointestinal hemorrhage, unspecified gastrointestinal hemorrhage type [K92.2] Anemia, unspecified type [D64.9] Patient Active Problem List   Diagnosis Date Noted   GI bleed 12/02/2020   (HFpEF) heart failure with preserved ejection fraction ([place]) 12/02/2020   Acute blood loss anemia 12/02/2020   Heart murmur previously undiagnosed 01/01/2015   Long term use of drug 09/05/2011   PAF (paroxysmal atrial fibrillation) ([place])    Edema    Long  term current use of anticoagulant therapy    Hyperlipidemia    Hypertension    PCP:  [name], MD Pharmacy:   [facility] [place], [address] [facility] [address] Phone: [phone] Fax: [phone]     Social Determinants of Health (SDOH) Interventions    Readmission Risk Interventions No flowsheet data found.

## 2020-12-04 NOTE — Progress Notes (Signed)
Subjective: No complaints.  Feeling well.  Objective: Vital signs in last 24 hours: Temp:  [97.3 F (36.3 C)-98.6 F (37 C)] 97.3 F (36.3 C) (09/06 0740) Pulse Rate:  [73-96] 73 (09/06 0740) Resp:  [15-20] 15 (09/06 0740) BP: (147-172)/(55-68) 159/63 (09/06 0740) SpO2:  [94 %-98 %] 98 % (09/06 0740) Last BM Date: 12/03/20  Intake/Output from previous day: 09/05 0701 - 09/06 0700 In: -  Out: 1150 [Urine:1150] Intake/Output this shift: Total I/O In: -  Out: 150 [Urine:150]  General appearance: alert and no distress GI: soft, non-tender; bowel sounds normal; no masses,  no organomegaly and obese  Lab Results: Recent Labs    12/03/20 0750 12/03/20 1419 12/04/20 0202  WBC 8.0 9.9 8.8  HGB 9.1* 9.7* 8.8*  HCT 29.1* 30.3* 27.9*  PLT 307 357 296   BMET Recent Labs    12/02/20 0911 12/03/20 0056 12/04/20 0202  NA 141 140 140  K 3.5 3.7 3.5  CL 106 107 104  CO2 26 24 26   GLUCOSE 104* 85 86  BUN 16 18 20   CREATININE 2.07* 2.13* 2.38*  CALCIUM 8.8* 8.5* 8.5*   LFT Recent Labs    12/04/20 0202  PROT 6.0*  ALBUMIN 3.0*  AST 15  ALT 6  ALKPHOS 99  BILITOT 1.5*   PT/INR Recent Labs    12/03/20 0056 12/04/20 0202  LABPROT 28.1* 25.5*  INR 2.6* 2.3*   Hepatitis Panel No results for input(s): HEPBSAG, HCVAB, HEPAIGM, HEPBIGM in the last 72 hours. C-Diff No results for input(s): CDIFFTOX in the last 72 hours. Fecal Lactopherrin No results for input(s): FECLLACTOFRN in the last 72 hours.  Studies/Results: DG CHEST PORT 1 VIEW  Result Date: 12/02/2020 CLINICAL DATA:  Chronic atrial fibrillation. Reported history of rectal bleeding. EXAM: PORTABLE CHEST 1 VIEW COMPARISON:  Chest radiograph dated 07/10/2016. FINDINGS: The heart size is enlarged. Vascular calcifications are seen in the aortic arch. The left costophrenic angle is obscured which may reflect pericardial fat versus a pleural effusion. The right lung is clear and there is no right pleural effusion.  There is no pneumothorax. The visualized skeletal structures are unremarkable. IMPRESSION: Cardiomegaly. Obscured left costophrenic angle may represent pericardial fat versus a pleural effusion. Aortic Atherosclerosis (ICD10-I70.0). Electronically Signed   By: Zerita Boers M.D.   On: 12/02/2020 15:11    Medications: Scheduled:  allopurinol  100 mg Oral Daily   amiodarone  100 mg Oral Daily   ammonium lactate  1 application Topical BID   atorvastatin  20 mg Oral Daily   calcitRIOL  0.5 mcg Oral Daily   pantoprazole (PROTONIX) IV  40 mg Intravenous Q12H   phosphorus  250 mg Oral BID   polyethylene glycol-electrolytes  4,000 mL Oral Once   potassium chloride SA  20 mEq Oral Daily   sodium chloride flush  3 mL Intravenous Q12H   Continuous:  lactated ringers 100 mL/hr at 12/04/20 0953   phytonadione (VITAMIN K) IV      Assessment/Plan: 1) Hematochezia. 2) Anemia. 3) Afib. 4) Diastolic CHF.   Bradley Stokes denies any further bleeding.  His INR is declining and it should be in an acceptable range for an EGD/colonoscopy tomorrow.  An attempted colonoscopy was performed by Dr. Collene Mares on 11/14/1999, but his colonoscopy was incomplete.  It was essentially a FFS as his prep was poor.  Bradley Stokes was noted to have left sided diverticula.  With the new onset bleeding and anemia an EGD/colonoscopy will be performed.  Bradley Stokes states that  Bradley Stokes will try his best to prep.  Plan: 1) EGD/colonoscopy tomorrow with Dr. Collene Mares.   LOS: 1 day   Meghanne Pletz D 12/04/2020, 12:04 PM

## 2020-12-05 ENCOUNTER — Encounter (HOSPITAL_COMMUNITY): Payer: Self-pay | Admitting: Internal Medicine

## 2020-12-05 ENCOUNTER — Encounter (HOSPITAL_COMMUNITY)
Admission: EM | Disposition: A | Discharge status: Skilled Nursing Facility | Payer: Self-pay | Source: Home / Self Care | Attending: Internal Medicine

## 2020-12-05 ENCOUNTER — Inpatient Hospital Stay (HOSPITAL_COMMUNITY): Payer: Medicare HMO | Admitting: Anesthesiology

## 2020-12-05 ENCOUNTER — Other Ambulatory Visit: Payer: Self-pay

## 2020-12-05 HISTORY — PX: COLONOSCOPY: SHX5424

## 2020-12-05 HISTORY — PX: SUBMUCOSAL TATTOO INJECTION: SHX6856

## 2020-12-05 HISTORY — PX: BIOPSY: SHX5522

## 2020-12-05 HISTORY — PX: POLYPECTOMY: SHX5525

## 2020-12-05 HISTORY — PX: ESOPHAGOGASTRODUODENOSCOPY (EGD) WITH PROPOFOL: SHX5813

## 2020-12-05 LAB — COMPREHENSIVE METABOLIC PANEL
ALT: 8 U/L (ref 0–44)
AST: 21 U/L (ref 15–41)
Albumin: 3.3 g/dL — ABNORMAL LOW (ref 3.5–5.0)
Alkaline Phosphatase: 104 U/L (ref 38–126)
Anion gap: 9 (ref 5–15)
BUN: 20 mg/dL (ref 8–23)
CO2: 26 mmol/L (ref 22–32)
Calcium: 9.1 mg/dL (ref 8.9–10.3)
Chloride: 103 mmol/L (ref 98–111)
Creatinine, Ser: 2.26 mg/dL — ABNORMAL HIGH (ref 0.61–1.24)
GFR, Estimated: 29 mL/min — ABNORMAL LOW (ref >60)
Glucose, Bld: 95 mg/dL (ref 70–99)
Potassium: 3.8 mmol/L (ref 3.5–5.1)
Sodium: 138 mmol/L (ref 135–145)
Total Bilirubin: 1.7 mg/dL — ABNORMAL HIGH (ref 0.3–1.2)
Total Protein: 6.6 g/dL (ref 6.5–8.1)

## 2020-12-05 LAB — CBC WITH DIFFERENTIAL/PLATELET
Abs Immature Granulocytes: 0.05 10*3/uL (ref 0.00–0.07)
Basophils Absolute: 0.1 10*3/uL (ref 0.0–0.1)
Basophils Relative: 1 %
Eosinophils Absolute: 0.2 10*3/uL (ref 0.0–0.5)
Eosinophils Relative: 2 %
HCT: 30.2 % — ABNORMAL LOW (ref 39.0–52.0)
Hemoglobin: 9.5 g/dL — ABNORMAL LOW (ref 13.0–17.0)
Immature Granulocytes: 0 %
Lymphocytes Relative: 12 %
Lymphs Abs: 1.5 10*3/uL (ref 0.7–4.0)
MCH: 31.6 pg (ref 26.0–34.0)
MCHC: 31.5 g/dL (ref 30.0–36.0)
MCV: 100.3 fL — ABNORMAL HIGH (ref 80.0–100.0)
Monocytes Absolute: 1.2 10*3/uL — ABNORMAL HIGH (ref 0.1–1.0)
Monocytes Relative: 9 %
Neutro Abs: 9.4 10*3/uL — ABNORMAL HIGH (ref 1.7–7.7)
Neutrophils Relative %: 76 %
Platelets: 343 10*3/uL (ref 150–400)
RBC: 3.01 MIL/uL — ABNORMAL LOW (ref 4.22–5.81)
RDW: 13.6 % (ref 11.5–15.5)
WBC: 12.4 10*3/uL — ABNORMAL HIGH (ref 4.0–10.5)
nRBC: 0 % (ref 0.0–0.2)

## 2020-12-05 LAB — MAGNESIUM: Magnesium: 2.2 mg/dL (ref 1.7–2.4)

## 2020-12-05 LAB — BRAIN NATRIURETIC PEPTIDE: B Natriuretic Peptide: 23.7 pg/mL (ref 0.0–100.0)

## 2020-12-05 LAB — PROTIME-INR
INR: 1.4 — ABNORMAL HIGH (ref 0.8–1.2)
Prothrombin Time: 17.4 seconds — ABNORMAL HIGH (ref 11.4–15.2)

## 2020-12-05 SURGERY — COLONOSCOPY
Anesthesia: Monitor Anesthesia Care

## 2020-12-05 MED ORDER — OXYCODONE HCL 5 MG PO TABS: 5.0000 mg | ORAL_TABLET | Freq: Once | ORAL | Status: DC | PRN

## 2020-12-05 MED ORDER — OXYCODONE HCL 5 MG/5ML PO SOLN: 5.0000 mg | Freq: Once | ORAL | Status: DC | PRN

## 2020-12-05 MED ORDER — FENTANYL CITRATE (PF) 100 MCG/2ML IJ SOLN: 25.0000 ug | INTRAMUSCULAR | Status: DC | PRN

## 2020-12-05 MED ORDER — SPOT INK MARKER SYRINGE KIT
PACK | SUBMUCOSAL | Status: DC | PRN
  Administered 2020-12-05: 5 mL via SUBMUCOSAL

## 2020-12-05 MED ORDER — LACTATED RINGERS IV SOLN: INTRAVENOUS | Status: DC | PRN

## 2020-12-05 MED ORDER — ACETAMINOPHEN 500 MG PO TABS: 1000.0000 mg | ORAL_TABLET | Freq: Once | ORAL | Status: DC | PRN

## 2020-12-05 MED ORDER — PROPOFOL 500 MG/50ML IV EMUL
INTRAVENOUS | Status: DC | PRN
  Administered 2020-12-05: 150 ug/kg/min via INTRAVENOUS

## 2020-12-05 MED ORDER — ACETAMINOPHEN 10 MG/ML IV SOLN: 1000.0000 mg | Freq: Once | INTRAVENOUS | Status: DC | PRN

## 2020-12-05 MED ORDER — PHENYLEPHRINE HCL (PRESSORS) 10 MG/ML IV SOLN
INTRAVENOUS | Status: DC | PRN
  Administered 2020-12-05: 40 ug via INTRAVENOUS

## 2020-12-05 MED ORDER — ACETAMINOPHEN 160 MG/5ML PO SOLN: 1000.0000 mg | Freq: Once | ORAL | Status: DC | PRN

## 2020-12-05 MED ORDER — SODIUM CHLORIDE 0.9 % IV SOLN: INTRAVENOUS | Status: DC

## 2020-12-05 SURGICAL SUPPLY — 15 items

## 2020-12-05 NOTE — Transfer of Care (Signed)
Immediate Anesthesia Transfer of Care Note  Patient: Bradley Stokes  Procedure(s) Performed: COLONOSCOPY ESOPHAGOGASTRODUODENOSCOPY (EGD) WITH PROPOFOL POLYPECTOMY BIOPSY SUBMUCOSAL TATTOO INJECTION  Patient Location: PACU  Anesthesia Type:General  Level of Consciousness: awake, alert , oriented and patient cooperative  Airway & Oxygen Therapy: Patient Spontanous Breathing  Post-op Assessment: Report given to RN, Post -op Vital signs reviewed and stable and Patient moving all extremities X 4  Post vital signs: Reviewed and stable  Last Vitals:  Vitals Value Taken Time  BP 136/66 12/05/20 1717  Temp    Pulse 62 12/05/20 1722  Resp 17 12/05/20 1722  SpO2 100 % 12/05/20 1722  Vitals shown include unvalidated device data.  Last Pain:  Vitals:   12/05/20 1530  TempSrc: Temporal  PainSc: 0-No pain      Patients Stated Pain Goal: 0 (49/82/64 1583)  Complications: No notable events documented.

## 2020-12-05 NOTE — Progress Notes (Signed)
Upon further discussion with patient, patient informed me he spoke with a Education officer, museum. I will reach out to Care Management regarding the patient's concerns.

## 2020-12-05 NOTE — Op Note (Addendum)
[facility] Patient Name: Bradley Stokes Procedure Date : 12/05/2020 MRN: [id number] Attending MD: [name] , MD Date of Birth: 1945/05/13 CSN: [id number] Age: 75 Admit Type: Inpatient Procedure:                Colonoscopy with biopsies & hot snare polypectomy x                            1. Indications:              Rectal bleeding, Acute post hemorrhagic anemia. Providers:                [name], MD, Dulcy Fanny, Janee Morn, Technician, Cleda Clarks, CRNA,                            Oleta Mouse, MD Referring MD:             [name]. [name], MD, Edrick Oh MD, Candee Furbish, MD Medicines:                Monitored Anesthesia Care Complications:            No immediate complications. Estimated Blood Loss:     Estimated blood loss was minimal. Procedure:                Pre-Anesthesia Assessment: - Prior to the                            procedure, a history and physical was performed,                            and patient medications and allergies were                            reviewed. The patient's tolerance of previous                            anesthesia was also reviewed. The risks and                            benefits of the procedure and the sedation options                            and risks were discussed with the patient. All                            questions were answered, and informed consent was                            obtained. Prior Anticoagulants: The patient has  taken Coumadin (warfarin), last dose was 4 days                            prior to procedure. ASA Grade Assessment: III - A                            patient with severe systemic disease. After                            reviewing the risks and benefits, the patient was                            deemed in satisfactory condition to undergo the                             procedure. - Prior to the procedure, a History and                            Physical was performed, and patient medications and                            allergies were reviewed. The patient's tolerance of                            previous anesthesia was also reviewed. The risks                            and benefits of the procedure and the sedation                            options and risks were discussed with the patient.                            All questions were answered, and informed consent                            was obtained. Prior Anticoagulants: The patient has                            taken Coumadin (Warfarin), last dose was 4 days                            prior to procedure. ASA Grade Assessment: III - A                            patient with severe systemic disease. After                            reviewing the risks and benefits, the patient was                            deemed in satisfactory condition to undergo the  procedure. After obtaining informed consent, the                            colonoscope was passed under direct vision.                            Throughout the procedure, the patient's blood                            pressure, pulse, and oxygen saturations were                            monitored continuously. The [id number] ([id number])                            Olympus coloscope was introduced through the anus                            and advanced to the the cecum, identified by                            appendiceal orifice and ileocecal valve. The                            colonoscopy was extremely difficult due to                            inadequate bowel prep. Successful completion of the                            procedure was aided by lavage. The patient                            tolerated the procedure well. The quality of the                            bowel preparation was poor. The ileocecal  valve,                            the appendiceal orifice and the rectum were                            photographed. The bowel preparation used was                            NuLytely via split dose instruction. Scope In: 4:45:36 PM Scope Out: 5:05:43 PM Scope Withdrawal Time: 0 hours 13 minutes 37 seconds  Total Procedure Duration: 0 hours 20 minutes 7 seconds  Findings:      Multiple small and large-mouthed diverticula were found in the sigmoid       colon.      An ulcerated, non-obstructing large mass was found in the proximal       descending colon; the mass was partially circumferential (involving       two-thirds of the lumen circumference);  the mass measured five cm in       length; oozing was present; this was biopsied. The mucosa 5 cm distal to       the mass was successfully injected with 3 mL Niger ink-drug delivery       seems adequate.      A 10 mm sessile polyp was found in the cecum; the polyp was removed with       a hot snare x 1-200/20; resection and retrieval were complete.      Moderate sized internal hemorrhoids were noted on retroflexion.      Extremely poor prep; polyps could be missed. Impression:               - Preparation of the colon was extremely poor                            inspite of aggressive lavage.                           - Diverticulosis in the sigmoid colon.                           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                           - One 10 mm polyp in the cecum, removed with a hot                            snare x 1; resected and retrieved.                           - Moderate sized internal hemorrhoids. Moderate Sedation:      MAC used. Recommendation:           - Clear liquid diet today.                           - Continue present medications.                            - Await pathology results.                           - Perform a CT scan (computed tomography) of chest                            with contrast, abdomen with contrast and pelvis                            without contrast ASAP.                           -  Check CEA levels.                           - Refer to a surgeon ASAP.                           - Repeat colonoscopy in 1 year for surveillance.                           - Return to GI office in 2 weeks. Procedure Code(s):        --- Professional ---                           [id number], Colonoscopy, flexible; with removal of                            tumor(s), polyp(s), or other lesion(s) by snare                            technique Diagnosis Code(s):        --- Professional ---                           K92.1, Melena (includes Hematochezia)                           D62, Acute posthemorrhagic anemia                           D49.0, Neoplasm of unspecified behavior of                            digestive system                           K63.5, Polyp of colon                           K57.30, Diverticulosis of large intestine without                            perforation or abscess without bleeding                           K64.8, Other hemorrhoids CPT copyright 2019 American Medical Association. All rights reserved. The codes documented in this report are preliminary and upon coder review may  be revised to meet current compliance requirements. [name], MD [name], MD 12/05/2020 5:38:21 PM This report has been signed electronically. Number of Addenda: 0

## 2020-12-05 NOTE — Progress Notes (Signed)
Patient back from endo. Alert and oriented x 4. Denies any complaints. IVF started. Bed alarm on and audible. Will continue to monitor.

## 2020-12-05 NOTE — Progress Notes (Signed)
Patient expressed concerns regarding household/ financial obligations that he believes he will be unable to manage/ take care of while in the hospital. He stated "I need to figure out a way to transfer money form my savings to my checking account. [place], and my sister too, if I can't find some way to do it." He stated that someone (a man) "came by to see me today from welfare something and I need to talk to him" but he cannot recall the man's name or role. This RN will communicate this information to the day shift RN at bedside report in the AM.

## 2020-12-05 NOTE — Progress Notes (Signed)
PROGRESS NOTE                                                                                                                                                                                                             Patient Demographics:    Bradley Stokes, is a 75 y.o. male, DOB - 1945/05/27, IEP:[id number]  Outpatient Primary MD for the patient is [name], [name], MD    LOS - 2  Admit date - 12/02/2020    Chief Complaint  Patient presents with   Rectal Bleeding       Brief Narrative (HPI from H&P)    Bradley Stokes is a 75 y.o. male with medical history significant of hypertension, hyperlipidemia, paroxysmal atrial fibrillation on Coumadin, osteoarthritis, and obesity who presents with complaints of bleeding rectally, he was noted to have INR of 2.9, H&H was stable he was admitted to the hospital for further blood per rectum work-up.   Subjective:   Patient in bed, appears comfortable, denies any headache, no fever, no chest pain or pressure, no shortness of breath , patient was unable to finish his entire GoLytely, had multiple bowel movements, no blood.      Assessment  & Plan :     Acute patient with history of diverticulosis in the past, causing acute lower GI blood loss related acute blood loss  anaemia  - INR gradually trending down, bleeding has slowed down. - on clears,  -GI input greatly appreciated, plan for EGD and colonoscopy today . -Continue with PPI . -monitor H&H, PPI, GI on board, Coumadin held, Vit K 0.5 x 1 on 12/04/20 - likely endoscopic eval on 9/7, monitor H&H. Likely diverticular bleed.  Paroxysmal A. fib with CHA2DS2-VASc 2 score of greater than 3.  Coumadin held due to above.  Continue amiodarone for rate control.  Morbid obesity.  BMI of 41.  Follow with PCP for weight loss.  Chronic diastolic CHF last known EF is 55% in 2016.  Has a chronic leg edema continue Lasix held for bowel prep  and mild AKI. Added TED stockings.  Dyslipidemia - statin.  AKI on CKD 3B - Baseline creatinine close to 2, hold lasix, gentle IVF on 12/04/20.         Condition -  Guarded  Family Communication  :  None present  Code Status :  Full  Consults  :  GI  PUD Prophylaxis : PPI   Procedures  :            Disposition Plan  :    Status is: Inpatient  Remains inpatient appropriate because:IV treatments appropriate due to intensity of illness or inability to take PO  Dispo: The patient is from: Home              Anticipated d/c is to: Home              Patient currently is not medically stable to d/c.   Difficult to place patient No  DVT Prophylaxis  :    Place TED hose Start: 12/03/20 1131 SCDs Start: 12/02/20 1125    Lab Results  Component Value Date   PLT 343 12/05/2020    Diet :  Diet Order             Diet NPO time specified  Diet effective now                    Inpatient Medications  Scheduled Meds:  allopurinol  100 mg Oral Daily   amiodarone  100 mg Oral Daily   ammonium lactate  1 application Topical BID   atorvastatin  20 mg Oral Daily   calcitRIOL  0.5 mcg Oral Daily   pantoprazole (PROTONIX) IV  40 mg Intravenous Q12H   phosphorus  250 mg Oral BID   potassium chloride SA  20 mEq Oral Daily   sodium chloride flush  3 mL Intravenous Q12H   Continuous Infusions:   PRN Meds:.acetaminophen **OR** acetaminophen, albuterol  Antibiotics  :    Anti-infectives (From admission, onward)    None          Phillips Climes M.D on 12/05/2020 at 11:04 AM  To page go to [email or web address]   [facility] -  Office  [phone]    See all Orders from today for further details    Objective:   Vitals:   12/04/20 0740 12/04/20 1203 12/04/20 1620 12/05/20 0747  BP: (!) 159/63 (!) 159/58 (!) 161/62 (!) 155/66  Pulse: 73 70 74 77  Resp: 15 16 16 18   Temp: (!) 97.3 F (36.3 C) 97.6 F (36.4 C) 97.8 F (36.6 C) 97.6 F (36.4 C)   TempSrc: Oral Oral Oral Oral  SpO2: 98% 97% 99% 94%  Weight:        Wt Readings from Last 3 Encounters:  12/03/20 127.4 kg  06/12/20 130.2 kg  12/21/19 135.5 kg     Intake/Output Summary (Last 24 hours) at 12/05/2020 1104 Last data filed at 12/04/2020 1646 Gross per 24 hour  Intake 912.72 ml  Output 150 ml  Net 762.72 ml     Physical Exam  Awake Alert, Oriented X 3, No new F.N deficits, Normal affect Symmetrical Chest wall movement, Good air movement bilaterally, CTAB RRR,No Gallops,Rubs or new Murmurs, No Parasternal Heave +ve B.Sounds, Abd Soft, No tenderness, No rebound - guarding or rigidity. 2+ leg edema with chronic discoloration due to venous stasis.   Data Review:    CBC Recent Labs  Lab 12/02/20 0911 12/02/20 1212 12/03/20 0056 12/03/20 0750 12/03/20 1419 12/04/20 0202 12/05/20 0033  WBC 7.0  --  8.7 8.0 9.9 8.8 12.4*  HGB 9.7*   < > 8.7* 9.1* 9.7* 8.8* 9.5*  HCT 31.7*   < > 27.1* 29.1* 30.3* 27.9* 30.2*  PLT 340  --  287 307 357 296 343  MCV 101.6*  --  98.9 100.7* 98.7 99.3 100.3*  MCH 31.1  --  31.8 31.5 31.6 31.3 31.6  MCHC 30.6  --  32.1 31.3 32.0 31.5 31.5  RDW 13.8  --  13.8 13.9 13.7 13.8 13.6  LYMPHSABS 1.3  --   --   --   --  1.9 1.5  MONOABS 0.5  --   --   --   --  1.0 1.2*  EOSABS 0.1  --   --   --   --  0.3 0.2  BASOSABS 0.0  --   --   --   --  0.1 0.1   < > = values in this interval not displayed.    Recent Labs  Lab 12/02/20 0911 12/03/20 0056 12/04/20 0202 12/05/20 0033  NA 141 140 140 138  K 3.5 3.7 3.5 3.8  CL 106 107 104 103  CO2 26 24 26 26   GLUCOSE 104* 85 86 95  BUN 16 18 20 20   CREATININE 2.07* 2.13* 2.38* 2.26*  CALCIUM 8.8* 8.5* 8.5* 9.1  AST 14*  --  15 21  ALT 6  --  6 8  ALKPHOS 108  --  99 104  BILITOT 1.0  --  1.5* 1.7*  ALBUMIN 3.1*  --  3.0* 3.3*  MG  --   --  2.1 2.2  INR 2.9* 2.6* 2.3* 1.4*  BNP  --   --  23.2 23.7     ------------------------------------------------------------------------------------------------------------------ No results for input(s): CHOL, HDL, LDLCALC, TRIG, CHOLHDL, LDLDIRECT in the last 72 hours.  No results found for: HGBA1C ------------------------------------------------------------------------------------------------------------------ No results for input(s): TSH, T4TOTAL, T3FREE, THYROIDAB in the last 72 hours.  Invalid input(s): FREET3  Cardiac Enzymes No results for input(s): CKMB, TROPONINI, MYOGLOBIN in the last 168 hours.  Invalid input(s): CK ------------------------------------------------------------------------------------------------------------------    Component Value Date/Time   BNP 23.7 12/05/2020 0033     Radiology Reports DG CHEST PORT 1 VIEW  Result Date: 12/02/2020 CLINICAL DATA:  Chronic atrial fibrillation. Reported history of rectal bleeding. EXAM: PORTABLE CHEST 1 VIEW COMPARISON:  Chest radiograph dated 07/10/2016. FINDINGS: The heart size is enlarged. Vascular calcifications are seen in the aortic arch. The left costophrenic angle is obscured which may reflect pericardial fat versus a pleural effusion. The right lung is clear and there is no right pleural effusion. There is no pneumothorax. The visualized skeletal structures are unremarkable. IMPRESSION: Cardiomegaly. Obscured left costophrenic angle may represent pericardial fat versus a pleural effusion. Aortic Atherosclerosis (ICD10-I70.0). Electronically Signed   By: Zerita Boers M.D.   On: 12/02/2020 15:11

## 2020-12-05 NOTE — H&P (View-Only) (Signed)
Physical Therapy Treatment Patient Details Name: Bradley Stokes MRN: [id number] DOB: 01/08/1946 Today's Date: 12/05/2020    History of Present Illness Pt is a 75yo male presenting to [facility] ED on 9/4 reporting dark stools, likely secondary to GI bleed. Chest xray showed cardiomegaly.  PMH: HTN, PAF, gout, CHF, LE edema.   PT Comments    Pt is progressing toward his goals. Pt requested bed level exercises today as he had not yet gone to his colonoscopy and was concerned about "messing up" procedure. Session consisted of bed level exercises that were limited by pt's fatigue. Pt expressed concern about his sister and that they should be placed together. Educated pt about the importance of rehabilitation so he can be better able to assist his sister. Discharge plan remains appropriate. We will continue to follow him acutely to promote independence with functional mobility.     Follow Up Recommendations  SNF;Supervision for mobility/OOB     Equipment Recommendations  3in1 (PT);Rolling walker with 5" wheels (Bariatric)    Recommendations for Other Services       Precautions / Restrictions Precautions Precautions: Fall Restrictions Weight Bearing Restrictions: No    Mobility  Bed Mobility               General bed mobility comments: Pt requested to perform bed level exercises as he has not yet had his colonoscopy and wanted to be sure he wouldn't "mess up" the procedure.    Transfers                    Ambulation/Gait                 Stairs             Wheelchair Mobility    Modified Rankin (Stroke Patients Only)       Balance                                            Cognition Arousal/Alertness: Awake/alert Behavior During Therapy: WFL for tasks assessed/performed Overall Cognitive Status: No family/caregiver present to determine baseline cognitive functioning                                 General Comments:  Very talkative.      Exercises General Exercises - Lower Extremity Straight Leg Raises: AROM;10 reps;Both;Supine (3 sets of 10 reps bilaterally; verbal cues for pacing and continued breathing; tactile cues for range.) Low Level/ICU Exercises Ankle Circles/Pumps: AROM;20 reps;Both (Verbal cues for slow and controlled) Stabilized Bridging: AROM;20 reps;Supine (10X2 with rest in between, verbal cues for posterior pelvic tilt and press up through heels; continued breathing.) Shoulder Flexion: AROM;Both;10 reps;Supine (3 sets of 10 reps, verbal cues for slow and controlled and continued breathing)    General Comments General comments (skin integrity, edema, etc.): Pt very concerned about d/c plan for he and his sister. Wants to talk with CSW when sister is present.      Pertinent Vitals/Pain Pain Assessment: No/denies pain    Home Living                      Prior Function            PT Goals (current goals can now be found in the care plan section) Acute  Rehab PT Goals PT Goal Formulation: With patient Time For Goal Achievement: 12/17/20 Potential to Achieve Goals: Good Progress towards PT goals: Progressing toward goals    Frequency    Min 2X/week      PT Plan Current plan remains appropriate    Co-evaluation              AM-PAC PT "6 Clicks" Mobility   Outcome Measure  Help needed turning from your back to your side while in a flat bed without using bedrails?: A Little Help needed moving from lying on your back to sitting on the side of a flat bed without using bedrails?: A Little Help needed moving to and from a bed to a chair (including a wheelchair)?: A Lot Help needed standing up from a chair using your arms (e.g., wheelchair or bedside chair)?: A Little Help needed to walk in hospital room?: A Lot Help needed climbing 3-5 steps with a railing? : A Lot 6 Click Score: 15    End of Session   Activity Tolerance: Patient limited by fatigue Patient  left: in bed;with call bell/phone within reach;with bed alarm set Nurse Communication: Mobility status PT Visit Diagnosis: History of falling (Z91.81);Muscle weakness (generalized) (M62.81);Other abnormalities of gait and mobility (R26.89)     Time: 3818-2993 PT Time Calculation (min) (ACUTE ONLY): 28 min  Charges:  $Therapeutic Exercise: 23-37 mins                     Dawayne Cirri, SPT Dawayne Cirri 12/05/2020, 3:11 PM

## 2020-12-05 NOTE — Op Note (Signed)
[facility] Patient Name: Bradley Stokes Procedure Date : 12/05/2020 MRN: [id number] Attending MD: [name] , MD Date of Birth: 03-06-1946 CSN: [id number] Age: 75 Admit Type: Inpatient Procedure:                Diagnostic EGD. Indications:              Acute post hemorrhagic anemia, Hematochezia Providers:                [name], MD, Dulcy Fanny, Janee Morn, Technician Referring MD:             [name]. [name], MD, Edrick Oh MD, Candee Furbish MD Medicines:                Monitored Anesthesia Care Complications:            No immediate complications. Estimated Blood Loss:     Estimated blood loss: none. Procedure:                Pre-Anesthesia Assessment: - Prior to the                            procedure, a history and physical was performed,                            and patient medications and allergies were                            reviewed. The patient's tolerance of previous                            anesthesia was also reviewed. The risks and                            benefits of the procedure and the sedation options                            and risks were discussed with the patient. All                            questions were answered, and informed consent was                            obtained. Prior Anticoagulants: The patient has                            taken Coumadin (warfarin), last dose was 4 days                            prior to procedure. ASA Grade Assessment: III - A  patient with severe systemic disease. After                            reviewing the risks and benefits, the patient was                            deemed in satisfactory condition to undergo the                            procedure. After obtaining informed consent, the                            endoscope was passed under direct vision.                             Throughout the procedure, the patient's blood                            pressure, pulse, and oxygen saturations were                            monitored continuously. The [id number]-[id number] ([id number])                            Olympus endoscope was introduced through the mouth,                            and advanced to the second part of duodenum. The                            EGD was accomplished without difficulty. The                            patient tolerated the procedure well. Scope In: Scope Out: Findings:      The examined esophagus and GEJ appeared widely patent and normal; SCJ       was measured at 40 cm.      The entire examined stomach was normal.      The cardia and gastric fundus were normal on retroflexion.      The examined duodenum was normal. Impression:               - Normal appearing, widely patent esophagus and GEJ.                           - Normal appearing stomach.                           - Normal examined duodenum.                           - No specimens collected. Moderate Sedation:      MAC used. Recommendation:           - Clear liquid diet today.                           -  Proceed with a colonoscopy. Procedure Code(s):        --- Professional ---                           [phone], Esophagogastroduodenoscopy, flexible,                            transoral; diagnostic, including collection of                            specimen(s) by brushing or washing, when performed                            (separate procedure) Diagnosis Code(s):        --- Professional ---                           K92.1, Melena (includes Hematochezia)                           D62, Acute posthemorrhagic anemia CPT copyright 2019 American Medical Association. All rights reserved. The codes documented in this report are preliminary and upon coder review may  be revised to meet current compliance requirements. [name], MD [name], MD 12/05/2020 5:17:33 PM This report has been  signed electronically. Number of Addenda: 0

## 2020-12-05 NOTE — Anesthesia Preprocedure Evaluation (Addendum)
Anesthesia Evaluation  Patient identified by MRN, date of birth, ID band Patient awake    Reviewed: Allergy & Precautions, Patient's Chart, lab work & pertinent test results  History of Anesthesia Complications Negative for: history of anesthetic complications  Airway Mallampati: III  TM Distance: >3 FB Neck ROM: Full    Dental  (+) Dental Advisory Given, Edentulous Upper, Edentulous Lower   Pulmonary neg pulmonary ROS,    breath sounds clear to auscultation       Cardiovascular hypertension, + dysrhythmias (coumadin) Atrial Fibrillation + Valvular Problems/Murmurs (mild AI) AI  Rhythm:Regular  Echo 2016: - Left ventricle: The cavity size was normal. Wall thickness was  normal. Systolic function was normal. The estimated ejection  fraction was in the range of 55% to 60%.  - Aortic valve: There was mild regurgitation.  - Left atrium: The atrium was moderately dilated.    Neuro/Psych negative neurological ROS  negative psych ROS   GI/Hepatic Neg liver ROS, Hematochezia, anemia    Endo/Other  Morbid obesityBMI 42  Renal/GU Renal Insufficiency and CRFRenal diseaseCr 2.26     Musculoskeletal  (+) Arthritis , Osteoarthritis,    Abdominal   Peds  Hematology  (+) Blood dyscrasia, anemia , 9.5/30.2 INR 1.4   Anesthesia Other Findings   Reproductive/Obstetrics                            Anesthesia Physical Anesthesia Plan  ASA: 3  Anesthesia Plan: MAC   Post-op Pain Management:    Induction:   PONV Risk Score and Plan: 2 and Propofol infusion and TIVA  Airway Management Planned: Natural Airway and Simple Face Mask  Additional Equipment: None  Intra-op Plan:   Post-operative Plan:   Informed Consent: I have reviewed the patients History and Physical, chart, labs and discussed the procedure including the risks, benefits and alternatives for the proposed anesthesia with the patient  or authorized representative who has indicated his/her understanding and acceptance.     Dental advisory given  Plan Discussed with: CRNA and Anesthesiologist  Anesthesia Plan Comments:        Anesthesia Quick Evaluation

## 2020-12-05 NOTE — Progress Notes (Signed)
Physical Therapy Treatment Patient Details Name: Bradley Stokes MRN: [id number] DOB: 10-Jul-1945 Today's Date: 12/05/2020    History of Present Illness Pt is a 75yo male presenting to [facility] ED on 9/4 reporting dark stools, likely secondary to GI bleed. Chest xray showed cardiomegaly.  PMH: HTN, PAF, gout, CHF, LE edema.   PT Comments    Pt is progressing toward his goals. Pt requested bed level exercises today as he had not yet gone to his colonoscopy and was concerned about "messing up" procedure. Session consisted of bed level exercises that were limited by pt's fatigue. Pt expressed concern about his sister and that they should be placed together. Educated pt about the importance of rehabilitation so he can be better able to assist his sister. Discharge plan remains appropriate. We will continue to follow him acutely to promote independence with functional mobility.     Follow Up Recommendations  SNF;Supervision for mobility/OOB     Equipment Recommendations  3in1 (PT);Rolling walker with 5" wheels (Bariatric)    Recommendations for Other Services       Precautions / Restrictions Precautions Precautions: Fall Restrictions Weight Bearing Restrictions: No    Mobility  Bed Mobility               General bed mobility comments: Pt requested to perform bed level exercises as he has not yet had his colonoscopy and wanted to be sure he wouldn't "mess up" the procedure.    Transfers                    Ambulation/Gait                 Stairs             Wheelchair Mobility    Modified Rankin (Stroke Patients Only)       Balance                                            Cognition Arousal/Alertness: Awake/alert Behavior During Therapy: WFL for tasks assessed/performed Overall Cognitive Status: No family/caregiver present to determine baseline cognitive functioning                                 General Comments:  Very talkative.      Exercises General Exercises - Lower Extremity Straight Leg Raises: AROM;10 reps;Both;Supine (3 sets of 10 reps bilaterally; verbal cues for pacing and continued breathing; tactile cues for range.) Low Level/ICU Exercises Ankle Circles/Pumps: AROM;20 reps;Both (Verbal cues for slow and controlled) Stabilized Bridging: AROM;20 reps;Supine (10X2 with rest in between, verbal cues for posterior pelvic tilt and press up through heels; continued breathing.) Shoulder Flexion: AROM;Both;10 reps;Supine (3 sets of 10 reps, verbal cues for slow and controlled and continued breathing)    General Comments General comments (skin integrity, edema, etc.): Pt very concerned about d/c plan for he and his sister. Wants to talk with CSW when sister is present.      Pertinent Vitals/Pain Pain Assessment: No/denies pain    Home Living                      Prior Function            PT Goals (current goals can now be found in the care plan section) Acute  Rehab PT Goals PT Goal Formulation: With patient Time For Goal Achievement: 12/17/20 Potential to Achieve Goals: Good Progress towards PT goals: Progressing toward goals    Frequency    Min 2X/week      PT Plan Current plan remains appropriate    Co-evaluation              AM-PAC PT "6 Clicks" Mobility   Outcome Measure  Help needed turning from your back to your side while in a flat bed without using bedrails?: A Little Help needed moving from lying on your back to sitting on the side of a flat bed without using bedrails?: A Little Help needed moving to and from a bed to a chair (including a wheelchair)?: A Lot Help needed standing up from a chair using your arms (e.g., wheelchair or bedside chair)?: A Little Help needed to walk in hospital room?: A Lot Help needed climbing 3-5 steps with a railing? : A Lot 6 Click Score: 15    End of Session   Activity Tolerance: Patient limited by fatigue Patient  left: in bed;with call bell/phone within reach;with bed alarm set Nurse Communication: Mobility status PT Visit Diagnosis: History of falling (Z91.81);Muscle weakness (generalized) (M62.81);Other abnormalities of gait and mobility (R26.89)     Time: 4163-8453 PT Time Calculation (min) (ACUTE ONLY): 28 min  Charges:  $Therapeutic Exercise: 23-37 mins                     Dawayne Cirri, SPT Dawayne Cirri 12/05/2020, 3:11 PM

## 2020-12-05 NOTE — Interval H&P Note (Signed)
History and Physical Interval Note:  12/05/2020 4:20 PM  Bradley Stokes  has presented today for surgery, with the diagnosis of Hematochezia and anemia.  The various methods of treatment have been discussed with the patient and family. After consideration of risks, benefits and other options for treatment, the patient has consented to  Procedure(s): COLONOSCOPY (N/A) ESOPHAGOGASTRODUODENOSCOPY (EGD) WITH PROPOFOL (N/A) as a surgical intervention.  The patient's history has been reviewed, patient examined, no change in status, stable for surgery.  I have reviewed the patient's chart and labs.  Questions were answered to the patient's satisfaction.     [name]

## 2020-12-06 ENCOUNTER — Inpatient Hospital Stay (HOSPITAL_COMMUNITY): Payer: Medicare HMO

## 2020-12-06 DIAGNOSIS — N179 Acute kidney failure, unspecified: Secondary | ICD-10-CM

## 2020-12-06 DIAGNOSIS — K6389 Other specified diseases of intestine: Secondary | ICD-10-CM

## 2020-12-06 LAB — COMPREHENSIVE METABOLIC PANEL
ALT: 8 U/L (ref 0–44)
AST: 21 U/L (ref 15–41)
Albumin: 2.8 g/dL — ABNORMAL LOW (ref 3.5–5.0)
Alkaline Phosphatase: 93 U/L (ref 38–126)
Anion gap: 9 (ref 5–15)
BUN: 19 mg/dL (ref 8–23)
CO2: 25 mmol/L (ref 22–32)
Calcium: 8.6 mg/dL — ABNORMAL LOW (ref 8.9–10.3)
Chloride: 106 mmol/L (ref 98–111)
Creatinine, Ser: 2.1 mg/dL — ABNORMAL HIGH (ref 0.61–1.24)
GFR, Estimated: 32 mL/min — ABNORMAL LOW (ref >60)
Glucose, Bld: 82 mg/dL (ref 70–99)
Potassium: 3.7 mmol/L (ref 3.5–5.1)
Sodium: 140 mmol/L (ref 135–145)
Total Bilirubin: 1.3 mg/dL — ABNORMAL HIGH (ref 0.3–1.2)
Total Protein: 5.7 g/dL — ABNORMAL LOW (ref 6.5–8.1)

## 2020-12-06 LAB — CBC WITH DIFFERENTIAL/PLATELET
Abs Immature Granulocytes: 0.04 10*3/uL (ref 0.00–0.07)
Basophils Absolute: 0 10*3/uL (ref 0.0–0.1)
Basophils Relative: 0 %
Eosinophils Absolute: 0.5 10*3/uL (ref 0.0–0.5)
Eosinophils Relative: 5 %
HCT: 26 % — ABNORMAL LOW (ref 39.0–52.0)
Hemoglobin: 8.2 g/dL — ABNORMAL LOW (ref 13.0–17.0)
Immature Granulocytes: 0 %
Lymphocytes Relative: 14 %
Lymphs Abs: 1.3 10*3/uL (ref 0.7–4.0)
MCH: 31.8 pg (ref 26.0–34.0)
MCHC: 31.5 g/dL (ref 30.0–36.0)
MCV: 100.8 fL — ABNORMAL HIGH (ref 80.0–100.0)
Monocytes Absolute: 0.8 10*3/uL (ref 0.1–1.0)
Monocytes Relative: 8 %
Neutro Abs: 6.8 10*3/uL (ref 1.7–7.7)
Neutrophils Relative %: 73 %
Platelets: 297 10*3/uL (ref 150–400)
RBC: 2.58 MIL/uL — ABNORMAL LOW (ref 4.22–5.81)
RDW: 13.7 % (ref 11.5–15.5)
WBC: 9.4 10*3/uL (ref 4.0–10.5)
nRBC: 0 % (ref 0.0–0.2)

## 2020-12-06 LAB — CEA: CEA: 3.4 ng/mL (ref 0.0–4.7)

## 2020-12-06 LAB — MAGNESIUM: Magnesium: 2.1 mg/dL (ref 1.7–2.4)

## 2020-12-06 LAB — PROTIME-INR
INR: 1.2 (ref 0.8–1.2)
Prothrombin Time: 15.3 seconds — ABNORMAL HIGH (ref 11.4–15.2)

## 2020-12-06 LAB — BRAIN NATRIURETIC PEPTIDE: B Natriuretic Peptide: 79.8 pg/mL (ref 0.0–100.0)

## 2020-12-06 MED ORDER — CHLORHEXIDINE GLUCONATE CLOTH 2 % EX PADS
6.0000 | MEDICATED_PAD | Freq: Once | CUTANEOUS | Status: AC
  Administered 2020-12-07: 6 via TOPICAL

## 2020-12-06 MED ORDER — ENSURE PRE-SURGERY PO LIQD
592.0000 mL | Freq: Once | ORAL | Status: AC
  Administered 2020-12-06: 592 mL via ORAL
  Filled 2020-12-06: qty 592

## 2020-12-06 MED ORDER — HEPARIN SODIUM (PORCINE) 5000 UNIT/ML IJ SOLN
5000.0000 [IU] | Freq: Once | INTRAMUSCULAR | Status: AC
  Administered 2020-12-08: 5000 [IU] via SUBCUTANEOUS
  Filled 2020-12-06: qty 1

## 2020-12-06 MED ORDER — PANTOPRAZOLE SODIUM 40 MG PO TBEC
40.0000 mg | DELAYED_RELEASE_TABLET | Freq: Every day | ORAL | Status: DC
  Administered 2020-12-06 – 2020-12-13 (×8): 40 mg via ORAL
  Filled 2020-12-06 (×8): qty 1

## 2020-12-06 MED ORDER — ENSURE PRE-SURGERY PO LIQD
296.0000 mL | Freq: Once | ORAL | Status: AC
  Administered 2020-12-07: 296 mL via ORAL
  Filled 2020-12-06: qty 296

## 2020-12-06 MED ORDER — IOHEXOL 9 MG/ML PO SOLN
500.0000 mL | ORAL | Status: AC
  Administered 2020-12-06 (×2): 500 mL via ORAL

## 2020-12-06 MED ORDER — ACETAMINOPHEN 500 MG PO TABS
1000.0000 mg | ORAL_TABLET | ORAL | Status: AC
  Filled 2020-12-06: qty 2

## 2020-12-06 MED ORDER — NEOMYCIN SULFATE 500 MG PO TABS
1000.0000 mg | ORAL_TABLET | ORAL | Status: AC
  Administered 2020-12-06 – 2020-12-07 (×3): 1000 mg via ORAL
  Filled 2020-12-06 (×3): qty 2

## 2020-12-06 MED ORDER — CHLORHEXIDINE GLUCONATE CLOTH 2 % EX PADS
6.0000 | MEDICATED_PAD | Freq: Once | CUTANEOUS | Status: AC
  Administered 2020-12-06: 6 via TOPICAL

## 2020-12-06 MED ORDER — METRONIDAZOLE 500 MG PO TABS
1000.0000 mg | ORAL_TABLET | ORAL | Status: AC
  Administered 2020-12-06 – 2020-12-07 (×3): 1000 mg via ORAL
  Filled 2020-12-06 (×3): qty 2

## 2020-12-06 MED ORDER — IOHEXOL 350 MG/ML SOLN
80.0000 mL | Freq: Once | INTRAVENOUS | Status: AC | PRN
  Administered 2020-12-06: 80 mL via INTRAVENOUS

## 2020-12-06 MED ORDER — SODIUM CHLORIDE 0.9 % IV SOLN
2.0000 g | INTRAVENOUS | Status: DC
  Filled 2020-12-06 (×2): qty 2

## 2020-12-06 MED ORDER — POLYETHYLENE GLYCOL 3350 17 GM/SCOOP PO POWD
0.5000 | Freq: Once | ORAL | Status: AC
  Administered 2020-12-06: 127.5 g via ORAL
  Filled 2020-12-06: qty 255

## 2020-12-06 MED ORDER — BUPIVACAINE LIPOSOME 1.3 % IJ SUSP: 20.0000 mL | Freq: Once | INTRAMUSCULAR | Status: DC

## 2020-12-06 NOTE — Care Management Important Message (Signed)
Important Message  Patient Details  Name: Bradley Stokes MRN: [id number] Date of Birth: 09/23/1945   Medicare Important Message Given:  Yes     Zaron Zwiefelhofer [address] 12/06/2020, 3:10 PM

## 2020-12-06 NOTE — Consult Note (Addendum)
Consult Note  Bradley Stokes 1945/11/28  [id number].    Requesting MD: Dr. Collene Mares Chief Complaint/Reason for Consult: proximal descending colon mass  HPI:  75 yo male with medical history significant for hypertension, hyperlipidemia, paroxysmal atrial fibrillation on coumadin, osteoarthritis, obesity who presented to [facility] ED secondary to rectal bleeding. Bleeding had been going on intermittently with bowel movements for about 1 week and had worsened to large quantity on date of presentation with associated weakness.  Work up in the ED was significant for hgb 9.7 and creatinine 2.07 and maroon colored stools. He was admitted to the hospitalist service GI was consulted and EGD and colonoscopy performed showing: diverticulosis in the sigmoid colon, Likely malignant 5 cm tumor in the proximal descending colon-biopsied; mucosa 5 cm distal to the mass was injected with 3 cc's of [place] ink, One 10 mm polyp in the cecum, removed with a hot snare x 1; resected and retrieved.  General surgery was asked to see.  He states he is feeling well this am with only mild LLQ abdominal pain with palpation. He had some nausea with his endoscopy prep but otherwise has not had nausea/emesis. Passing flatus but no BM since endoscopy. ROS otherwise as below   Substance use: none Allergies: paper tape Blood thinners: coumadin for a fib Past Surgeries: none  ROS: Review of Systems  Constitutional:  Negative for chills and fever.  Respiratory:  Negative for cough and shortness of breath.   Cardiovascular:  Negative for chest pain and palpitations.  Gastrointestinal:  Positive for abdominal pain and blood in stool. Negative for constipation, diarrhea, nausea and vomiting.  Genitourinary: Negative.    Family History  Problem Relation Age of Onset   Pneumonia Mother    Heart failure Mother    Kidney failure Sister    Heart disease Brother    Crohn's disease Sister     Past Medical History:   Diagnosis Date   Arthritis    severe arthritis and deformity of his knee's   Chest pain    Chronic atrial fibrillation (HCC)    Chronic renal disease, stage III (HCC)    Chronic venous stasis dermatitis    Edema    Gout    History of cardiovascular stress test 01/17/2008   EF- 62%   Hypercholesterolemia    Hyperlipidemia    Hypertension    Kidney stones    Long term (current) use of anticoagulants    Long-term (current) use of anticoagulants    Obesity    Osteoarthritis    PAF (paroxysmal atrial fibrillation) (HCC)    Paroxysmal atrial fibrillation (HCC)    Secondary hypercoagulable state ([place])     Past Surgical History:  Procedure Laterality Date   CARDIOLITE STRESS TEST     EF 62%    Social History:  reports that he has never smoked. He has never used smokeless tobacco. He reports that he does not drink alcohol and does not use drugs.  Allergies:  Allergies  Allergen Reactions   Other     Tape,needs paper tape    Medications Prior to Admission  Medication Sig Dispense Refill   acetaminophen (TYLENOL) 325 MG tablet Take 325 mg by mouth every 6 (six) hours as needed (pain).      allopurinol (ZYLOPRIM) 100 MG tablet Take 100 mg by mouth daily.     amiodarone (PACERONE) 200 MG tablet 1/2 tablet daily (Patient taking differently: Take 100 mg by mouth daily. 1/2 tablet daily)  45 tablet 3   ammonium lactate (LAC-HYDRIN) 12 % lotion Apply 1 application topically 2 (two) times daily.     atorvastatin (LIPITOR) 20 MG tablet Take 20 mg by mouth Daily.     calcitRIOL (ROCALTROL) 0.5 MCG capsule Take 0.5 mcg daily by mouth.      furosemide (LASIX) 20 MG tablet TAKE 2 TABLETS BY MOUTH TWICE DAILY (Patient taking differently: Take 40 mg by mouth 2 (two) times daily.) 120 tablet 0   K Phos Mono-Sod Phos Di & Mono (VIRT-PHOS 250 NEUTRAL) [phone] MG TABS Take 1 tablet by mouth 2 (two) times a day.      potassium chloride SA (KLOR-CON) 20 MEQ tablet TAKE 1 TABLET BY MOUTH DAILY  (Patient taking differently: Take 20 mEq by mouth daily. TAKE 1 TABLET BY MOUTH DAILY) 90 tablet 3   warfarin (COUMADIN) 2 MG tablet Take 4-5 mg by mouth one time only at 6 PM. 4 mg Monday, Wednesday & Friday. All other days- 2.5 tabs for 5 mg all other days  0    Blood pressure (!) 157/65, pulse 84, temperature 97.6 F (36.4 C), resp. rate 16, weight 127.4 kg, SpO2 99 %. Physical Exam: General: pleasant, WD, male who is laying in bed in NAD HEENT: head is normocephalic, atraumatic.  Sclera are noninjected.  Pupils equal and round.  Ears and nose without any masses or lesions.  Mouth is pink and moist Heart: regular, rate, and rhythm.  Normal s1,s2. No obvious murmurs, gallops, or rubs noted.  Palpable radial and pedal pulses bilaterally Lungs: CTAB, no wheezes, rhonchi, or rales noted.  Respiratory effort nonlabored Abd: soft, ND, +BS, no masses, hernias, or organomegaly, very mild TTP LLQ without rebound or guarding MS: all 4 extremities are symmetrical with no cyanosis, clubbing, or edema. Skin: warm and dry with no masses, lesions, or rashes Neuro: Cranial nerves 2-12 grossly intact, sensation is normal throughout Psych: A&Ox3 with an appropriate affect.   Results for orders placed or performed during the hospital encounter of 12/02/20 (from the past 48 hour(s))  Protime-INR     Status: Abnormal   Collection Time: 12/05/20 12:33 AM  Result Value Ref Range   Prothrombin Time 17.4 (H) 11.4 - 15.2 seconds   INR 1.4 (H) 0.8 - 1.2    Comment: (NOTE) INR goal varies based on device and disease states. Performed at [facility], [place] [address], [place], [place] [address]   Magnesium     Status: None   Collection Time: 12/05/20 12:33 AM  Result Value Ref Range   Magnesium 2.2 1.7 - 2.4 mg/dL    Comment: Performed at [facility] [address] [place], [place] [address]  Comprehensive metabolic panel     Status: Abnormal   Collection Time: 12/05/20 12:33 AM  Result Value  Ref Range   Sodium 138 135 - 145 mmol/L   Potassium 3.8 3.5 - 5.1 mmol/L   Chloride 103 98 - 111 mmol/L   CO2 26 22 - 32 mmol/L   Glucose, Bld 95 70 - 99 mg/dL    Comment: Glucose reference range applies only to samples taken after fasting for at least 8 hours.   BUN 20 8 - 23 mg/dL   Creatinine, Ser 2.26 (H) 0.61 - 1.24 mg/dL   Calcium 9.1 8.9 - 10.3 mg/dL   Total Protein 6.6 6.5 - 8.1 g/dL   Albumin 3.3 (L) 3.5 - 5.0 g/dL   AST 21 15 - 41 U/L  ALT 8 0 - 44 U/L   Alkaline Phosphatase 104 38 - 126 U/L   Total Bilirubin 1.7 (H) 0.3 - 1.2 mg/dL   GFR, Estimated 29 (L) >60 mL/min    Comment: (NOTE) Calculated using the CKD-EPI Creatinine Equation (2021)    Anion gap 9 5 - 15    Comment: Performed at [facility] [address], [place], [place] [address]  CBC with Differential/Platelet     Status: Abnormal   Collection Time: 12/05/20 12:33 AM  Result Value Ref Range   WBC 12.4 (H) 4.0 - 10.5 K/uL   RBC 3.01 (L) 4.22 - 5.81 MIL/uL   Hemoglobin 9.5 (L) 13.0 - 17.0 g/dL   HCT 30.2 (L) 39.0 - 52.0 %   MCV 100.3 (H) 80.0 - 100.0 fL   MCH 31.6 26.0 - 34.0 pg   MCHC 31.5 30.0 - 36.0 g/dL   RDW 13.6 11.5 - 15.5 %   Platelets 343 150 - 400 K/uL   nRBC 0.0 0.0 - 0.2 %   Neutrophils Relative % 76 %   Neutro Abs 9.4 (H) 1.7 - 7.7 K/uL   Lymphocytes Relative 12 %   Lymphs Abs 1.5 0.7 - 4.0 K/uL   Monocytes Relative 9 %   Monocytes Absolute 1.2 (H) 0.1 - 1.0 K/uL   Eosinophils Relative 2 %   Eosinophils Absolute 0.2 0.0 - 0.5 K/uL   Basophils Relative 1 %   Basophils Absolute 0.1 0.0 - 0.1 K/uL   Immature Granulocytes 0 %   Abs Immature Granulocytes 0.05 0.00 - 0.07 K/uL    Comment: Performed at [facility], [place] [address] [place], [place] [address]  Brain natriuretic peptide     Status: None   Collection Time: 12/05/20 12:33 AM  Result Value Ref Range   B Natriuretic Peptide 23.7 0.0 - 100.0 pg/mL    Comment: Performed at [facility] [address],  [place], [place] [address]  Protime-INR     Status: Abnormal   Collection Time: 12/06/20  1:44 AM  Result Value Ref Range   Prothrombin Time 15.3 (H) 11.4 - 15.2 seconds   INR 1.2 0.8 - 1.2    Comment: (NOTE) INR goal varies based on device and disease states. Performed at [facility], [place] [address] [place], [place] [address]   Magnesium     Status: None   Collection Time: 12/06/20  1:44 AM  Result Value Ref Range   Magnesium 2.1 1.7 - 2.4 mg/dL    Comment: Performed at [facility] [address] [place],  [address]  Comprehensive metabolic panel     Status: Abnormal   Collection Time: 12/06/20  1:44 AM  Result Value Ref Range   Sodium 140 135 - 145 mmol/L   Potassium 3.7 3.5 - 5.1 mmol/L   Chloride 106 98 - 111 mmol/L   CO2 25 22 - 32 mmol/L   Glucose, Bld 82 70 - 99 mg/dL    Comment: Glucose reference range applies only to samples taken after fasting for at least 8 hours.   BUN 19 8 - 23 mg/dL   Creatinine, Ser 2.10 (H) 0.61 - 1.24 mg/dL   Calcium 8.6 (L) 8.9 - 10.3 mg/dL   Total Protein 5.7 (L) 6.5 - 8.1 g/dL   Albumin 2.8 (L) 3.5 - 5.0 g/dL   AST 21 15 - 41 U/L   ALT 8 0 - 44 U/L   Alkaline Phosphatase  93 38 - 126 U/L   Total Bilirubin 1.3 (H) 0.3 - 1.2 mg/dL   GFR, Estimated 32 (L) >60 mL/min    Comment: (NOTE) Calculated using the CKD-EPI Creatinine Equation (2021)    Anion gap 9 5 - 15    Comment: Performed at [facility] [address], [place], [place] [address]  CBC with Differential/Platelet     Status: Abnormal   Collection Time: 12/06/20  1:44 AM  Result Value Ref Range   WBC 9.4 4.0 - 10.5 K/uL   RBC 2.58 (L) 4.22 - 5.81 MIL/uL   Hemoglobin 8.2 (L) 13.0 - 17.0 g/dL   HCT 26.0 (L) 39.0 - 52.0 %   MCV 100.8 (H) 80.0 - 100.0 fL   MCH 31.8 26.0 - 34.0 pg   MCHC 31.5 30.0 - 36.0 g/dL   RDW 13.7 11.5 - 15.5 %   Platelets 297 150 - 400 K/uL   nRBC 0.0 0.0 - 0.2 %   Neutrophils Relative % 73 %   Neutro Abs 6.8 1.7 - 7.7 K/uL    Lymphocytes Relative 14 %   Lymphs Abs 1.3 0.7 - 4.0 K/uL   Monocytes Relative 8 %   Monocytes Absolute 0.8 0.1 - 1.0 K/uL   Eosinophils Relative 5 %   Eosinophils Absolute 0.5 0.0 - 0.5 K/uL   Basophils Relative 0 %   Basophils Absolute 0.0 0.0 - 0.1 K/uL   Immature Granulocytes 0 %   Abs Immature Granulocytes 0.04 0.00 - 0.07 K/uL    Comment: Performed at [facility] [address], [place], [place] [address]  Brain natriuretic peptide     Status: None   Collection Time: 12/06/20  1:44 AM  Result Value Ref Range   B Natriuretic Peptide 79.8 0.0 - 100.0 pg/mL    Comment: Performed at [facility] [address] [place], [place] [address]   No results found.    Assessment/Plan Proximal descending colon mass - Colonoscopy 9/7 with likely malignant 5 cm tumor in the proximal descending colon - follow biopsy pathology, CEA pending - will get imaging with CT chest/abd/pelvis with IV contrast. Given his renal function will discuss with patient associated risks of IV contrast prior to proceeding - INR is 1.2 with coumadin held. Hgb is downtrending to 8.2 (9.5). continue to monitor - continue clear liquids for now  Given worsening symptomatic anemia patient may require surgery this admission as early as tomorrow. Will discuss with MD  FEN: clears ID: none VTE: hold coumadin, okay for heparin gtt from our standpoint  Atrial fibrillation - coumadin HTN [place], [facility] 12/06/2020, 9:20 AM Please see Amion for pager number during day hours 7:00am-4:30pm

## 2020-12-06 NOTE — Progress Notes (Signed)
PROGRESS NOTE                                                                                                                                                                                                             Patient Demographics:    Bradley Stokes, is a 75 y.o. male, DOB - 05-16-1945, FXT:[id number]  Outpatient Primary MD for the patient is [name], [name], MD    LOS - 3  Admit date - 12/02/2020    Chief Complaint  Patient presents with   Rectal Bleeding       Brief Narrative (HPI from H&P)     Bradley Stokes is a 75 y.o. male with medical history significant of hypertension, hyperlipidemia, paroxysmal atrial fibrillation on Coumadin, osteoarthritis, and obesity who presents with complaints of bleeding rectally, he was noted to have INR of 2.9, H&H was stable he was admitted to the hospital for further blood per rectum work-up.  Patient went for endoscopy/colonoscopy 9/7, which was significant for malignant 5 cm tumor in the proximal descending colon which was biopsied.  Significant events -9/7 EGD/colonoscopy   Subjective:   Patient in bed, appears comfortable, denies any headache, no fever, no chest pain or pressure, no shortness of breath , he is tolerating clear liquid diet.  Denies any blood with bowel movements currently.       Assessment  & Plan :     Acute lower GI bleed/acute blood loss anemia due to colonic mass.  . -Patient on warfarin, which has been held, he received vitamin K as well,  INR has normalized now.   -Continue to monitor H&H closely, so far no indication for transfusion.   -On IV Protonix, will switch to p.o.  Colonic mass -Status post EGD/colonoscopy 9/7 significant for 5 cm colon mass in proximal descending colon. -Highly suspicious for malignancy, biopsies are pending. -CEA is pending. -General surgery consulted as likely will need resection, but will await further  recommendation regarding imaging as CT abdomen/pelvis with contrast is unlikely here due baseline renal insufficiency with GFR<30, will discuss with general surgery if MRI abdomen and CT chest without contrast will be sufficient for staging.  AKI on CKD 3B  - Baseline creatinine close to 2, peaked at 2.4, improving with holding Lasix, and gentle IV hydration, will continue with IV fluids to optimize renal  function before imaging.    Paroxysmal A. fib with CHA2DS2-VASc 2 score of greater than 3.  Coumadin held due to above.  Continue amiodarone for rate control.  Morbid obesity.  BMI of 41.  Follow with PCP for weight loss.  Chronic diastolic CHF last known EF is 55% in 2016.  Has a chronic leg edema continue Lasix held for bowel prep and mild AKI. Added TED stockings.  Dyslipidemia - statin.           Condition -  Guarded  Family Communication  :  None present at bedside , patient NOK , his sister is hospitalized as well.  Code Status :  Full  Consults  :  GI, General surgery  PUD Prophylaxis : PPI   Procedures  :            Disposition Plan  :    Status is: Inpatient  Remains inpatient appropriate because:IV treatments appropriate due to intensity of illness or inability to take PO  Dispo: The patient is from: Home              Anticipated d/c is to: Home              Patient currently is not medically stable to d/c.   Difficult to place patient No  DVT Prophylaxis  :    Place TED hose Start: 12/03/20 1131 SCDs Start: 12/02/20 1125    Lab Results  Component Value Date   PLT 297 12/06/2020    Diet :  Diet Order             Diet clear liquid Room service appropriate? Yes; Fluid consistency: Thin  Diet effective now                    Inpatient Medications  Scheduled Meds:  allopurinol  100 mg Oral Daily   amiodarone  100 mg Oral Daily   ammonium lactate  1 application Topical BID   atorvastatin  20 mg Oral Daily   calcitRIOL  0.5 mcg Oral  Daily   pantoprazole (PROTONIX) IV  40 mg Intravenous Q12H   phosphorus  250 mg Oral BID   potassium chloride SA  20 mEq Oral Daily   sodium chloride flush  3 mL Intravenous Q12H   Continuous Infusions:  sodium chloride 75 mL/hr at 12/06/20 0830    PRN Meds:.acetaminophen **OR** acetaminophen, albuterol  Antibiotics  :    Anti-infectives (From admission, onward)    None          Phillips Climes M.D on 12/06/2020 at 12:11 PM  To page go to [email or web address]   [facility] -  Office  [phone]    See all Orders from today for further details    Objective:   Vitals:   12/06/20 0830 12/06/20 0831 12/06/20 0955 12/06/20 1205  BP: (!) 157/65 (!) 157/65  (!) 142/59  Pulse: 84 85  80  Resp: 16 18  18   Temp:  98.3 F (36.8 C) 98.3 F (36.8 C) 97.7 F (36.5 C)  TempSrc:  Oral Oral Oral  SpO2: 99% 98%  98%  Weight:        Wt Readings from Last 3 Encounters:  12/03/20 127.4 kg  06/12/20 130.2 kg  12/21/19 135.5 kg     Intake/Output Summary (Last 24 hours) at 12/06/2020 1211 Last data filed at 12/06/2020 0830 Gross per 24 hour  Intake 1067.61 ml  Output 455 ml  Net 612.61 ml  Physical Exam  Awake Alert, Oriented X 3, No new F.N deficits, Normal affect Symmetrical Chest wall movement, Good air movement bilaterally, CTAB RRR,No Gallops,Rubs or new Murmurs, No Parasternal Heave +ve B.Sounds, Abd Soft, No tenderness, No rebound - guarding or rigidity. 2+ leg edema with chronic discoloration due to venous stasis.   Data Review:    CBC Recent Labs  Lab 12/02/20 0911 12/02/20 1212 12/03/20 0750 12/03/20 1419 12/04/20 0202 12/05/20 0033 12/06/20 0144  WBC 7.0   < > 8.0 9.9 8.8 12.4* 9.4  HGB 9.7*   < > 9.1* 9.7* 8.8* 9.5* 8.2*  HCT 31.7*   < > 29.1* 30.3* 27.9* 30.2* 26.0*  PLT 340   < > 307 357 296 343 297  MCV 101.6*   < > 100.7* 98.7 99.3 100.3* 100.8*  MCH 31.1   < > 31.5 31.6 31.3 31.6 31.8  MCHC 30.6   < > 31.3 32.0 31.5 31.5 31.5  RDW  13.8   < > 13.9 13.7 13.8 13.6 13.7  LYMPHSABS 1.3  --   --   --  1.9 1.5 1.3  MONOABS 0.5  --   --   --  1.0 1.2* 0.8  EOSABS 0.1  --   --   --  0.3 0.2 0.5  BASOSABS 0.0  --   --   --  0.1 0.1 0.0   < > = values in this interval not displayed.    Recent Labs  Lab 12/02/20 0911 12/03/20 0056 12/04/20 0202 12/05/20 0033 12/06/20 0144  NA 141 140 140 138 140  K 3.5 3.7 3.5 3.8 3.7  CL 106 107 104 103 106  CO2 26 24 26 26 25   GLUCOSE 104* 85 86 95 82  BUN 16 18 20 20 19   CREATININE 2.07* 2.13* 2.38* 2.26* 2.10*  CALCIUM 8.8* 8.5* 8.5* 9.1 8.6*  AST 14*  --  15 21 21   ALT 6  --  6 8 8   ALKPHOS 108  --  99 104 93  BILITOT 1.0  --  1.5* 1.7* 1.3*  ALBUMIN 3.1*  --  3.0* 3.3* 2.8*  MG  --   --  2.1 2.2 2.1  INR 2.9* 2.6* 2.3* 1.4* 1.2  BNP  --   --  23.2 23.7 79.8    ------------------------------------------------------------------------------------------------------------------ No results for input(s): CHOL, HDL, LDLCALC, TRIG, CHOLHDL, LDLDIRECT in the last 72 hours.  No results found for: HGBA1C ------------------------------------------------------------------------------------------------------------------ No results for input(s): TSH, T4TOTAL, T3FREE, THYROIDAB in the last 72 hours.  Invalid input(s): FREET3  Cardiac Enzymes No results for input(s): CKMB, TROPONINI, MYOGLOBIN in the last 168 hours.  Invalid input(s): CK ------------------------------------------------------------------------------------------------------------------    Component Value Date/Time   BNP 79.8 12/06/2020 0144     Radiology Reports DG CHEST PORT 1 VIEW  Result Date: 12/02/2020 CLINICAL DATA:  Chronic atrial fibrillation. Reported history of rectal bleeding. EXAM: PORTABLE CHEST 1 VIEW COMPARISON:  Chest radiograph dated 07/10/2016. FINDINGS: The heart size is enlarged. Vascular calcifications are seen in the aortic arch. The left costophrenic angle is obscured which may reflect  pericardial fat versus a pleural effusion. The right lung is clear and there is no right pleural effusion. There is no pneumothorax. The visualized skeletal structures are unremarkable. IMPRESSION: Cardiomegaly. Obscured left costophrenic angle may represent pericardial fat versus a pleural effusion. Aortic Atherosclerosis (ICD10-I70.0). Electronically Signed   By: Zerita Boers M.D.   On: 12/02/2020 15:11

## 2020-12-06 NOTE — Progress Notes (Signed)
Please see consult note from earlier today for full details.  Briefly this is a patient with a proximal descending colon mass that was found on colonoscopy on 9/7.  Biopsy pending.  CEA pending.  My attending is recommending CT C/A/P with IV contrast to rule out metastasis.  Will plan for laparoscopic assisted versus open partial colectomy with possible ostomy creation tomorrow.  Patient is noted to have elevated creatinine of 2.1 with GFR of 32.  Our team discussed with CT who recommended obtaining informed consent in order to be able to proceed with CT C/A/P with IV contrast.  We discussed with the patient the risks of intravenous contrast during CT scan in the setting of a low GFR. Risks included but were not limited to worsening kidney injury and possible kidney failure that could be permanent/irreversible.  We discussed the risks of kidney failure could lead to the need for lifelong dialysis.  We also discussed that kidney failure could lead to abnormal electrolytes leading to abnormal arrhythmias as well as other medical complications that could lead to death.  The patient stated understanding of this.  Informed consent was obtained in written fashion.  The patient's nurse as well as 2 nursing students were present for this conversation.   The planned procedure (laparoscopic assisted versus open partial colectomy with possible ostomy creation) and risks are discussed with the patient.  Risk include but are not limited to anesthesia (MI, CVA, death, prolonged intubation, aspiration), bleeding, pain, infection, scarring, wound issues, hernia, damage surrounding structures (blood vessels/nerves/viscus/organs/ureter), anastomotic leak, and increased risk for DVT/PE.  We also discussed the possible need for creation of a ostomy.  I also discussed typical postoperative expectations and the recovery process.  Patient reports that he lives at home with his sister and usually uses a cane at baseline.  Discussed he  may require a SNF after discharge. Will plan to have therapies work with him after surgery. The patient's questions were answered to their satisfaction, they voiced understanding and elected to proceed with surgery. Informed consent was obtained. The patient's nurse as well as 2 nursing students were present for this conversation.  AM labs written for. Colorectal ERAS protocol.   Alferd Apa, [facility] 12/06/2020, 2:35 PM

## 2020-12-06 NOTE — Anesthesia Postprocedure Evaluation (Signed)
Anesthesia Post Note  Patient: KONSTANTINOS CORDOBA  Procedure(s) Performed: COLONOSCOPY ESOPHAGOGASTRODUODENOSCOPY (EGD) WITH PROPOFOL POLYPECTOMY BIOPSY SUBMUCOSAL TATTOO INJECTION     Patient location during evaluation: Endoscopy Anesthesia Type: MAC Level of consciousness: awake and alert Pain management: pain level controlled Vital Signs Assessment: post-procedure vital signs reviewed and stable Respiratory status: spontaneous breathing, nonlabored ventilation, respiratory function stable and patient connected to nasal cannula oxygen Cardiovascular status: stable and blood pressure returned to baseline Postop Assessment: no apparent nausea or vomiting Anesthetic complications: no   No notable events documented.  Last Vitals:  Vitals:   12/06/20 1602 12/06/20 1948  BP: (!) 134/50 (!) 146/58  Pulse: 72 85  Resp: 18 18  Temp: 36.5 C 36.5 C  SpO2: 97% 93%    Last Pain:  Vitals:   12/06/20 2144  TempSrc:   PainSc: 3                  Damante Spragg

## 2020-12-06 NOTE — TOC Progression Note (Signed)
Transition of Care [facility]) - Progression Note    Patient Details  Name: Bradley Stokes MRN: [id number] Date of Birth: May 21, 1945  Transition of [facility]) CM/SW [place], LCSW Phone Number: 12/06/2020, 8:36 AM  Clinical Narrative:    11amCSW and RNCM spoke with patient at bedside to discuss discharge plan again. Patient again stated he would only be willing to go to SNF if his sister also goes. CSW coordinated with RNCM/CSW on 54M to have physical therapy see his sister again as they are currently recommending home health for her.   2pm-Patient's sister walked even further upon the updated PT session. CSW is concerned that she will not get approval from insurance for SNF, but will try. CSW discussed case with SNFs and from available offers, [facility] is able to accept both patients if authorization is received.    9/8: Patients are not managed by [facility], so CSW has requested [employer] begin insurance authorization for both.      Barriers to Discharge: Ship broker, Continued Medical Work up  [employer] and Services   In-house Referral: Clinical Social Work     Living arrangements for the past 2 months: Single Family Home                                       Social Determinants of Health (SDOH) Interventions    Readmission Risk Interventions No flowsheet data found.

## 2020-12-07 ENCOUNTER — Encounter (HOSPITAL_COMMUNITY)
Admission: EM | Disposition: A | Discharge status: Skilled Nursing Facility | Payer: Self-pay | Source: Home / Self Care | Attending: Internal Medicine

## 2020-12-07 ENCOUNTER — Encounter (HOSPITAL_COMMUNITY): Payer: Self-pay | Admitting: Gastroenterology

## 2020-12-07 DIAGNOSIS — Z66 Do not resuscitate: Secondary | ICD-10-CM

## 2020-12-07 DIAGNOSIS — Z7189 Other specified counseling: Secondary | ICD-10-CM

## 2020-12-07 DIAGNOSIS — Z515 Encounter for palliative care: Secondary | ICD-10-CM

## 2020-12-07 LAB — COMPREHENSIVE METABOLIC PANEL
ALT: 9 U/L (ref 0–44)
AST: 17 U/L (ref 15–41)
Albumin: 2.7 g/dL — ABNORMAL LOW (ref 3.5–5.0)
Alkaline Phosphatase: 101 U/L (ref 38–126)
Anion gap: 6 (ref 5–15)
BUN: 14 mg/dL (ref 8–23)
CO2: 26 mmol/L (ref 22–32)
Calcium: 8.5 mg/dL — ABNORMAL LOW (ref 8.9–10.3)
Chloride: 105 mmol/L (ref 98–111)
Creatinine, Ser: 1.95 mg/dL — ABNORMAL HIGH (ref 0.61–1.24)
GFR, Estimated: 35 mL/min — ABNORMAL LOW (ref >60)
Glucose, Bld: 113 mg/dL — ABNORMAL HIGH (ref 70–99)
Potassium: 3.8 mmol/L (ref 3.5–5.1)
Sodium: 137 mmol/L (ref 135–145)
Total Bilirubin: 1.2 mg/dL (ref 0.3–1.2)
Total Protein: 5.7 g/dL — ABNORMAL LOW (ref 6.5–8.1)

## 2020-12-07 LAB — CBC WITH DIFFERENTIAL/PLATELET
Abs Immature Granulocytes: 0.05 10*3/uL (ref 0.00–0.07)
Basophils Absolute: 0 10*3/uL (ref 0.0–0.1)
Basophils Relative: 0 %
Eosinophils Absolute: 0.3 10*3/uL (ref 0.0–0.5)
Eosinophils Relative: 3 %
HCT: 27.1 % — ABNORMAL LOW (ref 39.0–52.0)
Hemoglobin: 8.4 g/dL — ABNORMAL LOW (ref 13.0–17.0)
Immature Granulocytes: 1 %
Lymphocytes Relative: 11 %
Lymphs Abs: 1.1 10*3/uL (ref 0.7–4.0)
MCH: 31.5 pg (ref 26.0–34.0)
MCHC: 31 g/dL (ref 30.0–36.0)
MCV: 101.5 fL — ABNORMAL HIGH (ref 80.0–100.0)
Monocytes Absolute: 1.1 10*3/uL — ABNORMAL HIGH (ref 0.1–1.0)
Monocytes Relative: 11 %
Neutro Abs: 7.3 10*3/uL (ref 1.7–7.7)
Neutrophils Relative %: 74 %
Platelets: 302 10*3/uL (ref 150–400)
RBC: 2.67 MIL/uL — ABNORMAL LOW (ref 4.22–5.81)
RDW: 14 % (ref 11.5–15.5)
WBC: 9.9 10*3/uL (ref 4.0–10.5)
nRBC: 0 % (ref 0.0–0.2)

## 2020-12-07 LAB — PROTIME-INR
INR: 1.2 (ref 0.8–1.2)
Prothrombin Time: 14.7 seconds (ref 11.4–15.2)

## 2020-12-07 LAB — BRAIN NATRIURETIC PEPTIDE: B Natriuretic Peptide: 80.7 pg/mL (ref 0.0–100.0)

## 2020-12-07 LAB — MAGNESIUM: Magnesium: 2 mg/dL (ref 1.7–2.4)

## 2020-12-07 SURGERY — COLECTOMY, PARTIAL
Anesthesia: General

## 2020-12-07 MED ORDER — SODIUM CHLORIDE 0.9 % IV SOLN
2.0000 g | INTRAVENOUS | Status: AC
  Filled 2020-12-07: qty 2

## 2020-12-07 NOTE — Progress Notes (Signed)
PROGRESS NOTE                                                                                                                                                                                                             Patient Demographics:    Bradley Stokes, is a 75 y.o. male, DOB - 1945-05-01, EXN:[id number]  Outpatient Primary MD for the patient is [name], [name], MD    LOS - 4  Admit date - 12/02/2020    Chief Complaint  Patient presents with   Rectal Bleeding       Brief Narrative (HPI from H&P)     Bradley Stokes is a 75 y.o. male with medical history significant of hypertension, hyperlipidemia, paroxysmal atrial fibrillation on Coumadin, osteoarthritis, and obesity who presents with complaints of bleeding rectally, he was noted to have INR of 2.9, H&H was stable he was admitted to the hospital for further blood per rectum work-up.  Patient went for endoscopy/colonoscopy 9/7, which was significant for malignant 5 cm tumor in the proximal descending colon which was biopsied.  Significant events -9/7 EGD/colonoscopy   Subjective:   Patient denies any complaints today, reports he is ready for surgery,   Assessment  & Plan :     Acute lower GI bleed/acute blood loss anemia due to colonic mass.  . -Patient on warfarin, which has been held, he received vitamin K as well,  INR has normalized now.   -Continue to monitor H&H closely, so far no indication for transfusion.   -On IV Protonix, will switch to p.o.  Colonic mass -Status post EGD/colonoscopy 9/7 significant for 5 cm colon mass in proximal descending colon. -Biopsy significant for invasive moderately differentiated adenocarcinoma. -CEA is normal 3.4 -General surgery consulted , plan for surgery today.   -CT abdomen pelvis significant for 2 subcentimeter hypodense hepatic lesion, characterized, and single 1.3 cm lymph node posterior to colonic lesion,  likely local nodal involvement, no additional pathology to the enlarged abdominal or pelvic lymph nodes.  AKI on CKD 3B  - Baseline creatinine close to 2, peaked at 2.4, improving with holding Lasix, and gentle IV hydration.  Paroxysmal A. fib with CHA2DS2-VASc 2 score of greater than 3.  Continue amiodarone for rate control. -Continue to hold warfarin, anticoagulation secondary to bleeding colon mass.  Morbid obesity.  BMI of 41.  Follow with PCP for weight loss.  Chronic diastolic CHF last known EF is 55% in 2016.  Has a chronic leg edema continue Lasix held for bowel prep and mild AKI. Added TED stockings.  Dyslipidemia - statin.           Condition -  Guarded  Family Communication  :  None present at bedside , patient NOK , his sister is hospitalized as well.  Code Status :  Full  Consults  :  GI, General surgery  PUD Prophylaxis : PPI   Procedures  :            Disposition Plan  :    Status is: Inpatient  Remains inpatient appropriate because:IV treatments appropriate due to intensity of illness or inability to take PO  Dispo: The patient is from: Home              Anticipated d/c is to: Home              Patient currently is not medically stable to d/c.   Difficult to place patient No  DVT Prophylaxis  :    heparin injection 5,000 Units Start: 12/06/20 1530 SCD's Start: 12/06/20 1437 Place TED hose Start: 12/03/20 1131 SCDs Start: 12/02/20 1125    Lab Results  Component Value Date   PLT 302 12/07/2020    Diet :  Diet Order             Diet NPO time specified Except for: Ice Chips, Sips with Meds  Diet effective ____                    Inpatient Medications  Scheduled Meds:  acetaminophen  1,000 mg Oral On Call to OR   allopurinol  100 mg Oral Daily   amiodarone  100 mg Oral Daily   ammonium lactate  1 application Topical BID   atorvastatin  20 mg Oral Daily   bupivacaine liposome  20 mL Infiltration Once   calcitRIOL  0.5 mcg  Oral Daily   heparin  5,000 Units Subcutaneous Once   neomycin  1,000 mg Oral 3 times per day   And   metroNIDAZOLE  1,000 mg Oral 3 times per day   pantoprazole  40 mg Oral Daily   phosphorus  250 mg Oral BID   potassium chloride SA  20 mEq Oral Daily   sodium chloride flush  3 mL Intravenous Q12H   Continuous Infusions:  sodium chloride 75 mL/hr at 12/07/20 1106   cefoTEtan (CEFOTAN) IV      PRN Meds:.acetaminophen **OR** acetaminophen, albuterol  Antibiotics  :    Anti-infectives (From admission, onward)    Start     Dose/Rate Route Frequency Ordered Stop   12/07/20 1445  cefoTEtan (CEFOTAN) 2 g in sodium chloride 0.9 % 100 mL IVPB        2 g 200 mL/hr over 30 Minutes Intravenous On call to O.R. 12/07/20 1355 12/08/20 0559   12/07/20 0600  cefoTEtan (CEFOTAN) 2 g in sodium chloride 0.9 % 100 mL IVPB  Status:  Discontinued        2 g 200 mL/hr over 30 Minutes Intravenous On call to O.R. 12/06/20 1436 12/07/20 1355   12/06/20 2200  neomycin (MYCIFRADIN) tablet 1,000 mg       See Hyperspace for full Linked Orders Report.   1,000 mg Oral 3 times per day 12/06/20 1436 12/07/20 2159   12/06/20 2200  metroNIDAZOLE (FLAGYL) tablet 1,000  mg       See Hyperspace for full Linked Orders Report.   1,000 mg Oral 3 times per day 12/06/20 1436 12/07/20 2159          Emeline Gins Darlis Wragg M.D on 12/07/2020 at 2:16 PM  To page go to [email or web address]   [facility] -  Office  [phone]    See all Orders from today for further details    Objective:   Vitals:   12/07/20 0347 12/07/20 0500 12/07/20 0620 12/07/20 0748  BP: (!) 159/55   (!) 153/56  Pulse: 86   68  Resp: 18     Temp: 98.4 F (36.9 C)   97.7 F (36.5 C)  TempSrc: Oral   Oral  SpO2: 97%     Weight:  130.4 kg 130.6 kg   Height:        Wt Readings from Last 3 Encounters:  12/07/20 130.6 kg  06/12/20 130.2 kg  12/21/19 135.5 kg     Intake/Output Summary (Last 24 hours) at 12/07/2020 1416 Last data filed  at 12/07/2020 0400 Gross per 24 hour  Intake 1534.91 ml  Output 350 ml  Net 1184.91 ml     Physical Exam  Awake Alert, Oriented X 3, No new F.N deficits, Normal affect Symmetrical Chest wall movement, Good air movement bilaterally, CTAB RRR,No Gallops,Rubs or new Murmurs, No Parasternal Heave +ve B.Sounds, Abd Soft, No tenderness, No rebound - guarding or rigidity. Chronic lower ext edema with chronic discoloration .          Data Review:    CBC Recent Labs  Lab 12/02/20 0911 12/02/20 1212 12/03/20 1419 12/04/20 0202 12/05/20 0033 12/06/20 0144 12/07/20 0255  WBC 7.0   < > 9.9 8.8 12.4* 9.4 9.9  HGB 9.7*   < > 9.7* 8.8* 9.5* 8.2* 8.4*  HCT 31.7*   < > 30.3* 27.9* 30.2* 26.0* 27.1*  PLT 340   < > 357 296 343 297 302  MCV 101.6*   < > 98.7 99.3 100.3* 100.8* 101.5*  MCH 31.1   < > 31.6 31.3 31.6 31.8 31.5  MCHC 30.6   < > 32.0 31.5 31.5 31.5 31.0  RDW 13.8   < > 13.7 13.8 13.6 13.7 14.0  LYMPHSABS 1.3  --   --  1.9 1.5 1.3 1.1  MONOABS 0.5  --   --  1.0 1.2* 0.8 1.1*  EOSABS 0.1  --   --  0.3 0.2 0.5 0.3  BASOSABS 0.0  --   --  0.1 0.1 0.0 0.0   < > = values in this interval not displayed.    Recent Labs  Lab 12/02/20 0911 12/03/20 0056 12/04/20 0202 12/05/20 0033 12/06/20 0144 12/07/20 0255  NA 141 140 140 138 140 137  K 3.5 3.7 3.5 3.8 3.7 3.8  CL 106 107 104 103 106 105  CO2 26 24 26 26 25 26   GLUCOSE 104* 85 86 95 82 113*  BUN 16 18 20 20 19 14   CREATININE 2.07* 2.13* 2.38* 2.26* 2.10* 1.95*  CALCIUM 8.8* 8.5* 8.5* 9.1 8.6* 8.5*  AST 14*  --  15 21 21 17   ALT 6  --  6 8 8 9   ALKPHOS 108  --  99 104 93 101  BILITOT 1.0  --  1.5* 1.7* 1.3* 1.2  ALBUMIN 3.1*  --  3.0* 3.3* 2.8* 2.7*  MG  --   --  2.1 2.2 2.1 2.0  INR 2.9* 2.6* 2.3* 1.4* 1.2  1.2  BNP  --   --  23.2 23.7 79.8 80.7    ------------------------------------------------------------------------------------------------------------------ No results for input(s): CHOL, HDL, LDLCALC,  TRIG, CHOLHDL, LDLDIRECT in the last 72 hours.  No results found for: HGBA1C ------------------------------------------------------------------------------------------------------------------ No results for input(s): TSH, T4TOTAL, T3FREE, THYROIDAB in the last 72 hours.  Invalid input(s): FREET3  Cardiac Enzymes No results for input(s): CKMB, TROPONINI, MYOGLOBIN in the last 168 hours.  Invalid input(s): CK ------------------------------------------------------------------------------------------------------------------    Component Value Date/Time   BNP 80.7 12/07/2020 0255     Radiology Reports CT CHEST ABDOMEN PELVIS W CONTRAST  Result Date: 12/06/2020 CLINICAL DATA:  Colon cancer, initial staging. EXAM: CT CHEST, ABDOMEN, AND PELVIS WITH CONTRAST TECHNIQUE: Multidetector CT imaging of the chest, abdomen and pelvis was performed following the standard protocol during bolus administration of intravenous contrast. CONTRAST:  86mL OMNIPAQUE IOHEXOL 350 MG/ML SOLN COMPARISON:  No imaging available for comparison at time of dictation. FINDINGS: CT CHEST FINDINGS Cardiovascular: Aortic and branch vessel atherosclerosis without aneurysmal dilation. Three-vessel coronary artery calcifications. Normal size heart. No significant pericardial effusion/thickening. No central pulmonary embolus. Mediastinum/Nodes: No supraclavicular adenopathy. No discrete thyroid nodule. No pathologically enlarged mediastinal, hilar or axillary lymph nodes. The trachea and esophagus are unremarkable. Lungs/Pleura: No suspicious pulmonary nodules or masses. Bibasilar atelectasis. No pleural effusion. No pneumothorax. Musculoskeletal: Healing left lateral eighth and ninth rib fractures, without associated lesion to suggest pathologic fracture. No aggressive lytic or blastic lesion of bone. CT ABDOMEN PELVIS FINDINGS Hepatobiliary: Hypodense 7 mm lesion in the left lobe of the liver on image 48/3 and a hypodense 4 mm lesion  in the right lobe of the liver on image 49/3, incompletely characterized on this examination. Gallbladder is unremarkable. No biliary ductal dilation. Pancreas: No pancreatic ductal dilatation or surrounding inflammatory changes. Spleen: Within normal limits. Adrenals/Urinary Tract: Bilateral adrenal glands are unremarkable. No hydronephrosis. Bilateral renal cortical thinning. Urinary bladder is unremarkable for degree of distension. Stomach/Bowel: Stomach is decompressed. Periampullary duodenal diverticulum. No pathologic dilation of small or large bowel. Radiopaque enteric contrast traverses the sigmoid colon. Short segment of colonic wall thickening involving the splenic flexure for instance on image 64/3 and 28/4 likely reflects patient's presumed colonic malignancy. Left-sided colonic diverticulosis without findings of acute diverticulitis. Vascular/Lymphatic: Aorta bi-iliac atherosclerotic calcifications without abdominal aortic aneurysm. 1.3 cm lymph node posterior to the colonic lesion on image 61/3 likely reflecting local nodal involvement. No additional pathologically enlarged abdominal or pelvic lymph nodes. Reproductive: Prostate is unremarkable. Other: No significant abdominopelvic ascites. Nonspecific body wall edema. Musculoskeletal: Multilevel degenerative changes spine. No aggressive lytic or blastic lesion of bone. IMPRESSION: 1. Short segment of colonic wall thickening involving the splenic flexure likely reflects patient's presumed colonic malignancy. 2. Single 1.3 cm lymph node posterior to the colonic lesion likely reflecting local nodal involvement. No additional pathologically enlarged abdominal or pelvic lymph nodes to suggest distant nodal metastases. 3. Two subcentimeter hypodense hepatic lesions, incompletely characterized on this examination. Possibly reflecting benign hepatic cysts but cystic hepatic metastases not excluded on this study. Consider attention on close interval follow-up  imaging versus further evaluation with hepatic protocol MRI with and without contrast, preferably as an outpatient when patient is stable and able to follow commands. 4. Healing nonpathologic left lateral eighth and ninth rib fractures. 5. Aortic Atherosclerosis (ICD10-I70.0). Electronically Signed   By: Dahlia Bailiff M.D.   On: 12/06/2020 20:56   DG CHEST PORT 1 VIEW  Result Date: 12/02/2020 CLINICAL DATA:  Chronic atrial fibrillation. Reported history of rectal  bleeding. EXAM: PORTABLE CHEST 1 VIEW COMPARISON:  Chest radiograph dated 07/10/2016. FINDINGS: The heart size is enlarged. Vascular calcifications are seen in the aortic arch. The left costophrenic angle is obscured which may reflect pericardial fat versus a pleural effusion. The right lung is clear and there is no right pleural effusion. There is no pneumothorax. The visualized skeletal structures are unremarkable. IMPRESSION: Cardiomegaly. Obscured left costophrenic angle may represent pericardial fat versus a pleural effusion. Aortic Atherosclerosis (ICD10-I70.0). Electronically Signed   By: Zerita Boers M.D.   On: 12/02/2020 15:11

## 2020-12-07 NOTE — Anesthesia Preprocedure Evaluation (Deleted)
Anesthesia Evaluation    Reviewed: Allergy & Precautions, Patient's Chart, lab work & pertinent test results  History of Anesthesia Complications Negative for: history of anesthetic complications  Airway        Dental   Pulmonary neg pulmonary ROS,           Cardiovascular hypertension, + dysrhythmias Atrial Fibrillation      Neuro/Psych negative neurological ROS  negative psych ROS   GI/Hepatic negative GI ROS, Neg liver ROS,   Endo/Other  Morbid obesity  Renal/GU CRFRenal disease     Musculoskeletal  (+) Arthritis , Osteoarthritis,   Gout    Abdominal   Peds  Hematology  (+) anemia ,  On coumadin    Anesthesia Other Findings   Reproductive/Obstetrics                             Anesthesia Physical Anesthesia Plan  ASA: 3  Anesthesia Plan: General   Post-op Pain Management:    Induction: Intravenous  PONV Risk Score and Plan: 2 and Treatment may vary due to age or medical condition, Ondansetron and Dexamethasone  Airway Management Planned: Oral ETT  Additional Equipment: None  Intra-op Plan:   Post-operative Plan: Extubation in OR  Informed Consent:   Patient has DNR.     Plan Discussed with: CRNA and Anesthesiologist  Anesthesia Plan Comments:         Anesthesia Quick Evaluation

## 2020-12-07 NOTE — Progress Notes (Signed)
This chaplain is present with the Pt., notary and two witnesses for the signing of the Pt. Advance Directive:  HCPOA and Living Will.  The chaplain gave the Pt. the original AD document and two copies.  A copy of the Pt. AD was scanned into the Pt. EMR.  This chaplain is available for F/U spiritual care as needed.  Chaplain Sallyanne Kuster Pager [phone]

## 2020-12-07 NOTE — Consult Note (Signed)
[facility] Nurse requested for preoperative stoma site marking  Discussed surgical procedure and stoma creation with patient and family.  Explained role of the [facility] nurse team.  Provided the patient with educational booklet and provided samples of pouching options.  Answered patient questions.   Examined patient lying, sitting in order to place the marking in the patient's visual field, away from any creases or abdominal contour issues and within the rectus muscle.  Attempted to mark below the patient's belt line.   Marked for colostomy in the LUQ  __5__ cm to the left of the umbilicus and __2__LN above the umbilicus.  Patient's abdomen cleansed with CHG wipes at site markings, allowed to air dry prior to marking.Covered mark with thin film transparent dressing to preserve mark until date of surgery.   Val Riles, RN, MSN, CWOCN, CNS-BC, pager [phone]

## 2020-12-07 NOTE — Consult Note (Signed)
Palliative Medicine Inpatient Consult Note  Consulting Provider: Jesusita Oka, MD  Reason for consult:   [facility] Palliative Medicine Consult  Reason for Consult?    HPI:  Per intake H&P --> Bradley Stokes is a 75 y.o. male with medical history significant of hypertension, hyperlipidemia, paroxysmal atrial fibrillation on Coumadin, osteoarthritis, and obesity who presents with complaints of bleeding rectally, he was noted to have INR of 2.9, H&H was stable he was admitted to the hospital for further blood per rectum work-up.  Patient went for endoscopy/colonoscopy 9/7, which was significant for malignant 5 cm tumor in the proximal descending colon which was biopsied.  Clinical Assessment/Goals of Care:  *Please note that this is a verbal dictation therefore any spelling or grammatical errors are due to the "[place]" system interpretation.  I have reviewed medical records including EPIC notes, labs and imaging, received report from bedside RN, assessed the patient who is lying in bed in no acute distress.    I met with Bradley Stokes to further discuss diagnosis prognosis, GOC, EOL wishes, disposition and options.  We reviewed Bradley Stokes's co-morbid medical conditions inclusive of: pAfib, OA of his bilateral knees, CKD III, diastolic heart failure. Bradley Stokes shares awareness of his reason for hospitalization inclusive of GI bleeding and newly identified colonic lesion. He reviews the plan for surgery today possibly leading to a long term ostomy.    I introduced Palliative Medicine as specialized medical care for people living with serious illness. It focuses on providing relief from the symptoms and stress of a serious illness. The goal is to improve quality of life for both the patient and the family.  Bradley Stokes shares that he is from, [place], [place]. He has lived here throughout his life and shares that he has only ever gotten to travel to  [place]. He has never been married and has not children. He lives with his sister, Seth Bake who is also in poor health. 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 He expresses getting enjoyment out of speaking with people and learning about their lives. 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   Prior to hospitalization, Bradley Stokes was able to perform basic activities of daily living with minimal assistance. He utilized a walker for mobility aid. He has expressed many concerns regarding he and his sisters poor health and their needs for skilled nursing. He shares that his sister is hospitalized down to hallway with "difficulty breathing".   A detailed discussion was had today regarding advanced directives - Bradley Stokes has never completed these and would be interested in doing so in house. He shares that his sister would be his primary decision maker and his best-friend, Bradley Stokes would be his secondary decision maker.    Concepts specific to code status, artifical feeding and hydration, continued IV antibiotics and rehospitalization was had.  A MOST form was present and completed with the following wishes:  Cardiopulmonary Resuscitation: Do Not Attempt Resuscitation (DNR/No CPR)  Medical Interventions: Limited Additional Interventions: Use medical treatment, IV fluids and cardiac monitoring as indicated, DO NOT USE intubation or mechanical ventilation. May consider use of less invasive airway support such as BiPAP or CPAP. Also provide comfort measures. Transfer to the hospital if indicated. Avoid intensive care.   Antibiotics: Determine use of limitation of antibiotics when infection occurs  IV Fluids: IV fluids for a defined trial period  Feeding  Tube: Feeding tube for a defined trial period   The difference between a aggressive medical intervention path  and a palliative comfort care path for this patient at this  time was had. Bradley Stokes shares, "I have already exceeded the average age for a man." He states that he would like to only pursue interventions that will enable him to maintain his current level of functioning. We reviewed the possibility of metastatic disease and he shares if this were the case he would need to weigh his options of treatment versus no treatment.   Discussed the importance of continued conversation with family and their  medical providers regarding overall plan of care and treatment options, ensuring decisions are within the context of the patients values and GOCs.  Provided  "Hard Choices for Loving People" booklet.   Decision Maker: Andrea Jirak (sister) [phone] Douglas Hedrick (best-friend) [phone]  SUMMARY OF RECOMMENDATIONS   DNAR/DNI  MOST Completed, paper copy placed onto the chart electric copy can be found in [employer]  DNR Form Completed, paper copy placed onto the chart electric copy can be found in [employer]  Appreciate Chaplain aiding in completion of advance directives  Will need to await biopsy to see what further possibly therapies would involve  Plan for surgery today for colon mass  Ongoing conversations will continue as I will request OP Palliative support  Patient would like to be placed in a SF with his sister if possible  Personal goals are to be as independent as possible for as long as possible  Ongoing support while Bradley Stokes is hospitalized  Code Status/Advance Care Planning: DNAR/DNI  Palliative Prophylaxis:  Oral Care, Mobility  Additional Recommendations (Limitations, Scope, Preferences): Continue to treat what is treatable   Psycho-social/Spiritual:  Desire for further Chaplaincy support: Yes Additional Recommendations: Education on potential metastatic disease   Prognosis: Unclear  Discharge Planning: Will require skilled nursing with OP Palliative support.  Vitals:   12/07/20 0020 12/07/20 0347  BP: 140/62 (!) 159/55   Pulse: 86 86  Resp: 18 18  Temp: 97.6 F (36.4 C) 98.4 F (36.9 C)  SpO2: 98% 97%    Intake/Output Summary (Last 24 hours) at 12/07/2020 0716 Last data filed at 12/07/2020 0400 Gross per 24 hour  Intake 1534.91 ml  Output 500 ml  Net 1034.91 ml   Last Weight  Most recent update: 12/07/2020  6:20 AM    Weight  130.6 kg (287 lb 14.7 oz)            Gen: Obese older caucasian M in NAD HEENT: moist mucous membranes CV: Regular rate and irregular rhythm  PULM: On RA ABD: soft/nontender  EXT: (+) LE edema  Neuro: Alert and oriented x3   PPS: 10%   This conversation/these recommendations were discussed with patient primary care team, Dr. Elgergawy  Time In: 0635 Time Out: 0800 Total Time: 85 Greater than 50%  of this time was spent counseling and coordinating care related to the above assessment and plan.    [facility] Palliative Medicine Team Team Cell Phone: [phone] Please utilize secure chat with additional questions, if there is no response within 30 minutes please call the above phone number  Palliative Medicine Team providers are available by phone from 7am to 7pm daily and can be reached through the team cell phone.  Should this patient require assistance outside of these hours, please call the patient's attending physician.   

## 2020-12-07 NOTE — TOC Progression Note (Signed)
Transition of Care [facility]) - Progression Note    Patient Details  Name: Bradley Stokes MRN: [id number] Date of Birth: 09-Dec-1945  [facility] CM/SW [place], RN Phone Number: 12/07/2020, 5:13 PM  Clinical Narrative:     Consult placed for outpatient palliative consult [facility] called about following patient [facility] office) patient lives with his sister currently.   Expected Discharge Plan:  (outpatient palliative) Barriers to Discharge: Ship broker, Continued Medical Work up  Expected Discharge Plan and Services Expected Discharge Plan:  (outpatient palliative) In-house Referral: Clinical Social Work     Living arrangements for the past 2 months: Single Family Home                    Outpatient Palliative care                 Social Determinants of Health (SDOH) Interventions    Readmission Risk Interventions No flowsheet data found.

## 2020-12-07 NOTE — Progress Notes (Addendum)
General Surgery Follow Up Note  Subjective:    Overnight Issues:   Objective:  Vital signs for last 24 hours: Temp:  [97.6 F (36.4 C)-98.4 F (36.9 C)] 97.7 F (36.5 C) (09/09 0748) Pulse Rate:  [68-90] 68 (09/09 0748) Resp:  [18-20] 18 (09/09 0347) BP: (134-159)/(50-62) 153/56 (09/09 0748) SpO2:  [93 %-98 %] 97 % (09/09 0347) Weight:  [127.4 kg-130.6 kg] 130.6 kg (09/09 0620)  Hemodynamic parameters for last 24 hours:    Intake/Output from previous day: 09/08 0701 - 09/09 0700 In: 1534.9 [I.V.:1534.9] Out: 500 [Urine:500]  Intake/Output this shift: No intake/output data recorded.  Vent settings for last 24 hours:    Physical Exam:  Gen: comfortable, no distress Neuro: non-focal exam HEENT: PERRL Neck: supple CV: RRR Pulm: unlabored breathing Abd: soft, NT GU: clear yellow urine Extr: wwp, no edema   Results for orders placed or performed during the hospital encounter of 12/02/20 (from the past 24 hour(s))  Protime-INR     Status: None   Collection Time: 12/07/20  2:55 AM  Result Value Ref Range   Prothrombin Time 14.7 11.4 - 15.2 seconds   INR 1.2 0.8 - 1.2  Magnesium     Status: None   Collection Time: 12/07/20  2:55 AM  Result Value Ref Range   Magnesium 2.0 1.7 - 2.4 mg/dL  Comprehensive metabolic panel     Status: Abnormal   Collection Time: 12/07/20  2:55 AM  Result Value Ref Range   Sodium 137 135 - 145 mmol/L   Potassium 3.8 3.5 - 5.1 mmol/L   Chloride 105 98 - 111 mmol/L   CO2 26 22 - 32 mmol/L   Glucose, Bld 113 (H) 70 - 99 mg/dL   BUN 14 8 - 23 mg/dL   Creatinine, Ser 1.95 (H) 0.61 - 1.24 mg/dL   Calcium 8.5 (L) 8.9 - 10.3 mg/dL   Total Protein 5.7 (L) 6.5 - 8.1 g/dL   Albumin 2.7 (L) 3.5 - 5.0 g/dL   AST 17 15 - 41 U/L   ALT 9 0 - 44 U/L   Alkaline Phosphatase 101 38 - 126 U/L   Total Bilirubin 1.2 0.3 - 1.2 mg/dL   GFR, Estimated 35 (L) >60 mL/min   Anion gap 6 5 - 15  CBC with Differential/Platelet     Status: Abnormal    Collection Time: 12/07/20  2:55 AM  Result Value Ref Range   WBC 9.9 4.0 - 10.5 K/uL   RBC 2.67 (L) 4.22 - 5.81 MIL/uL   Hemoglobin 8.4 (L) 13.0 - 17.0 g/dL   HCT 27.1 (L) 39.0 - 52.0 %   MCV 101.5 (H) 80.0 - 100.0 fL   MCH 31.5 26.0 - 34.0 pg   MCHC 31.0 30.0 - 36.0 g/dL   RDW 14.0 11.5 - 15.5 %   Platelets 302 150 - 400 K/uL   nRBC 0.0 0.0 - 0.2 %   Neutrophils Relative % 74 %   Neutro Abs 7.3 1.7 - 7.7 K/uL   Lymphocytes Relative 11 %   Lymphs Abs 1.1 0.7 - 4.0 K/uL   Monocytes Relative 11 %   Monocytes Absolute 1.1 (H) 0.1 - 1.0 K/uL   Eosinophils Relative 3 %   Eosinophils Absolute 0.3 0.0 - 0.5 K/uL   Basophils Relative 0 %   Basophils Absolute 0.0 0.0 - 0.1 K/uL   Immature Granulocytes 1 %   Abs Immature Granulocytes 0.05 0.00 - 0.07 K/uL  Brain natriuretic peptide  Status: None   Collection Time: 12/07/20  2:55 AM  Result Value Ref Range   B Natriuretic Peptide 80.7 0.0 - 100.0 pg/mL    Assessment & Plan:  Present on Admission:  GI bleed  (HFpEF) heart failure with preserved ejection fraction (HCC)  PAF (paroxysmal atrial fibrillation) (HCC)  Hyperlipidemia  Acute blood loss anemia    LOS: 4 days   Additional comments:I reviewed the patient's new clinical lab test results.   and I reviewed the patients new imaging test results.    Proximal descending colon mass - Colonoscopy 9/7 with likely malignant 5 cm tumor in the proximal descending colon - follow biopsy pathology, CEA pending - imaging with CT chest/abd/pelvis with IV contrast 9/8 without evidence of metastatic disease - INR is 1.2 with coumadin held - s/p ERAS colon prep, NPO for planned surgery today, but due to scheduling may need to be pushed. Discussed with the patient. - patient transition to DNR status this AM per palliative care. Discussed with patient this will be rescinded for the immediate peri-operative period and he verbalizes understanding and is in agreement. He clearly verbalizes that  he would not want to be on life sustaining measures for any extended period of time and also verbalizes that he would not want to live in a facility where he would need significant or total care from others.   FEN: NPO ID: none VTE: hold coumadin, okay for heparin gtt from our standpoint   Atrial fibrillation - coumadin HTN HLD  Jesusita Oka, MD Trauma & General Surgery Please use [email or web address] to contact on call provider  12/07/2020  *Care during the described time interval was provided by me. I have reviewed this patient's available data, including medical history, events of note, physical examination and test results as part of my evaluation.

## 2020-12-07 NOTE — Progress Notes (Addendum)
This chaplain responded to PMT consult for spiritual care and creating/updating the Pt. Advance Directive:  HCPOA and Living Will.  The Pt. completed AD education with the chaplain.  The chaplain understands the Pt. sister is also a patient at [facility].  The Pt. filled out the AD document with the chaplain. The chaplain understands the Pt. has chosen his Bradley Stokes as his healthcare agent and friend- Bradley Stokes if the healthcare agent is unwilling or unable to serve.  The chaplain is requesting a notary and witness to complete the Pt. AD.  The document was left on the Pt. bedside table.  This chaplain is available for F/U spiritual care as needed.  Chaplain Sallyanne Kuster Pager [phone]

## 2020-12-08 ENCOUNTER — Encounter (HOSPITAL_COMMUNITY)
Admission: EM | Disposition: A | Discharge status: Skilled Nursing Facility | Payer: Self-pay | Source: Home / Self Care | Attending: Internal Medicine

## 2020-12-08 ENCOUNTER — Inpatient Hospital Stay (HOSPITAL_COMMUNITY): Payer: Medicare HMO | Admitting: Anesthesiology

## 2020-12-08 ENCOUNTER — Encounter (HOSPITAL_COMMUNITY): Payer: Self-pay | Admitting: Internal Medicine

## 2020-12-08 ENCOUNTER — Other Ambulatory Visit: Payer: Self-pay

## 2020-12-08 HISTORY — PX: LAPAROSCOPIC PARTIAL COLECTOMY: SHX5907

## 2020-12-08 HISTORY — PX: LAPAROSCOPIC LOW ANTERIOR RESCECTION WITH COLOANAL ANASTOMOSIS: SHX5903

## 2020-12-08 LAB — CBC
HCT: 26 % — ABNORMAL LOW (ref 39.0–52.0)
Hemoglobin: 8.1 g/dL — ABNORMAL LOW (ref 13.0–17.0)
MCH: 31.8 pg (ref 26.0–34.0)
MCHC: 31.2 g/dL (ref 30.0–36.0)
MCV: 102 fL — ABNORMAL HIGH (ref 80.0–100.0)
Platelets: 307 10*3/uL (ref 150–400)
RBC: 2.55 MIL/uL — ABNORMAL LOW (ref 4.22–5.81)
RDW: 14.3 % (ref 11.5–15.5)
WBC: 7.6 10*3/uL (ref 4.0–10.5)
nRBC: 0 % (ref 0.0–0.2)

## 2020-12-08 LAB — PROTIME-INR
INR: 1.2 (ref 0.8–1.2)
Prothrombin Time: 14.9 seconds (ref 11.4–15.2)

## 2020-12-08 LAB — BASIC METABOLIC PANEL
Anion gap: 8 (ref 5–15)
BUN: 11 mg/dL (ref 8–23)
CO2: 23 mmol/L (ref 22–32)
Calcium: 8.3 mg/dL — ABNORMAL LOW (ref 8.9–10.3)
Chloride: 108 mmol/L (ref 98–111)
Creatinine, Ser: 1.79 mg/dL — ABNORMAL HIGH (ref 0.61–1.24)
GFR, Estimated: 39 mL/min — ABNORMAL LOW (ref >60)
Glucose, Bld: 90 mg/dL (ref 70–99)
Potassium: 3.7 mmol/L (ref 3.5–5.1)
Sodium: 139 mmol/L (ref 135–145)

## 2020-12-08 SURGERY — LAPAROSCOPIC PARTIAL COLECTOMY
Anesthesia: General

## 2020-12-08 MED ORDER — ROCURONIUM BROMIDE 10 MG/ML (PF) SYRINGE
PREFILLED_SYRINGE | INTRAVENOUS | Status: AC
  Filled 2020-12-08: qty 30

## 2020-12-08 MED ORDER — EPHEDRINE SULFATE-NACL 50-0.9 MG/10ML-% IV SOSY
PREFILLED_SYRINGE | INTRAVENOUS | Status: DC | PRN
  Administered 2020-12-08 (×3): 5 mg via INTRAVENOUS

## 2020-12-08 MED ORDER — SUCCINYLCHOLINE CHLORIDE 200 MG/10ML IV SOSY
PREFILLED_SYRINGE | INTRAVENOUS | Status: DC | PRN
  Administered 2020-12-08: 120 mg via INTRAVENOUS

## 2020-12-08 MED ORDER — CHLORHEXIDINE GLUCONATE 0.12 % MT SOLN
OROMUCOSAL | Status: AC
  Administered 2020-12-08: 15 mL via OROMUCOSAL
  Filled 2020-12-08: qty 15

## 2020-12-08 MED ORDER — PHENYLEPHRINE 40 MCG/ML (10ML) SYRINGE FOR IV PUSH (FOR BLOOD PRESSURE SUPPORT)
PREFILLED_SYRINGE | INTRAVENOUS | Status: DC | PRN
  Administered 2020-12-08: 80 ug via INTRAVENOUS

## 2020-12-08 MED ORDER — ACETAMINOPHEN 325 MG PO TABS: 325.0000 mg | ORAL_TABLET | Freq: Once | ORAL | Status: DC | PRN

## 2020-12-08 MED ORDER — FENTANYL CITRATE (PF) 250 MCG/5ML IJ SOLN
INTRAMUSCULAR | Status: DC | PRN
  Administered 2020-12-08 (×2): 25 ug via INTRAVENOUS
  Administered 2020-12-08: 100 ug via INTRAVENOUS
  Administered 2020-12-08: 25 ug via INTRAVENOUS

## 2020-12-08 MED ORDER — ACETAMINOPHEN 10 MG/ML IV SOLN: 1000.0000 mg | Freq: Once | INTRAVENOUS | Status: DC | PRN

## 2020-12-08 MED ORDER — SUGAMMADEX SODIUM 200 MG/2ML IV SOLN
INTRAVENOUS | Status: DC | PRN
  Administered 2020-12-08: 300 mg via INTRAVENOUS

## 2020-12-08 MED ORDER — PROPOFOL 10 MG/ML IV BOLUS
INTRAVENOUS | Status: DC | PRN
  Administered 2020-12-08: 120 mg via INTRAVENOUS

## 2020-12-08 MED ORDER — MEPERIDINE HCL 25 MG/ML IJ SOLN: 6.2500 mg | INTRAMUSCULAR | Status: DC | PRN

## 2020-12-08 MED ORDER — DEXAMETHASONE SODIUM PHOSPHATE 10 MG/ML IJ SOLN
INTRAMUSCULAR | Status: AC
  Filled 2020-12-08: qty 3

## 2020-12-08 MED ORDER — PROMETHAZINE HCL 25 MG/ML IJ SOLN: 6.2500 mg | INTRAMUSCULAR | Status: DC | PRN

## 2020-12-08 MED ORDER — BUPIVACAINE-EPINEPHRINE 0.25% -1:200000 IJ SOLN
INTRAMUSCULAR | Status: DC | PRN
  Administered 2020-12-08: 20 mL

## 2020-12-08 MED ORDER — ESMOLOL HCL 100 MG/10ML IV SOLN
INTRAVENOUS | Status: AC
  Filled 2020-12-08: qty 10

## 2020-12-08 MED ORDER — PROPOFOL 10 MG/ML IV BOLUS
INTRAVENOUS | Status: AC
  Filled 2020-12-08: qty 20

## 2020-12-08 MED ORDER — ACETAMINOPHEN 160 MG/5ML PO SOLN: 325.0000 mg | Freq: Once | ORAL | Status: DC | PRN

## 2020-12-08 MED ORDER — ONDANSETRON HCL 4 MG/2ML IJ SOLN
INTRAMUSCULAR | Status: AC
  Filled 2020-12-08: qty 6

## 2020-12-08 MED ORDER — CHLORHEXIDINE GLUCONATE 0.12 % MT SOLN: 15.0000 mL | Freq: Once | OROMUCOSAL | Status: AC

## 2020-12-08 MED ORDER — SODIUM CHLORIDE 0.9 % IR SOLN
Status: DC | PRN
  Administered 2020-12-08: 1000 mL

## 2020-12-08 MED ORDER — SODIUM CHLORIDE 0.9 % IV SOLN
INTRAVENOUS | Status: DC | PRN
  Administered 2020-12-08: 2 g via INTRAVENOUS

## 2020-12-08 MED ORDER — FENTANYL CITRATE (PF) 250 MCG/5ML IJ SOLN
INTRAMUSCULAR | Status: AC
  Filled 2020-12-08: qty 5

## 2020-12-08 MED ORDER — DEXAMETHASONE SODIUM PHOSPHATE 10 MG/ML IJ SOLN
INTRAMUSCULAR | Status: DC | PRN
  Administered 2020-12-08: 10 mg via INTRAVENOUS

## 2020-12-08 MED ORDER — PHENYLEPHRINE HCL-NACL 20-0.9 MG/250ML-% IV SOLN
INTRAVENOUS | Status: DC | PRN
  Administered 2020-12-08: 20 ug/min via INTRAVENOUS

## 2020-12-08 MED ORDER — ONDANSETRON HCL 4 MG/2ML IJ SOLN
INTRAMUSCULAR | Status: DC | PRN
  Administered 2020-12-08: 4 mg via INTRAVENOUS

## 2020-12-08 MED ORDER — LACTATED RINGERS IV SOLN: INTRAVENOUS | Status: DC

## 2020-12-08 MED ORDER — 0.9 % SODIUM CHLORIDE (POUR BTL) OPTIME
TOPICAL | Status: DC | PRN
  Administered 2020-12-08: 1000 mL

## 2020-12-08 MED ORDER — AMISULPRIDE (ANTIEMETIC) 5 MG/2ML IV SOLN: 10.0000 mg | Freq: Once | INTRAVENOUS | Status: DC | PRN

## 2020-12-08 MED ORDER — LIDOCAINE 2% (20 MG/ML) 5 ML SYRINGE
INTRAMUSCULAR | Status: DC | PRN
  Administered 2020-12-08: 40 mg via INTRAVENOUS

## 2020-12-08 MED ORDER — HYDROMORPHONE HCL 1 MG/ML IJ SOLN: 0.2500 mg | INTRAMUSCULAR | Status: DC | PRN

## 2020-12-08 MED ORDER — ORAL CARE MOUTH RINSE: 15.0000 mL | Freq: Once | OROMUCOSAL | Status: AC

## 2020-12-08 MED ORDER — SUCCINYLCHOLINE CHLORIDE 200 MG/10ML IV SOSY
PREFILLED_SYRINGE | INTRAVENOUS | Status: AC
  Filled 2020-12-08: qty 20

## 2020-12-08 MED ORDER — LIDOCAINE 2% (20 MG/ML) 5 ML SYRINGE
INTRAMUSCULAR | Status: AC
  Filled 2020-12-08: qty 15

## 2020-12-08 MED ORDER — BUPIVACAINE-EPINEPHRINE (PF) 0.25% -1:200000 IJ SOLN
INTRAMUSCULAR | Status: AC
  Filled 2020-12-08: qty 30

## 2020-12-08 MED ORDER — LACTATED RINGERS IV SOLN: INTRAVENOUS | Status: DC | PRN

## 2020-12-08 MED ORDER — ROCURONIUM BROMIDE 10 MG/ML (PF) SYRINGE
PREFILLED_SYRINGE | INTRAVENOUS | Status: DC | PRN
Start: 2020-12-08 — End: 2020-12-08
  Administered 2020-12-08: 50 mg via INTRAVENOUS
  Administered 2020-12-08: 20 mg via INTRAVENOUS

## 2020-12-08 SURGICAL SUPPLY — 62 items
APPLIER CLIP 5 13 M/L LIGAMAX5 (MISCELLANEOUS)
APPLIER CLIP ROT 10 11.4 M/L (STAPLE)
APR CLP MED LRG 11.4X10 (STAPLE)
APR CLP MED LRG 5 ANG JAW (MISCELLANEOUS)
BAG COUNTER SPONGE SURGICOUNT (BAG) ×3 IMPLANT
BAG SPNG CNTER NS LX DISP (BAG) ×2
BLADE CLIPPER SURG (BLADE) IMPLANT
CANISTER SUCT 3000ML PPV (MISCELLANEOUS) ×3 IMPLANT
CELLS DAT CNTRL 66122 CELL SVR (MISCELLANEOUS) IMPLANT
CLIP APPLIE 5 13 M/L LIGAMAX5 (MISCELLANEOUS) IMPLANT
CLIP APPLIE ROT 10 11.4 M/L (STAPLE) IMPLANT
COVER SURGICAL LIGHT HANDLE (MISCELLANEOUS) ×6 IMPLANT
DRSG OPSITE POSTOP 4X10 (GAUZE/BANDAGES/DRESSINGS) IMPLANT
DRSG OPSITE POSTOP 4X6 (GAUZE/BANDAGES/DRESSINGS) ×1 IMPLANT
DRSG OPSITE POSTOP 4X8 (GAUZE/BANDAGES/DRESSINGS) IMPLANT
DRSG TEGADERM 2-3/8X2-3/4 SM (GAUZE/BANDAGES/DRESSINGS) ×1 IMPLANT
ELECT CAUTERY BLADE 6.4 (BLADE) ×6 IMPLANT
ELECT REM PT RETURN 9FT ADLT (ELECTROSURGICAL) ×3
ELECTRODE REM PT RTRN 9FT ADLT (ELECTROSURGICAL) ×2 IMPLANT
GAUZE SPONGE 2X2 8PLY NS (GAUZE/BANDAGES/DRESSINGS) ×1 IMPLANT
GEL ULTRASOUND 20GR AQUASONIC (MISCELLANEOUS) IMPLANT
GLOVE SURG POLYISO LF SZ7 (GLOVE) ×6 IMPLANT
GLOVE SURG UNDER POLY LF SZ7 (GLOVE) ×6 IMPLANT
GOWN STRL REUS W/ TWL LRG LVL3 (GOWN DISPOSABLE) ×12 IMPLANT
GOWN STRL REUS W/TWL LRG LVL3 (GOWN DISPOSABLE) ×18
KIT SIGMOIDOSCOPE (SET/KITS/TRAYS/PACK) IMPLANT
KIT TURNOVER KIT B (KITS) ×3 IMPLANT
LIGASURE IMPACT 36 18CM CVD LR (INSTRUMENTS) IMPLANT
LIGASURE MARYLAND LAP STAND (ELECTROSURGICAL) IMPLANT
NEEDLE 22X1 1/2 (OR ONLY) (NEEDLE) ×3 IMPLANT
NS IRRIG 1000ML POUR BTL (IV SOLUTION) ×6 IMPLANT
PACK COLON (CUSTOM PROCEDURE TRAY) ×3 IMPLANT
PAD ARMBOARD 7.5X6 YLW CONV (MISCELLANEOUS) ×6 IMPLANT
PENCIL SMOKE EVACUATOR (MISCELLANEOUS) ×6 IMPLANT
RELOAD PROXIMATE 75MM BLUE (ENDOMECHANICALS) ×3 IMPLANT
RELOAD STAPLE 75 3.8 BLU REG (ENDOMECHANICALS) IMPLANT
RETRACTOR WND ALEXIS 18 MED (MISCELLANEOUS) IMPLANT
RTRCTR WOUND ALEXIS 18CM MED (MISCELLANEOUS)
SCISSORS LAP 5X35 DISP (ENDOMECHANICALS) ×3 IMPLANT
SET IRRIG TUBING LAPAROSCOPIC (IRRIGATION / IRRIGATOR) IMPLANT
SHEARS HARMONIC ACE PLUS 36CM (ENDOMECHANICALS) ×4 IMPLANT
SLEEVE ENDOPATH XCEL 5M (ENDOMECHANICALS) ×5 IMPLANT
SPECIMEN JAR LARGE (MISCELLANEOUS) ×3 IMPLANT
STAPLER GUN LINEAR PROX 60 (STAPLE) ×1 IMPLANT
STAPLER PROXIMATE 75MM BLUE (STAPLE) ×1 IMPLANT
STAPLER VISISTAT 35W (STAPLE) ×3 IMPLANT
SURGILUBE 2OZ TUBE FLIPTOP (MISCELLANEOUS) IMPLANT
SUT PROLENE 2 0 CT2 30 (SUTURE) IMPLANT
SUT PROLENE 2 0 KS (SUTURE) IMPLANT
SUT SILK 2 0 SH CR/8 (SUTURE) ×3 IMPLANT
SUT SILK 2 0 TIES 10X30 (SUTURE) ×3 IMPLANT
SUT SILK 3 0 SH CR/8 (SUTURE) ×3 IMPLANT
SUT SILK 3 0 TIES 10X30 (SUTURE) ×3 IMPLANT
SYS LAPSCP GELPORT 120MM (MISCELLANEOUS)
SYSTEM LAPSCP GELPORT 120MM (MISCELLANEOUS) IMPLANT
TRAY FOLEY MTR SLVR 16FR STAT (SET/KITS/TRAYS/PACK) ×3 IMPLANT
TROCAR XCEL 12X100 BLDLESS (ENDOMECHANICALS) IMPLANT
TROCAR XCEL BLUNT TIP 100MML (ENDOMECHANICALS) IMPLANT
TROCAR XCEL NON-BLD 11X100MML (ENDOMECHANICALS) IMPLANT
TROCAR XCEL NON-BLD 5MMX100MML (ENDOMECHANICALS) ×4 IMPLANT
TUBE CONNECTING 12X1/4 (SUCTIONS) ×6 IMPLANT
WATER STERILE IRR 1000ML POUR (IV SOLUTION) ×3 IMPLANT

## 2020-12-08 NOTE — Progress Notes (Addendum)
   Palliative Medicine Inpatient Follow Up Note   Consulting Provider: Jesusita Oka, MD   Reason for consult:   [place] Palliative Medicine Consult  Reason for Consult?      HPI:  Per intake H&P --> Bradley Stokes is a 75 y.o. male with medical history significant of hypertension, hyperlipidemia, paroxysmal atrial fibrillation on Coumadin, osteoarthritis, and obesity who presents with complaints of bleeding rectally, he was noted to have INR of 2.9, H&H was stable he was admitted to the hospital for further blood per rectum work-up.  Patient went for endoscopy/colonoscopy 9/7, which was significant for malignant 5 cm tumor in the proximal descending colon which was biopsied.  Palliative care was asked to get involved to further broach goals of care conversations.  Stokes's Discussion (12/08/2020 ):  *Please note that this is a verbal dictation therefore any spelling or grammatical errors are due to the "[facility]" system interpretation.  Chart reviewed. Bradley Stokes's surgery has been delayed.  I met at bedside this morning with Bradley Stokes he was noted to be resting and showing no nonverbal s/s of distress.  I spoke to patients RN, Bradley Stokes who states she has no concerns in regards to Bradley Stokes.   Questions and concerns addressed   Objective Assessment: Vital Signs Vitals:   12/08/20 0410 12/08/20 0739  BP: (!) 144/90 (!) 153/54  Pulse:  76  Resp: 18 19  Temp: 98.2 F (36.8 C) 98.5 F (36.9 C)  SpO2: 98% 97%    Intake/Output Summary (Last 24 hours) at 12/08/2020 1039 Last data filed at 12/07/2020 1958 Gross per 24 hour  Intake --  Output 275 ml  Net -275 ml   Last Weight  Most recent update: 12/08/2020  5:26 AM    Weight  131.6 kg (290 lb 2 oz)            Gen: Obese older caucasian M in NAD HEENT: moist mucous membranes CV: Regular rate and irregular rhythm  PULM: On RA ABD: soft/nontender  EXT: (+) LE edema  Neuro:  Sleeping  SUMMARY OF RECOMMENDATIONS   DNAR/DNI   MOST Completed/DNR Form Completed, paper copy placed onto the chart electric copy can be found in [employer] Completed   Will need to await biopsy to see what further possibly therapies would involve   Plan for surgery for colon mass, this has been delayed for the time being   TOC - OP Palliative support   Patient would like to be placed in a SNF with his sister if possible   Personal goals are to be as independent as possible for as long as possible   Ongoing support incrementally while Bradley Stokes is hospitalized  Time Spent: 15 Greater than 50% of the time was spent in counseling and coordination of care ______________________________________________________________________________________ [place] Team Team Cell Phone: [phone] Please utilize secure chat with additional questions, if there is no response within 30 minutes please call the above phone number  Palliative Medicine Team providers are available by phone from 7am to 7pm daily and can be reached through the team cell phone.  Should this patient require assistance outside of these hours, please call the patient's attending physician.

## 2020-12-08 NOTE — Progress Notes (Signed)
PROGRESS NOTE                                                                                                                                                                                                             Patient Demographics:    Bradley Stokes, is a 75 y.o. male, DOB - 20-Jun-1945, TGY:[id number]  Outpatient Primary MD for the patient is [name], [name], MD    LOS - 5  Admit date - 12/02/2020    Chief Complaint  Patient presents with   Rectal Bleeding       Brief Narrative    Bradley Stokes is a 75 y.o. male with medical history significant of hypertension, hyperlipidemia, paroxysmal atrial fibrillation on Coumadin, osteoarthritis, and obesity who presents with complaints of bleeding rectally, he was noted to have INR of 2.9, H&H was stable he was admitted to the hospital for further blood per rectum work-up.  Patient went for endoscopy/colonoscopy 9/7, which was significant for malignant 5 cm tumor in the proximal descending colon which was biopsied and was significant for malignancy.  Significant events -9/7 EGD/colonoscopy   Subjective:   Patient denies any complaints overnight, nausea, no vomiting, he is awaiting his surgery this afternoon.   Assessment  & Plan :     Acute lower GI bleed/acute blood loss anemia due to colonic mass.  . -Patient on warfarin, which has been held, he received vitamin K as well,  INR has normalized now.   -Continue to monitor H&H closely, so far no indication for transfusion.   -On Protonix.  Colonic mass -Status post EGD/colonoscopy 9/7 significant for 5 cm colon mass in proximal descending colon. -Biopsy significant for invasive moderately differentiated adenocarcinoma. -CEA is normal 3.4 -General surgery consulted , plan for surgery today.   -CT abdomen pelvis significant for 2 subcentimeter hypodense hepatic lesion, characterized, and single 1.3 cm lymph node  posterior to colonic lesion, likely local nodal involvement, no additional pathology to the enlarged abdominal or pelvic lymph nodes.  AKI on CKD 3B  - Baseline creatinine close to 2, peaked at 2.4, improving with holding Lasix, and gentle IV hydration.  Creatinine is 1.79 this morning.  Paroxysmal A. fib with CHA2DS2-VASc 2 score of greater than 3.  Continue amiodarone for rate control. -Continue to hold warfarin, anticoagulation secondary to bleeding colon mass.  Morbid obesity.  BMI of 41.  Follow with PCP for weight loss.  Chronic diastolic CHF last known EF is 55% in 2016.  Has a chronic leg edema continue Lasix held for bowel prep and mild AKI. Added TED stockings.  Dyslipidemia - statin.           Condition -  Guarded  Family Communication  :  None present at bedside , patient NOK , his sister is hospitalized as well.  Code Status :  Full  Consults  :  GI, General surgery  PUD Prophylaxis : PPI   Procedures  :            Disposition Plan  :    Status is: Inpatient  Remains inpatient appropriate because:IV treatments appropriate due to intensity of illness or inability to take PO  Dispo: The patient is from: Home              Anticipated d/c is to: Home likely will need SNF.              Patient currently is not medically stable to d/c.   Difficult to place patient No  DVT Prophylaxis  :    SCD's Start: 12/06/20 1437 Place TED hose Start: 12/03/20 1131 SCDs Start: 12/02/20 1125    Lab Results  Component Value Date   PLT 307 12/08/2020    Diet :  Diet Order             Diet NPO time specified Except for: Ice Chips, Sips with Meds  Diet effective ____                    Inpatient Medications  Scheduled Meds:  [MAR Hold] allopurinol  100 mg Oral Daily   [MAR Hold] amiodarone  100 mg Oral Daily   [MAR Hold] ammonium lactate  1 application Topical BID   [MAR Hold] atorvastatin  20 mg Oral Daily   bupivacaine liposome  20 mL Infiltration  Once   [MAR Hold] calcitRIOL  0.5 mcg Oral Daily   [MAR Hold] pantoprazole  40 mg Oral Daily   [MAR Hold] phosphorus  250 mg Oral BID   [MAR Hold] potassium chloride SA  20 mEq Oral Daily   [MAR Hold] sodium chloride flush  3 mL Intravenous Q12H   Continuous Infusions:  sodium chloride Stopped (12/08/20 1155)   lactated ringers 10 mL/hr at 12/08/20 1239    PRN Meds:.[MAR Hold] acetaminophen **OR** [MAR Hold] acetaminophen, [MAR Hold] albuterol  Antibiotics  :    Anti-infectives (From admission, onward)    Start     Dose/Rate Route Frequency Ordered Stop   12/07/20 1445  cefoTEtan (CEFOTAN) 2 g in sodium chloride 0.9 % 100 mL IVPB        2 g 200 mL/hr over 30 Minutes Intravenous On call to O.R. 12/07/20 1355 12/08/20 0559   12/07/20 0600  cefoTEtan (CEFOTAN) 2 g in sodium chloride 0.9 % 100 mL IVPB  Status:  Discontinued        2 g 200 mL/hr over 30 Minutes Intravenous On call to O.R. 12/06/20 1436 12/07/20 2042   12/06/20 2200  neomycin (MYCIFRADIN) tablet 1,000 mg       See Hyperspace for full Linked Orders Report.   1,000 mg Oral 3 times per day 12/06/20 1436 12/07/20 1614   12/06/20 2200  metroNIDAZOLE (FLAGYL) tablet 1,000 mg       See Hyperspace for full Linked Orders Report.  1,000 mg Oral 3 times per day 12/06/20 1436 12/07/20 1444          Hend Mccarrell M.D on 12/08/2020 at 1:14 PM  To page go to [email or web address]   [facility] -  Office  [phone]    See all Orders from today for further details    Objective:   Vitals:   12/08/20 0410 12/08/20 0500 12/08/20 0739 12/08/20 1209  BP: (!) 144/90  (!) 153/54 (!) 167/49  Pulse:   76 76  Resp: 18  19 20   Temp: 98.2 F (36.8 C)  98.5 F (36.9 C) 98.3 F (36.8 C)  TempSrc: Oral  Oral Oral  SpO2: 98%  97% 100%  Weight:  131.6 kg  130.6 kg  Height:    5\' 9"  (1.753 m)    Wt Readings from Last 3 Encounters:  12/08/20 130.6 kg  06/12/20 130.2 kg  12/21/19 135.5 kg     Intake/Output Summary  (Last 24 hours) at 12/08/2020 1314 Last data filed at 12/08/2020 1041 Gross per 24 hour  Intake 0 ml  Output 275 ml  Net -275 ml     Physical Exam  Awake Alert, Oriented X 3, No new F.N deficits, Normal affect Symmetrical Chest wall movement, Good air movement bilaterally, CTAB RRR,No Gallops,Rubs or new Murmurs, No Parasternal Heave +ve B.Sounds, Abd Soft, No tenderness, No rebound - guarding or rigidity Chronic lower ext edema with chronic discoloration .  Photo on 9/9         Data Review:    CBC Recent Labs  Lab 12/02/20 0911 12/02/20 1212 12/04/20 0202 12/05/20 0033 12/06/20 0144 12/07/20 0255 12/08/20 0026  WBC 7.0   < > 8.8 12.4* 9.4 9.9 7.6  HGB 9.7*   < > 8.8* 9.5* 8.2* 8.4* 8.1*  HCT 31.7*   < > 27.9* 30.2* 26.0* 27.1* 26.0*  PLT 340   < > 296 343 297 302 307  MCV 101.6*   < > 99.3 100.3* 100.8* 101.5* 102.0*  MCH 31.1   < > 31.3 31.6 31.8 31.5 31.8  MCHC 30.6   < > 31.5 31.5 31.5 31.0 31.2  RDW 13.8   < > 13.8 13.6 13.7 14.0 14.3  LYMPHSABS 1.3  --  1.9 1.5 1.3 1.1  --   MONOABS 0.5  --  1.0 1.2* 0.8 1.1*  --   EOSABS 0.1  --  0.3 0.2 0.5 0.3  --   BASOSABS 0.0  --  0.1 0.1 0.0 0.0  --    < > = values in this interval not displayed.    Recent Labs  Lab 12/02/20 0911 12/03/20 0056 12/04/20 0202 12/05/20 0033 12/06/20 0144 12/07/20 0255 12/08/20 0026  NA 141   < > 140 138 140 137 139  K 3.5   < > 3.5 3.8 3.7 3.8 3.7  CL 106   < > 104 103 106 105 108  CO2 26   < > 26 26 25 26 23   GLUCOSE 104*   < > 86 95 82 113* 90  BUN 16   < > 20 20 19 14 11   CREATININE 2.07*   < > 2.38* 2.26* 2.10* 1.95* 1.79*  CALCIUM 8.8*   < > 8.5* 9.1 8.6* 8.5* 8.3*  AST 14*  --  15 21 21 17   --   ALT 6  --  6 8 8 9   --   ALKPHOS 108  --  99 104 93 101  --   BILITOT  1.0  --  1.5* 1.7* 1.3* 1.2  --   ALBUMIN 3.1*  --  3.0* 3.3* 2.8* 2.7*  --   MG  --   --  2.1 2.2 2.1 2.0  --   INR 2.9*   < > 2.3* 1.4* 1.2 1.2 1.2  BNP  --   --  23.2 23.7 79.8 80.7  --    < > =  values in this interval not displayed.    ------------------------------------------------------------------------------------------------------------------ No results for input(s): CHOL, HDL, LDLCALC, TRIG, CHOLHDL, LDLDIRECT in the last 72 hours.  No results found for: HGBA1C ------------------------------------------------------------------------------------------------------------------ No results for input(s): TSH, T4TOTAL, T3FREE, THYROIDAB in the last 72 hours.  Invalid input(s): FREET3  Cardiac Enzymes No results for input(s): CKMB, TROPONINI, MYOGLOBIN in the last 168 hours.  Invalid input(s): CK ------------------------------------------------------------------------------------------------------------------    Component Value Date/Time   BNP 80.7 12/07/2020 0255     Radiology Reports CT CHEST ABDOMEN PELVIS W CONTRAST  Result Date: 12/06/2020 CLINICAL DATA:  Colon cancer, initial staging. EXAM: CT CHEST, ABDOMEN, AND PELVIS WITH CONTRAST TECHNIQUE: Multidetector CT imaging of the chest, abdomen and pelvis was performed following the standard protocol during bolus administration of intravenous contrast. CONTRAST:  63mL OMNIPAQUE IOHEXOL 350 MG/ML SOLN COMPARISON:  No imaging available for comparison at time of dictation. FINDINGS: CT CHEST FINDINGS Cardiovascular: Aortic and branch vessel atherosclerosis without aneurysmal dilation. Three-vessel coronary artery calcifications. Normal size heart. No significant pericardial effusion/thickening. No central pulmonary embolus. Mediastinum/Nodes: No supraclavicular adenopathy. No discrete thyroid nodule. No pathologically enlarged mediastinal, hilar or axillary lymph nodes. The trachea and esophagus are unremarkable. Lungs/Pleura: No suspicious pulmonary nodules or masses. Bibasilar atelectasis. No pleural effusion. No pneumothorax. Musculoskeletal: Healing left lateral eighth and ninth rib fractures, without associated lesion to suggest  pathologic fracture. No aggressive lytic or blastic lesion of bone. CT ABDOMEN PELVIS FINDINGS Hepatobiliary: Hypodense 7 mm lesion in the left lobe of the liver on image 48/3 and a hypodense 4 mm lesion in the right lobe of the liver on image 49/3, incompletely characterized on this examination. Gallbladder is unremarkable. No biliary ductal dilation. Pancreas: No pancreatic ductal dilatation or surrounding inflammatory changes. Spleen: Within normal limits. Adrenals/Urinary Tract: Bilateral adrenal glands are unremarkable. No hydronephrosis. Bilateral renal cortical thinning. Urinary bladder is unremarkable for degree of distension. Stomach/Bowel: Stomach is decompressed. Periampullary duodenal diverticulum. No pathologic dilation of small or large bowel. Radiopaque enteric contrast traverses the sigmoid colon. Short segment of colonic wall thickening involving the splenic flexure for instance on image 64/3 and 28/4 likely reflects patient's presumed colonic malignancy. Left-sided colonic diverticulosis without findings of acute diverticulitis. Vascular/Lymphatic: Aorta bi-iliac atherosclerotic calcifications without abdominal aortic aneurysm. 1.3 cm lymph node posterior to the colonic lesion on image 61/3 likely reflecting local nodal involvement. No additional pathologically enlarged abdominal or pelvic lymph nodes. Reproductive: Prostate is unremarkable. Other: No significant abdominopelvic ascites. Nonspecific body wall edema. Musculoskeletal: Multilevel degenerative changes spine. No aggressive lytic or blastic lesion of bone. IMPRESSION: 1. Short segment of colonic wall thickening involving the splenic flexure likely reflects patient's presumed colonic malignancy. 2. Single 1.3 cm lymph node posterior to the colonic lesion likely reflecting local nodal involvement. No additional pathologically enlarged abdominal or pelvic lymph nodes to suggest distant nodal metastases. 3. Two subcentimeter hypodense hepatic  lesions, incompletely characterized on this examination. Possibly reflecting benign hepatic cysts but cystic hepatic metastases not excluded on this study. Consider attention on close interval follow-up imaging versus further evaluation with hepatic protocol MRI with and  without contrast, preferably as an outpatient when patient is stable and able to follow commands. 4. Healing nonpathologic left lateral eighth and ninth rib fractures. 5. Aortic Atherosclerosis (ICD10-I70.0). Electronically Signed   By: Dahlia Bailiff M.D.   On: 12/06/2020 20:56   DG CHEST PORT 1 VIEW  Result Date: 12/02/2020 CLINICAL DATA:  Chronic atrial fibrillation. Reported history of rectal bleeding. EXAM: PORTABLE CHEST 1 VIEW COMPARISON:  Chest radiograph dated 07/10/2016. FINDINGS: The heart size is enlarged. Vascular calcifications are seen in the aortic arch. The left costophrenic angle is obscured which may reflect pericardial fat versus a pleural effusion. The right lung is clear and there is no right pleural effusion. There is no pneumothorax. The visualized skeletal structures are unremarkable. IMPRESSION: Cardiomegaly. Obscured left costophrenic angle may represent pericardial fat versus a pleural effusion. Aortic Atherosclerosis (ICD10-I70.0). Electronically Signed   By: Zerita Boers M.D.   On: 12/02/2020 15:11

## 2020-12-08 NOTE — Op Note (Addendum)
PATIENT:  Bradley Stokes  75 y.o. male  PRE-OPERATIVE DIAGNOSIS:  Colon Mass  POST-OPERATIVE DIAGNOSIS:  Colon Mass  PROCEDURE:  Procedure(s): Laparoscopic Extended Right Colectomy Laparoscopic Splenic flexure mobilization Ileocolonic anastomosis creation  SURGEON:  Kinsinger, Arta Bruce, MD   ASSISTANT: Drucie Ip, MD  ANESTHESIA:   local and general  The patient was identified & brought into the operating room, placed supine on the operating table and SCDs were applied to the lower extremities. General endotracheal anesthesia was induced. The patient was positioned in low lithotomy with stirrups with both arms tucked. Antibiotics were administered. A foley catheter was placed under sterile conditions. Hair in the region of planned surgery was clipped. The abdomen was prepped and draped in a sterile fashion. A timeout was performed confirming our patient and plan.   The patient was positioned in reverse Trendelenburg.  An incision was made in the right upper quadrant. Optical entry was used to enter the abdomen. Entry was clean.  I induced carbon dioxide insufflation.  Camera inspection revealed no injury.  The abdomen was surveyed. The liver and peritoneum appeared normal.  There were no signs of metastatic disease. 2 additional 5 mm trocars were placed on in the right lateral space on in the right mid abdomen. Extra ports were carefully placed under direct laparoscopic visualization.  I scored the base of peritoneum of the right side of the mesentery of the left colon from the ligament of Treitz to the peritoneal reflection of the mid descending colon.  An obvious tattoo noted in the mid transverse colon. I mobilized the left colon in a lateral to medial fashion off the line of Toldt up towards the splenic flexure to ensure good mobilization of the left colon. Mobilization was carried to the mid transverse colon. One additional 5 mm trocar was placed subxiphoid and focus was turned to  right colon.  The ileocolic pedicle was identified. Gentle blunt dissection commenced around the pedicle and the duodenum was identified and freed from the surrounding structures. I then separated the structures bluntly and used the Enseal device to transect these separately.  I developed the retroperitoneal plane bluntly.  I then freed the appendix off its attachments to the pelvic wall. I mobilized the terminal ileum.  I took care to avoid injuring any retroperitoneal structures.  After this I began to mobilize laterally down the white line of Toldt and then took down the hepatic flexure using the Enseal device. I mobilized the omentum off of the right transverse colon. The entire colon was then flipped medially and mobilized off of the retroperitoneal structures until I could visualize the lateral edge of the duodenum underneath.  I gently freed the duodenal attachments. The remaining mesentery was divided using the Enseal device. The divided mesentery was inspected and noted to be hemostatic.  A mini-laparotomy extraction site was created in the subxiphoid region at the prior existing port site and a wound protector was used. At this point, the abdomen was desufflated and the terminal ileum, right colon, transverse colon, and partial left colon were delivered through the wound protector. The terminal ileum was then transected using a GIA blue load stapler.  The distal point of transection was then identified on the left colon at a location that included left colic feeding the remaining left colon. This was transected using another blue load GIA stapler.  The specimen was then passed off. Attention was turned to creating the anastomosis. The terminal ileum and left colon were inspected for orientation  to ensure no twisting nor bowel included in the mesenteric defect. An anastomosis was created between the terminal ileum and the transverse colon using a 75 mm GIA blue load stapler. The staple line was  inspected and noted to be hemostatic.  The common enterotomy channel was closed using a TA 60 blue load stapler. Hemostasis was achieved at the staple line using 3-0 silk stitches.  A 2-0 silk suture was placed securing the "crotch" of the anastomosis. The anastomosis was palpated and noted to be widely patent. This was then placed back into the abdomen.  The abdomen was inspected and hemostasis verified. The omentum was then brought down over the anastomosis. The Alexis wound protector and port sites were removed, counts were reported correct, and we switched to clean instruments, gowns and drapes.  The fascia was then closed using two running #1 PDS sutures.  The skin of all incision sites was closed with staples. The patient was then awakened from anesthesia and sent to the post anesthesia care unit in stable condition.   Findings: Tattooed mass in mid-transverse colon  Specimen: right and transverse colon with terminal ileum  Blood loss: 50 ml  Local anesthesia: 30 ml Marcaine  Complications: none  PLAN OF CARE: Return to inpatient bed  PATIENT DISPOSITION:  PACU - hemodynamically stable.  Drucie Ip, MD

## 2020-12-08 NOTE — Anesthesia Procedure Notes (Signed)
Procedure Name: Intubation Date/Time: 12/08/2020 2:33 PM Performed by: Thelma Comp, CRNA Pre-anesthesia Checklist: Patient identified, Emergency Drugs available, Suction available and Patient being monitored Patient Re-evaluated:Patient Re-evaluated prior to induction Oxygen Delivery Method: Circle System Utilized Preoxygenation: Pre-oxygenation with 100% oxygen Induction Type: IV induction, Rapid sequence and Cricoid Pressure applied Ventilation: Mask ventilation without difficulty Tube type: Oral Tube size: 7.5 mm Number of attempts: 1 Airway Equipment and Method: Stylet Placement Confirmation: ETT inserted through vocal cords under direct vision, positive ETCO2 and breath sounds checked- equal and bilateral Secured at: 23 cm Tube secured with: Tape Dental Injury: Teeth and Oropharynx as per pre-operative assessment

## 2020-12-08 NOTE — Progress Notes (Signed)
To OR via bed accompanied by surgery staff.

## 2020-12-08 NOTE — Progress Notes (Signed)
Pt returned to room from surgery via bed alert & oriented. Noted to have lap sites with tegaderm drsg X 4 noted to have some amt of pinkish drainage to Rt mid quadrant site. Moderate amt of bloody drainage to midline abd Honeycomb drsg.

## 2020-12-08 NOTE — Anesthesia Preprocedure Evaluation (Addendum)
Anesthesia Evaluation  Patient identified by MRN, date of birth, ID band Patient awake    Reviewed: Allergy & Precautions, NPO status , Patient's Chart, lab work & pertinent test results  Airway Mallampati: I  TM Distance: >3 FB Neck ROM: Full    Dental  (+) Edentulous Upper, Edentulous Lower   Pulmonary neg pulmonary ROS,    Pulmonary exam normal        Cardiovascular hypertension, + dysrhythmias Atrial Fibrillation + Valvular Problems/Murmurs  Rhythm:Regular Rate:Abnormal  Echo:  - Left ventricle: The cavity size was normal. Wall thickness was  normal. Systolic function was normal. The estimated ejection  fraction was in the range of 55% to 60%.  - Aortic valve: There was mild regurgitation.  - Left atrium: The atrium was moderately dilated.    Neuro/Psych negative neurological ROS  negative psych ROS   GI/Hepatic negative GI ROS, Neg liver ROS,   Endo/Other    Renal/GU CRF and Renal InsufficiencyRenal disease     Musculoskeletal  (+) Arthritis ,   Abdominal Normal abdominal exam  (+)   Peds  Hematology   Anesthesia Other Findings   Reproductive/Obstetrics                            Anesthesia Physical Anesthesia Plan  ASA: 3  Anesthesia Plan: General   Post-op Pain Management:    Induction: Intravenous  PONV Risk Score and Plan: 3 and Ondansetron, Dexamethasone and Midazolam  Airway Management Planned: Oral ETT  Additional Equipment: None  Intra-op Plan:   Post-operative Plan: Extubation in OR  Informed Consent: I have reviewed the patients History and Physical, chart, labs and discussed the procedure including the risks, benefits and alternatives for the proposed anesthesia with the patient or authorized representative who has indicated his/her understanding and acceptance.   Patient has DNR.  Discussed DNR with patient and Suspend DNR.   Dental advisory  given  Plan Discussed with: CRNA  Anesthesia Plan Comments:       Anesthesia Quick Evaluation

## 2020-12-08 NOTE — Progress Notes (Signed)
Pre Procedure note for inpatients:   Bradley Stokes has been scheduled for Procedure(s): LAPAROSCOPIC ASSISTED POSSIBLE OPEN PARTIAL COLECTOMY WITH POSSIBLE OSTOMY (N/A) today. The various methods of treatment have been discussed with the patient. After consideration of the risks, benefits and treatment options the patient has consented to the planned procedure.   The patient has been seen and labs reviewed. There are no changes in the patient's condition to prevent proceeding with the planned procedure today.  Recent labs:  Lab Results  Component Value Date   WBC 7.6 12/08/2020   HGB 8.1 (L) 12/08/2020   HCT 26.0 (L) 12/08/2020   PLT 307 12/08/2020   GLUCOSE 90 12/08/2020   CHOL 131 12/21/2019   TRIG 120 12/21/2019   HDL 48 12/21/2019   LDLCALC 62 12/21/2019   ALT 9 12/07/2020   AST 17 12/07/2020   NA 139 12/08/2020   K 3.7 12/08/2020   CL 108 12/08/2020   CREATININE 1.79 (H) 12/08/2020   BUN 11 12/08/2020   CO2 23 12/08/2020   TSH 1.450 06/12/2020   INR 1.2 12/08/2020    Mickeal Skinner, MD 12/08/2020 1:00 PM

## 2020-12-08 NOTE — Transfer of Care (Signed)
Immediate Anesthesia Transfer of Care Note  Patient: Bradley Stokes  Procedure(s) Performed: LAPAROSCOPIC ASSISTED POSSIBLE OPEN PARTIAL COLECTOMY WITH POSSIBLE OSTOMY LAPAROSCOPIC LOW ANTERIOR RESCECTION WITH COLOANAL ANASTOMOSIS  Patient Location: PACU  Anesthesia Type:General  Level of Consciousness: drowsy and patient cooperative  Airway & Oxygen Therapy: Patient Spontanous Breathing and Patient connected to nasal cannula oxygen  Post-op Assessment: Report given to RN and Post -op Vital signs reviewed and stable  Post vital signs: Reviewed and stable  Last Vitals:  Vitals Value Taken Time  BP 118/52 12/08/20 1746  Temp    Pulse 67 12/08/20 1748  Resp 20 12/08/20 1748  SpO2 96 % 12/08/20 1748  Vitals shown include unvalidated device data.  Last Pain:  Vitals:   12/08/20 1209  TempSrc: Oral  PainSc: 0-No pain      Patients Stated Pain Goal: 3 (98/92/11 9417)  Complications: No notable events documented.

## 2020-12-08 NOTE — Anesthesia Postprocedure Evaluation (Signed)
Anesthesia Post Note  Patient: Bradley Stokes  Procedure(s) Performed: LAPAROSCOPIC ASSISTED POSSIBLE OPEN PARTIAL COLECTOMY WITH POSSIBLE OSTOMY LAPAROSCOPIC LOW ANTERIOR RESCECTION WITH COLOANAL ANASTOMOSIS     Patient location during evaluation: PACU Anesthesia Type: General Level of consciousness: awake and alert Pain management: pain level controlled Vital Signs Assessment: post-procedure vital signs reviewed and stable Respiratory status: spontaneous breathing, nonlabored ventilation, respiratory function stable and patient connected to nasal cannula oxygen Cardiovascular status: blood pressure returned to baseline and stable Postop Assessment: no apparent nausea or vomiting Anesthetic complications: no   No notable events documented.  Last Vitals:  Vitals:   12/08/20 1800 12/08/20 1815  BP: (!) 132/52 (!) 124/51  Pulse: 66 66  Resp: 19 18  Temp:  36.6 C  SpO2: 96% 95%    Last Pain:  Vitals:   12/08/20 1815  TempSrc:   PainSc: 3                  Effie Berkshire

## 2020-12-08 NOTE — Progress Notes (Signed)
OT Cancellation Note  Patient Details Name: Bradley Stokes MRN: [id number] DOB: [birth year]   Cancelled Treatment:    Reason Eval/Treat Not Completed: Other (comment). Pt. Requests to hold on skilled therapy today as he has sx. Pending in a few hours and wants to rest.    Clearnce Sorrel Lorraine-COTA/L 12/08/2020, 9:41 AM

## 2020-12-09 ENCOUNTER — Encounter (HOSPITAL_COMMUNITY): Payer: Self-pay | Admitting: General Surgery

## 2020-12-09 LAB — BASIC METABOLIC PANEL
Anion gap: 11 (ref 5–15)
BUN: 14 mg/dL (ref 8–23)
CO2: 18 mmol/L — ABNORMAL LOW (ref 22–32)
Calcium: 8.5 mg/dL — ABNORMAL LOW (ref 8.9–10.3)
Chloride: 111 mmol/L (ref 98–111)
Creatinine, Ser: 1.9 mg/dL — ABNORMAL HIGH (ref 0.61–1.24)
GFR, Estimated: 36 mL/min — ABNORMAL LOW (ref >60)
Glucose, Bld: 105 mg/dL — ABNORMAL HIGH (ref 70–99)
Potassium: 4.9 mmol/L (ref 3.5–5.1)
Sodium: 140 mmol/L (ref 135–145)

## 2020-12-09 LAB — CBC
HCT: 27.7 % — ABNORMAL LOW (ref 39.0–52.0)
Hemoglobin: 8.1 g/dL — ABNORMAL LOW (ref 13.0–17.0)
MCH: 30.8 pg (ref 26.0–34.0)
MCHC: 29.2 g/dL — ABNORMAL LOW (ref 30.0–36.0)
MCV: 105.3 fL — ABNORMAL HIGH (ref 80.0–100.0)
Platelets: 367 10*3/uL (ref 150–400)
RBC: 2.63 MIL/uL — ABNORMAL LOW (ref 4.22–5.81)
RDW: 14.3 % (ref 11.5–15.5)
WBC: 10.8 10*3/uL — ABNORMAL HIGH (ref 4.0–10.5)
nRBC: 0 % (ref 0.0–0.2)

## 2020-12-09 LAB — PROTIME-INR
INR: 1.1 (ref 0.8–1.2)
Prothrombin Time: 14 seconds (ref 11.4–15.2)

## 2020-12-09 MED ORDER — ENSURE SURGERY PO LIQD
237.0000 mL | Freq: Two times a day (BID) | ORAL | Status: DC
  Filled 2020-12-09 (×6): qty 237

## 2020-12-09 MED ORDER — SODIUM CHLORIDE 0.45 % IV SOLN: INTRAVENOUS | Status: DC

## 2020-12-09 MED ORDER — ENOXAPARIN SODIUM 80 MG/0.8ML IJ SOSY
70.0000 mg | PREFILLED_SYRINGE | INTRAMUSCULAR | Status: DC
  Administered 2020-12-09 – 2020-12-11 (×3): 70 mg via SUBCUTANEOUS
  Filled 2020-12-09 (×3): qty 0.8

## 2020-12-09 MED ORDER — ONDANSETRON HCL 4 MG/2ML IJ SOLN
4.0000 mg | Freq: Four times a day (QID) | INTRAMUSCULAR | Status: DC | PRN
  Administered 2020-12-10: 4 mg via INTRAVENOUS
  Filled 2020-12-09: qty 2

## 2020-12-09 MED ORDER — MORPHINE SULFATE (PF) 2 MG/ML IV SOLN
2.0000 mg | INTRAVENOUS | Status: DC | PRN
  Administered 2020-12-10: 4 mg via INTRAVENOUS
  Filled 2020-12-09: qty 2

## 2020-12-09 MED ORDER — ONDANSETRON HCL 4 MG PO TABS: 4.0000 mg | ORAL_TABLET | Freq: Four times a day (QID) | ORAL | Status: DC | PRN

## 2020-12-09 MED ORDER — METOPROLOL TARTRATE 5 MG/5ML IV SOLN: 5.0000 mg | Freq: Four times a day (QID) | INTRAVENOUS | Status: DC | PRN

## 2020-12-09 MED ORDER — CHLORHEXIDINE GLUCONATE CLOTH 2 % EX PADS
6.0000 | MEDICATED_PAD | Freq: Every day | CUTANEOUS | Status: DC
  Administered 2020-12-10: 6 via TOPICAL

## 2020-12-09 MED ORDER — OXYCODONE HCL 5 MG PO TABS: 5.0000 mg | ORAL_TABLET | Freq: Four times a day (QID) | ORAL | Status: DC | PRN

## 2020-12-09 NOTE — Progress Notes (Signed)
PROGRESS NOTE                                                                                                                                                                                                             Patient Demographics:    Bradley Stokes, is a 75 y.o. male, DOB - 1946/02/17, SVX:[id number]  Outpatient Primary MD for the patient is [name], [name], MD    LOS - 6  Admit date - 12/02/2020    Chief Complaint  Patient presents with   Rectal Bleeding       Brief Narrative    Bradley Stokes is a 75 y.o. male with medical history significant of hypertension, hyperlipidemia, paroxysmal atrial fibrillation on Coumadin, osteoarthritis, and obesity who presents with complaints of bleeding rectally, he was noted to have INR of 2.9, H&H was stable he was admitted to the hospital for further blood per rectum work-up.  Patient went for endoscopy/colonoscopy 9/7, which was significant for malignant 5 cm tumor in the proximal descending colon which was biopsied and was significant for malignancy.  Significant events -9/7 EGD/colonoscopy -9/10 lap extended right colectomy with ileocolonic anastomosis creation by Dr. Kieth Brightly and Dr. Radene Knee   Subjective:   Patient denies nausea or vomiting, reports some soreness in the abdomen, did not pass any gas.     Assessment  & Plan :     Acute lower GI bleed/acute blood loss anemia due to colonic mass.  . -Patient on warfarin, which has been held, he received vitamin K as well,  INR has normalized now.   -Continue to monitor H&H closely, so far no indication for transfusion.   -On Protonix.  Colonic mass/adenocarcinoma -Status post EGD/colonoscopy 9/7 significant for 5 cm colon mass in proximal descending colon. -Biopsy significant for invasive moderately differentiated adenocarcinoma. -CEA is normal 3.4 -CT abdomen pelvis significant for 2 subcentimeter hypodense  hepatic lesion, characterized, and single 1.3 cm lymph node posterior to colonic lesion, likely local nodal involvement, no additional pathology to the enlarged abdominal or pelvic lymph nodes. -9/10 lap extended right colectomy with ileocolonic anastomosis creation by Dr. Kieth Brightly and Dr. Radene Knee -Postoperative management per general surgery, he is on clear light diet today.  AKI on CKD 3B  - Baseline creatinine close to 2, peaked at 2.4, improving with holding Lasix, and gentle IV hydration.  Paroxysmal A. fib with CHA2DS2-VASc 2 score of greater than 3.  Continue amiodarone for rate control. -Warfarin remains on hold, held initially due to bleeding: Mass, and remains on hold postoperatively, he can be started on heparin GTT for anticoagulation once cleared by general surgery    Morbid obesity.  BMI of 41.  Follow with PCP for weight loss.  Chronic diastolic CHF last known EF is 55% in 2016.  Has a chronic leg edema continue Lasix held for bowel prep and mild AKI. Added TED stockings.  Dyslipidemia - statin.           Condition -  Guarded  Family Communication  :  None present at bedside , patient NOK , his sister is hospitalized as well.  Code Status :  Full  Consults  :  GI, General surgery  PUD Prophylaxis : PPI  DVT Prophylaxis  : Further on hold, started on Fenox dose DVT prophylaxis on 9/11.   Procedures  :      -9/7 EGD/colonoscopy -9/10 lap extended right colectomy with ileocolonic anastomosis creation by Dr. Kieth Brightly and Dr. Radene Knee      Disposition Plan  :    Status is: Inpatient  Remains inpatient appropriate because:IV treatments appropriate due to intensity of illness or inability to take PO  Dispo: The patient is from: Home              Anticipated d/c is to: Home likely will need SNF.              Patient currently is not medically stable to d/c.   Difficult to place patient No    Lab Results  Component Value Date   PLT 367 12/09/2020     Diet :  Diet Order             Diet clear liquid Room service appropriate? Yes; Fluid consistency: Thin  Diet effective now                    Inpatient Medications  Scheduled Meds:  allopurinol  100 mg Oral Daily   amiodarone  100 mg Oral Daily   ammonium lactate  1 application Topical BID   atorvastatin  20 mg Oral Daily   calcitRIOL  0.5 mcg Oral Daily   enoxaparin (LOVENOX) injection  70 mg Subcutaneous Q24H   feeding supplement  237 mL Oral BID BM   pantoprazole  40 mg Oral Daily   potassium chloride SA  20 mEq Oral Daily   sodium chloride flush  3 mL Intravenous Q12H   Continuous Infusions:  sodium chloride 75 mL/hr at 12/09/20 1146    PRN Meds:.acetaminophen **OR** acetaminophen, albuterol, metoprolol tartrate, morphine injection, ondansetron **OR** ondansetron (ZOFRAN) IV, oxyCODONE  Antibiotics  :    Anti-infectives (From admission, onward)    Start     Dose/Rate Route Frequency Ordered Stop   12/07/20 1445  cefoTEtan (CEFOTAN) 2 g in sodium chloride 0.9 % 100 mL IVPB        2 g 200 mL/hr over 30 Minutes Intravenous On call to O.R. 12/07/20 1355 12/08/20 0559   12/07/20 0600  cefoTEtan (CEFOTAN) 2 g in sodium chloride 0.9 % 100 mL IVPB  Status:  Discontinued        2 g 200 mL/hr over 30 Minutes Intravenous On call to O.R. 12/06/20 1436 12/07/20 2042   12/06/20 2200  neomycin (MYCIFRADIN) tablet 1,000 mg       See Hyperspace for full Linked  Orders Report.   1,000 mg Oral 3 times per day 12/06/20 1436 12/07/20 1614   12/06/20 2200  metroNIDAZOLE (FLAGYL) tablet 1,000 mg       See Hyperspace for full Linked Orders Report.   1,000 mg Oral 3 times per day 12/06/20 1436 12/07/20 1444          Bradley Stokes M.D on 12/09/2020 at 2:53 PM  To page go to [email or web address]   [facility] -  Office  [phone]    See all Orders from today for further details    Objective:   Vitals:   12/09/20 0353 12/09/20 0545 12/09/20 0729 12/09/20 1211   BP: (!) 132/47  (!) 155/56 (!) 143/53  Pulse: 84 78 88 90  Resp: 19 20 20 20   Temp: 97.7 F (36.5 C)  98.7 F (37.1 C) 97.8 F (36.6 C)  TempSrc: Oral  Axillary Oral  SpO2: 94% 92% 92%   Weight:  (!) 138.7 kg    Height:        Wt Readings from Last 3 Encounters:  12/09/20 (!) 138.7 kg  06/12/20 130.2 kg  12/21/19 135.5 kg     Intake/Output Summary (Last 24 hours) at 12/09/2020 1453 Last data filed at 12/09/2020 1100 Gross per 24 hour  Intake 3459.38 ml  Output 1500 ml  Net 1959.38 ml     Physical Exam  Awake Alert, Oriented X 3, No new F.N deficits, Normal affect Symmetrical Chest wall movement, Good air movement bilaterally, CTAB RRR,No Gallops,Rubs or new Murmurs, No Parasternal Heave Abdomen soft, nondistended, bowel sounds present, some post expected tenderness postoperatively, incision with dressing C/D/I Lower extremity skin changes, some trace edema.           Data Review:    CBC Recent Labs  Lab 12/04/20 0202 12/05/20 0033 12/06/20 0144 12/07/20 0255 12/08/20 0026 12/09/20 0555  WBC 8.8 12.4* 9.4 9.9 7.6 10.8*  HGB 8.8* 9.5* 8.2* 8.4* 8.1* 8.1*  HCT 27.9* 30.2* 26.0* 27.1* 26.0* 27.7*  PLT 296 343 297 302 307 367  MCV 99.3 100.3* 100.8* 101.5* 102.0* 105.3*  MCH 31.3 31.6 31.8 31.5 31.8 30.8  MCHC 31.5 31.5 31.5 31.0 31.2 29.2*  RDW 13.8 13.6 13.7 14.0 14.3 14.3  LYMPHSABS 1.9 1.5 1.3 1.1  --   --   MONOABS 1.0 1.2* 0.8 1.1*  --   --   EOSABS 0.3 0.2 0.5 0.3  --   --   BASOSABS 0.1 0.1 0.0 0.0  --   --     Recent Labs  Lab 12/04/20 0202 12/05/20 0033 12/06/20 0144 12/07/20 0255 12/08/20 0026 12/09/20 0555  NA 140 138 140 137 139 140  K 3.5 3.8 3.7 3.8 3.7 4.9  CL 104 103 106 105 108 111  CO2 26 26 25 26 23  18*  GLUCOSE 86 95 82 113* 90 105*  BUN 20 20 19 14 11 14   CREATININE 2.38* 2.26* 2.10* 1.95* 1.79* 1.90*  CALCIUM 8.5* 9.1 8.6* 8.5* 8.3* 8.5*  AST 15 21 21 17   --   --   ALT 6 8 8 9   --   --   ALKPHOS 99 104 93 101   --   --   BILITOT 1.5* 1.7* 1.3* 1.2  --   --   ALBUMIN 3.0* 3.3* 2.8* 2.7*  --   --   MG 2.1 2.2 2.1 2.0  --   --   INR 2.3* 1.4* 1.2 1.2 1.2 1.1  BNP 23.2 23.7 79.8  80.7  --   --     ------------------------------------------------------------------------------------------------------------------ No results for input(s): CHOL, HDL, LDLCALC, TRIG, CHOLHDL, LDLDIRECT in the last 72 hours.  No results found for: HGBA1C ------------------------------------------------------------------------------------------------------------------ No results for input(s): TSH, T4TOTAL, T3FREE, THYROIDAB in the last 72 hours.  Invalid input(s): FREET3  Cardiac Enzymes No results for input(s): CKMB, TROPONINI, MYOGLOBIN in the last 168 hours.  Invalid input(s): CK ------------------------------------------------------------------------------------------------------------------    Component Value Date/Time   BNP 80.7 12/07/2020 0255     Radiology Reports CT CHEST ABDOMEN PELVIS W CONTRAST  Result Date: 12/06/2020 CLINICAL DATA:  Colon cancer, initial staging. EXAM: CT CHEST, ABDOMEN, AND PELVIS WITH CONTRAST TECHNIQUE: Multidetector CT imaging of the chest, abdomen and pelvis was performed following the standard protocol during bolus administration of intravenous contrast. CONTRAST:  42mL OMNIPAQUE IOHEXOL 350 MG/ML SOLN COMPARISON:  No imaging available for comparison at time of dictation. FINDINGS: CT CHEST FINDINGS Cardiovascular: Aortic and branch vessel atherosclerosis without aneurysmal dilation. Three-vessel coronary artery calcifications. Normal size heart. No significant pericardial effusion/thickening. No central pulmonary embolus. Mediastinum/Nodes: No supraclavicular adenopathy. No discrete thyroid nodule. No pathologically enlarged mediastinal, hilar or axillary lymph nodes. The trachea and esophagus are unremarkable. Lungs/Pleura: No suspicious pulmonary nodules or masses. Bibasilar  atelectasis. No pleural effusion. No pneumothorax. Musculoskeletal: Healing left lateral eighth and ninth rib fractures, without associated lesion to suggest pathologic fracture. No aggressive lytic or blastic lesion of bone. CT ABDOMEN PELVIS FINDINGS Hepatobiliary: Hypodense 7 mm lesion in the left lobe of the liver on image 48/3 and a hypodense 4 mm lesion in the right lobe of the liver on image 49/3, incompletely characterized on this examination. Gallbladder is unremarkable. No biliary ductal dilation. Pancreas: No pancreatic ductal dilatation or surrounding inflammatory changes. Spleen: Within normal limits. Adrenals/Urinary Tract: Bilateral adrenal glands are unremarkable. No hydronephrosis. Bilateral renal cortical thinning. Urinary bladder is unremarkable for degree of distension. Stomach/Bowel: Stomach is decompressed. Periampullary duodenal diverticulum. No pathologic dilation of small or large bowel. Radiopaque enteric contrast traverses the sigmoid colon. Short segment of colonic wall thickening involving the splenic flexure for instance on image 64/3 and 28/4 likely reflects patient's presumed colonic malignancy. Left-sided colonic diverticulosis without findings of acute diverticulitis. Vascular/Lymphatic: Aorta bi-iliac atherosclerotic calcifications without abdominal aortic aneurysm. 1.3 cm lymph node posterior to the colonic lesion on image 61/3 likely reflecting local nodal involvement. No additional pathologically enlarged abdominal or pelvic lymph nodes. Reproductive: Prostate is unremarkable. Other: No significant abdominopelvic ascites. Nonspecific body wall edema. Musculoskeletal: Multilevel degenerative changes spine. No aggressive lytic or blastic lesion of bone. IMPRESSION: 1. Short segment of colonic wall thickening involving the splenic flexure likely reflects patient's presumed colonic malignancy. 2. Single 1.3 cm lymph node posterior to the colonic lesion likely reflecting local nodal  involvement. No additional pathologically enlarged abdominal or pelvic lymph nodes to suggest distant nodal metastases. 3. Two subcentimeter hypodense hepatic lesions, incompletely characterized on this examination. Possibly reflecting benign hepatic cysts but cystic hepatic metastases not excluded on this study. Consider attention on close interval follow-up imaging versus further evaluation with hepatic protocol MRI with and without contrast, preferably as an outpatient when patient is stable and able to follow commands. 4. Healing nonpathologic left lateral eighth and ninth rib fractures. 5. Aortic Atherosclerosis (ICD10-I70.0). Electronically Signed   By: Dahlia Bailiff M.D.   On: 12/06/2020 20:56   DG CHEST PORT 1 VIEW  Result Date: 12/02/2020 CLINICAL DATA:  Chronic atrial fibrillation. Reported history of rectal bleeding. EXAM: PORTABLE CHEST 1 VIEW COMPARISON:  Chest radiograph dated 07/10/2016. FINDINGS: The heart size is enlarged. Vascular calcifications are seen in the aortic arch. The left costophrenic angle is obscured which may reflect pericardial fat versus a pleural effusion. The right lung is clear and there is no right pleural effusion. There is no pneumothorax. The visualized skeletal structures are unremarkable. IMPRESSION: Cardiomegaly. Obscured left costophrenic angle may represent pericardial fat versus a pleural effusion. Aortic Atherosclerosis (ICD10-I70.0). Electronically Signed   By: Zerita Boers M.D.   On: 12/02/2020 15:11

## 2020-12-09 NOTE — Progress Notes (Signed)
Palliative Medicine Inpatient Follow Up Note   Consulting Provider: Jesusita Oka, MD   Reason for consult:   [facility] Palliative Medicine Consult  Reason for Consult?      HPI:  Per intake H&P --> Bradley Stokes is a 75 y.o. male with medical history significant of hypertension, hyperlipidemia, paroxysmal atrial fibrillation on Coumadin, osteoarthritis, and obesity who presents with complaints of bleeding rectally, he was noted to have INR of 2.9, H&H was stable he was admitted to the hospital for further blood per rectum work-up.  Patient went for endoscopy/colonoscopy 9/7, which was significant for malignant 5 cm tumor in the proximal descending colon which was biopsied.  Palliative care was asked to get involved to further broach goals of care conversations.  Today's Discussion (12/09/2020 ):  *Please note that this is a verbal dictation therefore any spelling or grammatical errors are due to the "[place] [facility]" system interpretation.  Chart reviewed. Bradley Stokes's surgery took place yesterday - s/p lap extended right colectomy with ileocolonic anastomosis creation.  I met with Bradley Stokes this morning. He endorses some abdominal pain though reviews splinting exercises his night nurse encouraged him to do with a pillow over his abdomen which has been helping. Bradley Stokes is anxious to speak to the surgical team regarding the results of the surgery and how much they are able to resect of his colon mass. Otherwise, Bradley Stokes reports feeling hungry and wants, "mellow-yellow and a [place] ham biscuit". We discussed that it will be a process to get him to a point of a full diet again and that this is up to the surgical team.  Otherwise, Bradley Stokes shares with me his hopes for the future and ongoing improvements. I provided emotional support through therapeutic listening.  Questions/concerns addressed palliative support provided.  Objective Assessment: Vital  Signs Vitals:   12/09/20 0545 12/09/20 0729  BP:  (!) 155/56  Pulse: 78 88  Resp: 20 20  Temp:  98.7 F (37.1 C)  SpO2: 92% 92%    Intake/Output Summary (Last 24 hours) at 12/09/2020 0957 Last data filed at 12/09/2020 0400 Gross per 24 hour  Intake 3739.38 ml  Output 975 ml  Net 2764.38 ml    Last Weight  Most recent update: 12/09/2020  5:47 AM    Weight  138.7 kg (305 lb 12.5 oz)              Gen: Obese older caucasian M in NAD HEENT: moist mucous membranes CV: Regular rate and irregular rhythm  PULM: On 1LPM [place] ABD: soft/nontender, (+) abd dressings EXT: (+) LE edema  Neuro: A+O x3  SUMMARY OF RECOMMENDATIONS   DNAR/DNI   MOST Completed/DNR Form Completed, paper copy placed onto the chart electric copy can be found in [place] Completed   Will need to await biopsy to see what further therapies would involve   TOC - OP Palliative support   Patient would like to be placed in a SNF with his sister if possible   Personal goals are to be as independent as possible for as long as possible   Ongoing support incrementally while Bradley Stokes is hospitalized  Time Spent: 35 Greater than 50% of the time was spent in counseling and coordination of care ______________________________________________________________________________________ [place] Team Team Cell Phone: [phone] Please utilize secure chat with additional questions, if there is no response within 30 minutes please call the above phone number  Palliative Medicine Team providers  are available by phone from 7am to 7pm daily and can be reached through the team cell phone.  Should this patient require assistance outside of these hours, please call the patient's attending physician.

## 2020-12-09 NOTE — Progress Notes (Signed)
Progress Note  1 Day Post-Op  Subjective: CC: some abdominal soreness He denies nausea or emesis. He has not passed flatus or BM. Pain well controlled   Objective: Vital signs in last 24 hours: Temp:  [96.4 F (35.8 C)-98.7 F (37.1 C)] 98.7 F (37.1 C) (09/11 0729) Pulse Rate:  [66-88] 88 (09/11 0729) Resp:  [16-20] 20 (09/11 0729) BP: (118-167)/(47-69) 155/56 (09/11 0729) SpO2:  [91 %-100 %] 92 % (09/11 0729) Weight:  [130.6 kg-138.7 kg] 138.7 kg (09/11 0545) Last BM Date: 12/07/20  Intake/Output from previous day: 09/10 0701 - 09/11 0700 In: 3739.4 [I.V.:3739.4] Out: 975 [Urine:925; Blood:50] Intake/Output this shift: No intake/output data recorded.  PE: General: pleasant, WD, male who is laying in bed in NAD HEENT: head is normocephalic, atraumatic.  Mouth is pink and moist Heart:  Palpable radial pulses bilaterally Lungs:  Respiratory effort nonlabored Abd: soft, ND, +BS, mild expected post operative tenderness to palpation. Incisions with dressings c/d/I - no surrounding erythema MSK: all 4 extremities are symmetrical with no cyanosis, clubbing, or edema. No calf TTP bilaterally Skin: warm and dry with no masses, lesions, or rashes Psych: A&Ox3 with an appropriate affect.    Lab Results:  Recent Labs    12/08/20 0026 12/09/20 0555  WBC 7.6 10.8*  HGB 8.1* 8.1*  HCT 26.0* 27.7*  PLT 307 367   BMET Recent Labs    12/08/20 0026 12/09/20 0555  NA 139 140  K 3.7 4.9  CL 108 111  CO2 23 18*  GLUCOSE 90 105*  BUN 11 14  CREATININE 1.79* 1.90*  CALCIUM 8.3* 8.5*   PT/INR Recent Labs    12/08/20 0026 12/09/20 0555  LABPROT 14.9 14.0  INR 1.2 1.1   CMP     Component Value Date/Time   NA 140 12/09/2020 0555   NA 142 06/12/2020 1126   K 4.9 12/09/2020 0555   CL 111 12/09/2020 0555   CO2 18 (L) 12/09/2020 0555   GLUCOSE 105 (H) 12/09/2020 0555   BUN 14 12/09/2020 0555   BUN 18 06/12/2020 1126   CREATININE 1.90 (H) 12/09/2020 0555    CREATININE 1.79 (H) 01/01/2015 1234   CALCIUM 8.5 (L) 12/09/2020 0555   PROT 5.7 (L) 12/07/2020 0255   PROT 6.5 06/12/2020 1126   ALBUMIN 2.7 (L) 12/07/2020 0255   ALBUMIN 3.9 06/12/2020 1126   AST 17 12/07/2020 0255   ALT 9 12/07/2020 0255   ALKPHOS 101 12/07/2020 0255   BILITOT 1.2 12/07/2020 0255   BILITOT 0.6 06/12/2020 1126   GFRNONAA 36 (L) 12/09/2020 0555   GFRAA 38 (L) 12/21/2019 1155   Lipase  No results found for: LIPASE     Studies/Results: No results found.  Anti-infectives: Anti-infectives (From admission, onward)    Start     Dose/Rate Route Frequency Ordered Stop   12/07/20 1445  cefoTEtan (CEFOTAN) 2 g in sodium chloride 0.9 % 100 mL IVPB        2 g 200 mL/hr over 30 Minutes Intravenous On call to O.R. 12/07/20 1355 12/08/20 0559   12/07/20 0600  cefoTEtan (CEFOTAN) 2 g in sodium chloride 0.9 % 100 mL IVPB  Status:  Discontinued        2 g 200 mL/hr over 30 Minutes Intravenous On call to O.R. 12/06/20 1436 12/07/20 2042   12/06/20 2200  neomycin (MYCIFRADIN) tablet 1,000 mg       See Hyperspace for full Linked Orders Report.   1,000 mg Oral 3 times per  day 12/06/20 1436 12/07/20 1614   12/06/20 2200  metroNIDAZOLE (FLAGYL) tablet 1,000 mg       See Hyperspace for full Linked Orders Report.   1,000 mg Oral 3 times per day 12/06/20 1436 12/07/20 1444        Assessment/Plan Proximal descending colon mass POD1 s/p lap extended right colectomy with ileocolonic anastomosis creation by Dr. Kieth Brightly and Dr. Radene Knee 9/10 - colonoscopy bx with adenocarcinoma. Surgical path pending - imaging with CT chest/abd/pelvis with IV contrast 9/8 without evidence of metastatic disease -  Hgb 8.1 - continue to monitor - monitor bowel function and start CLD - will discuss with MD timing of resuming coumadin   FEN: CLD ID: [email or web address] periop VTE: hold coumadin, lovenox   Atrial fibrillation - coumadin HTN HLD     LOS: 6 days    Winferd Humphrey, [facility] 12/09/2020, 9:30 AM Please see Amion for pager number during day hours 7:00am-4:30pm

## 2020-12-10 ENCOUNTER — Inpatient Hospital Stay (HOSPITAL_COMMUNITY): Payer: Medicare HMO

## 2020-12-10 ENCOUNTER — Encounter (HOSPITAL_COMMUNITY): Payer: Self-pay | Admitting: Internal Medicine

## 2020-12-10 DIAGNOSIS — D630 Anemia in neoplastic disease: Secondary | ICD-10-CM

## 2020-12-10 DIAGNOSIS — R109 Unspecified abdominal pain: Secondary | ICD-10-CM

## 2020-12-10 DIAGNOSIS — K922 Gastrointestinal hemorrhage, unspecified: Secondary | ICD-10-CM | POA: Diagnosis not present

## 2020-12-10 DIAGNOSIS — C779 Secondary and unspecified malignant neoplasm of lymph node, unspecified: Secondary | ICD-10-CM | POA: Diagnosis not present

## 2020-12-10 DIAGNOSIS — D649 Anemia, unspecified: Secondary | ICD-10-CM

## 2020-12-10 DIAGNOSIS — C186 Malignant neoplasm of descending colon: Principal | ICD-10-CM

## 2020-12-10 DIAGNOSIS — I5032 Chronic diastolic (congestive) heart failure: Secondary | ICD-10-CM

## 2020-12-10 DIAGNOSIS — E538 Deficiency of other specified B group vitamins: Secondary | ICD-10-CM

## 2020-12-10 DIAGNOSIS — N183 Chronic kidney disease, stage 3 unspecified: Secondary | ICD-10-CM

## 2020-12-10 DIAGNOSIS — R1110 Vomiting, unspecified: Secondary | ICD-10-CM

## 2020-12-10 DIAGNOSIS — I4891 Unspecified atrial fibrillation: Secondary | ICD-10-CM

## 2020-12-10 DIAGNOSIS — C185 Malignant neoplasm of splenic flexure: Secondary | ICD-10-CM

## 2020-12-10 DIAGNOSIS — Z7901 Long term (current) use of anticoagulants: Secondary | ICD-10-CM

## 2020-12-10 DIAGNOSIS — Z79899 Other long term (current) drug therapy: Secondary | ICD-10-CM

## 2020-12-10 LAB — CBC WITH DIFFERENTIAL/PLATELET
Abs Immature Granulocytes: 0.17 10*3/uL — ABNORMAL HIGH (ref 0.00–0.07)
Basophils Absolute: 0 10*3/uL (ref 0.0–0.1)
Basophils Relative: 0 %
Eosinophils Absolute: 0 10*3/uL (ref 0.0–0.5)
Eosinophils Relative: 0 %
HCT: 32.8 % — ABNORMAL LOW (ref 39.0–52.0)
Hemoglobin: 10.2 g/dL — ABNORMAL LOW (ref 13.0–17.0)
Immature Granulocytes: 1 %
Lymphocytes Relative: 7 %
Lymphs Abs: 0.8 10*3/uL (ref 0.7–4.0)
MCH: 31.4 pg (ref 26.0–34.0)
MCHC: 31.1 g/dL (ref 30.0–36.0)
MCV: 100.9 fL — ABNORMAL HIGH (ref 80.0–100.0)
Monocytes Absolute: 1.1 10*3/uL — ABNORMAL HIGH (ref 0.1–1.0)
Monocytes Relative: 9 %
Neutro Abs: 9.8 10*3/uL — ABNORMAL HIGH (ref 1.7–7.7)
Neutrophils Relative %: 83 %
Platelets: 382 10*3/uL (ref 150–400)
RBC: 3.25 MIL/uL — ABNORMAL LOW (ref 4.22–5.81)
RDW: 15.7 % — ABNORMAL HIGH (ref 11.5–15.5)
WBC: 12 10*3/uL — ABNORMAL HIGH (ref 4.0–10.5)
nRBC: 0.2 % (ref 0.0–0.2)

## 2020-12-10 LAB — PROTIME-INR
INR: 1.1 (ref 0.8–1.2)
Prothrombin Time: 14.4 seconds (ref 11.4–15.2)

## 2020-12-10 LAB — CBC
HCT: 25.3 % — ABNORMAL LOW (ref 39.0–52.0)
Hemoglobin: 7.7 g/dL — ABNORMAL LOW (ref 13.0–17.0)
MCH: 31.3 pg (ref 26.0–34.0)
MCHC: 30.4 g/dL (ref 30.0–36.0)
MCV: 102.8 fL — ABNORMAL HIGH (ref 80.0–100.0)
Platelets: 363 10*3/uL (ref 150–400)
RBC: 2.46 MIL/uL — ABNORMAL LOW (ref 4.22–5.81)
RDW: 14.2 % (ref 11.5–15.5)
WBC: 10.7 10*3/uL — ABNORMAL HIGH (ref 4.0–10.5)
nRBC: 0 % (ref 0.0–0.2)

## 2020-12-10 LAB — PHOSPHORUS: Phosphorus: 2.3 mg/dL — ABNORMAL LOW (ref 2.5–4.6)

## 2020-12-10 LAB — BASIC METABOLIC PANEL
Anion gap: 7 (ref 5–15)
BUN: 17 mg/dL (ref 8–23)
CO2: 23 mmol/L (ref 22–32)
Calcium: 8.6 mg/dL — ABNORMAL LOW (ref 8.9–10.3)
Chloride: 109 mmol/L (ref 98–111)
Creatinine, Ser: 1.92 mg/dL — ABNORMAL HIGH (ref 0.61–1.24)
GFR, Estimated: 35 mL/min — ABNORMAL LOW (ref >60)
Glucose, Bld: 122 mg/dL — ABNORMAL HIGH (ref 70–99)
Potassium: 4.2 mmol/L (ref 3.5–5.1)
Sodium: 139 mmol/L (ref 135–145)

## 2020-12-10 LAB — PREPARE RBC (CROSSMATCH)

## 2020-12-10 MED ORDER — CYANOCOBALAMIN 1000 MCG/ML IJ SOLN
1000.0000 ug | Freq: Every day | INTRAMUSCULAR | Status: AC
  Administered 2020-12-11 – 2020-12-12 (×2): 1000 ug via SUBCUTANEOUS
  Filled 2020-12-10 (×3): qty 1

## 2020-12-10 MED ORDER — INFLUENZA VAC A&B SA ADJ QUAD 0.5 ML IM PRSY
0.5000 mL | PREFILLED_SYRINGE | INTRAMUSCULAR | Status: AC
  Administered 2020-12-11: 0.5 mL via INTRAMUSCULAR
  Filled 2020-12-10: qty 0.5

## 2020-12-10 MED ORDER — GADOBUTROL 1 MMOL/ML IV SOLN
10.0000 mL | Freq: Once | INTRAVENOUS | Status: AC | PRN
  Administered 2020-12-10: 10 mL via INTRAVENOUS

## 2020-12-10 MED ORDER — SODIUM CHLORIDE 0.9% IV SOLUTION: Freq: Once | INTRAVENOUS | Status: DC

## 2020-12-10 MED ORDER — BOOST / RESOURCE BREEZE PO LIQD CUSTOM
1.0000 | Freq: Three times a day (TID) | ORAL | Status: DC
  Administered 2020-12-11: 1 via ORAL

## 2020-12-10 MED ORDER — SODIUM CHLORIDE 0.9 % IV SOLN
12.5000 mg | Freq: Four times a day (QID) | INTRAVENOUS | Status: DC | PRN
  Administered 2020-12-10: 12.5 mg via INTRAVENOUS
  Filled 2020-12-10: qty 0.5

## 2020-12-10 NOTE — Progress Notes (Signed)
Initial Nutrition Assessment  DOCUMENTATION CODES:   Morbid obesity  INTERVENTION:  Provide Boost Breeze po TID, each supplement provides 250 kcal and 9 grams of protein  Continue Ensure Surgery po BID, each supplement provides 330 kcal and 18 grams of protein.   Encourage PO intake.   NUTRITION DIAGNOSIS:   Inadequate oral intake related to nausea, poor appetite as evidenced by meal completion < 50%.  GOAL:   Patient will meet greater than or equal to 90% of their needs  MONITOR:   Diet advancement, Supplement acceptance, PO intake, Labs, Weight trends, Skin, I & O's  REASON FOR ASSESSMENT:   NPO/Clear Liquid Diet    ASSESSMENT:   75 y.o. male with medical history significant of hypertension, hyperlipidemia, paroxysmal atrial fibrillation on Coumadin, osteoarthritis, and obesity who presents with complaints of bleeding rectally. Endoscopy/colonoscopy 9/7, which was significant for malignant 5 cm tumor in the proximal descending colon which was biopsied and was significant for malignancy. 9/10 lap extended right colectomy with ileocolonic anastomosis  Diet advanced to a clear liquid diet yesterday. Pt reports good po yesterday, however experiencing abdominal pains and nausea today which has been causing poor appetite and po. Pt reports eating well PTA with no difficulties. Pt currently has Ensure surgery and has been refusing them. RD to order Boost Breeze to aid in caloric and protein needs. Pt encouraged to consume his food at meals and to drink his supplements.   NUTRITION - FOCUSED PHYSICAL EXAM:  Flowsheet Row Most Recent Value  Orbital Region No depletion  Upper Arm Region No depletion  Thoracic and Lumbar Region No depletion  Buccal Region No depletion  Temple Region No depletion  Clavicle Bone Region No depletion  Clavicle and Acromion Bone Region No depletion  Scapular Bone Region Unable to assess  Dorsal Hand Unable to assess  Patellar Region No depletion   Anterior Thigh Region No depletion  Posterior Calf Region No depletion  Edema (RD Assessment) Moderate  Hair Reviewed  Eyes Reviewed  Mouth Reviewed  Skin Reviewed  Nails Reviewed      Labs and medications reviewed.   Diet Order:   Diet Order             Diet clear liquid Room service appropriate? Yes; Fluid consistency: Thin  Diet effective now                   EDUCATION NEEDS:   Not appropriate for education at this time  Skin:  Skin Assessment: Skin Integrity Issues: Skin Integrity Issues:: Incisions Incisions: abdomen  Last BM:  9/9  Height:   Ht Readings from Last 1 Encounters:  12/08/20 5\' 9"  (1.753 m)    Weight:   Wt Readings from Last 1 Encounters:  12/10/20 125.4 kg   BMI:  Body mass index is 40.83 kg/m.  Estimated Nutritional Needs:   Kcal:  2100-2300  Protein:  115-130 grams  Fluid:  >/= 2 L/day  Corrin Parker, MS, RD, LDN RD pager number/after hours weekend pager number on Amion.

## 2020-12-10 NOTE — Progress Notes (Signed)
PROGRESS NOTE                                                                                                                                                                                                             Patient Demographics:    Bradley Stokes, is a 75 y.o. male, DOB - 1945/12/03, MWU:[id number]  Outpatient Primary MD for the patient is [name], [name], MD    LOS - 7  Admit date - 12/02/2020    Chief Complaint  Patient presents with   Rectal Bleeding       Brief Narrative    Bradley Stokes is a 75 y.o. male with medical history significant of hypertension, hyperlipidemia, paroxysmal atrial fibrillation on Coumadin, osteoarthritis, and obesity who presents with complaints of bleeding rectally, he was noted to have INR of 2.9, H&H was stable he was admitted to the hospital for further blood per rectum work-up.  Patient went for endoscopy/colonoscopy 9/7, which was significant for malignant 5 cm tumor in the proximal descending colon which was biopsied and was significant for malignancy.  Significant events -9/7 EGD/colonoscopy -9/10 lap extended right colectomy with ileocolonic anastomosis creation by Dr. Kieth Brightly and Dr. Radene Knee   Subjective:   Reports some soreness at abdomen, he did report some nausea, he had some dry heaving as well, he had small smear on the chair, but he not recall him passing gas himself.   Assessment  & Plan :     Acute lower GI bleed/acute blood loss anemia due to colonic mass.  . -Patient on warfarin, which has been held, he received vitamin K as well,  INR has normalized now.   -On Protonix. -Hemoglobin 7.7 this morning, will transfuse 1 unit PRBC.  Colonic mass/adenocarcinoma -Status post EGD/colonoscopy 9/7 significant for 5 cm colon mass in proximal descending colon. -Biopsy significant for invasive moderately differentiated adenocarcinoma. -CEA is normal 3.4 -CT  abdomen pelvis significant for 2 subcentimeter hypodense hepatic lesion, characterized, and single 1.3 cm lymph node posterior to colonic lesion, likely local nodal involvement, no additional pathology to the enlarged abdominal or pelvic lymph nodes.  As well finding of possible benign hepatic cyst, but cystic hepatic metastasis could not be excluded. -9/10 lap extended right colectomy with ileocolonic anastomosis creation by Dr. Kieth Brightly and Dr. Radene Knee -Postoperative management per general surgery, he is on  clear light diet today. -Oncology consulted  AKI on CKD 3B  - Baseline creatinine close to 2, peaked at 2.4, improving with holding Lasix, hold IV fluids.  Paroxysmal A. fib with CHA2DS2-VASc 2 score of greater than 3.  Continue amiodarone for rate control. -Warfarin remains on hold, held initially due to bleeding from  Mass, and remains on hold postoperatively, he can be started on heparin GTT for anticoagulation once cleared by general surgery    Morbid obesity.  BMI of 41.  Follow with PCP for weight loss.  Chronic diastolic CHF last known EF is 55% in 2016.  Has a chronic leg edema continue Lasix held for bowel prep and mild AKI. Added TED stockings.  Dyslipidemia - statin.           Condition -  Guarded  Family Communication  :  None present at bedside , patient NOK , his sister is hospitalized as well.  Code Status :  Full  Consults  :  GI, General surgery  PUD Prophylaxis : PPI  DVT Prophylaxis  : Further on hold, started on Fenox dose DVT prophylaxis on 9/11.   Procedures  :      -9/7 EGD/colonoscopy -9/10 lap extended right colectomy with ileocolonic anastomosis creation by Dr. Kieth Brightly and Dr. Radene Knee      Disposition Plan  :    Status is: Inpatient  Remains inpatient appropriate because:IV treatments appropriate due to intensity of illness or inability to take PO  Dispo: The patient is from: Home              Anticipated d/c is to: Home likely will need  SNF.              Patient currently is not medically stable to d/c.   Difficult to place patient No    Lab Results  Component Value Date   PLT 363 12/10/2020    Diet :  Diet Order             Diet clear liquid Room service appropriate? Yes; Fluid consistency: Thin  Diet effective now                    Inpatient Medications  Scheduled Meds:  sodium chloride   Intravenous Once   allopurinol  100 mg Oral Daily   amiodarone  100 mg Oral Daily   ammonium lactate  1 application Topical BID   atorvastatin  20 mg Oral Daily   calcitRIOL  0.5 mcg Oral Daily   Chlorhexidine Gluconate Cloth  6 each Topical Daily   enoxaparin (LOVENOX) injection  70 mg Subcutaneous Q24H   feeding supplement  237 mL Oral BID BM   pantoprazole  40 mg Oral Daily   sodium chloride flush  3 mL Intravenous Q12H   Continuous Infusions:    PRN Meds:.acetaminophen **OR** acetaminophen, albuterol, metoprolol tartrate, morphine injection, ondansetron **OR** ondansetron (ZOFRAN) IV, oxyCODONE  Antibiotics  :    Anti-infectives (From admission, onward)    Start     Dose/Rate Route Frequency Ordered Stop   12/07/20 1445  cefoTEtan (CEFOTAN) 2 g in sodium chloride 0.9 % 100 mL IVPB        2 g 200 mL/hr over 30 Minutes Intravenous On call to O.R. 12/07/20 1355 12/08/20 0559   12/07/20 0600  cefoTEtan (CEFOTAN) 2 g in sodium chloride 0.9 % 100 mL IVPB  Status:  Discontinued        2 g 200 mL/hr over 30  Minutes Intravenous On call to O.R. 12/06/20 1436 12/07/20 2042   12/06/20 2200  neomycin (MYCIFRADIN) tablet 1,000 mg       See Hyperspace for full Linked Orders Report.   1,000 mg Oral 3 times per day 12/06/20 1436 12/07/20 1614   12/06/20 2200  metroNIDAZOLE (FLAGYL) tablet 1,000 mg       See Hyperspace for full Linked Orders Report.   1,000 mg Oral 3 times per day 12/06/20 1436 12/07/20 1444          Lynne Takemoto M.D on 12/10/2020 at 12:47 PM  To page go to [email or web address]   [facility] -  Office  [phone]    See all Orders from today for further details    Objective:   Vitals:   12/10/20 0849 12/10/20 1052 12/10/20 1135 12/10/20 1136  BP: (!) 159/65 (!) 165/70 (!) 166/68   Pulse: 73 78  81  Resp: 20 16  15   Temp: 98 F (36.7 C) 97.9 F (36.6 C)  98 F (36.7 C)  TempSrc: Oral Oral  Oral  SpO2: 91% 95%  95%  Weight:      Height:        Wt Readings from Last 3 Encounters:  12/10/20 125.4 kg  06/12/20 130.2 kg  12/21/19 135.5 kg     Intake/Output Summary (Last 24 hours) at 12/10/2020 1247 Last data filed at 12/10/2020 0700 Gross per 24 hour  Intake 2546.45 ml  Output 500 ml  Net 2046.45 ml     Physical Exam  Awake Alert, Oriented X 3, No new F.N deficits, Normal affect Symmetrical Chest wall movement, Good air movement bilaterally, CTAB RRR,No Gallops,Rubs or new Murmurs, No Parasternal Heave Abdomen soft, bowel sounds present, incisions with dressing C/D/I Lower extremity skin changes, some trace edema.           Data Review:    CBC Recent Labs  Lab 12/04/20 0202 12/05/20 0033 12/06/20 0144 12/07/20 0255 12/08/20 0026 12/09/20 0555 12/10/20 0027  WBC 8.8 12.4* 9.4 9.9 7.6 10.8* 10.7*  HGB 8.8* 9.5* 8.2* 8.4* 8.1* 8.1* 7.7*  HCT 27.9* 30.2* 26.0* 27.1* 26.0* 27.7* 25.3*  PLT 296 343 297 302 307 367 363  MCV 99.3 100.3* 100.8* 101.5* 102.0* 105.3* 102.8*  MCH 31.3 31.6 31.8 31.5 31.8 30.8 31.3  MCHC 31.5 31.5 31.5 31.0 31.2 29.2* 30.4  RDW 13.8 13.6 13.7 14.0 14.3 14.3 14.2  LYMPHSABS 1.9 1.5 1.3 1.1  --   --   --   MONOABS 1.0 1.2* 0.8 1.1*  --   --   --   EOSABS 0.3 0.2 0.5 0.3  --   --   --   BASOSABS 0.1 0.1 0.0 0.0  --   --   --     Recent Labs  Lab 12/04/20 0202 12/05/20 0033 12/06/20 0144 12/07/20 0255 12/08/20 0026 12/09/20 0555 12/10/20 0027  NA 140 138 140 137 139 140 139  K 3.5 3.8 3.7 3.8 3.7 4.9 4.2  CL 104 103 106 105 108 111 109  CO2 26 26 25 26 23  18* 23  GLUCOSE 86 95 82 113*  90 105* 122*  BUN 20 20 19 14 11 14 17   CREATININE 2.38* 2.26* 2.10* 1.95* 1.79* 1.90* 1.92*  CALCIUM 8.5* 9.1 8.6* 8.5* 8.3* 8.5* 8.6*  AST 15 21 21 17   --   --   --   ALT 6 8 8 9   --   --   --  ALKPHOS 99 104 93 101  --   --   --   BILITOT 1.5* 1.7* 1.3* 1.2  --   --   --   ALBUMIN 3.0* 3.3* 2.8* 2.7*  --   --   --   MG 2.1 2.2 2.1 2.0  --   --   --   INR 2.3* 1.4* 1.2 1.2 1.2 1.1 1.1  BNP 23.2 23.7 79.8 80.7  --   --   --     ------------------------------------------------------------------------------------------------------------------ No results for input(s): CHOL, HDL, LDLCALC, TRIG, CHOLHDL, LDLDIRECT in the last 72 hours.  No results found for: HGBA1C ------------------------------------------------------------------------------------------------------------------ No results for input(s): TSH, T4TOTAL, T3FREE, THYROIDAB in the last 72 hours.  Invalid input(s): FREET3  Cardiac Enzymes No results for input(s): CKMB, TROPONINI, MYOGLOBIN in the last 168 hours.  Invalid input(s): CK ------------------------------------------------------------------------------------------------------------------    Component Value Date/Time   BNP 80.7 12/07/2020 0255     Radiology Reports CT CHEST ABDOMEN PELVIS W CONTRAST  Result Date: 12/06/2020 CLINICAL DATA:  Colon cancer, initial staging. EXAM: CT CHEST, ABDOMEN, AND PELVIS WITH CONTRAST TECHNIQUE: Multidetector CT imaging of the chest, abdomen and pelvis was performed following the standard protocol during bolus administration of intravenous contrast. CONTRAST:  79mL OMNIPAQUE IOHEXOL 350 MG/ML SOLN COMPARISON:  No imaging available for comparison at time of dictation. FINDINGS: CT CHEST FINDINGS Cardiovascular: Aortic and branch vessel atherosclerosis without aneurysmal dilation. Three-vessel coronary artery calcifications. Normal size heart. No significant pericardial effusion/thickening. No central pulmonary embolus.  Mediastinum/Nodes: No supraclavicular adenopathy. No discrete thyroid nodule. No pathologically enlarged mediastinal, hilar or axillary lymph nodes. The trachea and esophagus are unremarkable. Lungs/Pleura: No suspicious pulmonary nodules or masses. Bibasilar atelectasis. No pleural effusion. No pneumothorax. Musculoskeletal: Healing left lateral eighth and ninth rib fractures, without associated lesion to suggest pathologic fracture. No aggressive lytic or blastic lesion of bone. CT ABDOMEN PELVIS FINDINGS Hepatobiliary: Hypodense 7 mm lesion in the left lobe of the liver on image 48/3 and a hypodense 4 mm lesion in the right lobe of the liver on image 49/3, incompletely characterized on this examination. Gallbladder is unremarkable. No biliary ductal dilation. Pancreas: No pancreatic ductal dilatation or surrounding inflammatory changes. Spleen: Within normal limits. Adrenals/Urinary Tract: Bilateral adrenal glands are unremarkable. No hydronephrosis. Bilateral renal cortical thinning. Urinary bladder is unremarkable for degree of distension. Stomach/Bowel: Stomach is decompressed. Periampullary duodenal diverticulum. No pathologic dilation of small or large bowel. Radiopaque enteric contrast traverses the sigmoid colon. Short segment of colonic wall thickening involving the splenic flexure for instance on image 64/3 and 28/4 likely reflects patient's presumed colonic malignancy. Left-sided colonic diverticulosis without findings of acute diverticulitis. Vascular/Lymphatic: Aorta bi-iliac atherosclerotic calcifications without abdominal aortic aneurysm. 1.3 cm lymph node posterior to the colonic lesion on image 61/3 likely reflecting local nodal involvement. No additional pathologically enlarged abdominal or pelvic lymph nodes. Reproductive: Prostate is unremarkable. Other: No significant abdominopelvic ascites. Nonspecific body wall edema. Musculoskeletal: Multilevel degenerative changes spine. No aggressive  lytic or blastic lesion of bone. IMPRESSION: 1. Short segment of colonic wall thickening involving the splenic flexure likely reflects patient's presumed colonic malignancy. 2. Single 1.3 cm lymph node posterior to the colonic lesion likely reflecting local nodal involvement. No additional pathologically enlarged abdominal or pelvic lymph nodes to suggest distant nodal metastases. 3. Two subcentimeter hypodense hepatic lesions, incompletely characterized on this examination. Possibly reflecting benign hepatic cysts but cystic hepatic metastases not excluded on this study. Consider attention on close interval follow-up imaging versus further evaluation with  hepatic protocol MRI with and without contrast, preferably as an outpatient when patient is stable and able to follow commands. 4. Healing nonpathologic left lateral eighth and ninth rib fractures. 5. Aortic Atherosclerosis (ICD10-I70.0). Electronically Signed   By: Dahlia Bailiff M.D.   On: 12/06/2020 20:56   DG CHEST PORT 1 VIEW  Result Date: 12/02/2020 CLINICAL DATA:  Chronic atrial fibrillation. Reported history of rectal bleeding. EXAM: PORTABLE CHEST 1 VIEW COMPARISON:  Chest radiograph dated 07/10/2016. FINDINGS: The heart size is enlarged. Vascular calcifications are seen in the aortic arch. The left costophrenic angle is obscured which may reflect pericardial fat versus a pleural effusion. The right lung is clear and there is no right pleural effusion. There is no pneumothorax. The visualized skeletal structures are unremarkable. IMPRESSION: Cardiomegaly. Obscured left costophrenic angle may represent pericardial fat versus a pleural effusion. Aortic Atherosclerosis (ICD10-I70.0). Electronically Signed   By: Zerita Boers M.D.   On: 12/02/2020 15:11

## 2020-12-10 NOTE — Progress Notes (Signed)
Physical Therapy Treatment Patient Details Name: Bradley Stokes MRN: [id number] DOB: [birth year] Today's Date: 12/10/2020   History of Present Illness Pt is a 75yo male presenting [facility] ED on 9/4 reporting dark stools, likely secondary to GI bleed. Chest xray showed cardiomegaly.  PMH: HTN, PAF, gout, CHF, LE edema.    PT Comments    Pt received in bed, reporting fatigue/lethargy due to not sleeping well last night. He required min assist bed mobility, mod assist sit to stand, and min assist ambulation 2' with RW for pivot bed to recliner. Pt with c/o bilat knee pain during ambulation. Requesting Tylenol before any future therapy sessions. Pt in recliner with feet elevated at end of session.    Recommendations for follow up therapy are one component of a multi-disciplinary discharge planning process, led by the attending physician.  Recommendations may be updated based on patient status, additional functional criteria and insurance authorization.  Follow Up Recommendations  SNF;Supervision for mobility/OOB     Equipment Recommendations  3in1 (PT);Rolling walker with 5" wheels (bariatric)    Recommendations for Other Services       Precautions / Restrictions Precautions Precautions: Fall Precaution Comments: Pt reports a fall ~1 month ago where he fell into a RW on his left side and still reports some L knee swelling.     Mobility  Bed Mobility Overal bed mobility: Needs Assistance Bed Mobility: Supine to Sit     Supine to sit: Min assist;HOB elevated     General bed mobility comments: +rail, increased time, assist to elevate trunk    Transfers Overall transfer level: Needs assistance Equipment used: Rolling walker (2 wheeled) Transfers: Sit to/from Stand Sit to Stand: Mod assist;From elevated surface         General transfer comment: rocking for momentum to power up, increased time to achieve full stance and stabilize balance.  Ambulation/Gait Ambulation/Gait  assistance: Min assist Gait Distance (Feet): 2 Feet Assistive device: Rolling walker (2 wheeled) Gait Pattern/deviations: Step-through pattern;Antalgic;Decreased stride length Gait velocity: decreased   General Gait Details: Pivot steps with RW bed to recliner. Distance limited by bilat knee pain and fatigue.   Stairs             Wheelchair Mobility    Modified Rankin (Stroke Patients Only)       Balance Overall balance assessment: Needs assistance Sitting-balance support: No upper extremity supported;Feet supported Sitting balance-Leahy Scale: Good     Standing balance support: Bilateral upper extremity supported;During functional activity Standing balance-Leahy Scale: Poor Standing balance comment: reliant on external assist                            Cognition Arousal/Alertness: Awake/alert Behavior During Therapy: WFL for tasks assessed/performed Overall Cognitive Status: No family/caregiver present to determine baseline cognitive functioning                                        Exercises      General Comments General comments (skin integrity, edema, etc.): In bed soiled with BM on arrival. Pt unaware. Dependent with pericare/hygiene standing with RW.      Pertinent Vitals/Pain Pain Assessment: Faces Faces Pain Scale: Hurts little more Pain Location: bilat knees during amb Pain Descriptors / Indicators: Discomfort;Sore Pain Intervention(s): Limited activity within patient's tolerance;Monitored during session;Repositioned;Patient requesting pain meds-RN notified    Home  Living                      Prior Function            PT Goals (current goals can now be found in the care plan section) Acute Rehab PT Goals Patient Stated Goal: to go home Progress towards PT goals: Progressing toward goals    Frequency    Min 2X/week      PT Plan Current plan remains appropriate    Co-evaluation               AM-PAC PT "6 Clicks" Mobility   Outcome Measure  Help needed turning from your back to your side while in a flat bed without using bedrails?: A Little Help needed moving from lying on your back to sitting on the side of a flat bed without using bedrails?: A Little Help needed moving to and from a bed to a chair (including a wheelchair)?: A Little Help needed standing up from a chair using your arms (e.g., wheelchair or bedside chair)?: A Lot Help needed to walk in hospital room?: A Lot Help needed climbing 3-5 steps with a railing? : Total 6 Click Score: 14    End of Session Equipment Utilized During Treatment: Gait belt Activity Tolerance: Patient limited by pain;Patient limited by fatigue Patient left: in chair;with call bell/phone within reach Nurse Communication: Mobility status;Patient requests pain meds PT Visit Diagnosis: History of falling (Z91.81);Muscle weakness (generalized) (M62.81);Other abnormalities of gait and mobility (R26.89)     Time: 0626-9485 PT Time Calculation (min) (ACUTE ONLY): 24 min  Charges:  $Gait Training: 8-22 mins $Therapeutic Activity: 8-22 mins                     Lorrin Goodell, PT  Office # [phone] Pager [phone]    Lorriane Shire 12/10/2020, 9:44 AM

## 2020-12-10 NOTE — Progress Notes (Addendum)
2 Days Post-Op  Subjective: CC: Having some soreness around his midline incision especially when he moves. Nauseated this morning so did not drink much of his cld tray. No flatus. Small smear this am on chair when he got up to go back in bed. Foley in place still. We discussed the pathology results from his colonoscopy.   Objective: Vital signs in last 24 hours: Temp:  [97.8 F (36.6 C)-98.6 F (37 C)] 98 F (36.7 C) (09/12 0849) Pulse Rate:  [73-90] 73 (09/12 0849) Resp:  [20-24] 20 (09/12 0849) BP: (142-159)/(53-65) 159/65 (09/12 0849) SpO2:  [89 %-95 %] 91 % (09/12 0849) Weight:  [125.4 kg] 125.4 kg (09/12 0617) Last BM Date: 12/07/20  Intake/Output from previous day: 09/11 0701 - 09/12 0700 In: 2666.5 [P.O.:460; I.V.:2206.5] Out: 1025 [Urine:1025] Intake/Output this shift: No intake/output data recorded.  PE: Gen:  Alert, NAD, pleasant Pulm: Normal rate and effort  Abd: Soft, mild distension, appropriately tender, +BS, incision w/ dressings in place - c/d/I.  Psych: A&Ox3  Skin: no rashes noted, warm and dry  Lab Results:  Recent Labs    12/09/20 0555 12/10/20 0027  WBC 10.8* 10.7*  HGB 8.1* 7.7*  HCT 27.7* 25.3*  PLT 367 363   BMET Recent Labs    12/09/20 0555 12/10/20 0027  NA 140 139  K 4.9 4.2  CL 111 109  CO2 18* 23  GLUCOSE 105* 122*  BUN 14 17  CREATININE 1.90* 1.92*  CALCIUM 8.5* 8.6*   PT/INR Recent Labs    12/09/20 0555 12/10/20 0027  LABPROT 14.0 14.4  INR 1.1 1.1   CMP     Component Value Date/Time   NA 139 12/10/2020 0027   NA 142 06/12/2020 1126   K 4.2 12/10/2020 0027   CL 109 12/10/2020 0027   CO2 23 12/10/2020 0027   GLUCOSE 122 (H) 12/10/2020 0027   BUN 17 12/10/2020 0027   BUN 18 06/12/2020 1126   CREATININE 1.92 (H) 12/10/2020 0027   CREATININE 1.79 (H) 01/01/2015 1234   CALCIUM 8.6 (L) 12/10/2020 0027   PROT 5.7 (L) 12/07/2020 0255   PROT 6.5 06/12/2020 1126   ALBUMIN 2.7 (L) 12/07/2020 0255   ALBUMIN 3.9  06/12/2020 1126   AST 17 12/07/2020 0255   ALT 9 12/07/2020 0255   ALKPHOS 101 12/07/2020 0255   BILITOT 1.2 12/07/2020 0255   BILITOT 0.6 06/12/2020 1126   GFRNONAA 35 (L) 12/10/2020 0027   GFRAA 38 (L) 12/21/2019 1155   Lipase  No results found for: LIPASE  Studies/Results: No results found.  Anti-infectives: Anti-infectives (From admission, onward)    Start     Dose/Rate Route Frequency Ordered Stop   12/07/20 1445  cefoTEtan (CEFOTAN) 2 g in sodium chloride 0.9 % 100 mL IVPB        2 g 200 mL/hr over 30 Minutes Intravenous On call to O.R. 12/07/20 1355 12/08/20 0559   12/07/20 0600  cefoTEtan (CEFOTAN) 2 g in sodium chloride 0.9 % 100 mL IVPB  Status:  Discontinued        2 g 200 mL/hr over 30 Minutes Intravenous On call to O.R. 12/06/20 1436 12/07/20 2042   12/06/20 2200  neomycin (MYCIFRADIN) tablet 1,000 mg       See Hyperspace for full Linked Orders Report.   1,000 mg Oral 3 times per day 12/06/20 1436 12/07/20 1614   12/06/20 2200  metroNIDAZOLE (FLAGYL) tablet 1,000 mg       See Hyperspace  for full Linked Orders Report.   1,000 mg Oral 3 times per day 12/06/20 1436 12/07/20 1444        Assessment/Plan POD 2 s/p lap extended right colectomy with ileocolonic anastomosis creation on Proximal descending colon mass by Dr. Kieth Brightly and Dr. Radene Knee 9/10 - Colonoscopy bx with adenocarcinoma. Surgical path pending.  - CEA wnl at 3.4 - CT chest/abd/pelvis with IV contrast 9/8 with single 1.3 cm lymph node posterior to the colonic lesion likely reflecting local nodal involvement. This also showed suspect benign hepatic cysts but cystic hepatic metastases could not be excluded - Discussed with Dr. Barry Dienes. Recommends obtaining MRI liver w/ & w/o IV contrast ( - Will need oncology consult during admission. Will await final path as this will be helpful to determine if patient will need chemo - Keep on CLD today given nausea and await return of bowel function - Mobilize - PT rec  SNF - Pulm toilet - Hold anticoagulation until can get recs from onc on if patient will need port   FEN: CLD ID: [id number] periop VTE: Lovenox  Foley - d/c. TOV   ABL anemia  Atrial fibrillation HTN HLD   LOS: 7 days    Jillyn Ledger , [facility] Surgery 12/10/2020, 10:38 AM Please see Amion for pager number during day hours 7:00am-4:30pm

## 2020-12-10 NOTE — Consult Note (Addendum)
[place]  [phone] [phone]   MEDICAL ONCOLOGY - INITIAL CONSULTATION  Referral MD: Dr. Phillips Climes   Reason for Referral: Colon adenocarcinoma  HPI: Bradley Stokes is a 75 year old male with a past medical history significant for hypertension, hyperlipidemia, paroxysmal atrial fibrillation on Coumadin, osteoarthritis, and obesity.  Who presents to the emergency department with hematochezia.  He had a 1 week history of intermittent bleeding from his rectum.  Lab work on admission showed a hemoglobin of 9.7 (prior hemoglobin was 13.1 on 06/12/2020).  His creatinine was 2.07, calcium 8.8, total protein 6.4, albumin 3.1, AST 14.  His PT was 30.4 and INR was 2.9.  Vitamin B12 level was 51 on admission.  The patient was seen by GI and had an EGD on 12/05/2020 which was normal.  He also had a colonoscopy on 12/05/2020 which showed an ulcerated nonobstructing large mass in the proximal descending colon which was biopsied.  He was seen by general surgery and underwent laparoscopic extended right colectomy on 12/08/2020.  Biopsy from the colonoscopy showed invasive moderately differentiated adenocarcinoma.  Surgical pathology from his colectomy is still pending.  A CEA was obtained on 12/05/2020 and was normal at 3.4.  A CT of the chest/abdomen/pelvis with contrast was obtained on 12/06/2020 which showed colonic wall thickening which likely reflects the patient's presumed colonic malignancy, a single 1.3 cm lymph node posterior to the colonic lesion likely reflecting local nodal involvement, 2 subcentimeter hypodense hepatic lesions which were incompletely characterized on the CT scan.  I met with Bradley Stokes in his hospital room.  There is no family at the bedside.  He tells me that he was having increased abdominal pain with vomiting this morning.  He was given pain medication just prior to my arrival and states that he "feels drowsy."  His pain is better at this time.  He is not  having any active nausea or vomiting at this time.  He tells me that his appetite has been decreased prior to admission.  He states that he has lost some weight but is not sure how much weight he has lost.  He is not having any headaches, dizziness, chest pain, shortness of breath.  He reports that he has moved his bowels since surgery.  He is not sure if he is having any more bleeding.  The patient tells me that he is single.  He was living with his sister prior to admission.  He tells me that his sister is being placed in a nursing home when he will likely go to a nursing home as well.  He does not have any children.  He denies history of alcohol and tobacco use.  He tells me that he has several family members with cancer but does not know any further details.  Medical oncology was asked to see the patient to make recommendations regarding his colon adenocarcinoma.  Past Medical History:  Diagnosis Date   Arthritis    severe arthritis and deformity of his knee's   Chest pain    Chronic atrial fibrillation (HCC)    Chronic renal disease, stage III (HCC)    Chronic venous stasis dermatitis    Edema    Gout    History of cardiovascular stress test 01/17/2008   EF- 62%   Hypercholesterolemia    Hyperlipidemia    Hypertension    Kidney stones    Long term (current) use of anticoagulants    Long-term (current) use of anticoagulants  Obesity    Osteoarthritis    PAF (paroxysmal atrial fibrillation) (HCC)    Paroxysmal atrial fibrillation (HCC)    Secondary hypercoagulable state ([place])   :   Past Surgical History:  Procedure Laterality Date   BIOPSY  12/05/2020   Procedure: BIOPSY;  Surgeon: [name], MD;  Location: [facility] ENDOSCOPY;  Service: Endoscopy;;   CARDIOLITE STRESS TEST     EF 62%   COLONOSCOPY N/A 12/05/2020   Procedure: COLONOSCOPY;  Surgeon: [name], MD;  Location: [facility] ENDOSCOPY;  Service: Endoscopy;  Laterality: N/A;   ESOPHAGOGASTRODUODENOSCOPY (EGD) WITH PROPOFOL N/A  12/05/2020   Procedure: ESOPHAGOGASTRODUODENOSCOPY (EGD) WITH PROPOFOL;  Surgeon: [name], MD;  Location: [facility] ENDOSCOPY;  Service: Endoscopy;  Laterality: N/A;   LAPAROSCOPIC LOW ANTERIOR RESCECTION WITH COLOANAL ANASTOMOSIS  12/08/2020   Procedure: LAPAROSCOPIC LOW ANTERIOR RESCECTION WITH COLOANAL ANASTOMOSIS;  Surgeon: Kieth Brightly, Arta Bruce, MD;  Location: [place];  Service: General;;   LAPAROSCOPIC PARTIAL COLECTOMY N/A 12/08/2020   Procedure: LAPAROSCOPIC ASSISTED POSSIBLE OPEN PARTIAL COLECTOMY WITH POSSIBLE OSTOMY;  Surgeon: Kieth Brightly, Arta Bruce, MD;  Location: [place];  Service: General;  Laterality: N/A;   POLYPECTOMY  12/05/2020   Procedure: POLYPECTOMY;  Surgeon: [name], MD;  Location: [place] ENDOSCOPY;  Service: Endoscopy;;   SUBMUCOSAL TATTOO INJECTION  12/05/2020   Procedure: SUBMUCOSAL TATTOO INJECTION;  Surgeon: [name], MD;  Location: [facility] ENDOSCOPY;  Service: Endoscopy;;  :   Current Facility-Administered Medications  Medication Dose Route Frequency Provider Last Rate Last Admin   0.9 %  sodium chloride infusion (Manually program via Guardrails IV Fluids)   Intravenous Once Elgergawy, Silver Huguenin, MD       acetaminophen (TYLENOL) tablet 650 mg  650 mg Oral Q6H PRN Kinsinger, Arta Bruce, MD   650 mg at 12/08/20 2006   Or   acetaminophen (TYLENOL) suppository 650 mg  650 mg Rectal Q6H PRN Kinsinger, Arta Bruce, MD       albuterol (PROVENTIL) (2.5 MG/3ML) 0.083% nebulizer solution 2.5 mg  2.5 mg Nebulization Q6H PRN Kinsinger, Arta Bruce, MD       allopurinol (ZYLOPRIM) tablet 100 mg  100 mg Oral Daily Kinsinger, Arta Bruce, MD   100 mg at 12/10/20 0955   amiodarone (PACERONE) tablet 100 mg  100 mg Oral Daily Kinsinger, Arta Bruce, MD   100 mg at 12/10/20 0954   ammonium lactate (LAC-HYDRIN) 12 % lotion 1 application  1 application Topical BID Kinsinger, Arta Bruce, MD   1 application at 16/10/96 0955   atorvastatin (LIPITOR) tablet 20 mg  20 mg Oral Daily Kinsinger, Arta Bruce, MD    20 mg at 12/10/20 0955   calcitRIOL (ROCALTROL) capsule 0.5 mcg  0.5 mcg Oral Daily Kinsinger, Arta Bruce, MD   0.5 mcg at 12/10/20 0955   Chlorhexidine Gluconate Cloth 2 % PADS 6 each  6 each Topical Daily Elgergawy, Silver Huguenin, MD       enoxaparin (LOVENOX) injection 70 mg  70 mg Subcutaneous Q24H Kinsinger, Arta Bruce, MD   70 mg at 12/10/20 1144   feeding supplement (ENSURE SURGERY) liquid 237 mL  237 mL Oral BID BM Kinsinger, Arta Bruce, MD       metoprolol tartrate (LOPRESSOR) injection 5 mg  5 mg Intravenous Q6H PRN Kinsinger, Arta Bruce, MD       morphine 2 MG/ML injection 2-4 mg  2-4 mg Intravenous Q3H PRN Kinsinger, Arta Bruce, MD       ondansetron [facility]) tablet 4 mg  4 mg Oral  Q6H PRN Kinsinger, Arta Bruce, MD       Or   ondansetron [facility]) injection 4 mg  4 mg Intravenous Q6H PRN Kinsinger, Arta Bruce, MD   4 mg at 12/10/20 0951   oxyCODONE (Oxy IR/ROXICODONE) immediate release tablet 5 mg  5 mg Oral Q6H PRN Kinsinger, Arta Bruce, MD       pantoprazole (PROTONIX) EC tablet 40 mg  40 mg Oral Daily Kinsinger, Arta Bruce, MD   40 mg at 12/10/20 0955   promethazine (PHENERGAN) 12.5 mg in sodium chloride 0.9 % 50 mL IVPB  12.5 mg Intravenous Q6H PRN Elgergawy, Silver Huguenin, MD       sodium chloride flush (NS) 0.9 % injection 3 mL  3 mL Intravenous Q12H Kinsinger, Arta Bruce, MD   3 mL at 12/10/20 0955      Allergies  Allergen Reactions   Other     Tape,needs paper tape  :   Family History  Problem Relation Age of Onset   Pneumonia Mother    Heart failure Mother    Kidney failure Sister    Heart disease Brother    Crohn's disease Sister   :   Social History   Socioeconomic History   Marital status: Married    Spouse name: Not on file   Number of children: Not on file   Years of education: Not on file   Highest education level: Not on file  Occupational History   Not on file  Tobacco Use   Smoking status: Never   Smokeless tobacco: Never  Vaping Use   Vaping Use:  Never used  Substance and Sexual Activity   Alcohol use: No   Drug use: No   Sexual activity: Not on file  Other Topics Concern   Not on file  Social History Narrative   Not on file   Social Determinants of Health   Financial Resource Strain: Not on file  Food Insecurity: Not on file  Transportation Needs: Not on file  Physical Activity: Not on file  Stress: Not on file  Social Connections: Not on file  Intimate Partner Violence: Not on file  :  Review of Systems: A comprehensive 14 point review of systems was negative except as noted in the HPI.  Exam: Patient Vitals for the past 24 hrs:  BP Temp Temp src Pulse Resp SpO2 Weight  12/10/20 1136 -- 98 F (36.7 C) Oral 81 15 95 % --  12/10/20 1135 (!) 166/68 -- -- -- -- -- --  12/10/20 1052 (!) 165/70 97.9 F (36.6 C) Oral 78 16 95 % --  12/10/20 0849 (!) 159/65 98 F (36.7 C) Oral 73 20 91 % --  12/10/20 0617 -- -- -- -- -- -- 125.4 kg  12/10/20 0335 (!) 153/59 98.6 F (37 C) Oral 81 (!) 24 (!) 89 % --  12/09/20 2322 (!) 152/63 98.4 F (36.9 C) Oral 85 (!) 23 90 % --  12/09/20 2003 (!) 150/60 98 F (36.7 C) Oral 85 (!) 22 95 % --  12/09/20 1619 (!) 142/54 98.2 F (36.8 C) Oral 86 (!) 22 90 % --    General:  well-nourished in no acute distress.   Eyes:  no scleral icterus.   ENT:  There were no oropharyngeal lesions.    Lymphatics:  Negative cervical, supraclavicular or axillary adenopathy.   Respiratory: lungs were clear bilaterally without wheezing or crackles.   Cardiovascular: Regular rate and rhythm, trace lower extremity edema. GI: Positive bowel  sounds, soft, mild tenderness to palpation, dressing without drainage. Musculoskeletal:  no spinal tenderness of palpation of vertebral spine.   Skin: Scattered ecchymoses Neuro exam was nonfocal. Patient was alert and oriented.  Attention was good.   Language was appropriate.  Mood was normal without depression.  Speech was not pressured.  Thought content was not  tangential.     Lab Results  Component Value Date   WBC 10.7 (H) 12/10/2020   HGB 7.7 (L) 12/10/2020   HCT 25.3 (L) 12/10/2020   PLT 363 12/10/2020   GLUCOSE 122 (H) 12/10/2020   CHOL 131 12/21/2019   TRIG 120 12/21/2019   HDL 48 12/21/2019   LDLCALC 62 12/21/2019   ALT 9 12/07/2020   AST 17 12/07/2020   NA 139 12/10/2020   K 4.2 12/10/2020   CL 109 12/10/2020   CREATININE 1.92 (H) 12/10/2020   BUN 17 12/10/2020   CO2 23 12/10/2020    CT CHEST ABDOMEN PELVIS W CONTRAST  Result Date: 12/06/2020 CLINICAL DATA:  Colon cancer, initial staging. EXAM: CT CHEST, ABDOMEN, AND PELVIS WITH CONTRAST TECHNIQUE: Multidetector CT imaging of the chest, abdomen and pelvis was performed following the standard protocol during bolus administration of intravenous contrast. CONTRAST:  49m OMNIPAQUE IOHEXOL 350 MG/ML SOLN COMPARISON:  No imaging available for comparison at time of dictation. FINDINGS: CT CHEST FINDINGS Cardiovascular: Aortic and branch vessel atherosclerosis without aneurysmal dilation. Three-vessel coronary artery calcifications. Normal size heart. No significant pericardial effusion/thickening. No central pulmonary embolus. Mediastinum/Nodes: No supraclavicular adenopathy. No discrete thyroid nodule. No pathologically enlarged mediastinal, hilar or axillary lymph nodes. The trachea and esophagus are unremarkable. Lungs/Pleura: No suspicious pulmonary nodules or masses. Bibasilar atelectasis. No pleural effusion. No pneumothorax. Musculoskeletal: Healing left lateral eighth and ninth rib fractures, without associated lesion to suggest pathologic fracture. No aggressive lytic or blastic lesion of bone. CT ABDOMEN PELVIS FINDINGS Hepatobiliary: Hypodense 7 mm lesion in the left lobe of the liver on image 48/3 and a hypodense 4 mm lesion in the right lobe of the liver on image 49/3, incompletely characterized on this examination. Gallbladder is unremarkable. No biliary ductal dilation. Pancreas:  No pancreatic ductal dilatation or surrounding inflammatory changes. Spleen: Within normal limits. Adrenals/Urinary Tract: Bilateral adrenal glands are unremarkable. No hydronephrosis. Bilateral renal cortical thinning. Urinary bladder is unremarkable for degree of distension. Stomach/Bowel: Stomach is decompressed. Periampullary duodenal diverticulum. No pathologic dilation of small or large bowel. Radiopaque enteric contrast traverses the sigmoid colon. Short segment of colonic wall thickening involving the splenic flexure for instance on image 64/3 and 28/4 likely reflects patient's presumed colonic malignancy. Left-sided colonic diverticulosis without findings of acute diverticulitis. Vascular/Lymphatic: Aorta bi-iliac atherosclerotic calcifications without abdominal aortic aneurysm. 1.3 cm lymph node posterior to the colonic lesion on image 61/3 likely reflecting local nodal involvement. No additional pathologically enlarged abdominal or pelvic lymph nodes. Reproductive: Prostate is unremarkable. Other: No significant abdominopelvic ascites. Nonspecific body wall edema. Musculoskeletal: Multilevel degenerative changes spine. No aggressive lytic or blastic lesion of bone. IMPRESSION: 1. Short segment of colonic wall thickening involving the splenic flexure likely reflects patient's presumed colonic malignancy. 2. Single 1.3 cm lymph node posterior to the colonic lesion likely reflecting local nodal involvement. No additional pathologically enlarged abdominal or pelvic lymph nodes to suggest distant nodal metastases. 3. Two subcentimeter hypodense hepatic lesions, incompletely characterized on this examination. Possibly reflecting benign hepatic cysts but cystic hepatic metastases not excluded on this study. Consider attention on close interval follow-up imaging versus further evaluation with hepatic protocol  MRI with and without contrast, preferably as an outpatient when patient is stable and able to follow  commands. 4. Healing nonpathologic left lateral eighth and ninth rib fractures. 5. Aortic Atherosclerosis (ICD10-I70.0). Electronically Signed   By: Dahlia Bailiff M.D.   On: 12/06/2020 20:56   DG CHEST PORT 1 VIEW  Result Date: 12/02/2020 CLINICAL DATA:  Chronic atrial fibrillation. Reported history of rectal bleeding. EXAM: PORTABLE CHEST 1 VIEW COMPARISON:  Chest radiograph dated 07/10/2016. FINDINGS: The heart size is enlarged. Vascular calcifications are seen in the aortic arch. The left costophrenic angle is obscured which may reflect pericardial fat versus a pleural effusion. The right lung is clear and there is no right pleural effusion. There is no pneumothorax. The visualized skeletal structures are unremarkable. IMPRESSION: Cardiomegaly. Obscured left costophrenic angle may represent pericardial fat versus a pleural effusion. Aortic Atherosclerosis (ICD10-I70.0). Electronically Signed   By: Zerita Boers M.D.   On: 12/02/2020 15:11     CT CHEST ABDOMEN PELVIS W CONTRAST  Result Date: 12/06/2020 CLINICAL DATA:  Colon cancer, initial staging. EXAM: CT CHEST, ABDOMEN, AND PELVIS WITH CONTRAST TECHNIQUE: Multidetector CT imaging of the chest, abdomen and pelvis was performed following the standard protocol during bolus administration of intravenous contrast. CONTRAST:  80m OMNIPAQUE IOHEXOL 350 MG/ML SOLN COMPARISON:  No imaging available for comparison at time of dictation. FINDINGS: CT CHEST FINDINGS Cardiovascular: Aortic and branch vessel atherosclerosis without aneurysmal dilation. Three-vessel coronary artery calcifications. Normal size heart. No significant pericardial effusion/thickening. No central pulmonary embolus. Mediastinum/Nodes: No supraclavicular adenopathy. No discrete thyroid nodule. No pathologically enlarged mediastinal, hilar or axillary lymph nodes. The trachea and esophagus are unremarkable. Lungs/Pleura: No suspicious pulmonary nodules or masses. Bibasilar atelectasis. No  pleural effusion. No pneumothorax. Musculoskeletal: Healing left lateral eighth and ninth rib fractures, without associated lesion to suggest pathologic fracture. No aggressive lytic or blastic lesion of bone. CT ABDOMEN PELVIS FINDINGS Hepatobiliary: Hypodense 7 mm lesion in the left lobe of the liver on image 48/3 and a hypodense 4 mm lesion in the right lobe of the liver on image 49/3, incompletely characterized on this examination. Gallbladder is unremarkable. No biliary ductal dilation. Pancreas: No pancreatic ductal dilatation or surrounding inflammatory changes. Spleen: Within normal limits. Adrenals/Urinary Tract: Bilateral adrenal glands are unremarkable. No hydronephrosis. Bilateral renal cortical thinning. Urinary bladder is unremarkable for degree of distension. Stomach/Bowel: Stomach is decompressed. Periampullary duodenal diverticulum. No pathologic dilation of small or large bowel. Radiopaque enteric contrast traverses the sigmoid colon. Short segment of colonic wall thickening involving the splenic flexure for instance on image 64/3 and 28/4 likely reflects patient's presumed colonic malignancy. Left-sided colonic diverticulosis without findings of acute diverticulitis. Vascular/Lymphatic: Aorta bi-iliac atherosclerotic calcifications without abdominal aortic aneurysm. 1.3 cm lymph node posterior to the colonic lesion on image 61/3 likely reflecting local nodal involvement. No additional pathologically enlarged abdominal or pelvic lymph nodes. Reproductive: Prostate is unremarkable. Other: No significant abdominopelvic ascites. Nonspecific body wall edema. Musculoskeletal: Multilevel degenerative changes spine. No aggressive lytic or blastic lesion of bone. IMPRESSION: 1. Short segment of colonic wall thickening involving the splenic flexure likely reflects patient's presumed colonic malignancy. 2. Single 1.3 cm lymph node posterior to the colonic lesion likely reflecting local nodal involvement. No  additional pathologically enlarged abdominal or pelvic lymph nodes to suggest distant nodal metastases. 3. Two subcentimeter hypodense hepatic lesions, incompletely characterized on this examination. Possibly reflecting benign hepatic cysts but cystic hepatic metastases not excluded on this study. Consider attention on close interval follow-up imaging versus further  evaluation with hepatic protocol MRI with and without contrast, preferably as an outpatient when patient is stable and able to follow commands. 4. Healing nonpathologic left lateral eighth and ninth rib fractures. 5. Aortic Atherosclerosis (ICD10-I70.0). Electronically Signed   By: Dahlia Bailiff M.D.   On: 12/06/2020 20:56   DG CHEST PORT 1 VIEW  Result Date: 12/02/2020 CLINICAL DATA:  Chronic atrial fibrillation. Reported history of rectal bleeding. EXAM: PORTABLE CHEST 1 VIEW COMPARISON:  Chest radiograph dated 07/10/2016. FINDINGS: The heart size is enlarged. Vascular calcifications are seen in the aortic arch. The left costophrenic angle is obscured which may reflect pericardial fat versus a pleural effusion. The right lung is clear and there is no right pleural effusion. There is no pneumothorax. The visualized skeletal structures are unremarkable. IMPRESSION: Cardiomegaly. Obscured left costophrenic angle may represent pericardial fat versus a pleural effusion. Aortic Atherosclerosis (ICD10-I70.0). Electronically Signed   By: Zerita Boers M.D.   On: 12/02/2020 15:11    Pathology:  SURGICAL PATHOLOGY  CASE: [id number]  PATIENT: Bradley Stokes  Surgical Pathology Report   Clinical History: hematochezia, anemia (cm)   FINAL MICROSCOPIC DIAGNOSIS:   A. COLON, CECAL, POLYPECTOMY:  - Tubular adenoma  - Negative for high-grade dysplasia or malignancy   B. COLON MASS, DESCENDING, BIOPSY:  - Invasive moderately differentiated adenocarcinoma, see comment    COMMENT:   Dr. Saralyn Pilar reviewed the case and concurs with the diagnosis.   Immunohistochemical stains for MMR-related proteins are pending and will  be reported in an addendum.   Assessment and Plan:  1.  Colon adenocarcinoma 2.  Hepatic lesions 3.  Macrocytic anemia 4.  AKI on CKD 5.  Paroxysmal atrial fibrillation 6.  Dyslipidemia 7.  Chronic diastolic CHF 8.  Morbid obesity 9.  Moderate anemia secondary to colon cancer, B12 deficiency, and surgery 10.  Severe B12 deficiency  -The patient has a new diagnosis of colon adenocarcinoma.  Surgical pathology report is still pending.  He has 2 subcentimeter hepatic lesions on CT scan which could be cyst versus hepatic mets.  An MRI of the liver has been ordered by general surgery to further characterize.  This will help with initial staging.  Once we have the MRI of the liver and surgical pathology results back, we can further comment regarding staging and treatment options. -The patient has macrocytic anemia.  He is noted to be B12 deficient.  He received 1 dose of vitamin B12 1000 mcg IM on 12/02/2020.  Recommend repeating this daily x3 more days.  Transfuse to keep hemoglobin above 7.5. -Management of chronic medical conditions per hospitalist.  Thank you for this referral.   Mikey Bussing, DNP, AGPCNP-BC, AOCNP   Addendum I have seen the patient, examined him. I agree with the assessment and and plan and have edited the notes.   75 yo male with PMH of atrial fibrillation on anticoagulation, chronic diastolic CHF, morbid obesity, presented with worsening anemia.  Further work-up showed left-sided colon cancer, status post hemicolectomy.  His surgical path is still pending.  I reviewed his staging CT scan, which showed no evidence of distant metastasis, but it showed a 1.3 cm enlarged lymph nodes posterior to the chronic lesion, so he likely has nodal metastasis.  2 small liver lesions are likely benign, pending liver MRI for further evaluation.  I discussed the role of adjuvant chemotherapy for node positive colon  cancer.  Patient lives with his sister, who is also in hospital now, no children or other relatives. His PS  is limited, probably not a good candidate for FOLFOX or [place] ox.  I will likely offer Xeloda alone if he needs adjuvant chemo. I plan to f/u in 3 weeks in my office.  All questions were answered. Pt will received 3 more doses B12 this week,then weekly X4 more weeks follow by monthly injection. Please let him start oral B12 1070mg daily also. I will order ferritin and iron for tomorrow morning to see if he needs iv iron.   YTruitt Merle

## 2020-12-11 ENCOUNTER — Other Ambulatory Visit (HOSPITAL_COMMUNITY): Payer: Self-pay

## 2020-12-11 DIAGNOSIS — K922 Gastrointestinal hemorrhage, unspecified: Secondary | ICD-10-CM | POA: Diagnosis not present

## 2020-12-11 DIAGNOSIS — D630 Anemia in neoplastic disease: Secondary | ICD-10-CM | POA: Diagnosis not present

## 2020-12-11 DIAGNOSIS — C779 Secondary and unspecified malignant neoplasm of lymph node, unspecified: Secondary | ICD-10-CM | POA: Diagnosis not present

## 2020-12-11 DIAGNOSIS — C186 Malignant neoplasm of descending colon: Secondary | ICD-10-CM | POA: Diagnosis not present

## 2020-12-11 LAB — BASIC METABOLIC PANEL
Anion gap: 7 (ref 5–15)
BUN: 20 mg/dL (ref 8–23)
CO2: 25 mmol/L (ref 22–32)
Calcium: 8.6 mg/dL — ABNORMAL LOW (ref 8.9–10.3)
Chloride: 108 mmol/L (ref 98–111)
Creatinine, Ser: 1.84 mg/dL — ABNORMAL HIGH (ref 0.61–1.24)
GFR, Estimated: 37 mL/min — ABNORMAL LOW (ref >60)
Glucose, Bld: 102 mg/dL — ABNORMAL HIGH (ref 70–99)
Potassium: 4.3 mmol/L (ref 3.5–5.1)
Sodium: 140 mmol/L (ref 135–145)

## 2020-12-11 LAB — TYPE AND SCREEN
ABO/RH(D): O NEG
Antibody Screen: NEGATIVE
Unit division: 0

## 2020-12-11 LAB — CBC
HCT: 31.1 % — ABNORMAL LOW (ref 39.0–52.0)
Hemoglobin: 9.7 g/dL — ABNORMAL LOW (ref 13.0–17.0)
MCH: 30.9 pg (ref 26.0–34.0)
MCHC: 31.2 g/dL (ref 30.0–36.0)
MCV: 99 fL (ref 80.0–100.0)
Platelets: 367 10*3/uL (ref 150–400)
RBC: 3.14 MIL/uL — ABNORMAL LOW (ref 4.22–5.81)
RDW: 15.7 % — ABNORMAL HIGH (ref 11.5–15.5)
WBC: 11.4 10*3/uL — ABNORMAL HIGH (ref 4.0–10.5)
nRBC: 0.2 % (ref 0.0–0.2)

## 2020-12-11 LAB — SURGICAL PATHOLOGY

## 2020-12-11 LAB — IRON AND TIBC
Iron: 112 ug/dL (ref 45–182)
Saturation Ratios: 45 % — ABNORMAL HIGH (ref 17.9–39.5)
TIBC: 251 ug/dL (ref 250–450)
UIBC: 139 ug/dL

## 2020-12-11 LAB — BPAM RBC
Blood Product Expiration Date: 202209142359
ISSUE DATE / TIME: 202209121113
Unit Type and Rh: 9500

## 2020-12-11 LAB — FERRITIN: Ferritin: 72 ng/mL (ref 24–336)

## 2020-12-11 MED ORDER — PROSOURCE PLUS PO LIQD
30.0000 mL | Freq: Two times a day (BID) | ORAL | Status: DC
  Administered 2020-12-11 – 2020-12-12 (×2): 30 mL via ORAL
  Filled 2020-12-11 (×3): qty 30

## 2020-12-11 MED ORDER — APIXABAN 5 MG PO TABS
5.0000 mg | ORAL_TABLET | Freq: Two times a day (BID) | ORAL | Status: DC
  Administered 2020-12-11 – 2020-12-13 (×5): 5 mg via ORAL
  Filled 2020-12-11 (×5): qty 1

## 2020-12-11 MED ORDER — ADULT MULTIVITAMIN W/MINERALS CH
1.0000 | ORAL_TABLET | Freq: Every day | ORAL | Status: DC
  Administered 2020-12-11 – 2020-12-13 (×3): 1 via ORAL
  Filled 2020-12-11 (×3): qty 1

## 2020-12-11 NOTE — Progress Notes (Signed)
ANTICOAGULATION CONSULT NOTE - Initial Consult  Pharmacy Consult for apixaban Indication: atrial fibrillation  Allergies  Allergen Reactions   Other     Tape,needs paper tape    Patient Measurements: Height: 5\' 9"  (175.3 cm) Weight: 134.5 kg (296 lb 8.3 oz) IBW/kg (Calculated) : 70.7  Vital Signs: Temp: 97.9 F (36.6 C) (09/13 1232) Temp Source: Oral (09/13 1232) BP: 159/64 (09/13 1232) Pulse Rate: 89 (09/13 1232)  Labs: Recent Labs    12/09/20 0555 12/10/20 0027 12/10/20 1616 12/11/20 0035  HGB 8.1* 7.7* 10.2* 9.7*  HCT 27.7* 25.3* 32.8* 31.1*  PLT 367 363 382 367  LABPROT 14.0 14.4  --   --   INR 1.1 1.1  --   --   CREATININE 1.90* 1.92*  --  1.84*    Estimated Creatinine Clearance: 45.7 mL/min (A) (by C-G formula based on SCr of 1.84 mg/dL (H)).   Medical History: Past Medical History:  Diagnosis Date   Arthritis    severe arthritis and deformity of his knee's   Chest pain    Chronic atrial fibrillation (HCC)    Chronic renal disease, stage III (HCC)    Chronic venous stasis dermatitis    Edema    Gout    History of cardiovascular stress test 01/17/2008   EF- 62%   Hypercholesterolemia    Hyperlipidemia    Hypertension    Kidney stones    Long term (current) use of anticoagulants    Long-term (current) use of anticoagulants    Obesity    Osteoarthritis    PAF (paroxysmal atrial fibrillation) (HCC)    Paroxysmal atrial fibrillation (HCC)    Secondary hypercoagulable state ([place])     Medications:  Medications Prior to Admission  Medication Sig Dispense Refill Last Dose   acetaminophen (TYLENOL) 325 MG tablet Take 325 mg by mouth every 6 (six) hours as needed (pain).    12/01/2020   allopurinol (ZYLOPRIM) 100 MG tablet Take 100 mg by mouth daily.   12/02/2020   amiodarone (PACERONE) 200 MG tablet 1/2 tablet daily (Patient taking differently: Take 100 mg by mouth daily. 1/2 tablet daily) 45 tablet 3 12/02/2020   ammonium lactate (LAC-HYDRIN) 12 %  lotion Apply 1 application topically 2 (two) times daily.   Past Week   atorvastatin (LIPITOR) 20 MG tablet Take 20 mg by mouth Daily.   12/02/2020   calcitRIOL (ROCALTROL) 0.5 MCG capsule Take 0.5 mcg daily by mouth.    12/01/2020   furosemide (LASIX) 20 MG tablet TAKE 2 TABLETS BY MOUTH TWICE DAILY (Patient taking differently: Take 40 mg by mouth 2 (two) times daily.) 120 tablet 0 12/02/2020   K Phos Mono-Sod Phos Di & Mono (VIRT-PHOS 250 NEUTRAL) [phone] MG TABS Take 1 tablet by mouth 2 (two) times a day.    12/01/2020   potassium chloride SA (KLOR-CON) 20 MEQ tablet TAKE 1 TABLET BY MOUTH DAILY (Patient taking differently: Take 20 mEq by mouth daily. TAKE 1 TABLET BY MOUTH DAILY) 90 tablet 3 12/01/2020   warfarin (COUMADIN) 2 MG tablet Take 4-5 mg by mouth one time only at 6 PM. 4 mg Monday, Wednesday & Friday. All other days- 2.5 tabs for 5 mg all other days  0 12/01/2020 at 8p    Assessment: 33 yom admitted with rectal bleeding and found to have a malignant colon tumor. He is s/p lap extended right colectomy with ileocolonic anastomosis creation. He was on warfarin PTA for Afib however may need to start Xeloda for  adjuvant chemo. Xeloda has a major DDI with warfarin. Pharmacy consulted to start apixaban for this reason.   No bleeding noted, Hgb low at 9.7, platelets are normal. CKD noted, SCr 1.84 (baseline ~1.9-2). Full dose is warranted for weight and age.  Discussed co-pay of $45 with patient - cost is ok for him.   Plan:  Apixaban 5 mg PO bid Education prior to d/c Pharmacy signing off but will continue to follow peripherally - please re-consult if needed  Thank you for involving pharmacy in this patient's care.  Renold Genta, PharmD, BCPS Clinical Pharmacist Clinical phone for 12/11/2020 until 3p is [phone] 12/11/2020 1:46 PM  **Pharmacist phone directory can be found on [email or web address] listed under [place]**

## 2020-12-11 NOTE — Progress Notes (Signed)
Nutrition Follow-up  DOCUMENTATION CODES:   Morbid obesity  INTERVENTION:   -D/c Ensure Surgery -Continue Boost Breeze po TID, each supplement provides 250 kcal and 9 grams of protein  -30 ml Prosource Plus BID, each supplement provides 100 kcals and 15 grams protein -Magic cup TID with meals, each supplement provides 290 kcal and 9 grams of protein  -MVI with minerals daily  NUTRITION DIAGNOSIS:   Inadequate oral intake related to nausea, poor appetite as evidenced by meal completion < 50%.  Ongoing  GOAL:   Patient will meet greater than or equal to 90% of their needs  Progressing   MONITOR:   Diet advancement, Supplement acceptance, PO intake, Labs, Weight trends, Skin, I & O's  REASON FOR ASSESSMENT:   NPO/Clear Liquid Diet    ASSESSMENT:   75 y.o. male with medical history significant of hypertension, hyperlipidemia, paroxysmal atrial fibrillation on Coumadin, osteoarthritis, and obesity who presents with complaints of bleeding rectally. Endoscopy/colonoscopy 9/7, which was significant for malignant 5 cm tumor in the proximal descending colon which was biopsied and was significant for malignancy. 9/10 lap extended right colectomy with ileocolonic anastomosis  9/11- advanced to clear liquid diet 9/13- advanced to full liquid diet  Reviewed I/O's: +307 ml x 24 hours and +4 L since admission  UOP: 250 ml x 24 hours   Pt unavailable at time of visit.   Per general surgery notes, surgical path still pending. Colonoscopy biopsy revealed adenocarcinoma. Pt's nausea has resolved.   No meal completion data available at this time. Pt is taking Boost Breeze supplements, however, refusing Ensure Surgery supplements.   Medications reviewed and include vitamin B-12.   Labs reviewed.  Diet Order:   Diet Order             Diet full liquid Room service appropriate? Yes; Fluid consistency: Thin  Diet effective now                   EDUCATION NEEDS:   Not  appropriate for education at this time  Skin:  Skin Assessment: Skin Integrity Issues: Skin Integrity Issues:: Incisions Incisions: abdomen  Last BM:  12/10/20  Height:   Ht Readings from Last 1 Encounters:  12/08/20 5\' 9"  (1.753 m)    Weight:   Wt Readings from Last 1 Encounters:  12/11/20 134.5 kg   BMI:  Body mass index is 43.79 kg/m.  Estimated Nutritional Needs:   Kcal:  2100-2300  Protein:  115-130 grams  Fluid:  >/= 2 L/day    Loistine Chance, RD, LDN, [place] Registered Dietitian II Certified Diabetes Care and Education Specialist Please refer to [facility] for RD and/or RD on-call/weekend/after hours pager

## 2020-12-11 NOTE — TOC Benefit Eligibility Note (Signed)
Patient Advocate Encounter  Insurance verification completed.    The patient is currently admitted and upon discharge could be taking Eliquis 5 mg.  The current 30 day co-pay is, $45.00.   The patient is insured through [employer] Medicare Part D   Dreyton Roessner, CPhT Pharmacy Patient Advocate Specialist [place] [facility]wardship Team Direct Number: [phone]  Fax: [phone]        

## 2020-12-11 NOTE — Discharge Instructions (Addendum)
Please remove all abdominal staples on 12/22/20. Call [facility] with any questions or concerns regarding this.   Information on my medicine - ELIQUIS (apixaban)  Why was Eliquis prescribed for you? Eliquis was prescribed for you to reduce the risk of a blood clot forming that can cause a stroke if you have a medical condition called atrial fibrillation (a type of irregular heartbeat).  What do You need to know about Eliquis ? Take your Eliquis TWICE DAILY - one tablet in the morning and one tablet in the evening with or without food. If you have difficulty swallowing the tablet whole please discuss with your pharmacist how to take the medication safely.  Take Eliquis exactly as prescribed by your doctor and DO NOT stop taking Eliquis without talking to the doctor who prescribed the medication.  Stopping may increase your risk of developing a stroke.  Refill your prescription before you run out.  After discharge, you should have regular check-up appointments with your healthcare provider that is prescribing your Eliquis.  In the future your dose may need to be changed if your kidney function or weight changes by a significant amount or as you get older.  What do you do if you miss a dose? If you miss a dose, take it as soon as you remember on the same day and resume taking twice daily.  Do not take more than one dose of ELIQUIS at the same time to make up a missed dose.  Important Safety Information A possible side effect of Eliquis is bleeding. You should call your healthcare provider right away if you experience any of the following: Bleeding from an injury or your nose that does not stop. Unusual colored urine (red or dark brown) or unusual colored stools (red or black). Unusual bruising for unknown reasons. A serious fall or if you hit your head (even if there is no bleeding).  Some medicines may interact with Eliquis and might increase your risk of bleeding or  clotting while on Eliquis. To help avoid this, consult your healthcare provider or pharmacist prior to using any new prescription or non-prescription medications, including herbals, vitamins, non-steroidal anti-inflammatory drugs (NSAIDs) and supplements.  This website has more information on Eliquis (apixaban): http://www.eliquis.com/eliquis/home    [facility], [place] [phone]  OPEN ABDOMINAL SURGERY: POST OP INSTRUCTIONS  Always review your discharge instruction sheet given to you by the facility where your surgery was performed.  IF YOU HAVE DISABILITY OR FAMILY LEAVE FORMS, YOU MUST BRING THEM TO THE OFFICE FOR PROCESSING.  PLEASE DO NOT GIVE THEM TO YOUR DOCTOR.  A prescription for pain medication may be given to you upon discharge.  Take your pain medication as prescribed, if needed.  If narcotic pain medicine is not needed, then you may take acetaminophen (Tylenol) or ibuprofen (Advil) as needed. Take your usually prescribed medications unless otherwise directed. If you need a refill on your pain medication, please contact your pharmacy. They will contact our office to request authorization.  Prescriptions will not be filled after 5pm or on week-ends. You should follow a light diet the first few days after arrival home, such as soup and crackers, pudding, etc.unless your doctor has advised otherwise. A high-fiber, low fat diet can be resumed as tolerated.   Be sure to include lots of fluids daily. Most patients will experience some swelling and bruising on the chest and neck area.  Ice packs will help.  Swelling and bruising  can take several days to resolve Most patients will experience some swelling and bruising in the area of the incision. Ice pack will help. Swelling and bruising can take several days to resolve..  It is common to experience some constipation if taking pain medication after surgery.  Increasing fluid intake and taking a stool softener will  usually help or prevent this problem from occurring.  A mild laxative (Milk of Magnesia or Miralax) should be taken according to package directions if there are no bowel movements after 48 hours.  You may find that a light gauze bandage over your incision may keep your staples from being rubbed or pulled. You may shower and replace the bandage daily. ACTIVITIES:  You may resume regular (light) daily activities beginning the next day--such as daily self-care, walking, climbing stairs--gradually increasing activities as tolerated.  You may have sexual intercourse when it is comfortable.  Refrain from any heavy lifting or straining until approved by your doctor. You may drive when you no longer are taking prescription pain medication, you can comfortably wear a seatbelt, and you can safely maneuver your car and apply brakes You should see your doctor in the office for a follow-up appointment approximately two weeks after your surgery.  Make sure that you call for this appointment within a day or two after you arrive home to insure a convenient appointment time.   WHEN TO CALL YOUR DOCTOR: Fever over 101.0 Inability to urinate Nausea and/or vomiting Extreme swelling or bruising Continued bleeding from incision. Increased pain, redness, or drainage from the incision. Difficulty swallowing or breathing Muscle cramping or spasms. Numbness or tingling in hands or feet or around lips.  The clinic staff is available to answer your questions during regular business hours.  Please don't hesitate to call and ask to speak to one of the nurses if you have concerns.  For further questions, please visit [email or web address]

## 2020-12-11 NOTE — Progress Notes (Signed)
GWYNN CROSSLEY   DOB:May 14, 1945   YB#:[id number]   NID#:[id number]  Oncology follow up   Subjective: Patient is clinically stable, incision pain is controlled, he is tolerating clear liquid.  No other new complaints. Remains to be in bed.    Objective:  Vitals:   12/11/20 1232 12/11/20 1558  BP: (!) 159/64 (!) 154/64  Pulse: 89 88  Resp: 15 15  Temp: 97.9 F (36.6 C) 97.9 F (36.6 C)  SpO2: 100% 100%    Body mass index is 43.79 kg/m.  Intake/Output Summary (Last 24 hours) at 12/11/2020 1744 Last data filed at 12/11/2020 0600 Gross per 24 hour  Intake 50 ml  Output --  Net 50 ml     Sclerae unicteric  Oropharynx clear  No peripheral adenopathy  Lungs clear -- no rales or rhonchi  Heart regular rate and rhythm  Abdomen soft   MSK no focal spinal tenderness, no peripheral edema  Neuro nonfocal    CBG (last 3)  No results for input(s): GLUCAP in the last 72 hours.   Labs:  Urine Studies No results for input(s): UHGB, CRYS in the last 72 hours.  Invalid input(s): UACOL, UAPR, USPG, UPH, UTP, UGL, UKET, UBIL, UNIT, UROB, ULEU, UEPI, UWBC, URBC, [place], [place], [place], [place]  Basic Metabolic Panel: Recent Labs  Lab 12/05/20 0033 12/06/20 0144 12/07/20 0255 12/08/20 0026 12/09/20 0555 12/10/20 0027 12/11/20 0035  NA 138 140 137 139 140 139 140  K 3.8 3.7 3.8 3.7 4.9 4.2 4.3  CL 103 106 105 108 111 109 108  CO2 26 25 26 23  18* 23 25  GLUCOSE 95 82 113* 90 105* 122* 102*  BUN 20 19 14 11 14 17 20   CREATININE 2.26* 2.10* 1.95* 1.79* 1.90* 1.92* 1.84*  CALCIUM 9.1 8.6* 8.5* 8.3* 8.5* 8.6* 8.6*  MG 2.2 2.1 2.0  --   --   --   --   PHOS  --   --   --   --   --  2.3*  --    GFR Estimated Creatinine Clearance: 45.7 mL/min (A) (by C-G formula based on SCr of 1.84 mg/dL (H)). Liver Function Tests: Recent Labs  Lab 12/05/20 0033 12/06/20 0144 12/07/20 0255  AST 21 21 17   ALT 8 8 9   ALKPHOS 104 93 101  BILITOT 1.7* 1.3* 1.2  PROT 6.6 5.7* 5.7*  ALBUMIN 3.3* 2.8*  2.7*   No results for input(s): LIPASE, AMYLASE in the last 168 hours. No results for input(s): AMMONIA in the last 168 hours. Coagulation profile Recent Labs  Lab 12/06/20 0144 12/07/20 0255 12/08/20 0026 12/09/20 0555 12/10/20 0027  INR 1.2 1.2 1.2 1.1 1.1    CBC: Recent Labs  Lab 12/05/20 0033 12/06/20 0144 12/07/20 0255 12/08/20 0026 12/09/20 0555 12/10/20 0027 12/10/20 1616 12/11/20 0035  WBC 12.4* 9.4 9.9 7.6 10.8* 10.7* 12.0* 11.4*  NEUTROABS 9.4* 6.8 7.3  --   --   --  9.8*  --   HGB 9.5* 8.2* 8.4* 8.1* 8.1* 7.7* 10.2* 9.7*  HCT 30.2* 26.0* 27.1* 26.0* 27.7* 25.3* 32.8* 31.1*  MCV 100.3* 100.8* 101.5* 102.0* 105.3* 102.8* 100.9* 99.0  PLT 343 297 302 307 367 363 382 367   Cardiac Enzymes: No results for input(s): CKTOTAL, CKMB, CKMBINDEX, TROPONINI in the last 168 hours. BNP: Invalid input(s): POCBNP CBG: No results for input(s): GLUCAP in the last 168 hours. D-Dimer No results for input(s): DDIMER in the last 72 hours. Hgb A1c No results for  input(s): HGBA1C in the last 72 hours. Lipid Profile No results for input(s): CHOL, HDL, LDLCALC, TRIG, CHOLHDL, LDLDIRECT in the last 72 hours. Thyroid function studies No results for input(s): TSH, T4TOTAL, T3FREE, THYROIDAB in the last 72 hours.  Invalid input(s): FREET3 Anemia work up Recent Labs    12/11/20 0035  FERRITIN 72  TIBC 251  IRON 112   Microbiology Recent Results (from the past 240 hour(s))  SARS CORONAVIRUS 2 (TAT 6-24 HRS) Nasopharyngeal Nasopharyngeal Swab     Status: None   Collection Time: 12/02/20 11:20 AM   Specimen: Nasopharyngeal Swab  Result Value Ref Range Status   SARS Coronavirus 2 NEGATIVE NEGATIVE Final    Comment: (NOTE) SARS-CoV-2 target nucleic acids are NOT DETECTED.  The SARS-CoV-2 RNA is generally detectable in upper and lower respiratory specimens during the acute phase of infection. Negative results do not preclude SARS-CoV-2 infection, do not rule  out co-infections with other pathogens, and should not be used as the sole basis for treatment or other patient management decisions. Negative results must be combined with clinical observations, patient history, and epidemiological information. The expected result is Negative.  Fact Sheet for Patients: SugarRoll.be  Fact Sheet for Healthcare Providers: https://www.woods-mathews.com/  This test is not yet approved or cleared by the [place] FDA and  has been authorized for detection and/or diagnosis of SARS-CoV-2 by FDA under an Emergency Use Authorization (EUA). This EUA will remain  in effect (meaning this test can be used) for the duration of the COVID-19 declaration under Se ction 564(b)(1) of the Act, 21 U.S.C. section 360bbb-3(b)(1), unless the authorization is terminated or revoked sooner.  Performed at [facility], [place] [address] [place], [place] [address]       Studies:  MR LIVER W WO CONTRAST  Result Date: 12/10/2020 CLINICAL DATA:  Colon cancer, status post colectomy, evaluate for hepatic metastases EXAM: MRI ABDOMEN WITHOUT AND WITH CONTRAST TECHNIQUE: Multiplanar multisequence MR imaging of the abdomen was performed both before and after the administration of intravenous contrast. CONTRAST:  79mL GADAVIST GADOBUTROL 1 MMOL/ML IV SOLN COMPARISON:  CT abdomen/pelvis dated 12/06/2020 FINDINGS: Motion degraded images. Specifically, the dynamic postcontrast imaging is markedly limited. Lower chest: Lung bases are clear. Hepatobiliary: 10 mm cyst in the posterior aspect of segment 2 (series 4/image 18), without enhancement following contrast administration, although severely motion degraded. Additional 3 mm cyst in segment 8 (series 4/image 14), not visualized following contrast administration. No suspicious/enhancing hepatic lesions. Gallbladder is unremarkable. No intrahepatic or extrahepatic ductal dilatation. Pancreas:   Within normal limits. Spleen:  Within normal limits. Adrenals/Urinary Tract:  Adrenal glands are within normal limits. Left renal sinus cysts. Additional tiny bilateral renal cysts. No hydronephrosis. Stomach/Bowel: Stomach is within normal limits. Dilated loops of small bowel in the central abdomen, likely reflecting adynamic ileus in this patient status post left colon resection (incompletely visualized). Vascular/Lymphatic:  No evidence of abdominal aortic aneurysm. No suspicious abdominal lymphadenopathy. Other:  No abdominal ascites.  Body wall edema. Musculoskeletal: No focal osseous lesions. IMPRESSION: Motion degraded images. Two probable cysts in the liver, as above. These are not considered suspicious for metastatic disease. Regardless, given the limitations of the current study, attention on routine follow-up is suggested. Electronically Signed   By: Julian Hy M.D.   On: 12/10/2020 21:38    Assessment: 75 y.o. male   Left side colon cancer, pT3N1aMo, stage IIIB Stage III CKD  PAF Chronic diastolic CHF Morbid obesity Moderate anemia secondary to colon cancer,  B12 deficiency and surgery    Plan:  -I reviewed his surgical pathology findings with patient in detail today, he had a moderately differentiated adenocarcinoma, T3, 1 positive lymph nodes, surgical margins 1 negative. -His risk of recurrence is moderate to high. -I discussed adjuvant chemotherapy FOLFOX, [place] ox, versus single agent Xeloda.  Due to his significant comorbidities, and limited social support, he may tolerate intensive chemo.  If his GFR remains to be above 30, I would recommend single agent Xeloda with dose adjustment.  Plan to see him back in 3 weeks at the office with repeated lab. -No port placement at this point.  We will see how he recovers from surgery. -Due to drug drug interaction of Coumadin and Xeloda, I recommend switching Coumadin to Eliquis for his AF.  -Patient will likely go to rehab, I will set  up his follow-up    Truitt Merle, MD 12/11/2020  5:44 PM

## 2020-12-11 NOTE — Progress Notes (Signed)
PROGRESS NOTE                                                                                                                                                                                                             Patient Demographics:    Bradley Stokes, is a 75 y.o. male, DOB - 1945/10/14, WNU:[id number]  Outpatient Primary MD for the patient is [name], [name], MD    LOS - 8  Admit date - 12/02/2020    Chief Complaint  Patient presents with   Rectal Bleeding       Brief Narrative    Bradley Stokes is a 75 y.o. male with medical history significant of hypertension, hyperlipidemia, paroxysmal atrial fibrillation on Coumadin, osteoarthritis, and obesity who presents with complaints of bleeding rectally, he was noted to have INR of 2.9, H&H was stable he was admitted to the hospital for further blood per rectum work-up.  Patient went for endoscopy/colonoscopy 9/7, which was significant for malignant 5 cm tumor in the proximal descending colon which was biopsied and was significant for malignancy.  Status post right colectomy, with operative biopsy significant for metastatic carcinoma on 1 of 23 lymph node (1/23).    Significant events -9/7 EGD/colonoscopy -9/10 lap extended right colectomy with ileocolonic anastomosis creation by Dr. Kieth Brightly and Dr. Radene Knee   Subjective:   He is doing well, he does report some soreness around the incisions, tolerating clear liquid diet, had some nausea yesterday currently resolved, no flatus or bowel movements yet.     Assessment  & Plan :       Colonic mass/adenocarcinoma -Status post EGD/colonoscopy 9/7 significant for 5 cm colon mass in proximal descending colon. -Biopsy significant for invasive moderately differentiated adenocarcinoma. -CEA is normal 3.4 -9/10 lap extended right colectomy with ileocolonic anastomosis creation by Dr. Kieth Brightly and Dr.  Radene Knee -Chest/abdomen/pelvis with IV contrast 9/8 significant for single lymph node metastasis, and to liver cysts, MRI liver appears liver lesions are benign cysts, operative pathology showing 1 in 23 lymph node with metastatic carcinoma . -GI input greatly appreciated, very likely patient will receive p.o. chemo Xeloda, unlikely IV chemotherapy, no plan for port currently.   -Likely mild postoperative ileus, tolerating clear liquid, will be advanced to full liquid diet, management per general surgery. -Resume anticoagulation from general surgery standpoint, patient will be switched  from warfarin to Eliquis given interactions between warfarin and Xeloda, to receive first dose this evening. -Oncology consult input greatly appreciated.  AKI on CKD 3B  - Baseline creatinine close to 2, peaked at 2.4, improving with holding Lasix, hold IV fluids.  Acute lower GI bleed/acute blood loss anemia due to colonic mass.  . -Patient on warfarin, which has been held, he received vitamin K as well,  INR has normalized now.   -On Protonix. -Monitor H&H closely  Paroxysmal A. fib with CHA2DS2-VASc 2 score of greater than 3.  Continue amiodarone for rate control. -Warfarin remains on hold, held initially due to bleeding from  Mass, resected, held anticoagulation postoperatively, okay to resume anticoagulation per general surgery, so he will be resumed on Eliquis.   Morbid obesity.  BMI of 41.  Follow with PCP for weight loss.  Chronic diastolic CHF last known EF is 55% in 2016.  Has a chronic leg edema continue Lasix held for bowel prep and mild AKI. Added TED stockings.  Dyslipidemia - statin.         Condition -  Guarded  Family Communication  :  None present at bedside , patient NOK , his sister is hospitalized as well.  Code Status :  Full  Consults  :  GI, General surgery  PUD Prophylaxis : PPI  DVT Prophylaxis  : lovenox, started Eliquis today.   Procedures  :      -9/7  EGD/colonoscopy -9/10 lap extended right colectomy with ileocolonic anastomosis creation by Dr. Kieth Brightly and Dr. Radene Knee      Disposition Plan  :    Status is: Inpatient  Remains inpatient appropriate because:IV treatments appropriate due to intensity of illness or inability to take PO  Dispo: The patient is from: Home              Anticipated d/c is to: SNF               Patient currently is not medically stable to d/c.   Difficult to place patient No    Lab Results  Component Value Date   PLT 367 12/11/2020    Diet :  Diet Order             Diet full liquid Room service appropriate? Yes; Fluid consistency: Thin  Diet effective now                    Inpatient Medications  Scheduled Meds:  (feeding supplement) PROSource Plus  30 mL Oral BID BM   allopurinol  100 mg Oral Daily   amiodarone  100 mg Oral Daily   ammonium lactate  1 application Topical BID   atorvastatin  20 mg Oral Daily   calcitRIOL  0.5 mcg Oral Daily   cyanocobalamin  1,000 mcg Subcutaneous Daily   enoxaparin (LOVENOX) injection  70 mg Subcutaneous Q24H   feeding supplement  1 Container Oral TID BM   multivitamin with minerals  1 tablet Oral Daily   pantoprazole  40 mg Oral Daily   sodium chloride flush  3 mL Intravenous Q12H   Continuous Infusions:  promethazine (PHENERGAN) injection (IM or IVPB) 12.5 mg (12/10/20 1440)     PRN Meds:.acetaminophen **OR** acetaminophen, albuterol, metoprolol tartrate, morphine injection, ondansetron **OR** ondansetron (ZOFRAN) IV, oxyCODONE, promethazine (PHENERGAN) injection (IM or IVPB)  Antibiotics  :    Anti-infectives (From admission, onward)    Start     Dose/Rate Route Frequency Ordered Stop   12/07/20 1445  cefoTEtan (CEFOTAN) 2 g in sodium chloride 0.9 % 100 mL IVPB        2 g 200 mL/hr over 30 Minutes Intravenous On call to O.R. 12/07/20 1355 12/08/20 0559   12/07/20 0600  cefoTEtan (CEFOTAN) 2 g in sodium chloride 0.9 % 100 mL IVPB   Status:  Discontinued        2 g 200 mL/hr over 30 Minutes Intravenous On call to O.R. 12/06/20 1436 12/07/20 2042   12/06/20 2200  neomycin (MYCIFRADIN) tablet 1,000 mg       See Hyperspace for full Linked Orders Report.   1,000 mg Oral 3 times per day 12/06/20 1436 12/07/20 1614   12/06/20 2200  metroNIDAZOLE (FLAGYL) tablet 1,000 mg       See Hyperspace for full Linked Orders Report.   1,000 mg Oral 3 times per day 12/06/20 1436 12/07/20 1444          Zade Falkner M.D on 12/11/2020 at 1:33 PM  To page go to [email or web address]   [facility] -  Office  [phone]    See all Orders from today for further details    Objective:   Vitals:   12/11/20 0311 12/11/20 0635 12/11/20 0818 12/11/20 1232  BP: (!) 155/69  (!) 156/69 (!) 159/64  Pulse: 92 88 91 89  Resp: (!) 22 13 16 15   Temp: 98.4 F (36.9 C)  98.3 F (36.8 C) 97.9 F (36.6 C)  TempSrc: Oral  Oral Oral  SpO2: 99% 100% 97% 100%  Weight:  134.5 kg    Height:        Wt Readings from Last 3 Encounters:  12/11/20 134.5 kg  06/12/20 130.2 kg  12/21/19 135.5 kg     Intake/Output Summary (Last 24 hours) at 12/11/2020 1333 Last data filed at 12/11/2020 0600 Gross per 24 hour  Intake 50 ml  Output --  Net 50 ml     Physical Exam  Awake Alert, Oriented X 3, No new F.N deficits, Normal affect Symmetrical Chest wall movement, Good air movement bilaterally, CTAB RRR,No Gallops,Rubs or new Murmurs, No Parasternal Heave Abdomen soft, bowel sounds present, incisions with dressing C/D/I Lower extremity skin changes, some trace edema.           Data Review:    CBC Recent Labs  Lab 12/05/20 0033 12/06/20 0144 12/07/20 0255 12/08/20 0026 12/09/20 0555 12/10/20 0027 12/10/20 1616 12/11/20 0035  WBC 12.4* 9.4 9.9 7.6 10.8* 10.7* 12.0* 11.4*  HGB 9.5* 8.2* 8.4* 8.1* 8.1* 7.7* 10.2* 9.7*  HCT 30.2* 26.0* 27.1* 26.0* 27.7* 25.3* 32.8* 31.1*  PLT 343 297 302 307 367 363 382 367  MCV 100.3*  100.8* 101.5* 102.0* 105.3* 102.8* 100.9* 99.0  MCH 31.6 31.8 31.5 31.8 30.8 31.3 31.4 30.9  MCHC 31.5 31.5 31.0 31.2 29.2* 30.4 31.1 31.2  RDW 13.6 13.7 14.0 14.3 14.3 14.2 15.7* 15.7*  LYMPHSABS 1.5 1.3 1.1  --   --   --  0.8  --   MONOABS 1.2* 0.8 1.1*  --   --   --  1.1*  --   EOSABS 0.2 0.5 0.3  --   --   --  0.0  --   BASOSABS 0.1 0.0 0.0  --   --   --  0.0  --     Recent Labs  Lab 12/05/20 0033 12/06/20 0144 12/07/20 0255 12/08/20 0026 12/09/20 0555 12/10/20 0027 12/11/20 0035  NA 138 140 137 139 140 139 140  K  3.8 3.7 3.8 3.7 4.9 4.2 4.3  CL 103 106 105 108 111 109 108  CO2 26 25 26 23  18* 23 25  GLUCOSE 95 82 113* 90 105* 122* 102*  BUN 20 19 14 11 14 17 20   CREATININE 2.26* 2.10* 1.95* 1.79* 1.90* 1.92* 1.84*  CALCIUM 9.1 8.6* 8.5* 8.3* 8.5* 8.6* 8.6*  AST 21 21 17   --   --   --   --   ALT 8 8 9   --   --   --   --   ALKPHOS 104 93 101  --   --   --   --   BILITOT 1.7* 1.3* 1.2  --   --   --   --   ALBUMIN 3.3* 2.8* 2.7*  --   --   --   --   MG 2.2 2.1 2.0  --   --   --   --   INR 1.4* 1.2 1.2 1.2 1.1 1.1  --   BNP 23.7 79.8 80.7  --   --   --   --     ------------------------------------------------------------------------------------------------------------------ No results for input(s): CHOL, HDL, LDLCALC, TRIG, CHOLHDL, LDLDIRECT in the last 72 hours.  No results found for: HGBA1C ------------------------------------------------------------------------------------------------------------------ No results for input(s): TSH, T4TOTAL, T3FREE, THYROIDAB in the last 72 hours.  Invalid input(s): FREET3  Cardiac Enzymes No results for input(s): CKMB, TROPONINI, MYOGLOBIN in the last 168 hours.  Invalid input(s): CK ------------------------------------------------------------------------------------------------------------------    Component Value Date/Time   BNP 80.7 12/07/2020 0255     Radiology Reports MR LIVER W WO CONTRAST  Result Date:  12/10/2020 CLINICAL DATA:  Colon cancer, status post colectomy, evaluate for hepatic metastases EXAM: MRI ABDOMEN WITHOUT AND WITH CONTRAST TECHNIQUE: Multiplanar multisequence MR imaging of the abdomen was performed both before and after the administration of intravenous contrast. CONTRAST:  58mL GADAVIST GADOBUTROL 1 MMOL/ML IV SOLN COMPARISON:  CT abdomen/pelvis dated 12/06/2020 FINDINGS: Motion degraded images. Specifically, the dynamic postcontrast imaging is markedly limited. Lower chest: Lung bases are clear. Hepatobiliary: 10 mm cyst in the posterior aspect of segment 2 (series 4/image 18), without enhancement following contrast administration, although severely motion degraded. Additional 3 mm cyst in segment 8 (series 4/image 14), not visualized following contrast administration. No suspicious/enhancing hepatic lesions. Gallbladder is unremarkable. No intrahepatic or extrahepatic ductal dilatation. Pancreas:  Within normal limits. Spleen:  Within normal limits. Adrenals/Urinary Tract:  Adrenal glands are within normal limits. Left renal sinus cysts. Additional tiny bilateral renal cysts. No hydronephrosis. Stomach/Bowel: Stomach is within normal limits. Dilated loops of small bowel in the central abdomen, likely reflecting adynamic ileus in this patient status post left colon resection (incompletely visualized). Vascular/Lymphatic:  No evidence of abdominal aortic aneurysm. No suspicious abdominal lymphadenopathy. Other:  No abdominal ascites.  Body wall edema. Musculoskeletal: No focal osseous lesions. IMPRESSION: Motion degraded images. Two probable cysts in the liver, as above. These are not considered suspicious for metastatic disease. Regardless, given the limitations of the current study, attention on routine follow-up is suggested. Electronically Signed   By: Julian Hy M.D.   On: 12/10/2020 21:38   CT CHEST ABDOMEN PELVIS W CONTRAST  Result Date: 12/06/2020 CLINICAL DATA:  Colon cancer,  initial staging. EXAM: CT CHEST, ABDOMEN, AND PELVIS WITH CONTRAST TECHNIQUE: Multidetector CT imaging of the chest, abdomen and pelvis was performed following the standard protocol during bolus administration of intravenous contrast. CONTRAST:  56mL OMNIPAQUE IOHEXOL 350 MG/ML SOLN COMPARISON:  No imaging available  for comparison at time of dictation. FINDINGS: CT CHEST FINDINGS Cardiovascular: Aortic and branch vessel atherosclerosis without aneurysmal dilation. Three-vessel coronary artery calcifications. Normal size heart. No significant pericardial effusion/thickening. No central pulmonary embolus. Mediastinum/Nodes: No supraclavicular adenopathy. No discrete thyroid nodule. No pathologically enlarged mediastinal, hilar or axillary lymph nodes. The trachea and esophagus are unremarkable. Lungs/Pleura: No suspicious pulmonary nodules or masses. Bibasilar atelectasis. No pleural effusion. No pneumothorax. Musculoskeletal: Healing left lateral eighth and ninth rib fractures, without associated lesion to suggest pathologic fracture. No aggressive lytic or blastic lesion of bone. CT ABDOMEN PELVIS FINDINGS Hepatobiliary: Hypodense 7 mm lesion in the left lobe of the liver on image 48/3 and a hypodense 4 mm lesion in the right lobe of the liver on image 49/3, incompletely characterized on this examination. Gallbladder is unremarkable. No biliary ductal dilation. Pancreas: No pancreatic ductal dilatation or surrounding inflammatory changes. Spleen: Within normal limits. Adrenals/Urinary Tract: Bilateral adrenal glands are unremarkable. No hydronephrosis. Bilateral renal cortical thinning. Urinary bladder is unremarkable for degree of distension. Stomach/Bowel: Stomach is decompressed. Periampullary duodenal diverticulum. No pathologic dilation of small or large bowel. Radiopaque enteric contrast traverses the sigmoid colon. Short segment of colonic wall thickening involving the splenic flexure for instance on image  64/3 and 28/4 likely reflects patient's presumed colonic malignancy. Left-sided colonic diverticulosis without findings of acute diverticulitis. Vascular/Lymphatic: Aorta bi-iliac atherosclerotic calcifications without abdominal aortic aneurysm. 1.3 cm lymph node posterior to the colonic lesion on image 61/3 likely reflecting local nodal involvement. No additional pathologically enlarged abdominal or pelvic lymph nodes. Reproductive: Prostate is unremarkable. Other: No significant abdominopelvic ascites. Nonspecific body wall edema. Musculoskeletal: Multilevel degenerative changes spine. No aggressive lytic or blastic lesion of bone. IMPRESSION: 1. Short segment of colonic wall thickening involving the splenic flexure likely reflects patient's presumed colonic malignancy. 2. Single 1.3 cm lymph node posterior to the colonic lesion likely reflecting local nodal involvement. No additional pathologically enlarged abdominal or pelvic lymph nodes to suggest distant nodal metastases. 3. Two subcentimeter hypodense hepatic lesions, incompletely characterized on this examination. Possibly reflecting benign hepatic cysts but cystic hepatic metastases not excluded on this study. Consider attention on close interval follow-up imaging versus further evaluation with hepatic protocol MRI with and without contrast, preferably as an outpatient when patient is stable and able to follow commands. 4. Healing nonpathologic left lateral eighth and ninth rib fractures. 5. Aortic Atherosclerosis (ICD10-I70.0). Electronically Signed   By: Dahlia Bailiff M.D.   On: 12/06/2020 20:56   DG CHEST PORT 1 VIEW  Result Date: 12/02/2020 CLINICAL DATA:  Chronic atrial fibrillation. Reported history of rectal bleeding. EXAM: PORTABLE CHEST 1 VIEW COMPARISON:  Chest radiograph dated 07/10/2016. FINDINGS: The heart size is enlarged. Vascular calcifications are seen in the aortic arch. The left costophrenic angle is obscured which may reflect  pericardial fat versus a pleural effusion. The right lung is clear and there is no right pleural effusion. There is no pneumothorax. The visualized skeletal structures are unremarkable. IMPRESSION: Cardiomegaly. Obscured left costophrenic angle may represent pericardial fat versus a pleural effusion. Aortic Atherosclerosis (ICD10-I70.0). Electronically Signed   By: Zerita Boers M.D.   On: 12/02/2020 15:11

## 2020-12-11 NOTE — Progress Notes (Signed)
3 Days Post-Op  Subjective: CC: Patient is doing well. Some soreness around his incisions and on the right side of his abdomen. Tolerating cld. Nausea resolved. No emesis. No flatus or bm yet. Reports foley out yesterday and has voided since then. Working with therapies.   Objective: Vital signs in last 24 hours: Temp:  [97.9 F (36.6 C)-98.8 F (37.1 C)] 98.3 F (36.8 C) (09/13 0818) Pulse Rate:  [74-96] 91 (09/13 0818) Resp:  [13-22] 16 (09/13 0818) BP: (135-167)/(65-72) 156/69 (09/13 0818) SpO2:  [92 %-100 %] 97 % (09/13 0818) Weight:  [134.5 kg] 134.5 kg (09/13 0635) Last BM Date: 12/07/20  Intake/Output from previous day: 09/12 0701 - 09/13 0700 In: 557 [Blood:507; IV Piggyback:50] Out: 250 [Urine:250] Intake/Output this shift: No intake/output data recorded.  PE: Gen:  Alert, NAD, pleasant Pulm: Normal rate and effort  Abd: Soft, mild distension, appropriately tender, +BS, incision w/ dressings in place - c/d/I.  Psych: A&Ox3  Skin: no rashes noted, warm and dry  Lab Results:  Recent Labs    12/10/20 1616 12/11/20 0035  WBC 12.0* 11.4*  HGB 10.2* 9.7*  HCT 32.8* 31.1*  PLT 382 367   BMET Recent Labs    12/10/20 0027 12/11/20 0035  NA 139 140  K 4.2 4.3  CL 109 108  CO2 23 25  GLUCOSE 122* 102*  BUN 17 20  CREATININE 1.92* 1.84*  CALCIUM 8.6* 8.6*   PT/INR Recent Labs    12/09/20 0555 12/10/20 0027  LABPROT 14.0 14.4  INR 1.1 1.1   CMP     Component Value Date/Time   NA 140 12/11/2020 0035   NA 142 06/12/2020 1126   K 4.3 12/11/2020 0035   CL 108 12/11/2020 0035   CO2 25 12/11/2020 0035   GLUCOSE 102 (H) 12/11/2020 0035   BUN 20 12/11/2020 0035   BUN 18 06/12/2020 1126   CREATININE 1.84 (H) 12/11/2020 0035   CREATININE 1.79 (H) 01/01/2015 1234   CALCIUM 8.6 (L) 12/11/2020 0035   PROT 5.7 (L) 12/07/2020 0255   PROT 6.5 06/12/2020 1126   ALBUMIN 2.7 (L) 12/07/2020 0255   ALBUMIN 3.9 06/12/2020 1126   AST 17 12/07/2020 0255    ALT 9 12/07/2020 0255   ALKPHOS 101 12/07/2020 0255   BILITOT 1.2 12/07/2020 0255   BILITOT 0.6 06/12/2020 1126   GFRNONAA 37 (L) 12/11/2020 0035   GFRAA 38 (L) 12/21/2019 1155   Lipase  No results found for: LIPASE  Studies/Results: MR LIVER W WO CONTRAST  Result Date: 12/10/2020 CLINICAL DATA:  Colon cancer, status post colectomy, evaluate for hepatic metastases EXAM: MRI ABDOMEN WITHOUT AND WITH CONTRAST TECHNIQUE: Multiplanar multisequence MR imaging of the abdomen was performed both before and after the administration of intravenous contrast. CONTRAST:  84mL GADAVIST GADOBUTROL 1 MMOL/ML IV SOLN COMPARISON:  CT abdomen/pelvis dated 12/06/2020 FINDINGS: Motion degraded images. Specifically, the dynamic postcontrast imaging is markedly limited. Lower chest: Lung bases are clear. Hepatobiliary: 10 mm cyst in the posterior aspect of segment 2 (series 4/image 18), without enhancement following contrast administration, although severely motion degraded. Additional 3 mm cyst in segment 8 (series 4/image 14), not visualized following contrast administration. No suspicious/enhancing hepatic lesions. Gallbladder is unremarkable. No intrahepatic or extrahepatic ductal dilatation. Pancreas:  Within normal limits. Spleen:  Within normal limits. Adrenals/Urinary Tract:  Adrenal glands are within normal limits. Left renal sinus cysts. Additional tiny bilateral renal cysts. No hydronephrosis. Stomach/Bowel: Stomach is within normal limits. Dilated loops of small  bowel in the central abdomen, likely reflecting adynamic ileus in this patient status post left colon resection (incompletely visualized). Vascular/Lymphatic:  No evidence of abdominal aortic aneurysm. No suspicious abdominal lymphadenopathy. Other:  No abdominal ascites.  Body wall edema. Musculoskeletal: No focal osseous lesions. IMPRESSION: Motion degraded images. Two probable cysts in the liver, as above. These are not considered suspicious for  metastatic disease. Regardless, given the limitations of the current study, attention on routine follow-up is suggested. Electronically Signed   By: Julian Hy M.D.   On: 12/10/2020 21:38    Anti-infectives: Anti-infectives (From admission, onward)    Start     Dose/Rate Route Frequency Ordered Stop   12/07/20 1445  cefoTEtan (CEFOTAN) 2 g in sodium chloride 0.9 % 100 mL IVPB        2 g 200 mL/hr over 30 Minutes Intravenous On call to O.R. 12/07/20 1355 12/08/20 0559   12/07/20 0600  cefoTEtan (CEFOTAN) 2 g in sodium chloride 0.9 % 100 mL IVPB  Status:  Discontinued        2 g 200 mL/hr over 30 Minutes Intravenous On call to O.R. 12/06/20 1436 12/07/20 2042   12/06/20 2200  neomycin (MYCIFRADIN) tablet 1,000 mg       See Hyperspace for full Linked Orders Report.   1,000 mg Oral 3 times per day 12/06/20 1436 12/07/20 1614   12/06/20 2200  metroNIDAZOLE (FLAGYL) tablet 1,000 mg       See Hyperspace for full Linked Orders Report.   1,000 mg Oral 3 times per day 12/06/20 1436 12/07/20 1444        Assessment/Plan POD 3 s/p lap extended right colectomy with ileocolonic anastomosis creation on Proximal descending colon mass by Dr. Kieth Brightly and Dr. Radene Knee 9/10 - Colonoscopy bx with adenocarcinoma. Surgical path pending.  - CEA wnl at 3.4 - CT chest/abd/pelvis with IV contrast 9/8 with single 1.3 cm lymph node posterior to the colonic lesion likely reflecting local nodal involvement. This also showed suspect benign hepatic cysts but cystic hepatic metastases could not be excluded - MRI liver appears that areas are liver cysts.  - Oncology following. Considering Xeloda if he needs adjuvant chemo - Adv to FLD - Mobilize - PT rec SNF - Pulm toilet - Will need to confirm he would not need a port before resuming anticoagulation. Otherwise can resume anticoagulation from our standpoint.    FEN: FLD ID: [email or web address] periop VTE: Lovenox  Foley - out, voiding    ABL anemia  Atrial  fibrillation HTN HLD   LOS: 8 days    Jillyn Ledger , [facility] 12/11/2020, 9:16 AM Please see Amion for pager number during day hours 7:00am-4:30pm

## 2020-12-12 LAB — BASIC METABOLIC PANEL
Anion gap: 4 — ABNORMAL LOW (ref 5–15)
BUN: 21 mg/dL (ref 8–23)
CO2: 27 mmol/L (ref 22–32)
Calcium: 8.8 mg/dL — ABNORMAL LOW (ref 8.9–10.3)
Chloride: 111 mmol/L (ref 98–111)
Creatinine, Ser: 1.76 mg/dL — ABNORMAL HIGH (ref 0.61–1.24)
GFR, Estimated: 39 mL/min — ABNORMAL LOW (ref >60)
Glucose, Bld: 111 mg/dL — ABNORMAL HIGH (ref 70–99)
Potassium: 4.2 mmol/L (ref 3.5–5.1)
Sodium: 142 mmol/L (ref 135–145)

## 2020-12-12 LAB — CBC
HCT: 31 % — ABNORMAL LOW (ref 39.0–52.0)
Hemoglobin: 9.6 g/dL — ABNORMAL LOW (ref 13.0–17.0)
MCH: 31.3 pg (ref 26.0–34.0)
MCHC: 31 g/dL (ref 30.0–36.0)
MCV: 101 fL — ABNORMAL HIGH (ref 80.0–100.0)
Platelets: 339 10*3/uL (ref 150–400)
RBC: 3.07 MIL/uL — ABNORMAL LOW (ref 4.22–5.81)
RDW: 15.2 % (ref 11.5–15.5)
WBC: 8.8 10*3/uL (ref 4.0–10.5)
nRBC: 0 % (ref 0.0–0.2)

## 2020-12-12 LAB — SARS CORONAVIRUS 2 (TAT 6-24 HRS): SARS Coronavirus 2: NEGATIVE

## 2020-12-12 NOTE — Progress Notes (Signed)
4 Days Post-Op  Subjective: CC: Tolerated full liquid diet.   Objective: Vital signs in last 24 hours: Temp:  [97.6 F (36.4 C)-98.8 F (37.1 C)] 97.7 F (36.5 C) (09/14 1614) Pulse Rate:  [73-86] 77 (09/14 1614) Resp:  [14-18] 18 (09/14 1614) BP: (129-146)/(53-68) 138/67 (09/14 1614) SpO2:  [97 %-100 %] 100 % (09/14 1614) Last BM Date: 12/12/20  Intake/Output from previous day: 09/13 0701 - 09/14 0700 In: 150 [P.O.:150] Out: 450 [Urine:450] Intake/Output this shift: Total I/O In: -  Out: 500 [Urine:500]  PE: Gen:  Alert, NAD, pleasant Pulm: Normal rate and effort  Abd: Soft, mild distension, appropriately tender, incision w/ dressings in place - c/d/I.  Psych: A&Ox3  Skin: no rashes noted, warm and dry  Lab Results:  Recent Labs    12/11/20 0035 12/12/20 0037  WBC 11.4* 8.8  HGB 9.7* 9.6*  HCT 31.1* 31.0*  PLT 367 339   BMET Recent Labs    12/11/20 0035 12/12/20 0037  NA 140 142  K 4.3 4.2  CL 108 111  CO2 25 27  GLUCOSE 102* 111*  BUN 20 21  CREATININE 1.84* 1.76*  CALCIUM 8.6* 8.8*   PT/INR Recent Labs    12/10/20 0027  LABPROT 14.4  INR 1.1   CMP     Component Value Date/Time   NA 142 12/12/2020 0037   NA 142 06/12/2020 1126   K 4.2 12/12/2020 0037   CL 111 12/12/2020 0037   CO2 27 12/12/2020 0037   GLUCOSE 111 (H) 12/12/2020 0037   BUN 21 12/12/2020 0037   BUN 18 06/12/2020 1126   CREATININE 1.76 (H) 12/12/2020 0037   CREATININE 1.79 (H) 01/01/2015 1234   CALCIUM 8.8 (L) 12/12/2020 0037   PROT 5.7 (L) 12/07/2020 0255   PROT 6.5 06/12/2020 1126   ALBUMIN 2.7 (L) 12/07/2020 0255   ALBUMIN 3.9 06/12/2020 1126   AST 17 12/07/2020 0255   ALT 9 12/07/2020 0255   ALKPHOS 101 12/07/2020 0255   BILITOT 1.2 12/07/2020 0255   BILITOT 0.6 06/12/2020 1126   GFRNONAA 39 (L) 12/12/2020 0037   GFRAA 38 (L) 12/21/2019 1155   Lipase  No results found for: LIPASE  Studies/Results: MR LIVER W WO CONTRAST  Result Date:  12/10/2020 CLINICAL DATA:  Colon cancer, status post colectomy, evaluate for hepatic metastases EXAM: MRI ABDOMEN WITHOUT AND WITH CONTRAST TECHNIQUE: Multiplanar multisequence MR imaging of the abdomen was performed both before and after the administration of intravenous contrast. CONTRAST:  62mL GADAVIST GADOBUTROL 1 MMOL/ML IV SOLN COMPARISON:  CT abdomen/pelvis dated 12/06/2020 FINDINGS: Motion degraded images. Specifically, the dynamic postcontrast imaging is markedly limited. Lower chest: Lung bases are clear. Hepatobiliary: 10 mm cyst in the posterior aspect of segment 2 (series 4/image 18), without enhancement following contrast administration, although severely motion degraded. Additional 3 mm cyst in segment 8 (series 4/image 14), not visualized following contrast administration. No suspicious/enhancing hepatic lesions. Gallbladder is unremarkable. No intrahepatic or extrahepatic ductal dilatation. Pancreas:  Within normal limits. Spleen:  Within normal limits. Adrenals/Urinary Tract:  Adrenal glands are within normal limits. Left renal sinus cysts. Additional tiny bilateral renal cysts. No hydronephrosis. Stomach/Bowel: Stomach is within normal limits. Dilated loops of small bowel in the central abdomen, likely reflecting adynamic ileus in this patient status post left colon resection (incompletely visualized). Vascular/Lymphatic:  No evidence of abdominal aortic aneurysm. No suspicious abdominal lymphadenopathy. Other:  No abdominal ascites.  Body wall edema. Musculoskeletal: No focal osseous lesions. IMPRESSION: Motion  degraded images. Two probable cysts in the liver, as above. These are not considered suspicious for metastatic disease. Regardless, given the limitations of the current study, attention on routine follow-up is suggested. Electronically Signed   By: Julian Hy M.D.   On: 12/10/2020 21:38    Anti-infectives: Anti-infectives (From admission, onward)    Start     Dose/Rate Route  Frequency Ordered Stop   12/07/20 1445  cefoTEtan (CEFOTAN) 2 g in sodium chloride 0.9 % 100 mL IVPB        2 g 200 mL/hr over 30 Minutes Intravenous On call to O.R. 12/07/20 1355 12/08/20 0559   12/07/20 0600  cefoTEtan (CEFOTAN) 2 g in sodium chloride 0.9 % 100 mL IVPB  Status:  Discontinued        2 g 200 mL/hr over 30 Minutes Intravenous On call to O.R. 12/06/20 1436 12/07/20 2042   12/06/20 2200  neomycin (MYCIFRADIN) tablet 1,000 mg       See Hyperspace for full Linked Orders Report.   1,000 mg Oral 3 times per day 12/06/20 1436 12/07/20 1614   12/06/20 2200  metroNIDAZOLE (FLAGYL) tablet 1,000 mg       See Hyperspace for full Linked Orders Report.   1,000 mg Oral 3 times per day 12/06/20 1436 12/07/20 1444        Assessment/Plan POD 4 s/p lap extended right colectomy with ileocolonic anastomosis creation on Proximal descending colon mass by Dr. Kieth Brightly and Dr. Radene Knee 9/10  Final path moderately differentiated adenocarcinoma pT3N1a  - CEA wnl at 3.4 - CT chest/abd/pelvis with IV contrast 9/8 with single 1.3 cm lymph node posterior to the colonic lesion likely reflecting local nodal involvement. This also showed suspect benign hepatic cysts but cystic hepatic metastases could not be excluded - MRI liver appears that areas are liver cysts.  - Oncology following. Possible Xeloda as outpatient.  No port needed.   - Adv to FLD - Mobilize - PT rec SNF - Pulm toilet   FEN: FLD ID: [email or web address] periop VTE: Lovenox  Foley - out, voiding    ABL anemia  Atrial fibrillation HTN HLD   LOS: 9 days    Milus Height, MD FACS Surgical Oncology, General Surgery, Trauma and [place] [facility], [place] for weekday/non holidays Check [email or web address] for coverage night/weekend/holidays  Do not use SecureChat as it is not reliable for timely patient care.

## 2020-12-12 NOTE — TOC Progression Note (Signed)
Transition of Care [facility]) - Progression Note    Patient Details  Name: Bradley Stokes MRN: [id number] Date of Birth: January 09, 1946  Transition of [facility]) CM/SW [place], LCSW Phone Number: 12/12/2020, 8:41 AM  Clinical Narrative:    CSW requested [employer] begin insurance authorization [facility] is not managed by [place]). Of note, patient's sister's authorization is still pending.    Expected Discharge Plan:  (outpatient palliative) Barriers to Discharge: Ship broker, Continued Medical Work up  Expected Discharge Plan and Services Expected Discharge Plan:  (outpatient palliative) In-house Referral: Clinical Social Work     Living arrangements for the past 2 months: Single Family Home                                       Social Determinants of Health (SDOH) Interventions    Readmission Risk Interventions No flowsheet data found.

## 2020-12-12 NOTE — Progress Notes (Signed)
PROGRESS NOTE                                                                                                                                                                                                             Patient Demographics:    Bradley Stokes, is a 75 y.o. male, DOB - 05-16-1945, HCW:[id number]  Outpatient Primary MD for the patient is [name], [name], MD    LOS - 9  Admit date - 12/02/2020    Chief Complaint  Patient presents with   Rectal Bleeding       Brief Narrative   Bradley Stokes is a 75 y.o. male with medical history significant of hypertension, hyperlipidemia, paroxysmal atrial fibrillation on Coumadin, osteoarthritis, and obesity who presented with lower GI bleeding-he underwent endoscopic evaluation which revealed a mass in the proximal descending colon-biopsy confirmed adenocarcinoma.  He was subsequently evaluated by surgery and underwent right sided colectomy.   Significant events/studies. -9/7 EGD/colonoscopy -9/8 CT chest/abdomen: 1.3 cm lymph node posterior to colonic lesion, -9/10 lap extended right colectomy with ileocolonic anastomosis creation by Dr. Kieth Brightly and Dr. Radene Knee -9/12 MRI liver: Probable liver cysts-not considered suspicious for metastatic disease.   Subjective:   Tolerating diet-had BM yesterday.   Assessment  & Plan :   Lower GI bleeding due to proximal descending colon mass-s/p right colectomy with ileocolic anastomosis-newly diagnosed adenocarcinoma of the colon-stage IIIb: Lower GI bleeding has resolved-GI/general surgery/oncology consulted during this hospital stay.  Had BM yesterday-diet being advanced.  Oncology planning on further treatment in the outpatient setting after patient undergoes physical rehabilitation.  AKI on CKD stage IIIb: AKI improving-creatinine close to baseline.  PAF: Rate controlled with amiodarone-previously on warfarin-due to potential  interaction with upcoming chemotherapy-has been switched to Eliquis.  Chronic diastolic heart failure: Mild/chronic lower extremity edema present but otherwise compensated.  HLD: Continue statin        Condition -  Guarded  Family Communication  :  None present at bedside , patient NOK , his sister is hospitalized as well.  Code Status :  Full  Consults  :  GI, General surgery  PUD Prophylaxis : PPI  DVT Prophylaxis  :  Eliquis    Procedures  :      -9/7 EGD/colonoscopy -9/10 lap extended right colectomy with ileocolonic anastomosis creation by Dr. Kieth Brightly and  Dr. Radene Knee      Disposition Plan  :    Status is: Inpatient  Remains inpatient appropriate because:IV treatments appropriate due to intensity of illness or inability to take PO  Dispo: The patient is from: Home              Anticipated d/c is to: SNF               Patient currently is not medically stable to d/c.   Difficult to place patient No    Lab Results  Component Value Date   PLT 339 12/12/2020    Diet :  Diet Order             DIET SOFT Room service appropriate? Yes; Fluid consistency: Thin  Diet effective now                    Inpatient Medications  Scheduled Meds:  (feeding supplement) PROSource Plus  30 mL Oral BID BM   allopurinol  100 mg Oral Daily   amiodarone  100 mg Oral Daily   ammonium lactate  1 application Topical BID   apixaban  5 mg Oral BID   atorvastatin  20 mg Oral Daily   calcitRIOL  0.5 mcg Oral Daily   cyanocobalamin  1,000 mcg Subcutaneous Daily   feeding supplement  1 Container Oral TID BM   multivitamin with minerals  1 tablet Oral Daily   pantoprazole  40 mg Oral Daily   sodium chloride flush  3 mL Intravenous Q12H   Continuous Infusions:  promethazine (PHENERGAN) injection (IM or IVPB) 12.5 mg (12/10/20 1440)     PRN Meds:.acetaminophen **OR** acetaminophen, albuterol, metoprolol tartrate, morphine injection, ondansetron **OR** ondansetron  (ZOFRAN) IV, oxyCODONE, promethazine (PHENERGAN) injection (IM or IVPB)  Antibiotics  :    Anti-infectives (From admission, onward)    Start     Dose/Rate Route Frequency Ordered Stop   12/07/20 1445  cefoTEtan (CEFOTAN) 2 g in sodium chloride 0.9 % 100 mL IVPB        2 g 200 mL/hr over 30 Minutes Intravenous On call to O.R. 12/07/20 1355 12/08/20 0559   12/07/20 0600  cefoTEtan (CEFOTAN) 2 g in sodium chloride 0.9 % 100 mL IVPB  Status:  Discontinued        2 g 200 mL/hr over 30 Minutes Intravenous On call to O.R. 12/06/20 1436 12/07/20 2042   12/06/20 2200  neomycin (MYCIFRADIN) tablet 1,000 mg       See Hyperspace for full Linked Orders Report.   1,000 mg Oral 3 times per day 12/06/20 1436 12/07/20 1614   12/06/20 2200  metroNIDAZOLE (FLAGYL) tablet 1,000 mg       See Hyperspace for full Linked Orders Report.   1,000 mg Oral 3 times per day 12/06/20 1436 12/07/20 1444          Joaovictor Krone M.D on 12/12/2020 at 2:12 PM  To page go to [email or web address]   [facility] -  Office  [phone]    See all Orders from today for further details    Objective:   Vitals:   12/12/20 0100 12/12/20 0435 12/12/20 0837 12/12/20 1225  BP: (!) 129/53 (!) 132/55 (!) 136/57 (!) 146/68  Pulse: 82 82 84 73  Resp: 14 14 16 16   Temp: 97.9 F (36.6 C) 98.8 F (37.1 C) 97.6 F (36.4 C) 97.9 F (36.6 C)  TempSrc: Oral Oral Oral Oral  SpO2: 98% 97% 98% 99%  Weight:  Height:        Wt Readings from Last 3 Encounters:  12/11/20 134.5 kg  06/12/20 130.2 kg  12/21/19 135.5 kg     Intake/Output Summary (Last 24 hours) at 12/12/2020 1412 Last data filed at 12/12/2020 0710 Gross per 24 hour  Intake 150 ml  Output 950 ml  Net -800 ml      Physical Exam Gen Exam:Alert awake-not in any distress HEENT:atraumatic, normocephalic Chest: B/L clear to auscultation anteriorly CVS:S1S2 regular Abdomen:soft non tender, non distended Extremities:trace edema Neurology: Non  focal Skin: no rash            Data Review:    CBC Recent Labs  Lab 12/06/20 0144 12/07/20 0255 12/08/20 0026 12/09/20 0555 12/10/20 0027 12/10/20 1616 12/11/20 0035 12/12/20 0037  WBC 9.4 9.9   < > 10.8* 10.7* 12.0* 11.4* 8.8  HGB 8.2* 8.4*   < > 8.1* 7.7* 10.2* 9.7* 9.6*  HCT 26.0* 27.1*   < > 27.7* 25.3* 32.8* 31.1* 31.0*  PLT 297 302   < > 367 363 382 367 339  MCV 100.8* 101.5*   < > 105.3* 102.8* 100.9* 99.0 101.0*  MCH 31.8 31.5   < > 30.8 31.3 31.4 30.9 31.3  MCHC 31.5 31.0   < > 29.2* 30.4 31.1 31.2 31.0  RDW 13.7 14.0   < > 14.3 14.2 15.7* 15.7* 15.2  LYMPHSABS 1.3 1.1  --   --   --  0.8  --   --   MONOABS 0.8 1.1*  --   --   --  1.1*  --   --   EOSABS 0.5 0.3  --   --   --  0.0  --   --   BASOSABS 0.0 0.0  --   --   --  0.0  --   --    < > = values in this interval not displayed.     Recent Labs  Lab 12/06/20 0144 12/07/20 0255 12/08/20 0026 12/09/20 0555 12/10/20 0027 12/11/20 0035 12/12/20 0037  NA 140 137 139 140 139 140 142  K 3.7 3.8 3.7 4.9 4.2 4.3 4.2  CL 106 105 108 111 109 108 111  CO2 25 26 23  18* 23 25 27   GLUCOSE 82 113* 90 105* 122* 102* 111*  BUN 19 14 11 14 17 20 21   CREATININE 2.10* 1.95* 1.79* 1.90* 1.92* 1.84* 1.76*  CALCIUM 8.6* 8.5* 8.3* 8.5* 8.6* 8.6* 8.8*  AST 21 17  --   --   --   --   --   ALT 8 9  --   --   --   --   --   ALKPHOS 93 101  --   --   --   --   --   BILITOT 1.3* 1.2  --   --   --   --   --   ALBUMIN 2.8* 2.7*  --   --   --   --   --   MG 2.1 2.0  --   --   --   --   --   INR 1.2 1.2 1.2 1.1 1.1  --   --   BNP 79.8 80.7  --   --   --   --   --      ------------------------------------------------------------------------------------------------------------------ No results for input(s): CHOL, HDL, LDLCALC, TRIG, CHOLHDL, LDLDIRECT in the last 72 hours.  No results found for: HGBA1C ------------------------------------------------------------------------------------------------------------------ No  results for  input(s): TSH, T4TOTAL, T3FREE, THYROIDAB in the last 72 hours.  Invalid input(s): FREET3  Cardiac Enzymes No results for input(s): CKMB, TROPONINI, MYOGLOBIN in the last 168 hours.  Invalid input(s): CK ------------------------------------------------------------------------------------------------------------------    Component Value Date/Time   BNP 80.7 12/07/2020 0255     Radiology Reports MR LIVER W WO CONTRAST  Result Date: 12/10/2020 CLINICAL DATA:  Colon cancer, status post colectomy, evaluate for hepatic metastases EXAM: MRI ABDOMEN WITHOUT AND WITH CONTRAST TECHNIQUE: Multiplanar multisequence MR imaging of the abdomen was performed both before and after the administration of intravenous contrast. CONTRAST:  20mL GADAVIST GADOBUTROL 1 MMOL/ML IV SOLN COMPARISON:  CT abdomen/pelvis dated 12/06/2020 FINDINGS: Motion degraded images. Specifically, the dynamic postcontrast imaging is markedly limited. Lower chest: Lung bases are clear. Hepatobiliary: 10 mm cyst in the posterior aspect of segment 2 (series 4/image 18), without enhancement following contrast administration, although severely motion degraded. Additional 3 mm cyst in segment 8 (series 4/image 14), not visualized following contrast administration. No suspicious/enhancing hepatic lesions. Gallbladder is unremarkable. No intrahepatic or extrahepatic ductal dilatation. Pancreas:  Within normal limits. Spleen:  Within normal limits. Adrenals/Urinary Tract:  Adrenal glands are within normal limits. Left renal sinus cysts. Additional tiny bilateral renal cysts. No hydronephrosis. Stomach/Bowel: Stomach is within normal limits. Dilated loops of small bowel in the central abdomen, likely reflecting adynamic ileus in this patient status post left colon resection (incompletely visualized). Vascular/Lymphatic:  No evidence of abdominal aortic aneurysm. No suspicious abdominal lymphadenopathy. Other:  No abdominal ascites.  Body  wall edema. Musculoskeletal: No focal osseous lesions. IMPRESSION: Motion degraded images. Two probable cysts in the liver, as above. These are not considered suspicious for metastatic disease. Regardless, given the limitations of the current study, attention on routine follow-up is suggested. Electronically Signed   By: Julian Hy M.D.   On: 12/10/2020 21:38   CT CHEST ABDOMEN PELVIS W CONTRAST  Result Date: 12/06/2020 CLINICAL DATA:  Colon cancer, initial staging. EXAM: CT CHEST, ABDOMEN, AND PELVIS WITH CONTRAST TECHNIQUE: Multidetector CT imaging of the chest, abdomen and pelvis was performed following the standard protocol during bolus administration of intravenous contrast. CONTRAST:  52mL OMNIPAQUE IOHEXOL 350 MG/ML SOLN COMPARISON:  No imaging available for comparison at time of dictation. FINDINGS: CT CHEST FINDINGS Cardiovascular: Aortic and branch vessel atherosclerosis without aneurysmal dilation. Three-vessel coronary artery calcifications. Normal size heart. No significant pericardial effusion/thickening. No central pulmonary embolus. Mediastinum/Nodes: No supraclavicular adenopathy. No discrete thyroid nodule. No pathologically enlarged mediastinal, hilar or axillary lymph nodes. The trachea and esophagus are unremarkable. Lungs/Pleura: No suspicious pulmonary nodules or masses. Bibasilar atelectasis. No pleural effusion. No pneumothorax. Musculoskeletal: Healing left lateral eighth and ninth rib fractures, without associated lesion to suggest pathologic fracture. No aggressive lytic or blastic lesion of bone. CT ABDOMEN PELVIS FINDINGS Hepatobiliary: Hypodense 7 mm lesion in the left lobe of the liver on image 48/3 and a hypodense 4 mm lesion in the right lobe of the liver on image 49/3, incompletely characterized on this examination. Gallbladder is unremarkable. No biliary ductal dilation. Pancreas: No pancreatic ductal dilatation or surrounding inflammatory changes. Spleen: Within normal  limits. Adrenals/Urinary Tract: Bilateral adrenal glands are unremarkable. No hydronephrosis. Bilateral renal cortical thinning. Urinary bladder is unremarkable for degree of distension. Stomach/Bowel: Stomach is decompressed. Periampullary duodenal diverticulum. No pathologic dilation of small or large bowel. Radiopaque enteric contrast traverses the sigmoid colon. Short segment of colonic wall thickening involving the splenic flexure for instance on image 64/3 and 28/4 likely reflects patient's presumed colonic  malignancy. Left-sided colonic diverticulosis without findings of acute diverticulitis. Vascular/Lymphatic: Aorta bi-iliac atherosclerotic calcifications without abdominal aortic aneurysm. 1.3 cm lymph node posterior to the colonic lesion on image 61/3 likely reflecting local nodal involvement. No additional pathologically enlarged abdominal or pelvic lymph nodes. Reproductive: Prostate is unremarkable. Other: No significant abdominopelvic ascites. Nonspecific body wall edema. Musculoskeletal: Multilevel degenerative changes spine. No aggressive lytic or blastic lesion of bone. IMPRESSION: 1. Short segment of colonic wall thickening involving the splenic flexure likely reflects patient's presumed colonic malignancy. 2. Single 1.3 cm lymph node posterior to the colonic lesion likely reflecting local nodal involvement. No additional pathologically enlarged abdominal or pelvic lymph nodes to suggest distant nodal metastases. 3. Two subcentimeter hypodense hepatic lesions, incompletely characterized on this examination. Possibly reflecting benign hepatic cysts but cystic hepatic metastases not excluded on this study. Consider attention on close interval follow-up imaging versus further evaluation with hepatic protocol MRI with and without contrast, preferably as an outpatient when patient is stable and able to follow commands. 4. Healing nonpathologic left lateral eighth and ninth rib fractures. 5. Aortic  Atherosclerosis (ICD10-I70.0). Electronically Signed   By: Dahlia Bailiff M.D.   On: 12/06/2020 20:56   DG CHEST PORT 1 VIEW  Result Date: 12/02/2020 CLINICAL DATA:  Chronic atrial fibrillation. Reported history of rectal bleeding. EXAM: PORTABLE CHEST 1 VIEW COMPARISON:  Chest radiograph dated 07/10/2016. FINDINGS: The heart size is enlarged. Vascular calcifications are seen in the aortic arch. The left costophrenic angle is obscured which may reflect pericardial fat versus a pleural effusion. The right lung is clear and there is no right pleural effusion. There is no pneumothorax. The visualized skeletal structures are unremarkable. IMPRESSION: Cardiomegaly. Obscured left costophrenic angle may represent pericardial fat versus a pleural effusion. Aortic Atherosclerosis (ICD10-I70.0). Electronically Signed   By: Zerita Boers M.D.   On: 12/02/2020 15:11

## 2020-12-12 NOTE — Progress Notes (Signed)
Occupational Therapy Treatment Patient Details Name: Bradley Stokes MRN: [id number] DOB: 05-18-1945 Today's Date: 12/12/2020   History of present illness Pt is a 75yo male presenting to [facility] ED on 9/4 reporting dark stools, likely secondary to GI bleed. Chest xray showed cardiomegaly.  PMH: HTN, PAF, gout, CHF, LE edema.   OT comments  Pt agreeable for participation with session, endorsing generalized fatigue but preference for sitting EOB during session. Continues to require min A for repositioning and min-mod lateral scooting EOB declining transfer to recliner d/t fatigue from 'over night'. Pt demo's overall fair safety completing seated UB grooming and bathing with increased time and intermittent min A for his back. Min A for seated LB ADLs while EOB. Perseverating throughout session this date on 'paying my sisters medicare insurance bill'. Pt with significant concerns on continuing to be the primary caregiver for his sister who he states is also admitted to hospital. Pt would benefit significantly from continues skilled OT services at time of dc at SNF level to maximize indep and safety prior to returning to home. OT will continue to follow acutely.    Recommendations for follow up therapy are one component of a multi-disciplinary discharge planning process, led by the attending physician.  Recommendations may be updated based on patient status, additional functional criteria and insurance authorization.    Follow Up Recommendations  SNF;Supervision/Assistance - 24 hour    Equipment Recommendations   (bariatric 3n1)    Recommendations for Other Services      Precautions / Restrictions Precautions Precautions: Fall Restrictions Weight Bearing Restrictions: No       Mobility Bed Mobility Overal bed mobility: Needs Assistance Bed Mobility: Supine to Sit;Sit to Supine     Supine to sit: Min assist;HOB elevated Sit to supine: Min guard;HOB elevated   General bed mobility comments:  increased time to complete, use of rail min A for only transition of trunk to upright    Transfers     Transfers: Lateral/Scoot Transfers          Lateral/Scoot Transfers: Min assist;From elevated surface General transfer comment: good effort for weight shifting, noted generalized weakness limiting indep.    Balance   Sitting-balance support: No upper extremity supported;Feet supported Sitting balance-Leahy Scale: Good                                     ADL either performed or assessed with clinical judgement   ADL       Grooming: Oral care;Wash/dry hands;Wash/dry face;Brushing hair;Set up;Sitting   Upper Body Bathing: Set up;Sitting   Lower Body Bathing: Minimal assistance;Sitting/lateral leans;Sit to/from stand                               Vision       Perception     Praxis      Cognition Arousal/Alertness: Awake/alert Behavior During Therapy: [facility] for tasks assessed/performed                                   General Comments: perseverating throughout session on paying bills        Exercises     Shoulder Instructions       General Comments      Pertinent Vitals/ Pain       Pain  Assessment: No/denies pain  Home Living                                          Prior Functioning/Environment              Frequency  Min 2X/week        Progress Toward Goals  OT Goals(current goals can now be found in the care plan section)     Acute Rehab OT Goals Patient Stated Goal: to go home OT Goal Formulation: With patient Time For Goal Achievement: 12/17/20 Potential to Achieve Goals: Good  Plan Discharge plan remains appropriate    Co-evaluation                 AM-PAC OT "6 Clicks" Daily Activity     Outcome Measure   Help from another person eating meals?: None Help from another person taking care of personal grooming?: A Little Help from another person toileting,  which includes using toliet, bedpan, or urinal?: A Little Help from another person bathing (including washing, rinsing, drying)?: A Little Help from another person to put on and taking off regular upper body clothing?: A Little Help from another person to put on and taking off regular lower body clothing?: A Little 6 Click Score: 19    End of Session Equipment Utilized During Treatment: Oxygen  OT Visit Diagnosis: Unsteadiness on feet (R26.81);Other abnormalities of gait and mobility (R26.89);Muscle weakness (generalized) (M62.81)   Activity Tolerance Patient tolerated treatment well   Patient Left in bed;with call bell/phone within reach;with bed alarm set   Nurse Communication Mobility status        Time: 2956-2130 OT Time Calculation (min): 24 min  Charges: OT General Charges $OT Visit: 1 Visit OT Treatments $Self Care/Home Management : 23-37 mins  Nahdia Doucet OTR/L acute rehab services Office: [phone]  12/12/2020, 11:22 AM

## 2020-12-13 ENCOUNTER — Other Ambulatory Visit: Payer: Self-pay | Admitting: Oncology

## 2020-12-13 DIAGNOSIS — E7800 Pure hypercholesterolemia, unspecified: Secondary | ICD-10-CM | POA: Diagnosis not present

## 2020-12-13 DIAGNOSIS — E876 Hypokalemia: Secondary | ICD-10-CM | POA: Diagnosis not present

## 2020-12-13 DIAGNOSIS — K219 Gastro-esophageal reflux disease without esophagitis: Secondary | ICD-10-CM | POA: Diagnosis not present

## 2020-12-13 DIAGNOSIS — Z79899 Other long term (current) drug therapy: Secondary | ICD-10-CM | POA: Diagnosis not present

## 2020-12-13 DIAGNOSIS — N1832 Chronic kidney disease, stage 3b: Secondary | ICD-10-CM | POA: Diagnosis not present

## 2020-12-13 DIAGNOSIS — R5381 Other malaise: Secondary | ICD-10-CM | POA: Diagnosis not present

## 2020-12-13 DIAGNOSIS — G894 Chronic pain syndrome: Secondary | ICD-10-CM | POA: Diagnosis not present

## 2020-12-13 DIAGNOSIS — E46 Unspecified protein-calorie malnutrition: Secondary | ICD-10-CM | POA: Diagnosis not present

## 2020-12-13 DIAGNOSIS — R262 Difficulty in walking, not elsewhere classified: Secondary | ICD-10-CM | POA: Diagnosis not present

## 2020-12-13 DIAGNOSIS — R070 Pain in throat: Secondary | ICD-10-CM | POA: Diagnosis not present

## 2020-12-13 DIAGNOSIS — R630 Anorexia: Secondary | ICD-10-CM | POA: Diagnosis not present

## 2020-12-13 DIAGNOSIS — N189 Chronic kidney disease, unspecified: Secondary | ICD-10-CM | POA: Diagnosis not present

## 2020-12-13 DIAGNOSIS — C189 Malignant neoplasm of colon, unspecified: Secondary | ICD-10-CM | POA: Diagnosis not present

## 2020-12-13 DIAGNOSIS — L24A2 Irritant contact dermatitis due to fecal, urinary or dual incontinence: Secondary | ICD-10-CM | POA: Diagnosis not present

## 2020-12-13 DIAGNOSIS — M130 Polyarthritis, unspecified: Secondary | ICD-10-CM | POA: Diagnosis not present

## 2020-12-13 DIAGNOSIS — L853 Xerosis cutis: Secondary | ICD-10-CM | POA: Diagnosis not present

## 2020-12-13 DIAGNOSIS — I129 Hypertensive chronic kidney disease with stage 1 through stage 4 chronic kidney disease, or unspecified chronic kidney disease: Secondary | ICD-10-CM | POA: Diagnosis not present

## 2020-12-13 DIAGNOSIS — I5032 Chronic diastolic (congestive) heart failure: Secondary | ICD-10-CM | POA: Diagnosis not present

## 2020-12-13 DIAGNOSIS — L989 Disorder of the skin and subcutaneous tissue, unspecified: Secondary | ICD-10-CM | POA: Diagnosis not present

## 2020-12-13 DIAGNOSIS — Z7901 Long term (current) use of anticoagulants: Secondary | ICD-10-CM | POA: Diagnosis not present

## 2020-12-13 DIAGNOSIS — D630 Anemia in neoplastic disease: Secondary | ICD-10-CM | POA: Diagnosis not present

## 2020-12-13 DIAGNOSIS — M6281 Muscle weakness (generalized): Secondary | ICD-10-CM | POA: Diagnosis not present

## 2020-12-13 DIAGNOSIS — D631 Anemia in chronic kidney disease: Secondary | ICD-10-CM | POA: Diagnosis not present

## 2020-12-13 DIAGNOSIS — R58 Hemorrhage, not elsewhere classified: Secondary | ICD-10-CM | POA: Diagnosis not present

## 2020-12-13 DIAGNOSIS — R531 Weakness: Secondary | ICD-10-CM | POA: Diagnosis not present

## 2020-12-13 DIAGNOSIS — R609 Edema, unspecified: Secondary | ICD-10-CM | POA: Diagnosis not present

## 2020-12-13 DIAGNOSIS — S91104A Unspecified open wound of right lesser toe(s) without damage to nail, initial encounter: Secondary | ICD-10-CM | POA: Diagnosis not present

## 2020-12-13 DIAGNOSIS — N179 Acute kidney failure, unspecified: Secondary | ICD-10-CM | POA: Diagnosis not present

## 2020-12-13 DIAGNOSIS — R600 Localized edema: Secondary | ICD-10-CM | POA: Diagnosis not present

## 2020-12-13 DIAGNOSIS — N184 Chronic kidney disease, stage 4 (severe): Secondary | ICD-10-CM | POA: Diagnosis not present

## 2020-12-13 DIAGNOSIS — R52 Pain, unspecified: Secondary | ICD-10-CM | POA: Diagnosis not present

## 2020-12-13 DIAGNOSIS — R627 Adult failure to thrive: Secondary | ICD-10-CM | POA: Diagnosis not present

## 2020-12-13 DIAGNOSIS — E43 Unspecified severe protein-calorie malnutrition: Secondary | ICD-10-CM | POA: Diagnosis not present

## 2020-12-13 DIAGNOSIS — J90 Pleural effusion, not elsewhere classified: Secondary | ICD-10-CM | POA: Diagnosis not present

## 2020-12-13 DIAGNOSIS — Z7401 Bed confinement status: Secondary | ICD-10-CM | POA: Diagnosis not present

## 2020-12-13 DIAGNOSIS — L309 Dermatitis, unspecified: Secondary | ICD-10-CM | POA: Diagnosis not present

## 2020-12-13 DIAGNOSIS — R110 Nausea: Secondary | ICD-10-CM | POA: Diagnosis not present

## 2020-12-13 DIAGNOSIS — Z23 Encounter for immunization: Secondary | ICD-10-CM | POA: Diagnosis not present

## 2020-12-13 DIAGNOSIS — I872 Venous insufficiency (chronic) (peripheral): Secondary | ICD-10-CM | POA: Diagnosis not present

## 2020-12-13 DIAGNOSIS — R002 Palpitations: Secondary | ICD-10-CM | POA: Diagnosis not present

## 2020-12-13 DIAGNOSIS — D649 Anemia, unspecified: Secondary | ICD-10-CM | POA: Diagnosis not present

## 2020-12-13 DIAGNOSIS — I4891 Unspecified atrial fibrillation: Secondary | ICD-10-CM | POA: Diagnosis not present

## 2020-12-13 DIAGNOSIS — J189 Pneumonia, unspecified organism: Secondary | ICD-10-CM | POA: Diagnosis not present

## 2020-12-13 DIAGNOSIS — Z6841 Body Mass Index (BMI) 40.0 and over, adult: Secondary | ICD-10-CM | POA: Diagnosis not present

## 2020-12-13 DIAGNOSIS — I959 Hypotension, unspecified: Secondary | ICD-10-CM | POA: Diagnosis not present

## 2020-12-13 DIAGNOSIS — Z9049 Acquired absence of other specified parts of digestive tract: Secondary | ICD-10-CM | POA: Diagnosis not present

## 2020-12-13 DIAGNOSIS — I739 Peripheral vascular disease, unspecified: Secondary | ICD-10-CM | POA: Diagnosis not present

## 2020-12-13 DIAGNOSIS — C186 Malignant neoplasm of descending colon: Secondary | ICD-10-CM

## 2020-12-13 DIAGNOSIS — E860 Dehydration: Secondary | ICD-10-CM | POA: Diagnosis not present

## 2020-12-13 DIAGNOSIS — Z48815 Encounter for surgical aftercare following surgery on the digestive system: Secondary | ICD-10-CM | POA: Diagnosis not present

## 2020-12-13 DIAGNOSIS — R051 Acute cough: Secondary | ICD-10-CM | POA: Diagnosis not present

## 2020-12-13 DIAGNOSIS — R0600 Dyspnea, unspecified: Secondary | ICD-10-CM | POA: Diagnosis not present

## 2020-12-13 DIAGNOSIS — S91104D Unspecified open wound of right lesser toe(s) without damage to nail, subsequent encounter: Secondary | ICD-10-CM | POA: Diagnosis not present

## 2020-12-13 DIAGNOSIS — E785 Hyperlipidemia, unspecified: Secondary | ICD-10-CM | POA: Diagnosis not present

## 2020-12-13 DIAGNOSIS — Z515 Encounter for palliative care: Secondary | ICD-10-CM | POA: Diagnosis not present

## 2020-12-13 DIAGNOSIS — R1084 Generalized abdominal pain: Secondary | ICD-10-CM | POA: Diagnosis not present

## 2020-12-13 DIAGNOSIS — R5383 Other fatigue: Secondary | ICD-10-CM | POA: Diagnosis not present

## 2020-12-13 DIAGNOSIS — I480 Paroxysmal atrial fibrillation: Secondary | ICD-10-CM | POA: Diagnosis not present

## 2020-12-13 DIAGNOSIS — K922 Gastrointestinal hemorrhage, unspecified: Secondary | ICD-10-CM | POA: Diagnosis not present

## 2020-12-13 DIAGNOSIS — L98411 Non-pressure chronic ulcer of buttock limited to breakdown of skin: Secondary | ICD-10-CM | POA: Diagnosis not present

## 2020-12-13 DIAGNOSIS — N183 Chronic kidney disease, stage 3 unspecified: Secondary | ICD-10-CM | POA: Diagnosis not present

## 2020-12-13 DIAGNOSIS — R0789 Other chest pain: Secondary | ICD-10-CM | POA: Diagnosis not present

## 2020-12-13 DIAGNOSIS — N2581 Secondary hyperparathyroidism of renal origin: Secondary | ICD-10-CM | POA: Diagnosis not present

## 2020-12-13 DIAGNOSIS — L908 Other atrophic disorders of skin: Secondary | ICD-10-CM | POA: Diagnosis not present

## 2020-12-13 DIAGNOSIS — R0989 Other specified symptoms and signs involving the circulatory and respiratory systems: Secondary | ICD-10-CM | POA: Diagnosis not present

## 2020-12-13 DIAGNOSIS — M25572 Pain in left ankle and joints of left foot: Secondary | ICD-10-CM | POA: Diagnosis not present

## 2020-12-13 MED ORDER — APIXABAN 5 MG PO TABS: 5.0000 mg | ORAL_TABLET | Freq: Two times a day (BID) | ORAL | Status: DC

## 2020-12-13 MED ORDER — DOCUSATE SODIUM 100 MG PO CAPS
100.0000 mg | ORAL_CAPSULE | Freq: Two times a day (BID) | ORAL | Status: DC
  Administered 2020-12-13: 100 mg via ORAL
  Filled 2020-12-13: qty 1

## 2020-12-13 MED ORDER — PROSOURCE PLUS PO LIQD: 30.0000 mL | Freq: Three times a day (TID) | ORAL | Status: DC

## 2020-12-13 MED ORDER — ALBUTEROL SULFATE (2.5 MG/3ML) 0.083% IN NEBU: 2.5000 mg | INHALATION_SOLUTION | Freq: Four times a day (QID) | RESPIRATORY_TRACT | 12 refills | Status: DC | PRN

## 2020-12-13 MED ORDER — POLYETHYLENE GLYCOL 3350 17 G PO PACK: 17.0000 g | PACK | Freq: Every day | ORAL | Status: DC | PRN

## 2020-12-13 MED ORDER — PANTOPRAZOLE SODIUM 40 MG PO TBEC: 40.0000 mg | DELAYED_RELEASE_TABLET | Freq: Every day | ORAL | Status: AC

## 2020-12-13 NOTE — Progress Notes (Signed)
Nutrition Follow-up  DOCUMENTATION CODES:   Morbid obesity  INTERVENTION:   -D/c Boost Breeze -Increase 30 ml Prosource Plus to TID, each supplement provides 100 kcals and 15 grams protein -Continue Magic cup TID with meals, each supplement provides 290 kcal and 9 grams of protein  -Hormel Shake BID with meals, each supplement provides 520 kcals and 22 grams protein -Continue MVI with minerals daily -Liberalize diet to regular  NUTRITION DIAGNOSIS:   Inadequate oral intake related to nausea, poor appetite as evidenced by meal completion < 50%.  Ongoing  GOAL:   Patient will meet greater than or equal to 90% of their needs  Progressing   MONITOR:   Diet advancement, Supplement acceptance, PO intake, Labs, Weight trends, Skin, I & O's  REASON FOR ASSESSMENT:   NPO/Clear Liquid Diet    ASSESSMENT:   75 y.o. male with medical history significant of hypertension, hyperlipidemia, paroxysmal atrial fibrillation on Coumadin, osteoarthritis, and obesity who presents with complaints of bleeding rectally. Endoscopy/colonoscopy 9/7, which was significant for malignant 5 cm tumor in the proximal descending colon which was biopsied and was significant for malignancy. 9/10 lap extended right colectomy with ileocolonic anastomosis  9/11- advanced to clear liquid diet 9/13- advanced to full liquid diet 9/15- advanced to heart healthy diet   Reviewed I/O's: -800 ml x 24 hours and +2.9 L since admission  Pt unavailable at time of visit.   Per oncology notes, pathology revealed moderately differentiated carcinoma, T3, 1 positive lymph nodes, surgical margins 1 negative. Plan for chemotherapy.   Pt with poor oral intake. Noted meal completions 25-40%. Pt is refusing Boost Breeze supplements. Per RN notes, all Boost and Ensure products make him sick. He is taking Prosource Plus supplements.   Per TOC notes, plan to d/c to SNF at discharge.   Medications reviewed and include colace.    Labs reviewed.   Diet Order:   Diet Order             Diet Heart Room service appropriate? Yes; Fluid consistency: Thin  Diet effective now                   EDUCATION NEEDS:   Not appropriate for education at this time  Skin:  Skin Assessment: Skin Integrity Issues: Skin Integrity Issues:: Incisions Incisions: abdomen  Last BM:  12/10/20  Height:   Ht Readings from Last 1 Encounters:  12/08/20 5\' 9"  (1.753 m)    Weight:   Wt Readings from Last 1 Encounters:  12/13/20 132.3 kg   BMI:  Body mass index is 43.07 kg/m.  Estimated Nutritional Needs:   Kcal:  2100-2300  Protein:  115-130 grams  Fluid:  >/= 2 L/day    Loistine Chance, RD, LDN, [place] Registered Dietitian II Certified Diabetes Care and Education Specialist Please refer to [facility] for RD and/or RD on-call/weekend/after hours pager

## 2020-12-13 NOTE — Discharge Summary (Signed)
PATIENT DETAILS Name: Bradley Stokes Age: 75 y.o. Sex: male Date of Birth: February 26, 1946 MRN: [id number]. Admitting Physician: Norval Morton, MD CZY:[id number], [name], MD  Admit Date: 12/02/2020 Discharge date: 12/13/2020  Recommendations for Outpatient Follow-up:  Follow up with PCP in 1-2 weeks Please obtain CMP/CBC in one week Please ensure follow-up with oncology and general surgery.   Admitted From:  Home  Disposition: SNF   Home Health: No  Equipment/Devices: None  Discharge Condition: Stable  CODE STATUS: DNR  Diet recommendation:  Diet Order             Diet regular Room service appropriate? Yes with Assist; Fluid consistency: Thin  Diet effective now           Diet - low sodium heart healthy                    Brief Summary: Brief Narrative   Bradley Stokes is a 75 y.o. male with medical history significant of hypertension, hyperlipidemia, paroxysmal atrial fibrillation on Coumadin, osteoarthritis, and obesity who presented with lower GI bleeding-he underwent endoscopic evaluation which revealed a mass in the proximal descending colon-biopsy confirmed adenocarcinoma.  He was subsequently evaluated by surgery and underwent right sided colectomy.    Significant events/studies. -9/7 EGD/colonoscopy -9/8 CT chest/abdomen: 1.3 cm lymph node posterior to colonic lesion, -9/10 lap extended right colectomy with ileocolonic anastomosis creation by Dr. Kieth Brightly and Dr. Radene Knee -9/12 MRI liver: Probable liver cysts-not considered suspicious for metastatic disease.  Brief Hospital Course: Lower GI bleeding due to proximal descending colon mass-s/p right colectomy with ileocolic anastomosis-newly diagnosed adenocarcinoma of the colon-stage IIIb: Lower GI bleeding has resolved-GI/general surgery/oncology consulted during this hospital stay.  Had BM yesterday and 1 again today-tolerating advancement in diet.  Oncology planning on further treatment in the  outpatient setting after patient undergoes physical rehabilitation.  General surgery will arrange follow-up in the clinic in a week or so for staple removal.   AKI on CKD stage IIIb: AKI improving-creatinine close to baseline.   PAF: Rate controlled with amiodarone-previously on warfarin-due to potential interaction with upcoming chemotherapy-has been switched to Eliquis.   Chronic diastolic heart failure: Mild/chronic lower extremity edema present but otherwise compensated.  Resume oral Lasix   HLD: Continue statin  Obesity: Estimated body mass index is 43.07 kg/m as calculated from the following:   Height as of this encounter: 5\' 9"  (1.753 m).   Weight as of this encounter: 132.3 kg.    Procedures/ -9/7 EGD/colonoscopy 9/10 lap extended right colectomy with ileocolonic anastomosis creation  Discharge Diagnoses:  Principal Problem:   GI bleed Active Problems:   PAF (paroxysmal atrial fibrillation) ([place])   Long term current use of anticoagulant therapy   Hyperlipidemia   (HFpEF) heart failure with preserved ejection fraction (HCC)   Acute blood loss anemia   Anemia   Cancer of left colon [facility])   Discharge Instructions:  Activity:  As tolerated with Full fall precautions use walker/cane & assistance as needed  Discharge Instructions     Diet - low sodium heart healthy   Complete by: As directed    Discharge instructions   Complete by: As directed    Follow with Primary MD  [name], [name], MD in 1-2 weeks  Please follow with oncology as instructed  Please follow with general surgery as instructed  Please get a complete blood count and chemistry panel checked by your Primary MD at your next visit, and again as  instructed by your Primary MD.  Get Medicines reviewed and adjusted: Please take all your medications with you for your next visit with your Primary MD  Laboratory/radiological data: Please request your Primary MD to go over all hospital tests and  procedure/radiological results at the follow up, please ask your Primary MD to get all Hospital records sent to his/her office.  In some cases, they will be blood work, cultures and biopsy results pending at the time of your discharge. Please request that your primary care M.D. follows up on these results.  Also Note the following: If you experience worsening of your admission symptoms, develop shortness of breath, life threatening emergency, suicidal or homicidal thoughts you must seek medical attention immediately by calling 911 or calling your MD immediately  if symptoms less severe.  You must read complete instructions/literature along with all the possible adverse reactions/side effects for all the Medicines you take and that have been prescribed to you. Take any new Medicines after you have completely understood and accpet all the possible adverse reactions/side effects.   Do not drive when taking Pain medications or sleeping medications (Benzodaizepines)  Do not take more than prescribed Pain, Sleep and Anxiety Medications. It is not advisable to combine anxiety,sleep and pain medications without talking with your primary care practitioner  Special Instructions: If you have smoked or chewed Tobacco  in the last 2 yrs please stop smoking, stop any regular Alcohol  and or any Recreational drug use.  Wear Seat belts while driving.  Please note: You were cared for by a hospitalist during your hospital stay. Once you are discharged, your primary care physician will handle any further medical issues. Please note that NO REFILLS for any discharge medications will be authorized once you are discharged, as it is imperative that you return to your primary care physician (or establish a relationship with a primary care physician if you do not have one) for your post hospital discharge needs so that they can reassess your need for medications and monitor your lab values.   Increase activity slowly    Complete by: As directed    No dressing needed   Complete by: As directed       Allergies as of 12/13/2020       Reactions   Other    Tape,needs paper tape        Medication List     STOP taking these medications    warfarin 2 MG tablet Commonly known as: COUMADIN       TAKE these medications    (feeding supplement) PROSource Plus liquid Take 30 mLs by mouth 3 (three) times daily between meals.   acetaminophen 325 MG tablet Commonly known as: TYLENOL Take 325 mg by mouth every 6 (six) hours as needed (pain).   albuterol (2.5 MG/3ML) 0.083% nebulizer solution Commonly known as: PROVENTIL Take 3 mLs (2.5 mg total) by nebulization every 6 (six) hours as needed for wheezing or shortness of breath.   allopurinol 100 MG tablet Commonly known as: ZYLOPRIM Take 100 mg by mouth daily.   amiodarone 200 MG tablet Commonly known as: PACERONE 1/2 tablet daily What changed:  how much to take how to take this when to take this   ammonium lactate 12 % lotion Commonly known as: LAC-HYDRIN Apply 1 application topically 2 (two) times daily.   apixaban 5 MG Tabs tablet Commonly known as: ELIQUIS Take 1 tablet (5 mg total) by mouth 2 (two) times daily.   atorvastatin 20  MG tablet Commonly known as: LIPITOR Take 20 mg by mouth Daily.   calcitRIOL 0.5 MCG capsule Commonly known as: ROCALTROL Take 0.5 mcg daily by mouth.   furosemide 20 MG tablet Commonly known as: LASIX TAKE 2 TABLETS BY MOUTH TWICE DAILY What changed: when to take this   pantoprazole 40 MG tablet Commonly known as: PROTONIX Take 1 tablet (40 mg total) by mouth daily. Start taking on: December 14, 2020   potassium chloride SA 20 MEQ tablet Commonly known as: KLOR-CON TAKE 1 TABLET BY MOUTH DAILY What changed:  how much to take how to take this when to take this   Virt-Phos 250 Neutral [phone] MG Tabs Take 1 tablet by mouth 2 (two) times a day.               Discharge Care  Instructions  (From admission, onward)           Start     Ordered   12/13/20 0000  No dressing needed        12/13/20 1055            Contact information for follow-up providers     [name], [name], MD. Schedule an appointment as soon as possible for a visit in 1 week(s).   Specialty: Family Medicine Contact information: [address] [address] [phone]         Jerline Pain, MD Follow up in 1 month(s).   Specialty: Cardiology Contact information: [address] [place] [address] [phone]         Truitt Merle, MD Follow up.   Specialties: Hematology, Oncology Why: Office will call with date/time, If you dont hear from them,please give them a call Contact information: [place] [address] [phone]         [facility] Follow up in 1 week(s).   Why: For suture removal, For wound re-check Contact information: [address] [place] [phone]             Contact information for after-discharge care     Destination     [facility] .   Service: Skilled Nursing Contact information: [address] [place] [phone]                    Allergies  Allergen Reactions   Other     Tape,needs paper tape      Consultations: [facility] GI Oncology   Other Procedures/Studies: MR LIVER W WO CONTRAST  Result Date: 12/10/2020 CLINICAL DATA:  Colon cancer, status post colectomy, evaluate for hepatic metastases EXAM: MRI ABDOMEN WITHOUT AND WITH CONTRAST TECHNIQUE: Multiplanar multisequence MR imaging of the abdomen was performed both before and after the administration of intravenous contrast. CONTRAST:  69mL GADAVIST GADOBUTROL 1 MMOL/ML IV SOLN COMPARISON:  CT abdomen/pelvis dated 12/06/2020 FINDINGS: Motion degraded images. Specifically, the dynamic  postcontrast imaging is markedly limited. Lower chest: Lung bases are clear. Hepatobiliary: 10 mm cyst in the posterior aspect of segment 2 (series 4/image 18), without enhancement following contrast administration, although severely motion degraded. Additional 3 mm cyst in segment 8 (series 4/image 14), not visualized following contrast administration. No suspicious/enhancing hepatic lesions. Gallbladder is unremarkable. No intrahepatic or extrahepatic ductal dilatation. Pancreas:  Within normal limits. Spleen:  Within normal limits. Adrenals/Urinary Tract:  Adrenal glands are within normal limits. Left renal sinus cysts. Additional tiny  bilateral renal cysts. No hydronephrosis. Stomach/Bowel: Stomach is within normal limits. Dilated loops of small bowel in the central abdomen, likely reflecting adynamic ileus in this patient status post left colon resection (incompletely visualized). Vascular/Lymphatic:  No evidence of abdominal aortic aneurysm. No suspicious abdominal lymphadenopathy. Other:  No abdominal ascites.  Body wall edema. Musculoskeletal: No focal osseous lesions. IMPRESSION: Motion degraded images. Two probable cysts in the liver, as above. These are not considered suspicious for metastatic disease. Regardless, given the limitations of the current study, attention on routine follow-up is suggested. Electronically Signed   By: Julian Hy M.D.   On: 12/10/2020 21:38   CT CHEST ABDOMEN PELVIS W CONTRAST  Result Date: 12/06/2020 CLINICAL DATA:  Colon cancer, initial staging. EXAM: CT CHEST, ABDOMEN, AND PELVIS WITH CONTRAST TECHNIQUE: Multidetector CT imaging of the chest, abdomen and pelvis was performed following the standard protocol during bolus administration of intravenous contrast. CONTRAST:  45mL OMNIPAQUE IOHEXOL 350 MG/ML SOLN COMPARISON:  No imaging available for comparison at time of dictation. FINDINGS: CT CHEST FINDINGS Cardiovascular: Aortic and branch vessel atherosclerosis  without aneurysmal dilation. Three-vessel coronary artery calcifications. Normal size heart. No significant pericardial effusion/thickening. No central pulmonary embolus. Mediastinum/Nodes: No supraclavicular adenopathy. No discrete thyroid nodule. No pathologically enlarged mediastinal, hilar or axillary lymph nodes. The trachea and esophagus are unremarkable. Lungs/Pleura: No suspicious pulmonary nodules or masses. Bibasilar atelectasis. No pleural effusion. No pneumothorax. Musculoskeletal: Healing left lateral eighth and ninth rib fractures, without associated lesion to suggest pathologic fracture. No aggressive lytic or blastic lesion of bone. CT ABDOMEN PELVIS FINDINGS Hepatobiliary: Hypodense 7 mm lesion in the left lobe of the liver on image 48/3 and a hypodense 4 mm lesion in the right lobe of the liver on image 49/3, incompletely characterized on this examination. Gallbladder is unremarkable. No biliary ductal dilation. Pancreas: No pancreatic ductal dilatation or surrounding inflammatory changes. Spleen: Within normal limits. Adrenals/Urinary Tract: Bilateral adrenal glands are unremarkable. No hydronephrosis. Bilateral renal cortical thinning. Urinary bladder is unremarkable for degree of distension. Stomach/Bowel: Stomach is decompressed. Periampullary duodenal diverticulum. No pathologic dilation of small or large bowel. Radiopaque enteric contrast traverses the sigmoid colon. Short segment of colonic wall thickening involving the splenic flexure for instance on image 64/3 and 28/4 likely reflects patient's presumed colonic malignancy. Left-sided colonic diverticulosis without findings of acute diverticulitis. Vascular/Lymphatic: Aorta bi-iliac atherosclerotic calcifications without abdominal aortic aneurysm. 1.3 cm lymph node posterior to the colonic lesion on image 61/3 likely reflecting local nodal involvement. No additional pathologically enlarged abdominal or pelvic lymph nodes. Reproductive:  Prostate is unremarkable. Other: No significant abdominopelvic ascites. Nonspecific body wall edema. Musculoskeletal: Multilevel degenerative changes spine. No aggressive lytic or blastic lesion of bone. IMPRESSION: 1. Short segment of colonic wall thickening involving the splenic flexure likely reflects patient's presumed colonic malignancy. 2. Single 1.3 cm lymph node posterior to the colonic lesion likely reflecting local nodal involvement. No additional pathologically enlarged abdominal or pelvic lymph nodes to suggest distant nodal metastases. 3. Two subcentimeter hypodense hepatic lesions, incompletely characterized on this examination. Possibly reflecting benign hepatic cysts but cystic hepatic metastases not excluded on this study. Consider attention on close interval follow-up imaging versus further evaluation with hepatic protocol MRI with and without contrast, preferably as an outpatient when patient is stable and able to follow commands. 4. Healing nonpathologic left lateral eighth and ninth rib fractures. 5. Aortic Atherosclerosis (ICD10-I70.0). Electronically Signed   By: Dahlia Bailiff M.D.   On: 12/06/2020 20:56   DG CHEST PORT 1  VIEW  Result Date: 12/02/2020 CLINICAL DATA:  Chronic atrial fibrillation. Reported history of rectal bleeding. EXAM: PORTABLE CHEST 1 VIEW COMPARISON:  Chest radiograph dated 07/10/2016. FINDINGS: The heart size is enlarged. Vascular calcifications are seen in the aortic arch. The left costophrenic angle is obscured which may reflect pericardial fat versus a pleural effusion. The right lung is clear and there is no right pleural effusion. There is no pneumothorax. The visualized skeletal structures are unremarkable. IMPRESSION: Cardiomegaly. Obscured left costophrenic angle may represent pericardial fat versus a pleural effusion. Aortic Atherosclerosis (ICD10-I70.0). Electronically Signed   By: Zerita Boers M.D.   On: 12/02/2020 15:11     TODAY-DAY OF  DISCHARGE:  Subjective:   Jentzen Minasyan today has no headache,no chest abdominal pain,no new weakness tingling or numbness, feels much better wants to go home today.  Objective:   Blood pressure 139/69, pulse 94, temperature 98.2 F (36.8 C), temperature source Oral, resp. rate (!) 24, height 5\' 9"  (1.753 m), weight 132.3 kg, SpO2 96 %.  Intake/Output Summary (Last 24 hours) at 12/13/2020 1056 Last data filed at 12/13/2020 0915 Gross per 24 hour  Intake 160 ml  Output 950 ml  Net -790 ml   Filed Weights   12/10/20 0617 12/11/20 0635 12/13/20 0336  Weight: 125.4 kg 134.5 kg 132.3 kg    Exam: Awake Alert, Oriented *3, No new F.N deficits, Normal affect [place].AT,PERRAL Supple Neck,No JVD, No cervical lymphadenopathy appriciated.  Symmetrical Chest wall movement, Good air movement bilaterally, CTAB RRR,No Gallops,Rubs or new Murmurs, No Parasternal Heave +ve B.Sounds, Abd Soft, Non tender, No organomegaly appriciated, No rebound -guarding or rigidity. No Cyanosis, Clubbing or edema, No new Rash or bruise   PERTINENT RADIOLOGIC STUDIES: No results found.   PERTINENT LAB RESULTS: CBC: Recent Labs    12/11/20 0035 12/12/20 0037  WBC 11.4* 8.8  HGB 9.7* 9.6*  HCT 31.1* 31.0*  PLT 367 339   CMET CMP     Component Value Date/Time   NA 142 12/12/2020 0037   NA 142 06/12/2020 1126   K 4.2 12/12/2020 0037   CL 111 12/12/2020 0037   CO2 27 12/12/2020 0037   GLUCOSE 111 (H) 12/12/2020 0037   BUN 21 12/12/2020 0037   BUN 18 06/12/2020 1126   CREATININE 1.76 (H) 12/12/2020 0037   CREATININE 1.79 (H) 01/01/2015 1234   CALCIUM 8.8 (L) 12/12/2020 0037   PROT 5.7 (L) 12/07/2020 0255   PROT 6.5 06/12/2020 1126   ALBUMIN 2.7 (L) 12/07/2020 0255   ALBUMIN 3.9 06/12/2020 1126   AST 17 12/07/2020 0255   ALT 9 12/07/2020 0255   ALKPHOS 101 12/07/2020 0255   BILITOT 1.2 12/07/2020 0255   BILITOT 0.6 06/12/2020 1126   GFRNONAA 39 (L) 12/12/2020 0037   GFRAA 38 (L) 12/21/2019  1155    GFR Estimated Creatinine Clearance: 47.4 mL/min (A) (by C-G formula based on SCr of 1.76 mg/dL (H)). No results for input(s): LIPASE, AMYLASE in the last 72 hours. No results for input(s): CKTOTAL, CKMB, CKMBINDEX, TROPONINI in the last 72 hours. Invalid input(s): POCBNP No results for input(s): DDIMER in the last 72 hours. No results for input(s): HGBA1C in the last 72 hours. No results for input(s): CHOL, HDL, LDLCALC, TRIG, CHOLHDL, LDLDIRECT in the last 72 hours. No results for input(s): TSH, T4TOTAL, T3FREE, THYROIDAB in the last 72 hours.  Invalid input(s): FREET3 Recent Labs    12/11/20 0035  FERRITIN 72  TIBC 251  IRON 112   Coags: No  results for input(s): INR in the last 72 hours.  Invalid input(s): PT Microbiology: Recent Results (from the past 240 hour(s))  SARS CORONAVIRUS 2 (TAT 6-24 HRS) Nasopharyngeal Nasopharyngeal Swab     Status: None   Collection Time: 12/12/20 12:00 PM   Specimen: Nasopharyngeal Swab  Result Value Ref Range Status   SARS Coronavirus 2 NEGATIVE NEGATIVE Final    Comment: (NOTE) SARS-CoV-2 target nucleic acids are NOT DETECTED.  The SARS-CoV-2 RNA is generally detectable in upper and lower respiratory specimens during the acute phase of infection. Negative results do not preclude SARS-CoV-2 infection, do not rule out co-infections with other pathogens, and should not be used as the sole basis for treatment or other patient management decisions. Negative results must be combined with clinical observations, patient history, and epidemiological information. The expected result is Negative.  Fact Sheet for Patients: SugarRoll.be  Fact Sheet for Healthcare Providers: https://www.woods-mathews.com/  This test is not yet approved or cleared by the [place] FDA and  has been authorized for detection and/or diagnosis of SARS-CoV-2 by FDA under an Emergency Use Authorization (EUA). This  EUA will remain  in effect (meaning this test can be used) for the duration of the COVID-19 declaration under Se ction 564(b)(1) of the Act, 21 U.S.C. section 360bbb-3(b)(1), unless the authorization is terminated or revoked sooner.  Performed at [facility], [place] [address], [place], [place] [address]     FURTHER DISCHARGE INSTRUCTIONS:  Get Medicines reviewed and adjusted: Please take all your medications with you for your next visit with your Primary MD  Laboratory/radiological data: Please request your Primary MD to go over all hospital tests and procedure/radiological results at the follow up, please ask your Primary MD to get all Hospital records sent to his/her office.  In some cases, they will be blood work, cultures and biopsy results pending at the time of your discharge. Please request that your primary care M.D. goes through all the records of your hospital data and follows up on these results.  Also Note the following: If you experience worsening of your admission symptoms, develop shortness of breath, life threatening emergency, suicidal or homicidal thoughts you must seek medical attention immediately by calling 911 or calling your MD immediately  if symptoms less severe.  You must read complete instructions/literature along with all the possible adverse reactions/side effects for all the Medicines you take and that have been prescribed to you. Take any new Medicines after you have completely understood and accpet all the possible adverse reactions/side effects.   Do not drive when taking Pain medications or sleeping medications (Benzodaizepines)  Do not take more than prescribed Pain, Sleep and Anxiety Medications. It is not advisable to combine anxiety,sleep and pain medications without talking with your primary care practitioner  Special Instructions: If you have smoked or chewed Tobacco  in the last 2 yrs please stop smoking, stop any regular Alcohol  and or any  Recreational drug use.  Wear Seat belts while driving.  Please note: You were cared for by a hospitalist during your hospital stay. Once you are discharged, your primary care physician will handle any further medical issues. Please note that NO REFILLS for any discharge medications will be authorized once you are discharged, as it is imperative that you return to your primary care physician (or establish a relationship with a primary care physician if you do not have one) for your post hospital discharge needs so that they can reassess your need for medications  and monitor your lab values.  Total Time spent coordinating discharge including counseling, education and face to face time equals 35 minutes.  SignedOren Binet 12/13/2020 10:56 AM

## 2020-12-13 NOTE — Progress Notes (Signed)
Physical Therapy Treatment Patient Details Name: [facility] MRN: [id number] DOB: [birth year] Today's Date: 12/13/2020   History of Present Illness Pt is a 75yo male presenting to [facility] ED on 9/4 reporting dark stools, likely secondary to GI bleed. Chest xray showed cardiomegaly.  PMH: HTN, PAF, gout, CHF, LE edema.    PT Comments    Patient alert and oriented and agreeable to PT session. He did not want to get to chair due to difficulty getting out of chair, however very willing to work from EOB on standing. Appreciative of pre-medication for bil knee pain prior to session.     Recommendations for follow up therapy are one component of a multi-disciplinary discharge planning process, led by the attending physician.  Recommendations may be updated based on patient status, additional functional criteria and insurance authorization.  Follow Up Recommendations  SNF;Supervision for mobility/OOB     Equipment Recommendations  3in1 (PT);Rolling walker with 5" wheels (bariatric)    Recommendations for Other Services       Precautions / Restrictions Precautions Precautions: Fall Precaution Comments: Pt reports a fall ~1 month ago where he fell into a RW on his left side and still reports some L knee swelling.     Mobility  Bed Mobility Overal bed mobility: Needs Assistance Bed Mobility: Supine to Sit;Rolling Rolling: Min assist   Supine to sit: Min assist     General bed mobility comments: rolling rt and lt for cleaning prior to OOB (incontinence of BM); assist to raise torso (pt pulls on PT UE for support and uses rail)    Transfers Overall transfer level: Needs assistance Equipment used: Rolling walker (2 wheeled) Transfers: Sit to/from Stand Sit to Stand: From elevated surface;Min assist         General transfer comment: good effort for weight shifting, noted generalized weakness limiting indep.  Ambulation/Gait Ambulation/Gait assistance: Min assist Gait Distance  (Feet): 1 Feet Assistive device: Rolling walker (2 wheeled) Gait Pattern/deviations: Antalgic;Decreased stride length;Step-to pattern Gait velocity: decreased   General Gait Details: side steps toward HOB with bil knees "popping" due to arthritis   Stairs             Wheelchair Mobility    Modified Rankin (Stroke Patients Only)       Balance Overall balance assessment: Needs assistance Sitting-balance support: No upper extremity supported;Feet supported Sitting balance-Leahy Scale: Good     Standing balance support: Bilateral upper extremity supported;During functional activity Standing balance-Leahy Scale: Poor Standing balance comment: reliant on external assist                            Cognition Arousal/Alertness: Awake/alert Behavior During Therapy: WFL for tasks assessed/performed Overall Cognitive Status: No family/caregiver present to determine baseline cognitive functioning                                 General Comments: appropriate cognition during session      Exercises      General Comments General comments (skin integrity, edema, etc.): pt refused OOB to chair; wanted to return to sit on EOB to eat his lunch      Pertinent Vitals/Pain Pain Assessment: Faces Faces Pain Scale: Hurts little more Pain Location: bilat knees during amb Pain Descriptors / Indicators: Discomfort;Sore Pain Intervention(s): Premedicated before session;Limited activity within patient's tolerance    Home Living  Prior Function            PT Goals (current goals can now be found in the care plan section) Acute Rehab PT Goals Patient Stated Goal: to go home PT Goal Formulation: With patient Time For Goal Achievement: 12/17/20 Potential to Achieve Goals: Good Progress towards PT goals: Progressing toward goals    Frequency    Min 2X/week      PT Plan Current plan remains appropriate     Co-evaluation              AM-PAC PT "6 Clicks" Mobility   Outcome Measure  Help needed turning from your back to your side while in a flat bed without using bedrails?: A Little Help needed moving from lying on your back to sitting on the side of a flat bed without using bedrails?: A Little Help needed moving to and from a bed to a chair (including a wheelchair)?: A Little Help needed standing up from a chair using your arms (e.g., wheelchair or bedside chair)?: A Lot Help needed to walk in hospital room?: A Lot Help needed climbing 3-5 steps with a railing? : Total 6 Click Score: 14    End of Session Equipment Utilized During Treatment: Gait belt Activity Tolerance: Patient limited by pain (despitre premedication) Patient left: with call bell/phone within reach;in bed;with bed alarm set (sitting at EOB with lunch) Nurse Communication: Mobility status;Other (comment) (sitting at EOB with lunch (refused OOB to chair)) PT Visit Diagnosis: History of falling (Z91.81);Muscle weakness (generalized) (M62.81);Other abnormalities of gait and mobility (R26.89)     Time: 0102-7253 PT Time Calculation (min) (ACUTE ONLY): 24 min  Charges:  $Therapeutic Activity: 23-37 mins                      Arby Barrette, PT Pager [phone]    Rexanne Mano 12/13/2020, 12:27 PM

## 2020-12-13 NOTE — TOC Progression Note (Addendum)
Transition of Care [facility]) - Progression Note    Patient Details  Name: Bradley Stokes MRN: [id number] Date of Birth: 1945/08/04  Transition of Care [facility]) CM/SW [place], LCSW Phone Number: 12/13/2020, 12:03 PM  Clinical Narrative:    [facility] has received insurance authorization for patient. Of note, they have also received approval for his sister to discharge there today as well. CSW and MSW intern spoke with patient and made him aware. He called his sister and they were able to speak about the plan of transportation. He expressed concern that he has to pay for her health insurance bill that is due soon. CSW told him to ask his friend Marden Noble [phone]) to bring his bills and checkbook to him as he does not have online banking. CSW had discussion with patient about the future and that rehab is only temporary for him and his sister. He stated that they do not qualify for Medicaid due to inheriting money from their deceased sister and an ROI account he has. CSW placed meals on wheels info on AVS with transportation info.  CSW discussed case with APS, Adrienne. She stated that unfortunately they cannot do anything at this time to assist because they are going to a safe place.   CSW spoke with patient's friend, Marden Noble. He is aware of patient and his sister going to [employer] today and will meet with them once they arrive to see how he can help.    Expected Discharge Plan:  (outpatient palliative) Barriers to Discharge: Insurance Authorization, Continued Medical Work up  Expected Discharge Plan and Services Expected Discharge Plan:  (outpatient palliative) In-house Referral: Clinical Social Work     Living arrangements for the past 2 months: Single Family Home Expected Discharge Date: 12/13/20                                     Social Determinants of Health (SDOH) Interventions    Readmission Risk Interventions No flowsheet data found.

## 2020-12-13 NOTE — Progress Notes (Signed)
Report attempted x1 to [employer], no answer

## 2020-12-13 NOTE — Progress Notes (Signed)
Care relinquished to EMS transport.

## 2020-12-13 NOTE — Progress Notes (Signed)
[facility] Progress Note  5 Days Post-Op  Subjective: CC-  Sitting up in bed eating breakfast. States that his abdomen is sore but denies significant pain. Tolerating soft diet. Denies n/v. Passing flatus, no BM.   Objective: Vital signs in last 24 hours: Temp:  [97.7 F (36.5 C)-98.5 F (36.9 C)] 98.2 F (36.8 C) (09/15 0811) Pulse Rate:  [73-94] 94 (09/15 0811) Resp:  [13-24] 24 (09/15 0811) BP: (124-146)/(53-69) 139/69 (09/15 0811) SpO2:  [91 %-100 %] 96 % (09/15 0811) Weight:  [132.3 kg] 132.3 kg (09/15 0336) Last BM Date: 12/12/20  Intake/Output from previous day: 09/14 0701 - 09/15 0700 In: -  Out: 800 [Urine:800] Intake/Output this shift: Total I/O In: -  Out: 650 [Urine:650]  PE: Gen:  Alert, NAD, pleasant Pulm: Normal rate and effort  Abd: Soft, mild distension, nontender, incisions cdi without erythema or drainage and staples present Psych: A&Ox3  Skin: no rashes noted, warm and dry  Lab Results:  Recent Labs    12/11/20 0035 12/12/20 0037  WBC 11.4* 8.8  HGB 9.7* 9.6*  HCT 31.1* 31.0*  PLT 367 339   BMET Recent Labs    12/11/20 0035 12/12/20 0037  NA 140 142  K 4.3 4.2  CL 108 111  CO2 25 27  GLUCOSE 102* 111*  BUN 20 21  CREATININE 1.84* 1.76*  CALCIUM 8.6* 8.8*   PT/INR No results for input(s): LABPROT, INR in the last 72 hours. CMP     Component Value Date/Time   NA 142 12/12/2020 0037   NA 142 06/12/2020 1126   K 4.2 12/12/2020 0037   CL 111 12/12/2020 0037   CO2 27 12/12/2020 0037   GLUCOSE 111 (H) 12/12/2020 0037   BUN 21 12/12/2020 0037   BUN 18 06/12/2020 1126   CREATININE 1.76 (H) 12/12/2020 0037   CREATININE 1.79 (H) 01/01/2015 1234   CALCIUM 8.8 (L) 12/12/2020 0037   PROT 5.7 (L) 12/07/2020 0255   PROT 6.5 06/12/2020 1126   ALBUMIN 2.7 (L) 12/07/2020 0255   ALBUMIN 3.9 06/12/2020 1126   AST 17 12/07/2020 0255   ALT 9 12/07/2020 0255   ALKPHOS 101 12/07/2020 0255   BILITOT 1.2 12/07/2020 0255    BILITOT 0.6 06/12/2020 1126   GFRNONAA 39 (L) 12/12/2020 0037   GFRAA 38 (L) 12/21/2019 1155   Lipase  No results found for: LIPASE     Studies/Results: No results found.  Anti-infectives: Anti-infectives (From admission, onward)    Start     Dose/Rate Route Frequency Ordered Stop   12/07/20 1445  cefoTEtan (CEFOTAN) 2 g in sodium chloride 0.9 % 100 mL IVPB        2 g 200 mL/hr over 30 Minutes Intravenous On call to O.R. 12/07/20 1355 12/08/20 0559   12/07/20 0600  cefoTEtan (CEFOTAN) 2 g in sodium chloride 0.9 % 100 mL IVPB  Status:  Discontinued        2 g 200 mL/hr over 30 Minutes Intravenous On call to O.R. 12/06/20 1436 12/07/20 2042   12/06/20 2200  neomycin (MYCIFRADIN) tablet 1,000 mg       See Hyperspace for full Linked Orders Report.   1,000 mg Oral 3 times per day 12/06/20 1436 12/07/20 1614   12/06/20 2200  metroNIDAZOLE (FLAGYL) tablet 1,000 mg       See Hyperspace for full Linked Orders Report.   1,000 mg Oral 3 times per day 12/06/20 1436 12/07/20 1444  Assessment/Plan POD 5 s/p lap extended right colectomy with ileocolonic anastomosis creation on Proximal descending colon mass by Dr. Kieth Brightly and Dr. Radene Knee 9/10   Final path moderately differentiated adenocarcinoma pT3N1a   - CEA wnl at 3.4 - CT chest/abd/pelvis with IV contrast 9/8 with single 1.3 cm lymph node posterior to the colonic lesion likely reflecting local nodal involvement. This also showed suspect benign hepatic cysts but cystic hepatic metastases could not be excluded - MRI liver appears that areas are liver cysts.  - Oncology following. Possible Xeloda as outpatient.  No port needed.   - Adv to [facility] diet, add colace BID and miralax PRN - Mobilize - PT rec SNF - Pulm toilet   FEN: HH diet ID: cefotetan periop VTE: eliquis  Foley - out, voiding    ABL anemia  Atrial fibrillation HTN HLD   LOS: 10 days    Wellington Hampshire, [facility] 12/13/2020, 8:37  AM Please see Amion for pager number during day hours 7:00am-4:30pm

## 2020-12-13 NOTE — Progress Notes (Signed)
Report called to [place] at [employer].

## 2020-12-13 NOTE — TOC Transition Note (Signed)
Transition of Care [facility]) - CM/SW Discharge Note   Patient Details  Name: ETIENNE MOWERS MRN: [id number] Date of Birth: 1945/05/30  Transition of Care [facility]) CM/SW Contact:  Ross Bender Aileen Fass, [place] Work Phone Number: 12/13/2020, 12:12 PM   Clinical Narrative:    Patient will DC to: [facility] date: 12/13/2020 Family notified: Sister- Seth Bake Transport by: Corey Harold   Per MD patient ready for DC to [facility] . RN to call report prior to discharge [phone]) Room 107B. RN, patient, patient's family, and facility notified of DC. Discharge Summary and FL2 sent to facility. DC packet on chart. Ambulance transport requested for patient.   CSW will sign off for now as social work intervention is no longer needed. Please consult [place] again if new needs arise.     Final next level of care: Skilled Nursing Facility Barriers to Discharge: Barriers Resolved   Patient Goals and CMS Choice Patient states their goals for this hospitalization and ongoing recovery are:: Take care of sister CMS Medicare.gov Compare Post Acute Care list provided to:: Patient Choice offered to / list presented to : Patient  Discharge Placement   Existing PASRR number confirmed : 12/13/20          Patient chooses bed at: [facility] Patient to be transferred to facility by: [facility] Name of family member notified: Sister Patient and family notified of of transfer: 12/13/20  Discharge Plan and Services In-house Referral: Clinical Social Work                                   Social Determinants of Health ([place]) Interventions     Readmission Risk Interventions No flowsheet data found.

## 2020-12-14 ENCOUNTER — Telehealth: Payer: Self-pay

## 2020-12-14 LAB — SURGICAL PATHOLOGY

## 2020-12-14 NOTE — Telephone Encounter (Signed)
Attempted to contact patient to schedule a Palliative Care consult appointment. No answer or voicemail.   

## 2020-12-17 ENCOUNTER — Ambulatory Visit: Payer: Medicare HMO | Admitting: Cardiology

## 2020-12-17 DIAGNOSIS — L24A2 Irritant contact dermatitis due to fecal, urinary or dual incontinence: Secondary | ICD-10-CM | POA: Diagnosis not present

## 2020-12-17 DIAGNOSIS — C189 Malignant neoplasm of colon, unspecified: Secondary | ICD-10-CM | POA: Diagnosis not present

## 2020-12-17 DIAGNOSIS — I739 Peripheral vascular disease, unspecified: Secondary | ICD-10-CM | POA: Diagnosis not present

## 2020-12-17 DIAGNOSIS — L98411 Non-pressure chronic ulcer of buttock limited to breakdown of skin: Secondary | ICD-10-CM | POA: Diagnosis not present

## 2020-12-17 DIAGNOSIS — R600 Localized edema: Secondary | ICD-10-CM | POA: Diagnosis not present

## 2020-12-17 DIAGNOSIS — M25572 Pain in left ankle and joints of left foot: Secondary | ICD-10-CM | POA: Diagnosis not present

## 2020-12-17 DIAGNOSIS — R630 Anorexia: Secondary | ICD-10-CM | POA: Diagnosis not present

## 2020-12-18 DIAGNOSIS — M25572 Pain in left ankle and joints of left foot: Secondary | ICD-10-CM | POA: Diagnosis not present

## 2020-12-18 DIAGNOSIS — M6281 Muscle weakness (generalized): Secondary | ICD-10-CM | POA: Diagnosis not present

## 2020-12-18 DIAGNOSIS — R262 Difficulty in walking, not elsewhere classified: Secondary | ICD-10-CM | POA: Diagnosis not present

## 2020-12-18 DIAGNOSIS — I5032 Chronic diastolic (congestive) heart failure: Secondary | ICD-10-CM | POA: Diagnosis not present

## 2020-12-18 DIAGNOSIS — R600 Localized edema: Secondary | ICD-10-CM | POA: Diagnosis not present

## 2020-12-18 DIAGNOSIS — R0789 Other chest pain: Secondary | ICD-10-CM | POA: Diagnosis not present

## 2020-12-18 DIAGNOSIS — R5381 Other malaise: Secondary | ICD-10-CM | POA: Diagnosis not present

## 2020-12-19 ENCOUNTER — Other Ambulatory Visit: Payer: Self-pay

## 2020-12-19 ENCOUNTER — Non-Acute Institutional Stay: Payer: Self-pay | Admitting: Student

## 2020-12-19 DIAGNOSIS — R609 Edema, unspecified: Secondary | ICD-10-CM

## 2020-12-19 DIAGNOSIS — Z515 Encounter for palliative care: Secondary | ICD-10-CM | POA: Diagnosis not present

## 2020-12-19 DIAGNOSIS — R0600 Dyspnea, unspecified: Secondary | ICD-10-CM | POA: Diagnosis not present

## 2020-12-19 DIAGNOSIS — R52 Pain, unspecified: Secondary | ICD-10-CM

## 2020-12-19 DIAGNOSIS — R531 Weakness: Secondary | ICD-10-CM

## 2020-12-19 DIAGNOSIS — C189 Malignant neoplasm of colon, unspecified: Secondary | ICD-10-CM

## 2020-12-19 NOTE — Progress Notes (Signed)
[facility] Consult Note Telephone: [phone]  Fax: [phone]   Date of encounter: 12/19/20 4:35 PM PATIENT NAME: Bradley Stokes [address] [place] [address]   [phone] (home)  DOB: 04-10-1945 MRN: [id number] PRIMARY CARE PROVIDER:    [name], MD,  [place] [address] [phone]  REFERRING PROVIDER:   Martinique Miller, NP  RESPONSIBLE PARTY:    Contact Information     Name Relation Home Work Mobile   Blairs Sister [phone]     Otho Perl   [phone]        I met face to face with patient in the facility. Palliative Care was asked to follow this patient by consultation request of  Martinique Miller, NP to address advance care planning and complex medical decision making. This is the initial visit.                                     ASSESSMENT AND PLAN / RECOMMENDATIONS:   Advance Care Planning/Goals of Care: Goals include to maximize quality of life and symptom management. Patient/health care surrogate gave his/her permission to discuss.Our advance care planning conversation included a discussion about:    The value and importance of advance care planning  Experiences with loved ones who have been seriously ill or have died  Exploration of personal, cultural or spiritual beliefs that might influence medical decisions  Exploration of goals of care in the event of a sudden injury or illness  Code status reviewed; continue DNR.  CODE STATUS: DNR  Education provided on Palliative Medicine. Patient wishes to continue therapy, in hopes to return home.   Symptom Management/Plan:  Adenocarcinoma of colon-Stage IIIb. S/p right colectomy. Patient to follow up with oncology as scheduled for further treatment.   Pain-c/o left ankle pain, hx of OA. Continue prednisone taper as directed, acetaminophen 632m TID x 7 days routinely, acetaminophen PRN, tramadol 5355m every 6 hours PRN moderate to severe pain.   Generalized weakness-secondary to HF, right sided colectomy. Continue PT, OT as directed.   Dyspnea-secondary to HF. Albuterol neb 55m56mvery 6 hours PRN shortness of breath or wheezing.   LE Edema-secondary to HF. Continue furosemide 31m7mD. Compression stockings on each AM, off each night. Elevate legs while in bed.   Follow up Palliative Care Visit: Palliative care will continue to follow for complex medical decision making, advance care planning, and clarification of goals. Return in 4-6 weeks or prn.  I spent 45 minutes providing this consultation. More than 50% of the time in this consultation was spent in counseling and care coordination.   PPS: 40%  HOSPICE ELIGIBILITY/DIAGNOSIS: TBD  Chief Complaint: Palliative medicine initial consult.   HISTORY OF PRESENT ILLNESS:  StepCHASTEN BLAZEa 75 y39. year old male  with chronic diastolic heart failure, hypertension, hyperlipidemia, paroxysmal atrial fibrillation, OA, obesity, AKI on CKD 3b. Patient hospitalized 9/4-9/15/22 due to mass in proximal descending colon; biopsy confirmed adenocarcinoma of colon-stage IIIb. Patient underwent right sided colectomy.   [facility] is currently at [facility] is receiving OT/PT services. He states his pain has improved to left foot/ankle since yesterday. Denies pain at present. Endorses occasional shortness of breath with exertion. Good appetite, No melena or hematochezia. Denies nausea or constipation. He is sleeping okay at night. No recent falls. Patient states he and his sister live together.  He is going to need a ramp built when he discharges. A 10-point review of systems is negative, except for the pertinent positives and negatives detailed in the HPI.    History obtained from review of EMR, discussion with primary team, and interview with family, facility staff/caregiver and/or Mr. Googe.  I reviewed available labs, medications, imaging,  studies and related documents from the EMR.  Records reviewed and summarized above.    Physical Exam: Pulse 84, resp 20, sats 95-97% on room air Constitutional: NAD General: frail appearing, obese  EYES: anicteric sclera, lids intact, no discharge  ENMT: intact hearing, oral mucous membranes moist, dentition intact CV: S1S2, RRR, 2+ pedal edema Pulmonary: LCTA, no increased work of breathing, no cough, room air Abdomen: normo-active BS + 4 quadrants, soft and non tender GU: deferred MSK: moves all extremities, ambulatory Skin: warm and dry, no rashes or wounds on visible skin Neuro: generalized weakness, A & O x 3 Psych: non-anxious affect, pleasant Hem/lymph/immuno: no widespread bruising CURRENT PROBLEM LIST:  Patient Active Problem List   Diagnosis Date Noted   Anemia    Cancer of left colon (HCC)    GI bleed 12/02/2020   (HFpEF) heart failure with preserved ejection fraction ([place]) 12/02/2020   Acute blood loss anemia 12/02/2020   Heart murmur previously undiagnosed 01/01/2015   Long term use of drug 09/05/2011   PAF (paroxysmal atrial fibrillation) (HCC)    Edema    Long term current use of anticoagulant therapy    Hyperlipidemia    Hypertension    PAST MEDICAL HISTORY:  Active Ambulatory Problems    Diagnosis Date Noted   PAF (paroxysmal atrial fibrillation) (HCC)    Edema    Long term current use of anticoagulant therapy    Hyperlipidemia    Hypertension    Long term use of drug 09/05/2011   Heart murmur previously undiagnosed 01/01/2015   GI bleed 12/02/2020   (HFpEF) heart failure with preserved ejection fraction ([place]) 12/02/2020   Acute blood loss anemia 12/02/2020   Anemia    Cancer of left colon ([place])    Resolved Ambulatory Problems    Diagnosis Date Noted   No Resolved Ambulatory Problems   Past Medical History:  Diagnosis Date   Arthritis    Chest pain    Chronic atrial fibrillation (HCC)    Chronic renal disease, stage III (HCC)    Chronic  venous stasis dermatitis    Gout    History of cardiovascular stress test 01/17/2008   Hypercholesterolemia    Kidney stones    Long term (current) use of anticoagulants    Long-term (current) use of anticoagulants    Obesity    Osteoarthritis    Paroxysmal atrial fibrillation (HCC)    Secondary hypercoagulable state ([place])    SOCIAL HX:  Social History   Tobacco Use   Smoking status: Never   Smokeless tobacco: Never  Substance Use Topics   Alcohol use: No   FAMILY HX:  Family History  Problem Relation Age of Onset   Pneumonia Mother    Heart failure Mother    Kidney failure Sister    Heart disease Brother    Crohn's disease Sister       ALLERGIES:  Allergies  Allergen Reactions   Other     Tape,needs paper tape     PERTINENT MEDICATIONS:  Outpatient Encounter Medications as of 12/19/2020  Medication Sig   acetaminophen (TYLENOL) 325 MG tablet Take 325 mg by mouth every 6 (  six) hours as needed (pain).    albuterol (PROVENTIL) (2.5 MG/3ML) 0.083% nebulizer solution Take 3 mLs (2.5 mg total) by nebulization every 6 (six) hours as needed for wheezing or shortness of breath.   allopurinol (ZYLOPRIM) 100 MG tablet Take 100 mg by mouth daily.   amiodarone (PACERONE) 200 MG tablet 1/2 tablet daily (Patient taking differently: Take 100 mg by mouth daily. 1/2 tablet daily)   ammonium lactate (LAC-HYDRIN) 12 % lotion Apply 1 application topically 2 (two) times daily.   apixaban (ELIQUIS) 5 MG TABS tablet Take 1 tablet (5 mg total) by mouth 2 (two) times daily.   atorvastatin (LIPITOR) 20 MG tablet Take 20 mg by mouth Daily.   calcitRIOL (ROCALTROL) 0.5 MCG capsule Take 0.5 mcg daily by mouth.    furosemide (LASIX) 20 MG tablet TAKE 2 TABLETS BY MOUTH TWICE DAILY (Patient taking differently: Take 40 mg by mouth 2 (two) times daily.)   K Phos Mono-Sod Phos Di & Mono (VIRT-PHOS 250 NEUTRAL) [phone] MG TABS Take 1 tablet by mouth 2 (two) times a day.    Nutritional Supplements  (,FEEDING SUPPLEMENT, PROSOURCE PLUS) liquid Take 30 mLs by mouth 3 (three) times daily between meals.   pantoprazole (PROTONIX) 40 MG tablet Take 1 tablet (40 mg total) by mouth daily.   potassium chloride SA (KLOR-CON) 20 MEQ tablet TAKE 1 TABLET BY MOUTH DAILY (Patient taking differently: Take 20 mEq by mouth daily. TAKE 1 TABLET BY MOUTH DAILY)   No facility-administered encounter medications on file as of 12/19/2020.   Thank you for the opportunity to participate in the care of Mr. Krzyzanowski.  The palliative care team will continue to follow. Please call our office at [phone] if we can be of additional assistance.   Ezekiel Slocumb, NP   COVID-19 PATIENT SCREENING TOOL Asked and negative response unless otherwise noted:  Have you had symptoms of covid, tested positive or been in contact with someone with symptoms/positive test in the past 5-10 days? No

## 2020-12-20 DIAGNOSIS — I5032 Chronic diastolic (congestive) heart failure: Secondary | ICD-10-CM | POA: Diagnosis not present

## 2020-12-20 DIAGNOSIS — C189 Malignant neoplasm of colon, unspecified: Secondary | ICD-10-CM | POA: Diagnosis not present

## 2020-12-20 DIAGNOSIS — I480 Paroxysmal atrial fibrillation: Secondary | ICD-10-CM | POA: Diagnosis not present

## 2020-12-20 DIAGNOSIS — N179 Acute kidney failure, unspecified: Secondary | ICD-10-CM | POA: Diagnosis not present

## 2020-12-24 ENCOUNTER — Other Ambulatory Visit: Payer: Self-pay

## 2020-12-24 DIAGNOSIS — L98411 Non-pressure chronic ulcer of buttock limited to breakdown of skin: Secondary | ICD-10-CM | POA: Diagnosis not present

## 2020-12-24 DIAGNOSIS — N189 Chronic kidney disease, unspecified: Secondary | ICD-10-CM

## 2020-12-24 DIAGNOSIS — Z7901 Long term (current) use of anticoagulants: Secondary | ICD-10-CM

## 2020-12-24 DIAGNOSIS — L24A2 Irritant contact dermatitis due to fecal, urinary or dual incontinence: Secondary | ICD-10-CM | POA: Diagnosis not present

## 2020-12-24 DIAGNOSIS — I739 Peripheral vascular disease, unspecified: Secondary | ICD-10-CM | POA: Diagnosis not present

## 2020-12-24 DIAGNOSIS — Z79899 Other long term (current) drug therapy: Secondary | ICD-10-CM

## 2020-12-24 DIAGNOSIS — I480 Paroxysmal atrial fibrillation: Secondary | ICD-10-CM

## 2020-12-24 DIAGNOSIS — I119 Hypertensive heart disease without heart failure: Secondary | ICD-10-CM

## 2020-12-24 MED ORDER — AMIODARONE HCL 200 MG PO TABS: ORAL_TABLET | ORAL | 1 refills | Status: DC

## 2020-12-25 ENCOUNTER — Encounter (HOSPITAL_COMMUNITY): Payer: Self-pay | Admitting: Gastroenterology

## 2020-12-25 DIAGNOSIS — R5381 Other malaise: Secondary | ICD-10-CM | POA: Diagnosis not present

## 2020-12-25 DIAGNOSIS — R262 Difficulty in walking, not elsewhere classified: Secondary | ICD-10-CM | POA: Diagnosis not present

## 2020-12-25 DIAGNOSIS — M6281 Muscle weakness (generalized): Secondary | ICD-10-CM | POA: Diagnosis not present

## 2020-12-25 DIAGNOSIS — M25572 Pain in left ankle and joints of left foot: Secondary | ICD-10-CM | POA: Diagnosis not present

## 2020-12-25 NOTE — Addendum Note (Signed)
Addendum  created 12/25/20 2059 by Oleta Mouse, MD   Intraprocedure Event edited, Intraprocedure Meds edited, Intraprocedure Staff edited

## 2020-12-31 DIAGNOSIS — D649 Anemia, unspecified: Secondary | ICD-10-CM | POA: Diagnosis not present

## 2020-12-31 DIAGNOSIS — N179 Acute kidney failure, unspecified: Secondary | ICD-10-CM | POA: Diagnosis not present

## 2020-12-31 DIAGNOSIS — C189 Malignant neoplasm of colon, unspecified: Secondary | ICD-10-CM | POA: Diagnosis not present

## 2020-12-31 DIAGNOSIS — R5381 Other malaise: Secondary | ICD-10-CM | POA: Diagnosis not present

## 2020-12-31 DIAGNOSIS — M6281 Muscle weakness (generalized): Secondary | ICD-10-CM | POA: Diagnosis not present

## 2020-12-31 DIAGNOSIS — N1832 Chronic kidney disease, stage 3b: Secondary | ICD-10-CM | POA: Diagnosis not present

## 2021-01-04 ENCOUNTER — Inpatient Hospital Stay: Payer: Medicare HMO

## 2021-01-04 ENCOUNTER — Inpatient Hospital Stay: Payer: Medicare HMO | Attending: Hematology

## 2021-01-04 ENCOUNTER — Ambulatory Visit: Payer: Medicare HMO | Admitting: Hematology

## 2021-01-04 ENCOUNTER — Inpatient Hospital Stay: Payer: Medicare HMO | Admitting: Hematology

## 2021-01-04 DIAGNOSIS — Z9049 Acquired absence of other specified parts of digestive tract: Secondary | ICD-10-CM | POA: Insufficient documentation

## 2021-01-04 DIAGNOSIS — Z79899 Other long term (current) drug therapy: Secondary | ICD-10-CM | POA: Insufficient documentation

## 2021-01-04 DIAGNOSIS — D631 Anemia in chronic kidney disease: Secondary | ICD-10-CM | POA: Insufficient documentation

## 2021-01-04 DIAGNOSIS — C186 Malignant neoplasm of descending colon: Secondary | ICD-10-CM | POA: Insufficient documentation

## 2021-01-04 DIAGNOSIS — D630 Anemia in neoplastic disease: Secondary | ICD-10-CM | POA: Insufficient documentation

## 2021-01-04 DIAGNOSIS — I129 Hypertensive chronic kidney disease with stage 1 through stage 4 chronic kidney disease, or unspecified chronic kidney disease: Secondary | ICD-10-CM | POA: Insufficient documentation

## 2021-01-04 DIAGNOSIS — I480 Paroxysmal atrial fibrillation: Secondary | ICD-10-CM | POA: Insufficient documentation

## 2021-01-04 DIAGNOSIS — N184 Chronic kidney disease, stage 4 (severe): Secondary | ICD-10-CM | POA: Insufficient documentation

## 2021-01-04 DIAGNOSIS — M6281 Muscle weakness (generalized): Secondary | ICD-10-CM | POA: Insufficient documentation

## 2021-01-07 DIAGNOSIS — I739 Peripheral vascular disease, unspecified: Secondary | ICD-10-CM | POA: Diagnosis not present

## 2021-01-07 DIAGNOSIS — I872 Venous insufficiency (chronic) (peripheral): Secondary | ICD-10-CM | POA: Diagnosis not present

## 2021-01-07 DIAGNOSIS — R600 Localized edema: Secondary | ICD-10-CM | POA: Diagnosis not present

## 2021-01-07 NOTE — Progress Notes (Signed)
Bradley Stokes missed his appt on 10/7.  He resides at [facility] and they were unaware of the appt.  I have rescheduled Bradley Stokes and have left a vm detailing his upcoming appts with Dewaine Oats at [employer] who arranges transportation.  I requested she return my call to confirm receipt of my message.

## 2021-01-08 DIAGNOSIS — R1084 Generalized abdominal pain: Secondary | ICD-10-CM | POA: Diagnosis not present

## 2021-01-08 DIAGNOSIS — K219 Gastro-esophageal reflux disease without esophagitis: Secondary | ICD-10-CM | POA: Diagnosis not present

## 2021-01-08 DIAGNOSIS — C189 Malignant neoplasm of colon, unspecified: Secondary | ICD-10-CM | POA: Diagnosis not present

## 2021-01-08 DIAGNOSIS — I5032 Chronic diastolic (congestive) heart failure: Secondary | ICD-10-CM | POA: Diagnosis not present

## 2021-01-08 NOTE — Progress Notes (Signed)
I spoke with Joseph Art at [facility]. I reviewed Mr Goodbar upcoming appt with Dr Burr Medico 01/16/2021 Lab at 1230.  I ask that Mr Arlotta arrive 15 minutes early to register.  She will forward this information to their transportation coordinator and Mr Vasco.

## 2021-01-16 ENCOUNTER — Encounter: Payer: Self-pay | Admitting: Hematology

## 2021-01-16 ENCOUNTER — Other Ambulatory Visit: Payer: Self-pay

## 2021-01-16 ENCOUNTER — Inpatient Hospital Stay (HOSPITAL_BASED_OUTPATIENT_CLINIC_OR_DEPARTMENT_OTHER): Payer: Medicare HMO | Admitting: Hematology

## 2021-01-16 ENCOUNTER — Inpatient Hospital Stay: Payer: Medicare HMO

## 2021-01-16 VITALS — BP 126/61 | HR 79 | Temp 97.7°F | Resp 19 | Ht 69.0 in

## 2021-01-16 DIAGNOSIS — C186 Malignant neoplasm of descending colon: Secondary | ICD-10-CM

## 2021-01-16 DIAGNOSIS — I5032 Chronic diastolic (congestive) heart failure: Secondary | ICD-10-CM | POA: Diagnosis not present

## 2021-01-16 DIAGNOSIS — M6281 Muscle weakness (generalized): Secondary | ICD-10-CM | POA: Diagnosis not present

## 2021-01-16 DIAGNOSIS — I129 Hypertensive chronic kidney disease with stage 1 through stage 4 chronic kidney disease, or unspecified chronic kidney disease: Secondary | ICD-10-CM | POA: Diagnosis not present

## 2021-01-16 DIAGNOSIS — N1832 Chronic kidney disease, stage 3b: Secondary | ICD-10-CM | POA: Diagnosis not present

## 2021-01-16 DIAGNOSIS — N179 Acute kidney failure, unspecified: Secondary | ICD-10-CM | POA: Diagnosis not present

## 2021-01-16 DIAGNOSIS — Z9049 Acquired absence of other specified parts of digestive tract: Secondary | ICD-10-CM | POA: Diagnosis not present

## 2021-01-16 DIAGNOSIS — D631 Anemia in chronic kidney disease: Secondary | ICD-10-CM | POA: Diagnosis not present

## 2021-01-16 DIAGNOSIS — N184 Chronic kidney disease, stage 4 (severe): Secondary | ICD-10-CM | POA: Diagnosis not present

## 2021-01-16 DIAGNOSIS — D630 Anemia in neoplastic disease: Secondary | ICD-10-CM | POA: Diagnosis not present

## 2021-01-16 DIAGNOSIS — D649 Anemia, unspecified: Secondary | ICD-10-CM | POA: Diagnosis not present

## 2021-01-16 DIAGNOSIS — I480 Paroxysmal atrial fibrillation: Secondary | ICD-10-CM | POA: Diagnosis not present

## 2021-01-16 DIAGNOSIS — Z79899 Other long term (current) drug therapy: Secondary | ICD-10-CM | POA: Diagnosis not present

## 2021-01-16 LAB — CBC WITH DIFFERENTIAL (CANCER CENTER ONLY)
Abs Immature Granulocytes: 0.38 10*3/uL — ABNORMAL HIGH (ref 0.00–0.07)
Basophils Absolute: 0.1 10*3/uL (ref 0.0–0.1)
Basophils Relative: 1 %
Eosinophils Absolute: 0.5 10*3/uL (ref 0.0–0.5)
Eosinophils Relative: 5 %
HCT: 31.7 % — ABNORMAL LOW (ref 39.0–52.0)
Hemoglobin: 9.4 g/dL — ABNORMAL LOW (ref 13.0–17.0)
Immature Granulocytes: 4 %
Lymphocytes Relative: 12 %
Lymphs Abs: 1.2 10*3/uL (ref 0.7–4.0)
MCH: 28.6 pg (ref 26.0–34.0)
MCHC: 29.7 g/dL — ABNORMAL LOW (ref 30.0–36.0)
MCV: 96.4 fL (ref 80.0–100.0)
Monocytes Absolute: 0.5 10*3/uL (ref 0.1–1.0)
Monocytes Relative: 5 %
Neutro Abs: 7.2 10*3/uL (ref 1.7–7.7)
Neutrophils Relative %: 73 %
Platelet Count: 737 10*3/uL — ABNORMAL HIGH (ref 150–400)
RBC: 3.29 MIL/uL — ABNORMAL LOW (ref 4.22–5.81)
RDW: 15.2 % (ref 11.5–15.5)
WBC Count: 9.8 10*3/uL (ref 4.0–10.5)
nRBC: 0 % (ref 0.0–0.2)

## 2021-01-16 LAB — CMP (CANCER CENTER ONLY)
ALT: 5 U/L (ref 0–44)
AST: 14 U/L — ABNORMAL LOW (ref 15–41)
Albumin: 2.2 g/dL — ABNORMAL LOW (ref 3.5–5.0)
Alkaline Phosphatase: 88 U/L (ref 38–126)
Anion gap: 15 (ref 5–15)
BUN: 32 mg/dL — ABNORMAL HIGH (ref 8–23)
CO2: 29 mmol/L (ref 22–32)
Calcium: 9.9 mg/dL (ref 8.9–10.3)
Chloride: 100 mmol/L (ref 98–111)
Creatinine: 2.71 mg/dL — ABNORMAL HIGH (ref 0.61–1.24)
GFR, Estimated: 23 mL/min — ABNORMAL LOW (ref >60)
Glucose, Bld: 103 mg/dL — ABNORMAL HIGH (ref 70–99)
Potassium: 4 mmol/L (ref 3.5–5.1)
Sodium: 144 mmol/L (ref 135–145)
Total Bilirubin: 0.7 mg/dL (ref 0.3–1.2)
Total Protein: 7.6 g/dL (ref 6.5–8.1)

## 2021-01-16 LAB — IRON AND TIBC
Iron: 25 ug/dL — ABNORMAL LOW (ref 42–163)
Saturation Ratios: 10 % — ABNORMAL LOW (ref 20–55)
TIBC: 243 ug/dL (ref 202–409)
UIBC: 218 ug/dL (ref 117–376)

## 2021-01-16 NOTE — Progress Notes (Signed)
[place]   [phone] [phone]   Clinic Follow up Note   Patient Care Team: [name], [name], MD as PCP - General (Family Medicine) Jerline Pain, MD as PCP - Cardiology (Cardiology)  Date of Service:  01/16/2021  CHIEF COMPLAINT: f/u of colon cancer  CURRENT THERAPY:  Surveillance  ASSESSMENT & PLAN:  Bradley Stokes is a 75 y.o. male with   1. Cancer of left colon, pT3N1M0 stage IIIB -initially presented with hematochezia/rectal bleeding. Colonoscopy on 12/05/20 showed ulcerated mass in proximal descending colon, pathology confirmed invasive moderately differentiated adenocarcinoma. -baseline CEA on 12/05/20 was WNL at 3.4 -CT CAP on 12/06/20 showed colonic wall thickening, a single 1.3 cm lymph node posterior to primary lesion, and two subcentimeter hepatic lesions. -he had right colectomy on 12/08/20 with Dr. Kieth Brightly. Pathology showed 5 cm tumor, margins not involved, one of 23 lymph nodes involved, and five tubular adenomas -liver MRI on 12/10/20 showed two probable liver cysts, not considered suspicious for metastatic disease. -we discussed he is high risk for recurrence due to stage III disease.  The standard recommendation is adjuvant chemotherapy.  I discussed the option of FOLFOX, Hidalgo ox or single agent Xeloda.  Unfortunately due to his poor performance status, and stage IV kidney disease, he is not a candidate for adjuvant chemotherapy, even with single agent Xeloda. -- I discussed the surveillance plan, which is a physical exam and lab test (including CBC, CMP and CEA) every 3 months for the first 2 years, then every 6-12 months, colonoscopy in one year, and surveilliance CT scan every 6-12 month for up to 5 year.   2. HTN, AF, CKD stage IV -F/u with PCP  3.anemia  -Secondary to recent surgery, colon cancer, and chronic kidney disease -continue oral iron   2. Social support -he previously lived with his sister. They are now both  in the same skilled rehab facility for different reasons.   PLAN: -he is not a candidate for adjuvant chemotherapy  -I recommend cancer surveillance.   -Lab and f/u in 3 months   No problem-specific Assessment & Plan notes found for this encounter.   SUMMARY OF ONCOLOGIC HISTORY: Oncology History Overview Note  Cancer Staging Cancer of left colon [facility]) Staging form: Colon and Rectum, AJCC 8th Edition - Pathologic stage from 12/08/2020: Stage IIIB (pT3, pN1a, cM0) - Signed by Truitt Merle, MD on 12/11/2020    Cancer of left colon ([place])  12/05/2020 Procedure   Colonoscopy, under Dr. Collene Mares  Impression: - Preparation of the colon was extremely poor inspite of aggressive lavage. - Diverticulosis in the sigmoid colon. - Likely malignant 5 cm tumor in the proximal descending colon-biopsied; mucosa 5 cm distal to the mass was injected with 3 cc's of [place] ink. - One 10 mm polyp in the cecum, removed with a hot snare x 1; resected and retrieved. - Moderate sized internal hemorrhoids.   12/05/2020 Pathology Results   FINAL MICROSCOPIC DIAGNOSIS:   A. COLON, CECAL, POLYPECTOMY:  - Tubular adenoma  - Negative for high-grade dysplasia or malignancy   B. COLON MASS, DESCENDING, BIOPSY:  - Invasive moderately differentiated adenocarcinoma, see comment    ADDENDUM: Mismatch Repair Protein (IHC)  SUMMARY INTERPRETATION: NORMAL    12/06/2020 Imaging   CT CAP  IMPRESSION: 1. Short segment of colonic wall thickening involving the splenic flexure likely reflects patient's presumed colonic malignancy. 2. Single 1.3 cm lymph node posterior to the colonic lesion likely reflecting local nodal involvement. No  additional pathologically enlarged abdominal or pelvic lymph nodes to suggest distant nodal metastases. 3. Two subcentimeter hypodense hepatic lesions, incompletely characterized on this examination. Possibly reflecting benign hepatic cysts but cystic hepatic metastases not excluded on  this study. Consider attention on close interval follow-up imaging versus further evaluation with hepatic protocol MRI with and without contrast, preferably as an outpatient when patient is stable and able to follow commands. 4. Healing nonpathologic left lateral eighth and ninth rib fractures. 5. Aortic Atherosclerosis (ICD10-I70.0).   12/08/2020 Cancer Staging   Staging form: Colon and Rectum, AJCC 8th Edition - Pathologic stage from 12/08/2020: Stage IIIB (pT3, pN1a, cM0) - Signed by Truitt Merle, MD on 12/11/2020 Stage prefix: Initial diagnosis Total positive nodes: 1 Histologic grading system: 4 grade system Histologic grade (G): G2 Residual tumor (R): R0 - None   12/08/2020 Pathology Results   FINAL MICROSCOPIC DIAGNOSIS:   A. COLON, RIGHT TRANSVERSE, COLECTOMY:  - Invasive moderately differentiated colorectal adenocarcinoma, 5 cm.  - Margins not involved.  - Metastatic carcinoma in one of twenty-three lymph nodes (1/23).  - Five tubular adenomas.  - Benign appendix.  - See oncology table.   ADDENDUM:  Mismatch Repair Protein (IHC)  SUMMARY INTERPRETATION: NORMAL    12/10/2020 Initial Diagnosis   Cancer of left colon [facility])      INTERVAL HISTORY:  Bradley Stokes is here for a follow up of colon cancer. He was last seen by me on 12/11/20 while he was in the hospital. He presents to the clinic alone. He is living in an assisted living facility. He notes he hopes to be released. He reports worsening weakness in his legs and is sitting the majority of the time. He notes his appetite is poor and attributes this to the facility's food; he notes this is in part due to that he has no teeth and cannot eat the food provided by the facility. He notes his sister is also in the same facility for issues with an infection of a stent.   All other systems were reviewed with the patient and are negative.  MEDICAL HISTORY:  Past Medical History:  Diagnosis Date   Arthritis    severe  arthritis and deformity of his knee's   Chest pain    Chronic atrial fibrillation (HCC)    Chronic renal disease, stage III (HCC)    Chronic venous stasis dermatitis    Edema    Gout    History of cardiovascular stress test 01/17/2008   EF- 62%   Hypercholesterolemia    Hyperlipidemia    Hypertension    Kidney stones    Long term (current) use of anticoagulants    Long-term (current) use of anticoagulants    Obesity    Osteoarthritis    PAF (paroxysmal atrial fibrillation) (HCC)    Paroxysmal atrial fibrillation (HCC)    Secondary hypercoagulable state ([place])     SURGICAL HISTORY: Past Surgical History:  Procedure Laterality Date   BIOPSY  12/05/2020   Procedure: BIOPSY;  Surgeon: [name], MD;  Location: [facility] ENDOSCOPY;  Service: Endoscopy;;   CARDIOLITE STRESS TEST     EF 62%   COLONOSCOPY N/A 12/05/2020   Procedure: COLONOSCOPY;  Surgeon: [name], MD;  Location: [facility] ENDOSCOPY;  Service: Endoscopy;  Laterality: N/A;   ESOPHAGOGASTRODUODENOSCOPY (EGD) WITH PROPOFOL N/A 12/05/2020   Procedure: ESOPHAGOGASTRODUODENOSCOPY (EGD) WITH PROPOFOL;  Surgeon: [name], MD;  Location: [facility] ENDOSCOPY;  Service: Endoscopy;  Laterality: N/A;   LAPAROSCOPIC LOW ANTERIOR RESCECTION WITH COLOANAL  ANASTOMOSIS  12/08/2020   Procedure: LAPAROSCOPIC LOW ANTERIOR RESCECTION WITH COLOANAL ANASTOMOSIS;  Surgeon: Kieth Brightly, Arta Bruce, MD;  Location: [place];  Service: General;;   LAPAROSCOPIC PARTIAL COLECTOMY N/A 12/08/2020   Procedure: LAPAROSCOPIC ASSISTED POSSIBLE OPEN PARTIAL COLECTOMY WITH POSSIBLE OSTOMY;  Surgeon: Kieth Brightly, Arta Bruce, MD;  Location: [place];  Service: General;  Laterality: N/A;   POLYPECTOMY  12/05/2020   Procedure: POLYPECTOMY;  Surgeon: [name], MD;  Location: [facility] ENDOSCOPY;  Service: Endoscopy;;   SUBMUCOSAL TATTOO INJECTION  12/05/2020   Procedure: SUBMUCOSAL TATTOO INJECTION;  Surgeon: [name], MD;  Location: [facility] ENDOSCOPY;  Service: Endoscopy;;    I have reviewed  the social history and family history with the patient and they are unchanged from previous note.  ALLERGIES:  is allergic to other.  MEDICATIONS:  Current Outpatient Medications  Medication Sig Dispense Refill   acetaminophen (TYLENOL) 325 MG tablet Take 325 mg by mouth every 6 (six) hours as needed (pain).      allopurinol (ZYLOPRIM) 100 MG tablet Take 100 mg by mouth daily.     amiodarone (PACERONE) 200 MG tablet 1/2 tablet daily 45 tablet 1   ammonium lactate (LAC-HYDRIN) 12 % lotion Apply 1 application topically 2 (two) times daily.     apixaban (ELIQUIS) 5 MG TABS tablet Take 1 tablet (5 mg total) by mouth 2 (two) times daily. 60 tablet    atorvastatin (LIPITOR) 20 MG tablet Take 20 mg by mouth Daily.     calcitRIOL (ROCALTROL) 0.5 MCG capsule Take 0.5 mcg daily by mouth.      furosemide (LASIX) 20 MG tablet TAKE 2 TABLETS BY MOUTH TWICE DAILY (Patient taking differently: Take 40 mg by mouth 2 (two) times daily.) 120 tablet 0   K Phos Mono-Sod Phos Di & Mono (VIRT-PHOS 250 NEUTRAL) [phone] MG TABS Take 1 tablet by mouth 2 (two) times a day.      Nutritional Supplements (,FEEDING SUPPLEMENT, PROSOURCE PLUS) liquid Take 30 mLs by mouth 3 (three) times daily between meals.     pantoprazole (PROTONIX) 40 MG tablet Take 1 tablet (40 mg total) by mouth daily.     potassium chloride SA (KLOR-CON) 20 MEQ tablet TAKE 1 TABLET BY MOUTH DAILY (Patient taking differently: Take 20 mEq by mouth daily. TAKE 1 TABLET BY MOUTH DAILY) 90 tablet 3   No current facility-administered medications for this visit.    PHYSICAL EXAMINATION: ECOG PERFORMANCE STATUS: 3 - Symptomatic, >50% confined to bed  Vitals:   01/16/21 1307  BP: 126/61  Pulse: 79  Resp: 19  Temp: 97.7 F (36.5 C)  SpO2: 98%   Wt Readings from Last 3 Encounters:  12/13/20 291 lb 10.7 oz (132.3 kg)  06/12/20 287 lb (130.2 kg)  12/21/19 298 lb 12.8 oz (135.5 kg)     GENERAL:alert, no distress and comfortable SKIN: skin  color, texture, turgor are normal, no rashes or significant lesions EYES: normal, Conjunctiva are pink and non-injected, sclera clear  LUNGS: clear to auscultation and percussion with normal breathing effort HEART: regular rate & rhythm and no murmurs and no lower extremity edema ABDOMEN:abdomen soft, non-tender and normal bowel sounds Musculoskeletal:no cyanosis of digits and no clubbing  NEURO: alert & oriented x 3 with fluent speech, no focal motor/sensory deficits  LABORATORY DATA:  I have reviewed the data as listed CBC Latest Ref Rng & Units 01/16/2021 12/12/2020 12/11/2020  WBC 4.0 - 10.5 K/uL 9.8 8.8 11.4(H)  Hemoglobin 13.0 - 17.0 g/dL 9.4(L) 9.6(L) 9.7(L)  Hematocrit 39.0 - 52.0 % 31.7(L) 31.0(L) 31.1(L)  Platelets 150 - 400 K/uL 737(H) 339 367     CMP Latest Ref Rng & Units 01/16/2021 12/12/2020 12/11/2020  Glucose 70 - 99 mg/dL 103(H) 111(H) 102(H)  BUN 8 - 23 mg/dL 32(H) 21 20  Creatinine 0.61 - 1.24 mg/dL 2.71(H) 1.76(H) 1.84(H)  Sodium 135 - 145 mmol/L 144 142 140  Potassium 3.5 - 5.1 mmol/L 4.0 4.2 4.3  Chloride 98 - 111 mmol/L 100 111 108  CO2 22 - 32 mmol/L _0 Calcium 8.9 - 10.3 mg/dL 9.9 8.8(L) 8.6(L)  Total Protein 6.5 - 8.1 g/dL 7.6 - -  Total Bilirubin 0.3 - 1.2 mg/dL 0.7 - -  Alkaline Phos 38 - 126 U/L 88 - -  AST 15 - 41 U/L 14(L) - -  ALT 0 - 44 U/L 5 - -      RADIOGRAPHIC STUDIES: I have personally reviewed the radiological images as listed and agreed with the findings in the report. No results found.    No orders of the defined types were placed in this encounter.  All questions were answered. The patient knows to call the clinic with any problems, questions or concerns. No barriers to learning was detected. The total time spent in the appointment was 30 minutes.     Truitt Merle, MD 01/16/2021   I, Wilburn Mylar, am acting as scribe for Truitt Merle, MD.   I have reviewed the above documentation for accuracy and completeness, and I agree  with the above.

## 2021-01-18 ENCOUNTER — Ambulatory Visit (INDEPENDENT_AMBULATORY_CARE_PROVIDER_SITE_OTHER): Payer: Medicare HMO | Admitting: Family

## 2021-01-18 ENCOUNTER — Encounter (HOSPITAL_BASED_OUTPATIENT_CLINIC_OR_DEPARTMENT_OTHER): Payer: Self-pay

## 2021-01-18 DIAGNOSIS — I480 Paroxysmal atrial fibrillation: Secondary | ICD-10-CM

## 2021-01-18 NOTE — Progress Notes (Deleted)
Office Visit    Patient Name: Bradley Stokes Date of Encounter: 01/18/2021  PCP:  [name], MD   [place]  Cardiologist:  Candee Furbish, MD  Advanced Practice Provider:  No care team member to display Electrophysiologist:  None   Chief Complaint    Bradley Stokes is a 75 y.o. male with a hx of PAF on amiodarone, colon cancer, CKD IV, diastolic heart failure presents today for hospital follow up   Past Medical History    Past Medical History:  Diagnosis Date   Arthritis    severe arthritis and deformity of his knee's   Chest pain    Chronic atrial fibrillation (HCC)    Chronic renal disease, stage III ([place])    Chronic venous stasis dermatitis    Edema    Gout    History of cardiovascular stress test 01/17/2008   EF- 62%   Hypercholesterolemia    Hyperlipidemia    Hypertension    Kidney stones    Long term (current) use of anticoagulants    Long-term (current) use of anticoagulants    Obesity    Osteoarthritis    PAF (paroxysmal atrial fibrillation) (HCC)    Paroxysmal atrial fibrillation ([place])    Secondary hypercoagulable state [facility])    Past Surgical History:  Procedure Laterality Date   BIOPSY  12/05/2020   Procedure: BIOPSY;  Surgeon: [name], MD;  Location: [facility] ENDOSCOPY;  Service: Endoscopy;;   CARDIOLITE STRESS TEST     EF 62%   COLONOSCOPY N/A 12/05/2020   Procedure: COLONOSCOPY;  Surgeon: [name], MD;  Location: [facility] ENDOSCOPY;  Service: Endoscopy;  Laterality: N/A;   ESOPHAGOGASTRODUODENOSCOPY (EGD) WITH PROPOFOL N/A 12/05/2020   Procedure: ESOPHAGOGASTRODUODENOSCOPY (EGD) WITH PROPOFOL;  Surgeon: [name], MD;  Location: [facility] ENDOSCOPY;  Service: Endoscopy;  Laterality: N/A;   LAPAROSCOPIC LOW ANTERIOR RESCECTION WITH COLOANAL ANASTOMOSIS  12/08/2020   Procedure: LAPAROSCOPIC LOW ANTERIOR RESCECTION WITH COLOANAL ANASTOMOSIS;  Surgeon: Kieth Brightly, Arta Bruce, MD;  Location: [place];  Service: General;;    LAPAROSCOPIC PARTIAL COLECTOMY N/A 12/08/2020   Procedure: LAPAROSCOPIC ASSISTED POSSIBLE OPEN PARTIAL COLECTOMY WITH POSSIBLE OSTOMY;  Surgeon: Kieth Brightly Arta Bruce, MD;  Location: [place];  Service: General;  Laterality: N/A;   POLYPECTOMY  12/05/2020   Procedure: POLYPECTOMY;  Surgeon: [name], MD;  Location: [facility] ENDOSCOPY;  Service: Endoscopy;;   SUBMUCOSAL TATTOO INJECTION  12/05/2020   Procedure: SUBMUCOSAL TATTOO INJECTION;  Surgeon: [name], MD;  Location: [facility] ENDOSCOPY;  Service: Endoscopy;;    Allergies  Allergies  Allergen Reactions   Other     Tape,needs paper tape    History of Present Illness    Bradley Stokes is a 75 y.o. male with a hx of PAF on amiodarone, colon cancer, CKD IV, diastolic heart failure last seen 05/2020.  Admitted 12/02/20-12/13/20 for lower GI bleeding. Endoscopy evaluation revealing mass in the proximal descending colon. Biopsy confirmed adenocarcinoma and underwent right sided colectomy. Coumadin was transitioned to Eliquis due to concern for interaction with chemotherapy.   He presents today for follow up. ***  EKGs/Labs/Other Studies Reviewed:   The following studies were reviewed today:  Echo 12/2014 Left ventricle: The cavity size was normal. Wall thickness was    normal. Systolic function was normal. The estimated ejection    fraction was in the range of 55% to 60%.  - Aortic valve: There was mild regurgitation.  - Left atrium: The atrium was moderately dilated.  EKG: No EKG today  Recent Labs: 06/12/2020: TSH 1.450 12/07/2020: B Natriuretic Peptide 80.7; Magnesium 2.0 01/16/2021: ALT 5; BUN 32; Creatinine 2.71; Hemoglobin 9.4; Platelet Count 737; Potassium 4.0; Sodium 144  Recent Lipid Panel    Component Value Date/Time   CHOL 131 12/21/2019 1155   TRIG 120 12/21/2019 1155   HDL 48 12/21/2019 1155   CHOLHDL 2.7 12/21/2019 1155   CHOLHDL 3 11/22/2013 1111   VLDL 25.0 11/22/2013 1111   LDLCALC 62 12/21/2019 1155    Risk  Assessment/Calculations:   CHA2DS2-VASc Score = 4  This indicates a 4.8% annual risk of stroke. The patient's score is based upon: CHF History: 1 HTN History: 1 Diabetes History: 0 Stroke History: 0 Vascular Disease History: 0 Age Score: 2 Gender Score: 0   Home Medications   No outpatient medications have been marked as taking for the 01/18/21 encounter (Office Visit) with Loel Dubonnet, NP.     Review of Systems      All other systems reviewed and are otherwise negative except as noted above.  Physical Exam    VS:  There were no vitals taken for this visit. , BMI There is no height or weight on file to calculate BMI.  Wt Readings from Last 3 Encounters:  12/13/20 291 lb 10.7 oz (132.3 kg)  06/12/20 287 lb (130.2 kg)  12/21/19 298 lb 12.8 oz (135.5 kg)     GEN: Well nourished, well developed, in no acute distress. HEENT: normal. Neck: Supple, no JVD, carotid bruits, or masses. Cardiac: ***RRR, no murmurs, rubs, or gallops. No clubbing, cyanosis, edema.  ***Radials/PT 2+ and equal bilaterally.  Respiratory:  ***Respirations regular and unlabored, clear to auscultation bilaterally. GI: Soft, nontender, nondistended. MS: No deformity or atrophy. Skin: Warm and dry, no rash. Neuro:  Strength and sensation are intact. Psych: Normal affect.  Assessment & Plan    PAF / Chronic anticoagulation -   CKD IV - Careful titration of diuretic and antihypertensive.  12/12/20 creat 1.76, GFR 39 and 01/16/21 creat 2.71, GFR 23. ***  Colon cancer - s/p right colectomy 11/2020. Follows with oncology. Plan for surveillance CT every 6-12 mos for up to 5 years and surveillance labs/physical exam every 3 mos for first 2 years.  Anemia - 01/16/21 Hb 9.4. Multifactorial recent admission with GI bleeding due to cancer, CKD. Recommend dietary sources of iron. Has routine labs for monitoring scheduled with oncology.   Chronic diastolic heart failure -   Disposition: Follow up {follow  up:15908} with Dr. Marlou Porch or APP.  Signed, Loel Dubonnet, NP 01/18/2021, 10:24 AM [place]

## 2021-01-21 DIAGNOSIS — N179 Acute kidney failure, unspecified: Secondary | ICD-10-CM | POA: Diagnosis not present

## 2021-01-21 DIAGNOSIS — E860 Dehydration: Secondary | ICD-10-CM | POA: Diagnosis not present

## 2021-01-21 DIAGNOSIS — I5032 Chronic diastolic (congestive) heart failure: Secondary | ICD-10-CM | POA: Diagnosis not present

## 2021-01-21 DIAGNOSIS — R1084 Generalized abdominal pain: Secondary | ICD-10-CM | POA: Diagnosis not present

## 2021-01-21 DIAGNOSIS — R627 Adult failure to thrive: Secondary | ICD-10-CM | POA: Diagnosis not present

## 2021-01-21 DIAGNOSIS — C189 Malignant neoplasm of colon, unspecified: Secondary | ICD-10-CM | POA: Diagnosis not present

## 2021-01-21 DIAGNOSIS — R630 Anorexia: Secondary | ICD-10-CM | POA: Diagnosis not present

## 2021-01-21 DIAGNOSIS — N1832 Chronic kidney disease, stage 3b: Secondary | ICD-10-CM | POA: Diagnosis not present

## 2021-01-21 DIAGNOSIS — R110 Nausea: Secondary | ICD-10-CM | POA: Diagnosis not present

## 2021-01-21 NOTE — Progress Notes (Signed)
Patient no - show. Encounter entered in error  Loel Dubonnet, NP

## 2021-01-22 DIAGNOSIS — M25572 Pain in left ankle and joints of left foot: Secondary | ICD-10-CM | POA: Diagnosis not present

## 2021-01-22 DIAGNOSIS — R262 Difficulty in walking, not elsewhere classified: Secondary | ICD-10-CM | POA: Diagnosis not present

## 2021-01-22 DIAGNOSIS — R5381 Other malaise: Secondary | ICD-10-CM | POA: Diagnosis not present

## 2021-01-22 DIAGNOSIS — M6281 Muscle weakness (generalized): Secondary | ICD-10-CM | POA: Diagnosis not present

## 2021-01-24 DIAGNOSIS — N179 Acute kidney failure, unspecified: Secondary | ICD-10-CM | POA: Diagnosis not present

## 2021-01-24 DIAGNOSIS — I5032 Chronic diastolic (congestive) heart failure: Secondary | ICD-10-CM | POA: Diagnosis not present

## 2021-01-24 DIAGNOSIS — N1832 Chronic kidney disease, stage 3b: Secondary | ICD-10-CM | POA: Diagnosis not present

## 2021-01-24 DIAGNOSIS — R630 Anorexia: Secondary | ICD-10-CM | POA: Diagnosis not present

## 2021-01-24 DIAGNOSIS — E43 Unspecified severe protein-calorie malnutrition: Secondary | ICD-10-CM | POA: Diagnosis not present

## 2021-01-24 DIAGNOSIS — C189 Malignant neoplasm of colon, unspecified: Secondary | ICD-10-CM | POA: Diagnosis not present

## 2021-01-28 DIAGNOSIS — G894 Chronic pain syndrome: Secondary | ICD-10-CM | POA: Diagnosis not present

## 2021-01-28 DIAGNOSIS — N179 Acute kidney failure, unspecified: Secondary | ICD-10-CM | POA: Diagnosis not present

## 2021-01-28 DIAGNOSIS — C189 Malignant neoplasm of colon, unspecified: Secondary | ICD-10-CM | POA: Diagnosis not present

## 2021-01-28 DIAGNOSIS — R110 Nausea: Secondary | ICD-10-CM | POA: Diagnosis not present

## 2021-01-28 DIAGNOSIS — N1832 Chronic kidney disease, stage 3b: Secondary | ICD-10-CM | POA: Diagnosis not present

## 2021-01-28 DIAGNOSIS — M130 Polyarthritis, unspecified: Secondary | ICD-10-CM | POA: Diagnosis not present

## 2021-01-29 DIAGNOSIS — R262 Difficulty in walking, not elsewhere classified: Secondary | ICD-10-CM | POA: Diagnosis not present

## 2021-01-29 DIAGNOSIS — R5381 Other malaise: Secondary | ICD-10-CM | POA: Diagnosis not present

## 2021-01-29 DIAGNOSIS — M6281 Muscle weakness (generalized): Secondary | ICD-10-CM | POA: Diagnosis not present

## 2021-01-29 DIAGNOSIS — M25572 Pain in left ankle and joints of left foot: Secondary | ICD-10-CM | POA: Diagnosis not present

## 2021-01-31 DIAGNOSIS — N1832 Chronic kidney disease, stage 3b: Secondary | ICD-10-CM | POA: Diagnosis not present

## 2021-01-31 DIAGNOSIS — I5032 Chronic diastolic (congestive) heart failure: Secondary | ICD-10-CM | POA: Diagnosis not present

## 2021-01-31 DIAGNOSIS — D649 Anemia, unspecified: Secondary | ICD-10-CM | POA: Diagnosis not present

## 2021-02-04 DIAGNOSIS — R1084 Generalized abdominal pain: Secondary | ICD-10-CM | POA: Diagnosis not present

## 2021-02-04 DIAGNOSIS — R110 Nausea: Secondary | ICD-10-CM | POA: Diagnosis not present

## 2021-02-04 DIAGNOSIS — C189 Malignant neoplasm of colon, unspecified: Secondary | ICD-10-CM | POA: Diagnosis not present

## 2021-02-05 ENCOUNTER — Telehealth: Payer: Self-pay | Admitting: Hematology

## 2021-02-05 DIAGNOSIS — M6281 Muscle weakness (generalized): Secondary | ICD-10-CM | POA: Diagnosis not present

## 2021-02-05 DIAGNOSIS — R262 Difficulty in walking, not elsewhere classified: Secondary | ICD-10-CM | POA: Diagnosis not present

## 2021-02-05 DIAGNOSIS — M25572 Pain in left ankle and joints of left foot: Secondary | ICD-10-CM | POA: Diagnosis not present

## 2021-02-05 DIAGNOSIS — R5381 Other malaise: Secondary | ICD-10-CM | POA: Diagnosis not present

## 2021-02-05 NOTE — Telephone Encounter (Signed)
Sch per 11/4 inbasket, attempted to leave message, line busy

## 2021-02-15 DIAGNOSIS — L908 Other atrophic disorders of skin: Secondary | ICD-10-CM | POA: Diagnosis not present

## 2021-02-15 DIAGNOSIS — L853 Xerosis cutis: Secondary | ICD-10-CM | POA: Diagnosis not present

## 2021-02-15 DIAGNOSIS — I739 Peripheral vascular disease, unspecified: Secondary | ICD-10-CM | POA: Diagnosis not present

## 2021-02-15 DIAGNOSIS — L309 Dermatitis, unspecified: Secondary | ICD-10-CM | POA: Diagnosis not present

## 2021-02-18 DIAGNOSIS — R002 Palpitations: Secondary | ICD-10-CM | POA: Diagnosis not present

## 2021-02-18 DIAGNOSIS — D631 Anemia in chronic kidney disease: Secondary | ICD-10-CM | POA: Diagnosis not present

## 2021-02-18 DIAGNOSIS — Z6841 Body Mass Index (BMI) 40.0 and over, adult: Secondary | ICD-10-CM | POA: Diagnosis not present

## 2021-02-18 DIAGNOSIS — I4891 Unspecified atrial fibrillation: Secondary | ICD-10-CM | POA: Diagnosis not present

## 2021-02-18 DIAGNOSIS — N2581 Secondary hyperparathyroidism of renal origin: Secondary | ICD-10-CM | POA: Diagnosis not present

## 2021-02-18 DIAGNOSIS — E785 Hyperlipidemia, unspecified: Secondary | ICD-10-CM | POA: Diagnosis not present

## 2021-02-18 DIAGNOSIS — Z7901 Long term (current) use of anticoagulants: Secondary | ICD-10-CM | POA: Diagnosis not present

## 2021-02-18 DIAGNOSIS — N183 Chronic kidney disease, stage 3 unspecified: Secondary | ICD-10-CM | POA: Diagnosis not present

## 2021-02-18 DIAGNOSIS — I129 Hypertensive chronic kidney disease with stage 1 through stage 4 chronic kidney disease, or unspecified chronic kidney disease: Secondary | ICD-10-CM | POA: Diagnosis not present

## 2021-02-18 DIAGNOSIS — E876 Hypokalemia: Secondary | ICD-10-CM | POA: Diagnosis not present

## 2021-02-18 DIAGNOSIS — N189 Chronic kidney disease, unspecified: Secondary | ICD-10-CM | POA: Diagnosis not present

## 2021-02-25 ENCOUNTER — Other Ambulatory Visit: Payer: Self-pay

## 2021-02-25 DIAGNOSIS — C186 Malignant neoplasm of descending colon: Secondary | ICD-10-CM

## 2021-02-25 NOTE — Progress Notes (Signed)
CBC w/Diff, CMP, & Iron panel ordered

## 2021-02-26 ENCOUNTER — Inpatient Hospital Stay: Payer: Medicare HMO | Attending: Hematology

## 2021-02-26 ENCOUNTER — Encounter: Payer: Self-pay | Admitting: Hematology

## 2021-02-26 ENCOUNTER — Other Ambulatory Visit: Payer: Self-pay

## 2021-02-26 ENCOUNTER — Inpatient Hospital Stay (HOSPITAL_BASED_OUTPATIENT_CLINIC_OR_DEPARTMENT_OTHER): Payer: Medicare HMO | Admitting: Hematology

## 2021-02-26 VITALS — BP 147/66 | HR 70 | Temp 97.8°F | Resp 18

## 2021-02-26 DIAGNOSIS — I129 Hypertensive chronic kidney disease with stage 1 through stage 4 chronic kidney disease, or unspecified chronic kidney disease: Secondary | ICD-10-CM | POA: Insufficient documentation

## 2021-02-26 DIAGNOSIS — D631 Anemia in chronic kidney disease: Secondary | ICD-10-CM | POA: Insufficient documentation

## 2021-02-26 DIAGNOSIS — C186 Malignant neoplasm of descending colon: Secondary | ICD-10-CM

## 2021-02-26 DIAGNOSIS — Z9049 Acquired absence of other specified parts of digestive tract: Secondary | ICD-10-CM | POA: Insufficient documentation

## 2021-02-26 DIAGNOSIS — D649 Anemia, unspecified: Secondary | ICD-10-CM | POA: Diagnosis not present

## 2021-02-26 DIAGNOSIS — N184 Chronic kidney disease, stage 4 (severe): Secondary | ICD-10-CM | POA: Insufficient documentation

## 2021-02-26 DIAGNOSIS — I4891 Unspecified atrial fibrillation: Secondary | ICD-10-CM | POA: Diagnosis not present

## 2021-02-26 DIAGNOSIS — L989 Disorder of the skin and subcutaneous tissue, unspecified: Secondary | ICD-10-CM | POA: Diagnosis not present

## 2021-02-26 LAB — CBC WITH DIFFERENTIAL (CANCER CENTER ONLY)
Abs Immature Granulocytes: 0.03 10*3/uL (ref 0.00–0.07)
Basophils Absolute: 0.1 10*3/uL (ref 0.0–0.1)
Basophils Relative: 1 %
Eosinophils Absolute: 0.4 10*3/uL (ref 0.0–0.5)
Eosinophils Relative: 4 %
HCT: 35.1 % — ABNORMAL LOW (ref 39.0–52.0)
Hemoglobin: 10.4 g/dL — ABNORMAL LOW (ref 13.0–17.0)
Immature Granulocytes: 0 %
Lymphocytes Relative: 22 %
Lymphs Abs: 1.9 10*3/uL (ref 0.7–4.0)
MCH: 28.2 pg (ref 26.0–34.0)
MCHC: 29.6 g/dL — ABNORMAL LOW (ref 30.0–36.0)
MCV: 95.1 fL (ref 80.0–100.0)
Monocytes Absolute: 0.7 10*3/uL (ref 0.1–1.0)
Monocytes Relative: 8 %
Neutro Abs: 5.8 10*3/uL (ref 1.7–7.7)
Neutrophils Relative %: 65 %
Platelet Count: 330 10*3/uL (ref 150–400)
RBC: 3.69 MIL/uL — ABNORMAL LOW (ref 4.22–5.81)
RDW: 18 % — ABNORMAL HIGH (ref 11.5–15.5)
WBC Count: 8.8 10*3/uL (ref 4.0–10.5)
nRBC: 0 % (ref 0.0–0.2)

## 2021-02-26 LAB — CMP (CANCER CENTER ONLY)
ALT: 7 U/L (ref 0–44)
AST: 14 U/L — ABNORMAL LOW (ref 15–41)
Albumin: 3.2 g/dL — ABNORMAL LOW (ref 3.5–5.0)
Alkaline Phosphatase: 101 U/L (ref 38–126)
Anion gap: 6 (ref 5–15)
BUN: 26 mg/dL — ABNORMAL HIGH (ref 8–23)
CO2: 31 mmol/L (ref 22–32)
Calcium: 9.3 mg/dL (ref 8.9–10.3)
Chloride: 103 mmol/L (ref 98–111)
Creatinine: 2.16 mg/dL — ABNORMAL HIGH (ref 0.61–1.24)
GFR, Estimated: 31 mL/min — ABNORMAL LOW (ref >60)
Glucose, Bld: 98 mg/dL (ref 70–99)
Potassium: 4.7 mmol/L (ref 3.5–5.1)
Sodium: 140 mmol/L (ref 135–145)
Total Bilirubin: 1.1 mg/dL (ref 0.3–1.2)
Total Protein: 7.1 g/dL (ref 6.5–8.1)

## 2021-02-26 LAB — IRON AND TIBC
Iron: 43 ug/dL — ABNORMAL LOW (ref 45–182)
Saturation Ratios: 13 % — ABNORMAL LOW (ref 17.9–39.5)
TIBC: 321 ug/dL (ref 250–450)
UIBC: 278 ug/dL

## 2021-02-26 NOTE — Progress Notes (Signed)
[place]   Telephone:(336) [phone] Fax:(336) [phone]   Clinic Follow up Note   Patient Care Team: [name], [name], MD as PCP - General (Family Medicine) Jerline Pain, MD as PCP - Cardiology (Cardiology)  Date of Service:  02/26/2021  CHIEF COMPLAINT: f/u of colon cancer  CURRENT THERAPY:  Surveillance  ASSESSMENT & PLAN:  Bradley Stokes is a 75 y.o. male with   1. Cancer of left colon, pT3N1M0 stage IIIB -Diagnosed in 11/2020 -he had right colectomy on 12/08/20 with Dr. Kieth Brightly. Pathology showed 5 cm tumor, margins not involved, one of 23 lymph nodes involved, and five tubular adenomas -liver MRI on 12/10/20 showed two probable liver cysts, not considered suspicious for metastatic disease. -due to his poor performance status, and stage IV kidney disease, he was not a candidate for adjuvant chemo  -He still in rehab, overall condition has improved some.  Exam was unremarkable, lab reviewed, no clinical concern for recurrence -Follow-up in 4 months with surveillance CT scan   2. HTN, AF, CKD stage IV -F/u with PCP   3. Anemia  -Secondary to recent surgery, colon cancer, and chronic kidney disease -continue oral iron    4. Social support -he previously lived with his sister. They are now both in the same skilled rehab facility for different reasons.     PLAN: -lab reviewed -F/u the first week of March 2023 with lab and CT CAP wo cotnrast (w orla contrast) a few days before     No problem-specific Assessment & Plan notes found for this encounter.   SUMMARY OF ONCOLOGIC HISTORY: Oncology History Overview Note  Cancer Staging Cancer of left colon [facility]) Staging form: Colon and Rectum, AJCC 8th Edition - Pathologic stage from 12/08/2020: Stage IIIB (pT3, pN1a, cM0) - Signed by Truitt Merle, MD on 12/11/2020    Cancer of left colon ([place])  12/05/2020 Procedure   Colonoscopy, under Dr. Collene Mares  Impression: - Preparation of the colon was extremely poor  inspite of aggressive lavage. - Diverticulosis in the sigmoid colon. - Likely malignant 5 cm tumor in the proximal descending colon-biopsied; mucosa 5 cm distal to the mass was injected with 3 cc's of Niger ink. - One 10 mm polyp in the cecum, removed with a hot snare x 1; resected and retrieved. - Moderate sized internal hemorrhoids.   12/05/2020 Pathology Results   FINAL MICROSCOPIC DIAGNOSIS:   A. COLON, CECAL, POLYPECTOMY:  - Tubular adenoma  - Negative for high-grade dysplasia or malignancy   B. COLON MASS, DESCENDING, BIOPSY:  - Invasive moderately differentiated adenocarcinoma, see comment    ADDENDUM: Mismatch Repair Protein (IHC)  SUMMARY INTERPRETATION: NORMAL    12/06/2020 Imaging   CT CAP  IMPRESSION: 1. Short segment of colonic wall thickening involving the splenic flexure likely reflects patient's presumed colonic malignancy. 2. Single 1.3 cm lymph node posterior to the colonic lesion likely reflecting local nodal involvement. No additional pathologically enlarged abdominal or pelvic lymph nodes to suggest distant nodal metastases. 3. Two subcentimeter hypodense hepatic lesions, incompletely characterized on this examination. Possibly reflecting benign hepatic cysts but cystic hepatic metastases not excluded on this study. Consider attention on close interval follow-up imaging versus further evaluation with hepatic protocol MRI with and without contrast, preferably as an outpatient when patient is stable and able to follow commands. 4. Healing nonpathologic left lateral eighth and ninth rib fractures. 5. Aortic Atherosclerosis (ICD10-I70.0).   12/08/2020 Cancer Staging   Staging form: Colon and Rectum, AJCC 8th Edition -  Pathologic stage from 12/08/2020: Stage IIIB (pT3, pN1a, cM0) - Signed by Truitt Merle, MD on 12/11/2020 Stage prefix: Initial diagnosis Total positive nodes: 1 Histologic grading system: 4 grade system Histologic grade (G): G2 Residual tumor (R): R0  - None    12/08/2020 Pathology Results   FINAL MICROSCOPIC DIAGNOSIS:   A. COLON, RIGHT TRANSVERSE, COLECTOMY:  - Invasive moderately differentiated colorectal adenocarcinoma, 5 cm.  - Margins not involved.  - Metastatic carcinoma in one of twenty-three lymph nodes (1/23).  - Five tubular adenomas.  - Benign appendix.  - See oncology table.   ADDENDUM:  Mismatch Repair Protein (IHC)  SUMMARY INTERPRETATION: NORMAL    12/10/2020 Initial Diagnosis   Cancer of left colon [facility])      INTERVAL HISTORY:  Bradley Stokes is here for a follow up of colon cancer. He was last seen by me on 01/16/21. He presents to the clinic alone in a wheelchair.  He is overall feeling better, he is still in the rehab, continues physical therapy once a day.  He is able to walk about 100 feet with a walker, remains very sedentary.  His appetite is normal, denies any significant pain, or other new complaints.   All other systems were reviewed with the patient and are negative.  MEDICAL HISTORY:  Past Medical History:  Diagnosis Date   Arthritis    severe arthritis and deformity of his knee's   Chest pain    Chronic atrial fibrillation (HCC)    Chronic renal disease, stage III (HCC)    Chronic venous stasis dermatitis    Edema    Gout    History of cardiovascular stress test 01/17/2008   EF- 62%   Hypercholesterolemia    Hyperlipidemia    Hypertension    Kidney stones    Long term (current) use of anticoagulants    Long-term (current) use of anticoagulants    Obesity    Osteoarthritis    PAF (paroxysmal atrial fibrillation) (HCC)    Paroxysmal atrial fibrillation (HCC)    Secondary hypercoagulable state ([place])     SURGICAL HISTORY: Past Surgical History:  Procedure Laterality Date   BIOPSY  12/05/2020   Procedure: BIOPSY;  Surgeon: [name], MD;  Location: [facility] ENDOSCOPY;  Service: Endoscopy;;   CARDIOLITE STRESS TEST     EF 62%   COLONOSCOPY N/A 12/05/2020   Procedure: COLONOSCOPY;   Surgeon: [name], MD;  Location: [facility] ENDOSCOPY;  Service: Endoscopy;  Laterality: N/A;   ESOPHAGOGASTRODUODENOSCOPY (EGD) WITH PROPOFOL N/A 12/05/2020   Procedure: ESOPHAGOGASTRODUODENOSCOPY (EGD) WITH PROPOFOL;  Surgeon: [name], MD;  Location: [facility] ENDOSCOPY;  Service: Endoscopy;  Laterality: N/A;   LAPAROSCOPIC LOW ANTERIOR RESCECTION WITH COLOANAL ANASTOMOSIS  12/08/2020   Procedure: LAPAROSCOPIC LOW ANTERIOR RESCECTION WITH COLOANAL ANASTOMOSIS;  Surgeon: Kieth Brightly, Arta Bruce, MD;  Location: [place];  Service: General;;   LAPAROSCOPIC PARTIAL COLECTOMY N/A 12/08/2020   Procedure: LAPAROSCOPIC ASSISTED POSSIBLE OPEN PARTIAL COLECTOMY WITH POSSIBLE OSTOMY;  Surgeon: Kieth Brightly, Arta Bruce, MD;  Location: [place];  Service: General;  Laterality: N/A;   POLYPECTOMY  12/05/2020   Procedure: POLYPECTOMY;  Surgeon: [name], MD;  Location: [facility] ENDOSCOPY;  Service: Endoscopy;;   SUBMUCOSAL TATTOO INJECTION  12/05/2020   Procedure: SUBMUCOSAL TATTOO INJECTION;  Surgeon: [name], MD;  Location: [facility] ENDOSCOPY;  Service: Endoscopy;;    I have reviewed the social history and family history with the patient and they are unchanged from previous note.  ALLERGIES:  is allergic to other.  MEDICATIONS:  Current Outpatient Medications  Medication Sig Dispense Refill   acetaminophen (TYLENOL) 325 MG tablet Take 325 mg by mouth every 6 (six) hours as needed (pain).      allopurinol (ZYLOPRIM) 100 MG tablet Take 100 mg by mouth daily.     amiodarone (PACERONE) 200 MG tablet 1/2 tablet daily 45 tablet 1   ammonium lactate (LAC-HYDRIN) 12 % lotion Apply 1 application topically 2 (two) times daily.     apixaban (ELIQUIS) 5 MG TABS tablet Take 1 tablet (5 mg total) by mouth 2 (two) times daily. 60 tablet    atorvastatin (LIPITOR) 20 MG tablet Take 20 mg by mouth Daily.     calcitRIOL (ROCALTROL) 0.5 MCG capsule Take 0.5 mcg daily by mouth.      furosemide (LASIX) 20 MG tablet TAKE 2 TABLETS BY MOUTH TWICE  DAILY (Patient taking differently: Take 40 mg by mouth 2 (two) times daily.) 120 tablet 0   K Phos Mono-Sod Phos Di & Mono (VIRT-PHOS 250 NEUTRAL) [phone] MG TABS Take 1 tablet by mouth 2 (two) times a day.      Nutritional Supplements (,FEEDING SUPPLEMENT, PROSOURCE PLUS) liquid Take 30 mLs by mouth 3 (three) times daily between meals.     pantoprazole (PROTONIX) 40 MG tablet Take 1 tablet (40 mg total) by mouth daily.     potassium chloride SA (KLOR-CON) 20 MEQ tablet TAKE 1 TABLET BY MOUTH DAILY (Patient taking differently: Take 20 mEq by mouth daily. TAKE 1 TABLET BY MOUTH DAILY) 90 tablet 3   No current facility-administered medications for this visit.    PHYSICAL EXAMINATION: ECOG PERFORMANCE STATUS: 3 - Symptomatic, >50% confined to bed  Vitals:   02/26/21 1108  BP: (!) 147/66  Pulse: 70  Resp: 18  Temp: 97.8 F (36.6 C)  SpO2: 100%   Wt Readings from Last 3 Encounters:  12/13/20 291 lb 10.7 oz (132.3 kg)  06/12/20 287 lb (130.2 kg)  12/21/19 298 lb 12.8 oz (135.5 kg)     GENERAL:alert, no distress and comfortable SKIN: skin color, texture, turgor are normal, no rashes or significant lesions EYES: normal, Conjunctiva are pink and non-injected, sclera clear NECK: supple, thyroid normal size, non-tender, without nodularity LYMPH:  no palpable lymphadenopathy in the cervical, axillary  LUNGS: clear to auscultation and percussion with normal breathing effort HEART: regular rate & rhythm and no murmurs and no lower extremity edema ABDOMEN:abdomen soft, non-tender and normal bowel sounds, midline incision has healed well. Musculoskeletal:no cyanosis of digits and no clubbing  NEURO: alert & oriented x 3 with fluent speech, no focal motor/sensory deficits  LABORATORY DATA:  I have reviewed the data as listed CBC Latest Ref Rng & Units 02/26/2021 01/16/2021 12/12/2020  WBC 4.0 - 10.5 K/uL 8.8 9.8 8.8  Hemoglobin 13.0 - 17.0 g/dL 10.4(L) 9.4(L) 9.6(L)  Hematocrit 39.0 -  52.0 % 35.1(L) 31.7(L) 31.0(L)  Platelets 150 - 400 K/uL 330 737(H) 339     CMP Latest Ref Rng & Units 02/26/2021 01/16/2021 12/12/2020  Glucose 70 - 99 mg/dL 98 103(H) 111(H)  BUN 8 - 23 mg/dL 26(H) 32(H) 21  Creatinine 0.61 - 1.24 mg/dL 2.16(H) 2.71(H) 1.76(H)  Sodium 135 - 145 mmol/L 140 144 142  Potassium 3.5 - 5.1 mmol/L 4.7 4.0 4.2  Chloride 98 - 111 mmol/L 103 100 111  CO2 22 - 32 mmol/L _0 Calcium 8.9 - 10.3 mg/dL 9.3 9.9 8.8(L)  Total Protein 6.5 - 8.1 g/dL 7.1 7.6 -  Total Bilirubin 0.3 -  1.2 mg/dL 1.1 0.7 -  Alkaline Phos 38 - 126 U/L 101 88 -  AST 15 - 41 U/L 14(L) 14(L) -  ALT 0 - 44 U/L 7 5 -      RADIOGRAPHIC STUDIES: I have personally reviewed the radiological images as listed and agreed with the findings in the report. No results found.    Orders Placed This Encounter  Procedures   CT CHEST ABDOMEN PELVIS WO CONTRAST    Standing Status:   Future    Standing Expiration Date:   02/26/2022    Order Specific Question:   If indicated for the ordered procedure, I authorize the administration of contrast media per Radiology protocol    Answer:   Yes    Order Specific Question:   Preferred imaging location?    Answer:   [facility]    Order Specific Question:   Release to patient    Answer:   Immediate    Order Specific Question:   Is Oral Contrast requested for this exam?    Answer:   Yes, Per Radiology protocol    Order Specific Question:   Reason for Exam (SYMPTOM  OR DIAGNOSIS REQUIRED)    Answer:   rule out cancer recurrence   All questions were answered. The patient knows to call the clinic with any problems, questions or concerns. No barriers to learning was detected. The total time spent in the appointment was 30 minutes.     Truitt Merle, MD 02/26/2021   I, Wilburn Mylar, am acting as scribe for Truitt Merle, MD.   I have reviewed the above documentation for accuracy and completeness, and I agree with the above.

## 2021-03-07 DIAGNOSIS — L853 Xerosis cutis: Secondary | ICD-10-CM | POA: Diagnosis not present

## 2021-03-07 DIAGNOSIS — I739 Peripheral vascular disease, unspecified: Secondary | ICD-10-CM | POA: Diagnosis not present

## 2021-03-07 DIAGNOSIS — S91104A Unspecified open wound of right lesser toe(s) without damage to nail, initial encounter: Secondary | ICD-10-CM | POA: Diagnosis not present

## 2021-03-11 DIAGNOSIS — R051 Acute cough: Secondary | ICD-10-CM | POA: Diagnosis not present

## 2021-03-11 DIAGNOSIS — R070 Pain in throat: Secondary | ICD-10-CM | POA: Diagnosis not present

## 2021-03-11 DIAGNOSIS — R0989 Other specified symptoms and signs involving the circulatory and respiratory systems: Secondary | ICD-10-CM | POA: Diagnosis not present

## 2021-03-11 DIAGNOSIS — R5381 Other malaise: Secondary | ICD-10-CM | POA: Diagnosis not present

## 2021-03-11 DIAGNOSIS — R5383 Other fatigue: Secondary | ICD-10-CM | POA: Diagnosis not present

## 2021-03-12 DIAGNOSIS — R051 Acute cough: Secondary | ICD-10-CM | POA: Diagnosis not present

## 2021-03-12 DIAGNOSIS — R5383 Other fatigue: Secondary | ICD-10-CM | POA: Diagnosis not present

## 2021-03-12 DIAGNOSIS — R5381 Other malaise: Secondary | ICD-10-CM | POA: Diagnosis not present

## 2021-03-12 DIAGNOSIS — S91104D Unspecified open wound of right lesser toe(s) without damage to nail, subsequent encounter: Secondary | ICD-10-CM | POA: Diagnosis not present

## 2021-03-12 DIAGNOSIS — R0989 Other specified symptoms and signs involving the circulatory and respiratory systems: Secondary | ICD-10-CM | POA: Diagnosis not present

## 2021-03-12 DIAGNOSIS — J189 Pneumonia, unspecified organism: Secondary | ICD-10-CM | POA: Diagnosis not present

## 2021-03-12 DIAGNOSIS — I739 Peripheral vascular disease, unspecified: Secondary | ICD-10-CM | POA: Diagnosis not present

## 2021-03-12 DIAGNOSIS — J90 Pleural effusion, not elsewhere classified: Secondary | ICD-10-CM | POA: Diagnosis not present

## 2021-03-12 DIAGNOSIS — I872 Venous insufficiency (chronic) (peripheral): Secondary | ICD-10-CM | POA: Diagnosis not present

## 2021-03-19 DIAGNOSIS — I739 Peripheral vascular disease, unspecified: Secondary | ICD-10-CM | POA: Diagnosis not present

## 2021-03-19 DIAGNOSIS — I872 Venous insufficiency (chronic) (peripheral): Secondary | ICD-10-CM | POA: Diagnosis not present

## 2021-03-22 DIAGNOSIS — Z7901 Long term (current) use of anticoagulants: Secondary | ICD-10-CM | POA: Diagnosis not present

## 2021-03-22 DIAGNOSIS — R600 Localized edema: Secondary | ICD-10-CM | POA: Diagnosis not present

## 2021-03-22 DIAGNOSIS — I480 Paroxysmal atrial fibrillation: Secondary | ICD-10-CM | POA: Diagnosis not present

## 2021-03-22 DIAGNOSIS — E46 Unspecified protein-calorie malnutrition: Secondary | ICD-10-CM | POA: Diagnosis not present

## 2021-03-22 DIAGNOSIS — C186 Malignant neoplasm of descending colon: Secondary | ICD-10-CM | POA: Diagnosis not present

## 2021-03-22 DIAGNOSIS — I5032 Chronic diastolic (congestive) heart failure: Secondary | ICD-10-CM | POA: Diagnosis not present

## 2021-03-22 DIAGNOSIS — I872 Venous insufficiency (chronic) (peripheral): Secondary | ICD-10-CM | POA: Diagnosis not present

## 2021-03-22 DIAGNOSIS — Z9049 Acquired absence of other specified parts of digestive tract: Secondary | ICD-10-CM | POA: Diagnosis not present

## 2021-03-22 DIAGNOSIS — N1832 Chronic kidney disease, stage 3b: Secondary | ICD-10-CM | POA: Diagnosis not present

## 2021-04-04 DIAGNOSIS — C189 Malignant neoplasm of colon, unspecified: Secondary | ICD-10-CM | POA: Diagnosis not present

## 2021-04-04 DIAGNOSIS — M109 Gout, unspecified: Secondary | ICD-10-CM | POA: Diagnosis not present

## 2021-04-04 DIAGNOSIS — R058 Other specified cough: Secondary | ICD-10-CM | POA: Diagnosis not present

## 2021-04-04 DIAGNOSIS — I509 Heart failure, unspecified: Secondary | ICD-10-CM | POA: Diagnosis not present

## 2021-04-04 DIAGNOSIS — E785 Hyperlipidemia, unspecified: Secondary | ICD-10-CM | POA: Diagnosis not present

## 2021-04-04 DIAGNOSIS — K922 Gastrointestinal hemorrhage, unspecified: Secondary | ICD-10-CM | POA: Diagnosis not present

## 2021-04-04 DIAGNOSIS — I4891 Unspecified atrial fibrillation: Secondary | ICD-10-CM | POA: Diagnosis not present

## 2021-04-04 DIAGNOSIS — L97909 Non-pressure chronic ulcer of unspecified part of unspecified lower leg with unspecified severity: Secondary | ICD-10-CM | POA: Diagnosis not present

## 2021-04-04 DIAGNOSIS — M159 Polyosteoarthritis, unspecified: Secondary | ICD-10-CM | POA: Diagnosis not present

## 2021-04-08 DIAGNOSIS — R059 Cough, unspecified: Secondary | ICD-10-CM | POA: Diagnosis not present

## 2021-04-11 DIAGNOSIS — Z79899 Other long term (current) drug therapy: Secondary | ICD-10-CM | POA: Diagnosis not present

## 2021-04-11 DIAGNOSIS — E119 Type 2 diabetes mellitus without complications: Secondary | ICD-10-CM | POA: Diagnosis not present

## 2021-04-11 DIAGNOSIS — E7849 Other hyperlipidemia: Secondary | ICD-10-CM | POA: Diagnosis not present

## 2021-04-11 DIAGNOSIS — E038 Other specified hypothyroidism: Secondary | ICD-10-CM | POA: Diagnosis not present

## 2021-04-11 DIAGNOSIS — D518 Other vitamin B12 deficiency anemias: Secondary | ICD-10-CM | POA: Diagnosis not present

## 2021-04-17 ENCOUNTER — Ambulatory Visit: Payer: Medicare HMO | Admitting: Hematology

## 2021-04-17 ENCOUNTER — Other Ambulatory Visit: Payer: Medicare HMO

## 2021-04-22 DIAGNOSIS — R058 Other specified cough: Secondary | ICD-10-CM | POA: Diagnosis not present

## 2021-04-22 DIAGNOSIS — R6883 Chills (without fever): Secondary | ICD-10-CM | POA: Diagnosis not present

## 2021-04-22 DIAGNOSIS — R630 Anorexia: Secondary | ICD-10-CM | POA: Diagnosis not present

## 2021-04-22 DIAGNOSIS — R0981 Nasal congestion: Secondary | ICD-10-CM | POA: Diagnosis not present

## 2021-04-23 DIAGNOSIS — U071 COVID-19: Secondary | ICD-10-CM | POA: Diagnosis not present

## 2021-04-23 DIAGNOSIS — Z20828 Contact with and (suspected) exposure to other viral communicable diseases: Secondary | ICD-10-CM | POA: Diagnosis not present

## 2021-04-29 DIAGNOSIS — C189 Malignant neoplasm of colon, unspecified: Secondary | ICD-10-CM | POA: Diagnosis not present

## 2021-04-29 DIAGNOSIS — E559 Vitamin D deficiency, unspecified: Secondary | ICD-10-CM | POA: Diagnosis not present

## 2021-04-29 DIAGNOSIS — E119 Type 2 diabetes mellitus without complications: Secondary | ICD-10-CM | POA: Diagnosis not present

## 2021-04-29 DIAGNOSIS — I700 Atherosclerosis of aorta: Secondary | ICD-10-CM | POA: Diagnosis not present

## 2021-04-29 DIAGNOSIS — Z743 Need for continuous supervision: Secondary | ICD-10-CM | POA: Diagnosis not present

## 2021-04-29 DIAGNOSIS — A419 Sepsis, unspecified organism: Secondary | ICD-10-CM | POA: Diagnosis not present

## 2021-04-29 DIAGNOSIS — I509 Heart failure, unspecified: Secondary | ICD-10-CM | POA: Diagnosis not present

## 2021-04-29 DIAGNOSIS — N179 Acute kidney failure, unspecified: Secondary | ICD-10-CM | POA: Diagnosis not present

## 2021-04-29 DIAGNOSIS — L989 Disorder of the skin and subcutaneous tissue, unspecified: Secondary | ICD-10-CM | POA: Diagnosis not present

## 2021-04-29 DIAGNOSIS — J159 Unspecified bacterial pneumonia: Secondary | ICD-10-CM | POA: Diagnosis not present

## 2021-04-29 DIAGNOSIS — R059 Cough, unspecified: Secondary | ICD-10-CM | POA: Diagnosis not present

## 2021-04-29 DIAGNOSIS — N189 Chronic kidney disease, unspecified: Secondary | ICD-10-CM | POA: Diagnosis not present

## 2021-04-29 DIAGNOSIS — R1084 Generalized abdominal pain: Secondary | ICD-10-CM | POA: Diagnosis not present

## 2021-04-29 DIAGNOSIS — R110 Nausea: Secondary | ICD-10-CM | POA: Diagnosis not present

## 2021-04-29 DIAGNOSIS — E785 Hyperlipidemia, unspecified: Secondary | ICD-10-CM | POA: Diagnosis not present

## 2021-04-29 DIAGNOSIS — R6511 Systemic inflammatory response syndrome (SIRS) of non-infectious origin with acute organ dysfunction: Secondary | ICD-10-CM | POA: Diagnosis not present

## 2021-04-29 DIAGNOSIS — E038 Other specified hypothyroidism: Secondary | ICD-10-CM | POA: Diagnosis not present

## 2021-04-29 DIAGNOSIS — Z20822 Contact with and (suspected) exposure to covid-19: Secondary | ICD-10-CM | POA: Diagnosis not present

## 2021-04-29 DIAGNOSIS — J189 Pneumonia, unspecified organism: Secondary | ICD-10-CM | POA: Diagnosis not present

## 2021-04-29 DIAGNOSIS — M109 Gout, unspecified: Secondary | ICD-10-CM | POA: Diagnosis not present

## 2021-04-29 DIAGNOSIS — M159 Polyosteoarthritis, unspecified: Secondary | ICD-10-CM | POA: Diagnosis not present

## 2021-04-29 DIAGNOSIS — R531 Weakness: Secondary | ICD-10-CM | POA: Diagnosis not present

## 2021-04-29 DIAGNOSIS — K5792 Diverticulitis of intestine, part unspecified, without perforation or abscess without bleeding: Secondary | ICD-10-CM | POA: Diagnosis not present

## 2021-04-29 DIAGNOSIS — I959 Hypotension, unspecified: Secondary | ICD-10-CM | POA: Diagnosis not present

## 2021-04-29 DIAGNOSIS — G319 Degenerative disease of nervous system, unspecified: Secondary | ICD-10-CM | POA: Diagnosis not present

## 2021-04-29 DIAGNOSIS — E872 Acidosis, unspecified: Secondary | ICD-10-CM | POA: Diagnosis not present

## 2021-04-29 DIAGNOSIS — I130 Hypertensive heart and chronic kidney disease with heart failure and stage 1 through stage 4 chronic kidney disease, or unspecified chronic kidney disease: Secondary | ICD-10-CM | POA: Diagnosis not present

## 2021-04-29 DIAGNOSIS — K5900 Constipation, unspecified: Secondary | ICD-10-CM | POA: Diagnosis not present

## 2021-04-29 DIAGNOSIS — D518 Other vitamin B12 deficiency anemias: Secondary | ICD-10-CM | POA: Diagnosis not present

## 2021-04-29 DIAGNOSIS — Z7901 Long term (current) use of anticoagulants: Secondary | ICD-10-CM | POA: Diagnosis not present

## 2021-04-29 DIAGNOSIS — K5732 Diverticulitis of large intestine without perforation or abscess without bleeding: Secondary | ICD-10-CM | POA: Diagnosis not present

## 2021-04-29 DIAGNOSIS — Z66 Do not resuscitate: Secondary | ICD-10-CM | POA: Diagnosis not present

## 2021-04-29 DIAGNOSIS — I4891 Unspecified atrial fibrillation: Secondary | ICD-10-CM | POA: Diagnosis not present

## 2021-04-29 DIAGNOSIS — J9601 Acute respiratory failure with hypoxia: Secondary | ICD-10-CM | POA: Diagnosis not present

## 2021-04-29 DIAGNOSIS — R109 Unspecified abdominal pain: Secondary | ICD-10-CM | POA: Diagnosis not present

## 2021-04-30 DIAGNOSIS — K5900 Constipation, unspecified: Secondary | ICD-10-CM | POA: Diagnosis not present

## 2021-04-30 DIAGNOSIS — I4891 Unspecified atrial fibrillation: Secondary | ICD-10-CM | POA: Diagnosis not present

## 2021-04-30 DIAGNOSIS — J9601 Acute respiratory failure with hypoxia: Secondary | ICD-10-CM | POA: Diagnosis not present

## 2021-04-30 DIAGNOSIS — M109 Gout, unspecified: Secondary | ICD-10-CM | POA: Diagnosis not present

## 2021-04-30 DIAGNOSIS — N179 Acute kidney failure, unspecified: Secondary | ICD-10-CM | POA: Diagnosis not present

## 2021-04-30 DIAGNOSIS — Z7901 Long term (current) use of anticoagulants: Secondary | ICD-10-CM | POA: Diagnosis not present

## 2021-04-30 DIAGNOSIS — J189 Pneumonia, unspecified organism: Secondary | ICD-10-CM | POA: Diagnosis not present

## 2021-04-30 DIAGNOSIS — K5732 Diverticulitis of large intestine without perforation or abscess without bleeding: Secondary | ICD-10-CM | POA: Diagnosis not present

## 2021-04-30 DIAGNOSIS — L989 Disorder of the skin and subcutaneous tissue, unspecified: Secondary | ICD-10-CM | POA: Diagnosis not present

## 2021-05-01 DIAGNOSIS — R6511 Systemic inflammatory response syndrome (SIRS) of non-infectious origin with acute organ dysfunction: Secondary | ICD-10-CM | POA: Diagnosis not present

## 2021-05-01 DIAGNOSIS — E872 Acidosis, unspecified: Secondary | ICD-10-CM | POA: Diagnosis not present

## 2021-05-01 DIAGNOSIS — J9601 Acute respiratory failure with hypoxia: Secondary | ICD-10-CM | POA: Diagnosis not present

## 2021-05-01 DIAGNOSIS — J189 Pneumonia, unspecified organism: Secondary | ICD-10-CM | POA: Diagnosis not present

## 2021-05-01 DIAGNOSIS — L989 Disorder of the skin and subcutaneous tissue, unspecified: Secondary | ICD-10-CM | POA: Diagnosis not present

## 2021-05-01 DIAGNOSIS — N179 Acute kidney failure, unspecified: Secondary | ICD-10-CM | POA: Diagnosis not present

## 2021-05-01 DIAGNOSIS — K5900 Constipation, unspecified: Secondary | ICD-10-CM | POA: Diagnosis not present

## 2021-05-01 DIAGNOSIS — K5732 Diverticulitis of large intestine without perforation or abscess without bleeding: Secondary | ICD-10-CM | POA: Diagnosis not present

## 2021-05-01 DIAGNOSIS — A419 Sepsis, unspecified organism: Secondary | ICD-10-CM | POA: Diagnosis not present

## 2021-05-02 DIAGNOSIS — L989 Disorder of the skin and subcutaneous tissue, unspecified: Secondary | ICD-10-CM | POA: Diagnosis not present

## 2021-05-02 DIAGNOSIS — J9601 Acute respiratory failure with hypoxia: Secondary | ICD-10-CM | POA: Diagnosis not present

## 2021-05-02 DIAGNOSIS — K5732 Diverticulitis of large intestine without perforation or abscess without bleeding: Secondary | ICD-10-CM | POA: Diagnosis not present

## 2021-05-02 DIAGNOSIS — N179 Acute kidney failure, unspecified: Secondary | ICD-10-CM | POA: Diagnosis not present

## 2021-05-02 DIAGNOSIS — A419 Sepsis, unspecified organism: Secondary | ICD-10-CM | POA: Diagnosis not present

## 2021-05-02 DIAGNOSIS — E872 Acidosis, unspecified: Secondary | ICD-10-CM | POA: Diagnosis not present

## 2021-05-02 DIAGNOSIS — J189 Pneumonia, unspecified organism: Secondary | ICD-10-CM | POA: Diagnosis not present

## 2021-05-02 DIAGNOSIS — R6511 Systemic inflammatory response syndrome (SIRS) of non-infectious origin with acute organ dysfunction: Secondary | ICD-10-CM | POA: Diagnosis not present

## 2021-05-02 DIAGNOSIS — K5900 Constipation, unspecified: Secondary | ICD-10-CM | POA: Diagnosis not present

## 2021-05-03 DIAGNOSIS — Z743 Need for continuous supervision: Secondary | ICD-10-CM | POA: Diagnosis not present

## 2021-05-03 DIAGNOSIS — A419 Sepsis, unspecified organism: Secondary | ICD-10-CM | POA: Diagnosis not present

## 2021-05-03 DIAGNOSIS — E872 Acidosis, unspecified: Secondary | ICD-10-CM | POA: Diagnosis not present

## 2021-05-03 DIAGNOSIS — N189 Chronic kidney disease, unspecified: Secondary | ICD-10-CM | POA: Diagnosis not present

## 2021-05-03 DIAGNOSIS — N179 Acute kidney failure, unspecified: Secondary | ICD-10-CM | POA: Diagnosis not present

## 2021-05-03 DIAGNOSIS — K5732 Diverticulitis of large intestine without perforation or abscess without bleeding: Secondary | ICD-10-CM | POA: Diagnosis not present

## 2021-05-03 DIAGNOSIS — J9601 Acute respiratory failure with hypoxia: Secondary | ICD-10-CM | POA: Diagnosis not present

## 2021-05-03 DIAGNOSIS — R531 Weakness: Secondary | ICD-10-CM | POA: Diagnosis not present

## 2021-05-03 DIAGNOSIS — J189 Pneumonia, unspecified organism: Secondary | ICD-10-CM | POA: Diagnosis not present

## 2021-05-03 DIAGNOSIS — L989 Disorder of the skin and subcutaneous tissue, unspecified: Secondary | ICD-10-CM | POA: Diagnosis not present

## 2021-05-09 DIAGNOSIS — K5792 Diverticulitis of intestine, part unspecified, without perforation or abscess without bleeding: Secondary | ICD-10-CM | POA: Diagnosis not present

## 2021-05-09 DIAGNOSIS — R5383 Other fatigue: Secondary | ICD-10-CM | POA: Diagnosis not present

## 2021-05-09 DIAGNOSIS — N189 Chronic kidney disease, unspecified: Secondary | ICD-10-CM | POA: Diagnosis not present

## 2021-05-09 DIAGNOSIS — M6281 Muscle weakness (generalized): Secondary | ICD-10-CM | POA: Diagnosis not present

## 2021-05-09 DIAGNOSIS — J189 Pneumonia, unspecified organism: Secondary | ICD-10-CM | POA: Diagnosis not present

## 2021-05-10 DIAGNOSIS — Z79899 Other long term (current) drug therapy: Secondary | ICD-10-CM | POA: Diagnosis not present

## 2021-05-10 DIAGNOSIS — D518 Other vitamin B12 deficiency anemias: Secondary | ICD-10-CM | POA: Diagnosis not present

## 2021-05-10 DIAGNOSIS — E7849 Other hyperlipidemia: Secondary | ICD-10-CM | POA: Diagnosis not present

## 2021-05-10 DIAGNOSIS — E038 Other specified hypothyroidism: Secondary | ICD-10-CM | POA: Diagnosis not present

## 2021-05-10 DIAGNOSIS — E119 Type 2 diabetes mellitus without complications: Secondary | ICD-10-CM | POA: Diagnosis not present

## 2021-05-10 DIAGNOSIS — E559 Vitamin D deficiency, unspecified: Secondary | ICD-10-CM | POA: Diagnosis not present

## 2021-05-20 DIAGNOSIS — I509 Heart failure, unspecified: Secondary | ICD-10-CM | POA: Diagnosis not present

## 2021-05-20 DIAGNOSIS — E119 Type 2 diabetes mellitus without complications: Secondary | ICD-10-CM | POA: Diagnosis not present

## 2021-05-20 DIAGNOSIS — D518 Other vitamin B12 deficiency anemias: Secondary | ICD-10-CM | POA: Diagnosis not present

## 2021-05-20 DIAGNOSIS — C189 Malignant neoplasm of colon, unspecified: Secondary | ICD-10-CM | POA: Diagnosis not present

## 2021-05-20 DIAGNOSIS — I4891 Unspecified atrial fibrillation: Secondary | ICD-10-CM | POA: Diagnosis not present

## 2021-05-20 DIAGNOSIS — E559 Vitamin D deficiency, unspecified: Secondary | ICD-10-CM | POA: Diagnosis not present

## 2021-05-20 DIAGNOSIS — M159 Polyosteoarthritis, unspecified: Secondary | ICD-10-CM | POA: Diagnosis not present

## 2021-05-20 DIAGNOSIS — E038 Other specified hypothyroidism: Secondary | ICD-10-CM | POA: Diagnosis not present

## 2021-05-20 DIAGNOSIS — E785 Hyperlipidemia, unspecified: Secondary | ICD-10-CM | POA: Diagnosis not present

## 2021-05-27 ENCOUNTER — Telehealth: Payer: Self-pay | Admitting: Hospice

## 2021-05-27 ENCOUNTER — Other Ambulatory Visit: Payer: Self-pay

## 2021-05-27 DIAGNOSIS — E559 Vitamin D deficiency, unspecified: Secondary | ICD-10-CM | POA: Diagnosis not present

## 2021-05-27 DIAGNOSIS — E538 Deficiency of other specified B group vitamins: Secondary | ICD-10-CM | POA: Diagnosis not present

## 2021-05-27 DIAGNOSIS — R5383 Other fatigue: Secondary | ICD-10-CM | POA: Diagnosis not present

## 2021-05-27 DIAGNOSIS — D649 Anemia, unspecified: Secondary | ICD-10-CM | POA: Diagnosis not present

## 2021-05-27 DIAGNOSIS — C186 Malignant neoplasm of descending colon: Secondary | ICD-10-CM

## 2021-05-27 NOTE — Telephone Encounter (Signed)
Np called to check in with patient. Nathaneil Canary reported patient is no longer at home but is now at  [employer] [facility] facility in [facility].

## 2021-05-28 ENCOUNTER — Inpatient Hospital Stay: Payer: Medicare HMO | Attending: Hematology

## 2021-05-28 ENCOUNTER — Other Ambulatory Visit: Payer: Self-pay

## 2021-05-28 ENCOUNTER — Ambulatory Visit (HOSPITAL_COMMUNITY)
Admission: RE | Admit: 2021-05-28 | Discharge: 2021-05-28 | Disposition: A | Discharge status: Home / Self Care | Payer: Medicare HMO | Source: Ambulatory Visit | Attending: Hematology | Admitting: Hematology

## 2021-05-28 ENCOUNTER — Encounter (HOSPITAL_COMMUNITY): Payer: Self-pay

## 2021-05-28 DIAGNOSIS — Z85038 Personal history of other malignant neoplasm of large intestine: Secondary | ICD-10-CM | POA: Insufficient documentation

## 2021-05-28 DIAGNOSIS — C186 Malignant neoplasm of descending colon: Secondary | ICD-10-CM | POA: Insufficient documentation

## 2021-05-28 DIAGNOSIS — S2242XD Multiple fractures of ribs, left side, subsequent encounter for fracture with routine healing: Secondary | ICD-10-CM | POA: Diagnosis not present

## 2021-05-28 DIAGNOSIS — K6389 Other specified diseases of intestine: Secondary | ICD-10-CM | POA: Diagnosis not present

## 2021-05-28 DIAGNOSIS — N200 Calculus of kidney: Secondary | ICD-10-CM | POA: Diagnosis not present

## 2021-05-28 DIAGNOSIS — K5730 Diverticulosis of large intestine without perforation or abscess without bleeding: Secondary | ICD-10-CM | POA: Diagnosis not present

## 2021-05-28 LAB — CMP (CANCER CENTER ONLY)
ALT: <5 U/L (ref 0–44)
AST: 9 U/L — ABNORMAL LOW (ref 15–41)
Albumin: 3.5 g/dL (ref 3.5–5.0)
Alkaline Phosphatase: 117 U/L (ref 38–126)
Anion gap: 8 (ref 5–15)
BUN: 28 mg/dL — ABNORMAL HIGH (ref 8–23)
CO2: 31 mmol/L (ref 22–32)
Calcium: 10 mg/dL (ref 8.9–10.3)
Chloride: 102 mmol/L (ref 98–111)
Creatinine: 2.36 mg/dL — ABNORMAL HIGH (ref 0.61–1.24)
GFR, Estimated: 28 mL/min — ABNORMAL LOW (ref >60)
Glucose, Bld: 103 mg/dL — ABNORMAL HIGH (ref 70–99)
Potassium: 3.8 mmol/L (ref 3.5–5.1)
Sodium: 141 mmol/L (ref 135–145)
Total Bilirubin: 0.9 mg/dL (ref 0.3–1.2)
Total Protein: 7.5 g/dL (ref 6.5–8.1)

## 2021-05-28 LAB — CBC WITH DIFFERENTIAL (CANCER CENTER ONLY)
Abs Immature Granulocytes: 0.02 10*3/uL (ref 0.00–0.07)
Basophils Absolute: 0 10*3/uL (ref 0.0–0.1)
Basophils Relative: 1 %
Eosinophils Absolute: 0.5 10*3/uL (ref 0.0–0.5)
Eosinophils Relative: 7 %
HCT: 34.2 % — ABNORMAL LOW (ref 39.0–52.0)
Hemoglobin: 10.5 g/dL — ABNORMAL LOW (ref 13.0–17.0)
Immature Granulocytes: 0 %
Lymphocytes Relative: 20 %
Lymphs Abs: 1.6 10*3/uL (ref 0.7–4.0)
MCH: 29.3 pg (ref 26.0–34.0)
MCHC: 30.7 g/dL (ref 30.0–36.0)
MCV: 95.5 fL (ref 80.0–100.0)
Monocytes Absolute: 0.7 10*3/uL (ref 0.1–1.0)
Monocytes Relative: 9 %
Neutro Abs: 5 10*3/uL (ref 1.7–7.7)
Neutrophils Relative %: 63 %
Platelet Count: 295 10*3/uL (ref 150–400)
RBC: 3.58 MIL/uL — ABNORMAL LOW (ref 4.22–5.81)
RDW: 16.4 % — ABNORMAL HIGH (ref 11.5–15.5)
WBC Count: 7.8 10*3/uL (ref 4.0–10.5)
nRBC: 0 % (ref 0.0–0.2)

## 2021-05-28 LAB — IRON AND IRON BINDING CAPACITY (CC-WL,HP ONLY)
Iron: 38 ug/dL — ABNORMAL LOW (ref 45–182)
Saturation Ratios: 11 % — ABNORMAL LOW (ref 17.9–39.5)
TIBC: 342 ug/dL (ref 250–450)
UIBC: 304 ug/dL (ref 117–376)

## 2021-05-30 ENCOUNTER — Inpatient Hospital Stay: Payer: Medicare HMO | Attending: Hematology | Admitting: Hematology

## 2021-05-30 ENCOUNTER — Encounter: Payer: Self-pay | Admitting: Hematology

## 2021-05-30 ENCOUNTER — Other Ambulatory Visit: Payer: Self-pay

## 2021-05-30 VITALS — BP 135/66 | HR 64 | Temp 97.7°F | Resp 17

## 2021-05-30 DIAGNOSIS — C186 Malignant neoplasm of descending colon: Secondary | ICD-10-CM

## 2021-05-30 DIAGNOSIS — N184 Chronic kidney disease, stage 4 (severe): Secondary | ICD-10-CM | POA: Insufficient documentation

## 2021-05-30 DIAGNOSIS — Z85038 Personal history of other malignant neoplasm of large intestine: Secondary | ICD-10-CM | POA: Insufficient documentation

## 2021-05-30 DIAGNOSIS — I482 Chronic atrial fibrillation, unspecified: Secondary | ICD-10-CM | POA: Diagnosis not present

## 2021-05-30 DIAGNOSIS — D649 Anemia, unspecified: Secondary | ICD-10-CM | POA: Diagnosis not present

## 2021-05-30 DIAGNOSIS — I129 Hypertensive chronic kidney disease with stage 1 through stage 4 chronic kidney disease, or unspecified chronic kidney disease: Secondary | ICD-10-CM | POA: Diagnosis not present

## 2021-05-30 DIAGNOSIS — Z9049 Acquired absence of other specified parts of digestive tract: Secondary | ICD-10-CM | POA: Insufficient documentation

## 2021-05-30 DIAGNOSIS — M25569 Pain in unspecified knee: Secondary | ICD-10-CM | POA: Diagnosis not present

## 2021-05-30 NOTE — Progress Notes (Signed)
[name]   Telephone:(336) [phone] Fax:(336) [phone]   Clinic Follow up Note   Patient Care Team: [name], Bradley Salinas, MD as PCP - General (Family Medicine) Bradley Pain, MD as PCP - Cardiology (Cardiology)  Date of Service:  05/30/2021  CHIEF COMPLAINT: f/u of colon cancer  CURRENT THERAPY:  Surveillance  ASSESSMENT & PLAN:  Bradley Stokes is a 76 y.o. male with   1. Cancer of left colon, pT3N1M0 stage IIIB -Diagnosed in 11/2020 -he had right colectomy on 12/08/20 with Dr. Kieth Stokes. Pathology showed 5 cm tumor, margins not involved, one of 23 lymph nodes involved, and five tubular adenomas -liver MRI on 12/10/20 showed two probable liver cysts, not considered suspicious for metastatic disease. -due to his poor performance status, and stage IV kidney disease, he was not a candidate for adjuvant chemo. He is still in rehab, overall condition has improved some.   -surveillance CT CAP on 05/28/21 showed postoperative changes, no convincing evidence of residual/metastatic disease. I reviewed the results with him. -I discussed that he will be due for surveillance colonoscopy in 11/2021. He is reluctant to consider given the difficulty with the prep. -he is clinically doing well from a cancer standpoint. Lab from 05/28/21 reviewed, overall stable. -Follow-up in 3 months, plan to repeat CT again in 6 months    2. HTN, AF, CKD stage IV -F/u with PCP -CKD slightly worse since his surgery.   3. Anemia  -Secondary to recent surgery, colon cancer, and chronic kidney disease -overall mild and stable, hgb 10.5 on 05/28/21 -continue oral iron    4. Social support -he previously lived with his sister. They are now both in the same skilled rehab facility for different reasons.  5. Knee Stokes  -f/u with NP at SNF -he is not able to walk much due to the Stokes      PLAN: -lab and f/u with NP in 3 months   No problem-specific Assessment & Plan notes found for this  encounter.   SUMMARY OF ONCOLOGIC HISTORY: Oncology History Overview Note  Cancer Staging Cancer of left colon Bradley Stokes) Staging form: Colon and Rectum, AJCC 8th Edition - Pathologic stage from 12/08/2020: Stage IIIB (pT3, pN1a, cM0) - Signed by Bradley Merle, MD on 12/11/2020    Cancer of left colon (Bradley Stokes)  12/05/2020 Procedure   Colonoscopy, under Dr. Collene Stokes  Impression: - Preparation of the colon was extremely poor inspite of aggressive lavage. - Diverticulosis in the sigmoid colon. - Likely malignant 5 cm tumor in the proximal descending colon-biopsied; mucosa 5 cm distal to the mass was injected with 3 cc's of [place] ink. - One 10 mm polyp in the cecum, removed with a hot snare x 1; resected and retrieved. - Moderate sized internal hemorrhoids.   12/05/2020 Pathology Results   FINAL MICROSCOPIC DIAGNOSIS:   A. COLON, CECAL, POLYPECTOMY:  - Tubular adenoma  - Negative for high-grade dysplasia or malignancy   B. COLON MASS, DESCENDING, BIOPSY:  - Invasive moderately differentiated adenocarcinoma, see comment    ADDENDUM: Mismatch Repair Protein (IHC)  SUMMARY INTERPRETATION: NORMAL    12/06/2020 Imaging   CT CAP  IMPRESSION: 1. Short segment of colonic wall thickening involving the splenic flexure likely reflects patient's presumed colonic malignancy. 2. Single 1.3 cm lymph node posterior to the colonic lesion likely reflecting local nodal involvement. No additional pathologically enlarged abdominal or pelvic lymph nodes to suggest distant nodal metastases. 3. Two subcentimeter hypodense hepatic lesions, incompletely characterized on this examination. Possibly  reflecting benign hepatic cysts but cystic hepatic metastases not excluded on this study. Consider attention on close interval follow-up imaging versus further evaluation with hepatic protocol MRI with and without contrast, preferably as an outpatient when patient is stable and able to follow commands. 4. Healing nonpathologic  left lateral eighth and ninth rib fractures. 5. Aortic Atherosclerosis (ICD10-I70.0).   12/08/2020 Cancer Staging   Staging form: Colon and Rectum, AJCC 8th Edition - Pathologic stage from 12/08/2020: Stage IIIB (pT3, pN1a, cM0) - Signed by Bradley Merle, MD on 12/11/2020 Stage prefix: Initial diagnosis Total positive nodes: 1 Histologic grading system: 4 grade system Histologic grade (G): G2 Residual tumor (R): R0 - None    12/08/2020 Pathology Results   FINAL MICROSCOPIC DIAGNOSIS:   A. COLON, RIGHT TRANSVERSE, COLECTOMY:  - Invasive moderately differentiated colorectal adenocarcinoma, 5 cm.  - Margins not involved.  - Metastatic carcinoma in one of twenty-three lymph nodes (1/23).  - Five tubular adenomas.  - Benign appendix.  - See oncology table.   ADDENDUM:  Mismatch Repair Protein (IHC)  SUMMARY INTERPRETATION: NORMAL    12/10/2020 Initial Diagnosis   Cancer of left colon (Bradley Stokes)   05/28/2021 Imaging   EXAM: CT CHEST, ABDOMEN AND PELVIS WITHOUT CONTRAST  IMPRESSION: 1. Prior right hemicolectomy with ileocolonic anastomosis. Prominent lymph nodes and stranding in the mesentery extending to the ileocolonic anastomosis in the left upper quadrant, likely reflecting postoperative change/inflammation. Close attention on follow-up studies is suggested. 2. No convincing evidence of residual/metastatic disease in the chest, abdomen or pelvis. 3. Short segment colonic wall thickening of a portion of decompressed sigmoid colon is new from prior and favored to reflect underdistention. Attention on follow-up studies is recommended. 4. Punctate nonobstructive right lower pole renal stone. 5.  Aortic Atherosclerosis (ICD10-I70.0).      INTERVAL HISTORY:  Bradley Stokes is here for a follow up of colon cancer. He was last seen by me on 02/26/21. He presents to the clinic accompanied by Bradley Stokes, an assistant from his SNF. He is still in rehab, he is not able to walk much due to his  significant knee pains, reports he is trying to stay active with physical therapy at the facility.  He needs assistance from his ADLs, including moving from bed to chair.  He denies any significant abdominal discomfort, nausea, or other new complaints.   All other systems were reviewed with the patient and are negative.  MEDICAL HISTORY:  Past Medical History:  Diagnosis Date   Arthritis    severe arthritis and deformity of his knee's   Chest Stokes    Chronic atrial fibrillation (HCC)    Chronic renal disease, stage III (HCC)    Chronic venous stasis dermatitis    colon ca 10/2020   Edema    Gout    History of cardiovascular stress test 01/17/2008   EF- 62%   Hypercholesterolemia    Hyperlipidemia    Hypertension    Kidney stones    Long term (current) use of anticoagulants    Long-term (current) use of anticoagulants    Obesity    Osteoarthritis    PAF (paroxysmal atrial fibrillation) (HCC)    Paroxysmal atrial fibrillation ([place])    Secondary hypercoagulable state ([place])     SURGICAL HISTORY: Past Surgical History:  Procedure Laterality Date   BIOPSY  12/05/2020   Procedure: BIOPSY;  Surgeon: [name], MD;  Location: [facility] ENDOSCOPY;  Service: Endoscopy;;   CARDIOLITE STRESS TEST     EF 62%  COLONOSCOPY N/A 12/05/2020   Procedure: COLONOSCOPY;  Surgeon: [name], MD;  Location[facility] ENDOSCOPY;  Service: Endoscopy;  Laterality: N/A;   ESOPHAGOGASTRODUODENOSCOPY (EGD) WITH PROPOFOL N/A 12/05/2020   Procedure: ESOPHAGOGASTRODUODENOSCOPY (EGD) WITH PROPOFOL;  Surgeon: [name], MD;  Location: [facility] ENDOSCOPY;  Service: Endoscopy;  Laterality: N/A;   LAPAROSCOPIC LOW ANTERIOR RESCECTION WITH COLOANAL ANASTOMOSIS  12/08/2020   Procedure: LAPAROSCOPIC LOW ANTERIOR RESCECTION WITH COLOANAL ANASTOMOSIS;  Surgeon: Bradley Stokes, Arta Bruce, MD;  Location: [place];  Service: General;;   LAPAROSCOPIC PARTIAL COLECTOMY N/A 12/08/2020   Procedure: LAPAROSCOPIC ASSISTED POSSIBLE OPEN PARTIAL  COLECTOMY WITH POSSIBLE OSTOMY;  Surgeon: Bradley Stokes, Arta Bruce, MD;  Location: [place];  Service: General;  Laterality: N/A;   POLYPECTOMY  12/05/2020   Procedure: POLYPECTOMY;  Surgeon: [name], MD;  Location[facility] ENDOSCOPY;  Service: Endoscopy;;   SUBMUCOSAL TATTOO INJECTION  12/05/2020   Procedure: SUBMUCOSAL TATTOO INJECTION;  Surgeon: [name], MD;  Location: [facility] ENDOSCOPY;  Service: Endoscopy;;    I have reviewed the social history and family history with the patient and they are unchanged from previous note.  ALLERGIES:  is allergic to other.  MEDICATIONS:  Current Outpatient Medications  Medication Sig Dispense Refill   acetaminophen (TYLENOL) 325 MG tablet Take 325 mg by mouth every 6 (six) hours as needed (Stokes).      allopurinol (ZYLOPRIM) 100 MG tablet Take 100 mg by mouth daily.     amiodarone (PACERONE) 100 MG tablet Take 100 mg by mouth daily.     ammonium lactate (LAC-HYDRIN) 12 % lotion Apply 1 application topically 2 (two) times daily.     apixaban (ELIQUIS) 5 MG TABS tablet Take 1 tablet (5 mg total) by mouth 2 (two) times daily. 60 tablet    atorvastatin (LIPITOR) 20 MG tablet Take 20 mg by mouth Daily.     calcitRIOL (ROCALTROL) 0.5 MCG capsule Take 0.5 mcg daily by mouth.      furosemide (LASIX) 20 MG tablet TAKE 2 TABLETS BY MOUTH TWICE DAILY (Patient taking differently: Take 40 mg by mouth 2 (two) times daily.) 120 tablet 0   K Phos Mono-Sod Phos Di & Mono (VIRT-PHOS 250 NEUTRAL) [phone] MG TABS Take 1 tablet by mouth 2 (two) times a day.      Nutritional Supplements (,FEEDING SUPPLEMENT, PROSOURCE PLUS) liquid Take 30 mLs by mouth 3 (three) times daily between meals.     ondansetron (ZOFRAN) 4 MG tablet Take 4 mg by mouth daily.     pantoprazole (PROTONIX) 40 MG tablet Take 1 tablet (40 mg total) by mouth daily.     potassium chloride SA (KLOR-CON) 20 MEQ tablet TAKE 1 TABLET BY MOUTH DAILY (Patient taking differently: Take 20 mEq by mouth daily. TAKE 1 TABLET  BY MOUTH DAILY) 90 tablet 3   traMADol (ULTRAM) 50 MG tablet Take 1 tablet by mouth every 6 (six) hours as needed.     No current facility-administered medications for this visit.    PHYSICAL EXAMINATION: ECOG PERFORMANCE STATUS: 3 - Symptomatic, >50% confined to bed  Vitals:   05/30/21 1124  BP: 135/66  Pulse: 64  Resp: 17  Temp: 97.7 F (36.5 C)  SpO2: 100%   Wt Readings from Last 3 Encounters:  12/13/20 291 lb 10.7 oz (132.3 kg)  06/12/20 287 lb (130.2 kg)  12/21/19 298 lb 12.8 oz (135.5 kg)     GENERAL:alert, no distress and comfortable SKIN: skin color normal, no rashes or significant lesions EYES: normal, Conjunctiva are pink and  non-injected, sclera clear  NEURO: alert & oriented x 3 with fluent speech  LABORATORY DATA:  I have reviewed the data as listed CBC Latest Ref Rng & Units 05/28/2021 02/26/2021 01/16/2021  WBC 4.0 - 10.5 K/uL 7.8 8.8 9.8  Hemoglobin 13.0 - 17.0 g/dL 10.5(L) 10.4(L) 9.4(L)  Hematocrit 39.0 - 52.0 % 34.2(L) 35.1(L) 31.7(L)  Platelets 150 - 400 K/uL 295 330 737(H)     CMP Latest Ref Rng & Units 05/28/2021 02/26/2021 01/16/2021  Glucose 70 - 99 mg/dL 103(H) 98 103(H)  BUN 8 - 23 mg/dL 28(H) 26(H) 32(H)  Creatinine 0.61 - 1.24 mg/dL 2.36(H) 2.16(H) 2.71(H)  Sodium 135 - 145 mmol/L 141 140 144  Potassium 3.5 - 5.1 mmol/L 3.8 4.7 4.0  Chloride 98 - 111 mmol/L 102 103 100  CO2 22 - 32 mmol/L '31 31 29  ' Calcium 8.9 - 10.3 mg/dL 10.0 9.3 9.9  Total Protein 6.5 - 8.1 g/dL 7.5 7.1 7.6  Total Bilirubin 0.3 - 1.2 mg/dL 0.9 1.1 0.7  Alkaline Phos 38 - 126 U/L 117 101 88  AST 15 - 41 U/L 9(L) 14(L) 14(L)  ALT 0 - 44 U/L <'5 7 5      ' RADIOGRAPHIC STUDIES: I have personally reviewed the radiological images as listed and agreed with the findings in the report. No results found.    No orders of the defined types were placed in this encounter.  All questions were answered. The patient knows to call the clinic with any problems, questions or  concerns. No barriers to learning was detected. The total time spent in the appointment was 30 minutes.     Bradley Merle, MD 05/30/2021   I, Wilburn Mylar, am acting as scribe for Bradley Merle, MD.   I have reviewed the above documentation for accuracy and completeness, and I agree with the above.

## 2021-06-04 DIAGNOSIS — I4891 Unspecified atrial fibrillation: Secondary | ICD-10-CM | POA: Diagnosis not present

## 2021-06-04 DIAGNOSIS — I129 Hypertensive chronic kidney disease with stage 1 through stage 4 chronic kidney disease, or unspecified chronic kidney disease: Secondary | ICD-10-CM | POA: Diagnosis not present

## 2021-06-04 DIAGNOSIS — Z6841 Body Mass Index (BMI) 40.0 and over, adult: Secondary | ICD-10-CM | POA: Diagnosis not present

## 2021-06-04 DIAGNOSIS — N2581 Secondary hyperparathyroidism of renal origin: Secondary | ICD-10-CM | POA: Diagnosis not present

## 2021-06-04 DIAGNOSIS — D631 Anemia in chronic kidney disease: Secondary | ICD-10-CM | POA: Diagnosis not present

## 2021-06-04 DIAGNOSIS — E876 Hypokalemia: Secondary | ICD-10-CM | POA: Diagnosis not present

## 2021-06-04 DIAGNOSIS — E785 Hyperlipidemia, unspecified: Secondary | ICD-10-CM | POA: Diagnosis not present

## 2021-06-04 DIAGNOSIS — N189 Chronic kidney disease, unspecified: Secondary | ICD-10-CM | POA: Diagnosis not present

## 2021-06-04 DIAGNOSIS — N183 Chronic kidney disease, stage 3 unspecified: Secondary | ICD-10-CM | POA: Diagnosis not present

## 2021-06-05 DIAGNOSIS — Z79899 Other long term (current) drug therapy: Secondary | ICD-10-CM | POA: Diagnosis not present

## 2021-06-10 DIAGNOSIS — D518 Other vitamin B12 deficiency anemias: Secondary | ICD-10-CM | POA: Diagnosis not present

## 2021-06-10 DIAGNOSIS — E559 Vitamin D deficiency, unspecified: Secondary | ICD-10-CM | POA: Diagnosis not present

## 2021-06-10 DIAGNOSIS — M159 Polyosteoarthritis, unspecified: Secondary | ICD-10-CM | POA: Diagnosis not present

## 2021-06-10 DIAGNOSIS — C189 Malignant neoplasm of colon, unspecified: Secondary | ICD-10-CM | POA: Diagnosis not present

## 2021-06-10 DIAGNOSIS — E785 Hyperlipidemia, unspecified: Secondary | ICD-10-CM | POA: Diagnosis not present

## 2021-06-10 DIAGNOSIS — I509 Heart failure, unspecified: Secondary | ICD-10-CM | POA: Diagnosis not present

## 2021-06-10 DIAGNOSIS — I4891 Unspecified atrial fibrillation: Secondary | ICD-10-CM | POA: Diagnosis not present

## 2021-06-10 DIAGNOSIS — E038 Other specified hypothyroidism: Secondary | ICD-10-CM | POA: Diagnosis not present

## 2021-06-10 DIAGNOSIS — E119 Type 2 diabetes mellitus without complications: Secondary | ICD-10-CM | POA: Diagnosis not present

## 2021-06-13 DIAGNOSIS — K219 Gastro-esophageal reflux disease without esophagitis: Secondary | ICD-10-CM | POA: Diagnosis not present

## 2021-06-13 DIAGNOSIS — I872 Venous insufficiency (chronic) (peripheral): Secondary | ICD-10-CM | POA: Diagnosis not present

## 2021-06-13 DIAGNOSIS — M159 Polyosteoarthritis, unspecified: Secondary | ICD-10-CM | POA: Diagnosis not present

## 2021-06-13 DIAGNOSIS — I4891 Unspecified atrial fibrillation: Secondary | ICD-10-CM | POA: Diagnosis not present

## 2021-06-13 DIAGNOSIS — M109 Gout, unspecified: Secondary | ICD-10-CM | POA: Diagnosis not present

## 2021-06-13 DIAGNOSIS — M25461 Effusion, right knee: Secondary | ICD-10-CM | POA: Diagnosis not present

## 2021-06-13 DIAGNOSIS — E785 Hyperlipidemia, unspecified: Secondary | ICD-10-CM | POA: Diagnosis not present

## 2021-06-13 DIAGNOSIS — M6281 Muscle weakness (generalized): Secondary | ICD-10-CM | POA: Diagnosis not present

## 2021-06-13 DIAGNOSIS — I509 Heart failure, unspecified: Secondary | ICD-10-CM | POA: Diagnosis not present

## 2021-06-15 DIAGNOSIS — M25561 Pain in right knee: Secondary | ICD-10-CM | POA: Diagnosis not present

## 2021-06-20 DIAGNOSIS — S83249A Other tear of medial meniscus, current injury, unspecified knee, initial encounter: Secondary | ICD-10-CM | POA: Diagnosis not present

## 2021-06-20 DIAGNOSIS — M25461 Effusion, right knee: Secondary | ICD-10-CM | POA: Diagnosis not present

## 2021-06-25 DIAGNOSIS — I10 Essential (primary) hypertension: Secondary | ICD-10-CM | POA: Diagnosis not present

## 2021-06-28 DIAGNOSIS — K5792 Diverticulitis of intestine, part unspecified, without perforation or abscess without bleeding: Secondary | ICD-10-CM | POA: Diagnosis not present

## 2021-06-28 DIAGNOSIS — R944 Abnormal results of kidney function studies: Secondary | ICD-10-CM | POA: Diagnosis not present

## 2021-06-28 DIAGNOSIS — R110 Nausea: Secondary | ICD-10-CM | POA: Diagnosis not present

## 2021-06-28 DIAGNOSIS — K6389 Other specified diseases of intestine: Secondary | ICD-10-CM | POA: Diagnosis not present

## 2021-06-28 DIAGNOSIS — R1111 Vomiting without nausea: Secondary | ICD-10-CM | POA: Diagnosis not present

## 2021-06-28 DIAGNOSIS — R1084 Generalized abdominal pain: Secondary | ICD-10-CM | POA: Diagnosis not present

## 2021-06-28 DIAGNOSIS — N200 Calculus of kidney: Secondary | ICD-10-CM | POA: Diagnosis not present

## 2021-06-28 DIAGNOSIS — Z7401 Bed confinement status: Secondary | ICD-10-CM | POA: Diagnosis not present

## 2021-06-28 DIAGNOSIS — I700 Atherosclerosis of aorta: Secondary | ICD-10-CM | POA: Diagnosis not present

## 2021-06-28 DIAGNOSIS — R531 Weakness: Secondary | ICD-10-CM | POA: Diagnosis not present

## 2021-06-28 DIAGNOSIS — N281 Cyst of kidney, acquired: Secondary | ICD-10-CM | POA: Diagnosis not present

## 2021-06-28 DIAGNOSIS — K5732 Diverticulitis of large intestine without perforation or abscess without bleeding: Secondary | ICD-10-CM | POA: Diagnosis not present

## 2021-06-28 DIAGNOSIS — R1030 Lower abdominal pain, unspecified: Secondary | ICD-10-CM | POA: Diagnosis not present

## 2021-06-28 DIAGNOSIS — K5730 Diverticulosis of large intestine without perforation or abscess without bleeding: Secondary | ICD-10-CM | POA: Diagnosis not present

## 2021-06-28 DIAGNOSIS — D649 Anemia, unspecified: Secondary | ICD-10-CM | POA: Diagnosis not present

## 2021-07-13 DIAGNOSIS — E038 Other specified hypothyroidism: Secondary | ICD-10-CM | POA: Diagnosis not present

## 2021-07-13 DIAGNOSIS — C189 Malignant neoplasm of colon, unspecified: Secondary | ICD-10-CM | POA: Diagnosis not present

## 2021-07-13 DIAGNOSIS — E559 Vitamin D deficiency, unspecified: Secondary | ICD-10-CM | POA: Diagnosis not present

## 2021-07-13 DIAGNOSIS — I509 Heart failure, unspecified: Secondary | ICD-10-CM | POA: Diagnosis not present

## 2021-07-13 DIAGNOSIS — E119 Type 2 diabetes mellitus without complications: Secondary | ICD-10-CM | POA: Diagnosis not present

## 2021-07-13 DIAGNOSIS — M159 Polyosteoarthritis, unspecified: Secondary | ICD-10-CM | POA: Diagnosis not present

## 2021-07-13 DIAGNOSIS — I4891 Unspecified atrial fibrillation: Secondary | ICD-10-CM | POA: Diagnosis not present

## 2021-07-13 DIAGNOSIS — D518 Other vitamin B12 deficiency anemias: Secondary | ICD-10-CM | POA: Diagnosis not present

## 2021-07-13 DIAGNOSIS — E785 Hyperlipidemia, unspecified: Secondary | ICD-10-CM | POA: Diagnosis not present

## 2021-07-16 DIAGNOSIS — M25561 Pain in right knee: Secondary | ICD-10-CM | POA: Diagnosis not present

## 2021-07-16 DIAGNOSIS — M1711 Unilateral primary osteoarthritis, right knee: Secondary | ICD-10-CM | POA: Diagnosis not present

## 2021-07-18 DIAGNOSIS — M1711 Unilateral primary osteoarthritis, right knee: Secondary | ICD-10-CM | POA: Diagnosis not present

## 2021-07-26 DIAGNOSIS — I10 Essential (primary) hypertension: Secondary | ICD-10-CM | POA: Diagnosis not present

## 2021-08-25 NOTE — Progress Notes (Deleted)
Bradley Stokes   Telephone:(336) [phone] Fax:(336) [phone]   Clinic Follow up Note   Patient Care Team: [name], [name], MD as PCP - General (Family Medicine) Jerline Pain, MD as PCP - Cardiology (Cardiology) 08/25/2021  CHIEF COMPLAINT: Follow up colon cancer   SUMMARY OF ONCOLOGIC HISTORY: Oncology History Overview Note  Cancer Staging Cancer of left colon [facility]) Staging form: Colon and Rectum, AJCC 8th Edition - Pathologic stage from 12/08/2020: Stage IIIB (pT3, pN1a, cM0) - Signed by Truitt Merle, MD on 12/11/2020    Cancer of left colon ([place])  12/05/2020 Procedure   Colonoscopy, under Dr. Collene Mares  Impression: - Preparation of the colon was extremely poor inspite of aggressive lavage. - Diverticulosis in the sigmoid colon. - Likely malignant 5 cm tumor in the proximal descending colon-biopsied; mucosa 5 cm distal to the mass was injected with 3 cc's of Niger ink. - One 10 mm polyp in the cecum, removed with a hot snare x 1; resected and retrieved. - Moderate sized internal hemorrhoids.   12/05/2020 Pathology Results   FINAL MICROSCOPIC DIAGNOSIS:   A. COLON, CECAL, POLYPECTOMY:  - Tubular adenoma  - Negative for high-grade dysplasia or malignancy   B. COLON MASS, DESCENDING, BIOPSY:  - Invasive moderately differentiated adenocarcinoma, see comment    ADDENDUM: Mismatch Repair Protein (IHC)  SUMMARY INTERPRETATION: NORMAL    12/06/2020 Imaging   CT CAP  IMPRESSION: 1. Short segment of colonic wall thickening involving the splenic flexure likely reflects patient's presumed colonic malignancy. 2. Single 1.3 cm lymph node posterior to the colonic lesion likely reflecting local nodal involvement. No additional pathologically enlarged abdominal or pelvic lymph nodes to suggest distant nodal metastases. 3. Two subcentimeter hypodense hepatic lesions, incompletely characterized on this examination. Possibly reflecting benign hepatic cysts but cystic hepatic  metastases not excluded on this study. Consider attention on close interval follow-up imaging versus further evaluation with hepatic protocol MRI with and without contrast, preferably as an outpatient when patient is stable and able to follow commands. 4. Healing nonpathologic left lateral eighth and ninth rib fractures. 5. Aortic Atherosclerosis (ICD10-I70.0).   12/08/2020 Cancer Staging   Staging form: Colon and Rectum, AJCC 8th Edition - Pathologic stage from 12/08/2020: Stage IIIB (pT3, pN1a, cM0) - Signed by Truitt Merle, MD on 12/11/2020 Stage prefix: Initial diagnosis Total positive nodes: 1 Histologic grading system: 4 grade system Histologic grade (G): G2 Residual tumor (R): R0 - None    12/08/2020 Pathology Results   FINAL MICROSCOPIC DIAGNOSIS:   A. COLON, RIGHT TRANSVERSE, COLECTOMY:  - Invasive moderately differentiated colorectal adenocarcinoma, 5 cm.  - Margins not involved.  - Metastatic carcinoma in one of twenty-three lymph nodes (1/23).  - Five tubular adenomas.  - Benign appendix.  - See oncology table.   ADDENDUM:  Mismatch Repair Protein (IHC)  SUMMARY INTERPRETATION: NORMAL    12/10/2020 Initial Diagnosis   Cancer of left colon ([place])    05/28/2021 Imaging   EXAM: CT CHEST, ABDOMEN AND PELVIS WITHOUT CONTRAST  IMPRESSION: 1. Prior right hemicolectomy with ileocolonic anastomosis. Prominent lymph nodes and stranding in the mesentery extending to the ileocolonic anastomosis in the left upper quadrant, likely reflecting postoperative change/inflammation. Close attention on follow-up studies is suggested. 2. No convincing evidence of residual/metastatic disease in the chest, abdomen or pelvis. 3. Short segment colonic wall thickening of a portion of decompressed sigmoid colon is new from prior and favored to reflect underdistention. Attention on follow-up studies is recommended. 4. Punctate nonobstructive right lower  pole renal stone. 5.  Aortic  Atherosclerosis (ICD10-I70.0).     CURRENT THERAPY:  -Surveillance, next surveillance scan ~11/2021   INTERVAL HISTORY: Bradley Stokes returns for follow up as scheduled. Last seen by Dr. Burr Medico for surveillance visit 05/30/21.     REVIEW OF SYSTEMS:   Constitutional: Denies fevers, chills or abnormal weight loss Eyes: Denies blurriness of vision Ears, nose, mouth, throat, and face: Denies mucositis or sore throat Respiratory: Denies cough, dyspnea or wheezes Cardiovascular: Denies palpitation, chest discomfort or lower extremity swelling Gastrointestinal:  Denies nausea, heartburn or change in bowel habits Skin: Denies abnormal skin rashes Lymphatics: Denies new lymphadenopathy or easy bruising Neurological:Denies numbness, tingling or new weaknesses Behavioral/Psych: Mood is stable, no new changes  All other systems were reviewed with the patient and are negative.  MEDICAL HISTORY:  Past Medical History:  Diagnosis Date   Arthritis    severe arthritis and deformity of his knee's   Chest pain    Chronic atrial fibrillation (HCC)    Chronic renal disease, stage III (HCC)    Chronic venous stasis dermatitis    colon ca 10/2020   Edema    Gout    History of cardiovascular stress test 01/17/2008   EF- 62%   Hypercholesterolemia    Hyperlipidemia    Hypertension    Kidney stones    Long term (current) use of anticoagulants    Long-term (current) use of anticoagulants    Obesity    Osteoarthritis    PAF (paroxysmal atrial fibrillation) (HCC)    Paroxysmal atrial fibrillation (HCC)    Secondary hypercoagulable state ([place])     SURGICAL HISTORY: Past Surgical History:  Procedure Laterality Date   BIOPSY  12/05/2020   Procedure: BIOPSY;  Surgeon: [name], MD;  Location[facility] ENDOSCOPY;  Service: Endoscopy;;   CARDIOLITE STRESS TEST     EF 62%   COLONOSCOPY N/A 12/05/2020   Procedure: COLONOSCOPY;  Surgeon: [name], MD;  Location: [facility] ENDOSCOPY;  Service: Endoscopy;   Laterality: N/A;   ESOPHAGOGASTRODUODENOSCOPY (EGD) WITH PROPOFOL N/A 12/05/2020   Procedure: ESOPHAGOGASTRODUODENOSCOPY (EGD) WITH PROPOFOL;  Surgeon: [name], MD;  Location: [facility] ENDOSCOPY;  Service: Endoscopy;  Laterality: N/A;   LAPAROSCOPIC LOW ANTERIOR RESCECTION WITH COLOANAL ANASTOMOSIS  12/08/2020   Procedure: LAPAROSCOPIC LOW ANTERIOR RESCECTION WITH COLOANAL ANASTOMOSIS;  Surgeon: Kieth Brightly, Arta Bruce, MD;  Location: [place];  Service: General;;   LAPAROSCOPIC PARTIAL COLECTOMY N/A 12/08/2020   Procedure: LAPAROSCOPIC ASSISTED POSSIBLE OPEN PARTIAL COLECTOMY WITH POSSIBLE OSTOMY;  Surgeon: Kieth Brightly, Arta Bruce, MD;  Location: [place];  Service: General;  Laterality: N/A;   POLYPECTOMY  12/05/2020   Procedure: POLYPECTOMY;  Surgeon: [name], MD;  Location: [facility] ENDOSCOPY;  Service: Endoscopy;;   SUBMUCOSAL TATTOO INJECTION  12/05/2020   Procedure: SUBMUCOSAL TATTOO INJECTION;  Surgeon: [name], MD;  Location: [facility] ENDOSCOPY;  Service: Endoscopy;;    I have reviewed the social history and family history with the patient and they are unchanged from previous note.  ALLERGIES:  is allergic to other.  MEDICATIONS:  Current Outpatient Medications  Medication Sig Dispense Refill   acetaminophen (TYLENOL) 325 MG tablet Take 325 mg by mouth every 6 (six) hours as needed (pain).      allopurinol (ZYLOPRIM) 100 MG tablet Take 100 mg by mouth daily.     amiodarone (PACERONE) 100 MG tablet Take 100 mg by mouth daily.     ammonium lactate (LAC-HYDRIN) 12 % lotion Apply 1 application topically 2 (two) times daily.  apixaban (ELIQUIS) 5 MG TABS tablet Take 1 tablet (5 mg total) by mouth 2 (two) times daily. 60 tablet    atorvastatin (LIPITOR) 20 MG tablet Take 20 mg by mouth Daily.     calcitRIOL (ROCALTROL) 0.5 MCG capsule Take 0.5 mcg daily by mouth.      furosemide (LASIX) 20 MG tablet TAKE 2 TABLETS BY MOUTH TWICE DAILY (Patient taking differently: Take 40 mg by mouth 2 (two) times daily.)  120 tablet 0   K Phos Mono-Sod Phos Di & Mono (VIRT-PHOS 250 NEUTRAL) [phone] MG TABS Take 1 tablet by mouth 2 (two) times a day.      Nutritional Supplements (,FEEDING SUPPLEMENT, PROSOURCE PLUS) liquid Take 30 mLs by mouth 3 (three) times daily between meals.     ondansetron (ZOFRAN) 4 MG tablet Take 4 mg by mouth daily.     pantoprazole (PROTONIX) 40 MG tablet Take 1 tablet (40 mg total) by mouth daily.     potassium chloride SA (KLOR-CON) 20 MEQ tablet TAKE 1 TABLET BY MOUTH DAILY (Patient taking differently: Take 20 mEq by mouth daily. TAKE 1 TABLET BY MOUTH DAILY) 90 tablet 3   traMADol (ULTRAM) 50 MG tablet Take 1 tablet by mouth every 6 (six) hours as needed.     No current facility-administered medications for this visit.    PHYSICAL EXAMINATION: ECOG PERFORMANCE STATUS: {CHL ONC ECOG PS:920-699-2500}  There were no vitals filed for this visit. There were no vitals filed for this visit.  GENERAL:alert, no distress and comfortable SKIN: skin color, texture, turgor are normal, no rashes or significant lesions EYES: normal, Conjunctiva are pink and non-injected, sclera clear OROPHARYNX:no exudate, no erythema and lips, buccal mucosa, and tongue normal  NECK: supple, thyroid normal size, non-tender, without nodularity LYMPH:  no palpable lymphadenopathy in the cervical, axillary or inguinal LUNGS: clear to auscultation and percussion with normal breathing effort HEART: regular rate & rhythm and no murmurs and no lower extremity edema ABDOMEN:abdomen soft, non-tender and normal bowel sounds Musculoskeletal:no cyanosis of digits and no clubbing  NEURO: alert & oriented x 3 with fluent speech, no focal motor/sensory deficits  LABORATORY DATA:  I have reviewed the data as listed    Latest Ref Rng & Units 05/28/2021   11:38 AM 02/26/2021    9:57 AM 01/16/2021   12:21 PM  CBC  WBC 4.0 - 10.5 K/uL 7.8   8.8   9.8    Hemoglobin 13.0 - 17.0 g/dL 10.5   10.4   9.4    Hematocrit  39.0 - 52.0 % 34.2   35.1   31.7    Platelets 150 - 400 K/uL 295   330   737          Latest Ref Rng & Units 05/28/2021   11:38 AM 02/26/2021    9:57 AM 01/16/2021   12:21 PM  CMP  Glucose 70 - 99 mg/dL 103   98   103    BUN 8 - 23 mg/dL 28   26   32    Creatinine 0.61 - 1.24 mg/dL 2.36   2.16   2.71    Sodium 135 - 145 mmol/L 141   140   144    Potassium 3.5 - 5.1 mmol/L 3.8   4.7   4.0    Chloride 98 - 111 mmol/L 102   103   100    CO2 22 - 32 mmol/L '31   31   29    ' Calcium  8.9 - 10.3 mg/dL 10.0   9.3   9.9    Total Protein 6.5 - 8.1 g/dL 7.5   7.1   7.6    Total Bilirubin 0.3 - 1.2 mg/dL 0.9   1.1   0.7    Alkaline Phos 38 - 126 U/L 117   101   88    AST 15 - 41 U/L '9   14   14    ' ALT 0 - 44 U/L <'5   7   5        ' RADIOGRAPHIC STUDIES: I have personally reviewed the radiological images as listed and agreed with the findings in the report. No results found.   ASSESSMENT & PLAN:  No problem-specific Assessment & Plan notes found for this encounter.   No orders of the defined types were placed in this encounter.  All questions were answered. The patient knows to call the clinic with any problems, questions or concerns. No barriers to learning was detected. I spent {CHL ONC TIME VISIT - QRFXJ:[id number]} counseling the patient face to face. The total time spent in the appointment was {CHL ONC TIME VISIT - EBRAX:[id number]} and more than 50% was on counseling and review of test results     Alla Feeling, NP 08/25/21

## 2021-08-28 ENCOUNTER — Inpatient Hospital Stay: Payer: Medicare HMO | Attending: Hematology

## 2021-08-28 ENCOUNTER — Inpatient Hospital Stay: Payer: Medicare HMO | Admitting: Nurse Practitioner

## 2021-09-02 ENCOUNTER — Telehealth: Payer: Self-pay | Admitting: Nurse Practitioner

## 2021-09-02 NOTE — Telephone Encounter (Signed)
Called pt to sch per 6/5 , pt poa instructed me to call nursing home, [facility] is aware of appt

## 2021-09-24 ENCOUNTER — Other Ambulatory Visit: Payer: Self-pay

## 2021-09-24 DIAGNOSIS — C186 Malignant neoplasm of descending colon: Secondary | ICD-10-CM

## 2021-09-24 NOTE — Progress Notes (Deleted)
[place]   Telephone:(336) [phone] Fax:(336) [phone]   Clinic Follow up Note   Patient Care Team: [name], [name], MD as PCP - General (Family Medicine) Jerline Pain, MD as PCP - Cardiology (Cardiology) 09/24/2021  CHIEF COMPLAINT: Follow-up colon cancer  SUMMARY OF ONCOLOGIC HISTORY: Oncology History Overview Note  Cancer Staging Cancer of left colon [facility]) Staging form: Colon and Rectum, AJCC 8th Edition - Pathologic stage from 12/08/2020: Stage IIIB (pT3, pN1a, cM0) - Signed by Truitt Merle, MD on 12/11/2020    Cancer of left colon ([place])  12/05/2020 Procedure   Colonoscopy, under Dr. Collene Mares  Impression: - Preparation of the colon was extremely poor inspite of aggressive lavage. - Diverticulosis in the sigmoid colon. - Likely malignant 5 cm tumor in the proximal descending colon-biopsied; mucosa 5 cm distal to the mass was injected with 3 cc's of [place] ink. - One 10 mm polyp in the cecum, removed with a hot snare x 1; resected and retrieved. - Moderate sized internal hemorrhoids.   12/05/2020 Pathology Results   FINAL MICROSCOPIC DIAGNOSIS:   A. COLON, CECAL, POLYPECTOMY:  - Tubular adenoma  - Negative for high-grade dysplasia or malignancy   B. COLON MASS, DESCENDING, BIOPSY:  - Invasive moderately differentiated adenocarcinoma, see comment    ADDENDUM: Mismatch Repair Protein (IHC)  SUMMARY INTERPRETATION: NORMAL    12/06/2020 Imaging   CT CAP  IMPRESSION: 1. Short segment of colonic wall thickening involving the splenic flexure likely reflects patient's presumed colonic malignancy. 2. Single 1.3 cm lymph node posterior to the colonic lesion likely reflecting local nodal involvement. No additional pathologically enlarged abdominal or pelvic lymph nodes to suggest distant nodal metastases. 3. Two subcentimeter hypodense hepatic lesions, incompletely characterized on this examination. Possibly reflecting benign hepatic cysts but cystic hepatic  metastases not excluded on this study. Consider attention on close interval follow-up imaging versus further evaluation with hepatic protocol MRI with and without contrast, preferably as an outpatient when patient is stable and able to follow commands. 4. Healing nonpathologic left lateral eighth and ninth rib fractures. 5. Aortic Atherosclerosis (ICD10-I70.0).   12/08/2020 Cancer Staging   Staging form: Colon and Rectum, AJCC 8th Edition - Pathologic stage from 12/08/2020: Stage IIIB (pT3, pN1a, cM0) - Signed by Truitt Merle, MD on 12/11/2020 Stage prefix: Initial diagnosis Total positive nodes: 1 Histologic grading system: 4 grade system Histologic grade (G): G2 Residual tumor (R): R0 - None   12/08/2020 Pathology Results   FINAL MICROSCOPIC DIAGNOSIS:   A. COLON, RIGHT TRANSVERSE, COLECTOMY:  - Invasive moderately differentiated colorectal adenocarcinoma, 5 cm.  - Margins not involved.  - Metastatic carcinoma in one of twenty-three lymph nodes (1/23).  - Five tubular adenomas.  - Benign appendix.  - See oncology table.   ADDENDUM:  Mismatch Repair Protein (IHC)  SUMMARY INTERPRETATION: NORMAL    12/10/2020 Initial Diagnosis   Cancer of left colon ([place])   05/28/2021 Imaging   EXAM: CT CHEST, ABDOMEN AND PELVIS WITHOUT CONTRAST  IMPRESSION: 1. Prior right hemicolectomy with ileocolonic anastomosis. Prominent lymph nodes and stranding in the mesentery extending to the ileocolonic anastomosis in the left upper quadrant, likely reflecting postoperative change/inflammation. Close attention on follow-up studies is suggested. 2. No convincing evidence of residual/metastatic disease in the chest, abdomen or pelvis. 3. Short segment colonic wall thickening of a portion of decompressed sigmoid colon is new from prior and favored to reflect underdistention. Attention on follow-up studies is recommended. 4. Punctate nonobstructive right lower pole renal stone. 5.  Aortic Atherosclerosis  (ICD10-I70.0).     CURRENT THERAPY: Surveillance  INTERVAL HISTORY: Mr. Tesfaye returns for follow-up as scheduled, last seen by Dr. Burr Medico 05/30/2021.  He continues surveillance.  Next CT and colonoscopy due in September   REVIEW OF SYSTEMS:   Constitutional: Denies fevers, chills or abnormal weight loss Eyes: Denies blurriness of vision Ears, nose, mouth, throat, and face: Denies mucositis or sore throat Respiratory: Denies cough, dyspnea or wheezes Cardiovascular: Denies palpitation, chest discomfort or lower extremity swelling Gastrointestinal:  Denies nausea, heartburn or change in bowel habits Skin: Denies abnormal skin rashes Lymphatics: Denies new lymphadenopathy or easy bruising Neurological:Denies numbness, tingling or new weaknesses Behavioral/Psych: Mood is stable, no new changes  All other systems were reviewed with the patient and are negative.  MEDICAL HISTORY:  Past Medical History:  Diagnosis Date   Arthritis    severe arthritis and deformity of his knee's   Chest pain    Chronic atrial fibrillation (HCC)    Chronic renal disease, stage III (HCC)    Chronic venous stasis dermatitis    colon ca 10/2020   Edema    Gout    History of cardiovascular stress test 01/17/2008   EF- 62%   Hypercholesterolemia    Hyperlipidemia    Hypertension    Kidney stones    Long term (current) use of anticoagulants    Long-term (current) use of anticoagulants    Obesity    Osteoarthritis    PAF (paroxysmal atrial fibrillation) (HCC)    Paroxysmal atrial fibrillation (HCC)    Secondary hypercoagulable state ([place])     SURGICAL HISTORY: Past Surgical History:  Procedure Laterality Date   BIOPSY  12/05/2020   Procedure: BIOPSY;  Surgeon: [name], MD;  Location: [facility] ENDOSCOPY;  Service: Endoscopy;;   CARDIOLITE STRESS TEST     EF 62%   COLONOSCOPY N/A 12/05/2020   Procedure: COLONOSCOPY;  Surgeon: [name], MD;  Location: [facility] ENDOSCOPY;  Service: Endoscopy;  Laterality:  N/A;   ESOPHAGOGASTRODUODENOSCOPY (EGD) WITH PROPOFOL N/A 12/05/2020   Procedure: ESOPHAGOGASTRODUODENOSCOPY (EGD) WITH PROPOFOL;  Surgeon: [name], MD;  Location: [facility] ENDOSCOPY;  Service: Endoscopy;  Laterality: N/A;   LAPAROSCOPIC LOW ANTERIOR RESCECTION WITH COLOANAL ANASTOMOSIS  12/08/2020   Procedure: LAPAROSCOPIC LOW ANTERIOR RESCECTION WITH COLOANAL ANASTOMOSIS;  Surgeon: Kieth Brightly, Arta Bruce, MD;  Location: [place];  Service: General;;   LAPAROSCOPIC PARTIAL COLECTOMY N/A 12/08/2020   Procedure: LAPAROSCOPIC ASSISTED POSSIBLE OPEN PARTIAL COLECTOMY WITH POSSIBLE OSTOMY;  Surgeon: Kieth Brightly, Arta Bruce, MD;  Location: [place];  Service: General;  Laterality: N/A;   POLYPECTOMY  12/05/2020   Procedure: POLYPECTOMY;  Surgeon: [name], MD;  Location: [facility] ENDOSCOPY;  Service: Endoscopy;;   SUBMUCOSAL TATTOO INJECTION  12/05/2020   Procedure: SUBMUCOSAL TATTOO INJECTION;  Surgeon: [name], MD;  Location: [facility] ENDOSCOPY;  Service: Endoscopy;;    I have reviewed the social history and family history with the patient and they are unchanged from previous note.  ALLERGIES:  is allergic to other.  MEDICATIONS:  Current Outpatient Medications  Medication Sig Dispense Refill   acetaminophen (TYLENOL) 325 MG tablet Take 325 mg by mouth every 6 (six) hours as needed (pain).      allopurinol (ZYLOPRIM) 100 MG tablet Take 100 mg by mouth daily.     amiodarone (PACERONE) 100 MG tablet Take 100 mg by mouth daily.     ammonium lactate (LAC-HYDRIN) 12 % lotion Apply 1 application topically 2 (two) times daily.     apixaban (ELIQUIS)  5 MG TABS tablet Take 1 tablet (5 mg total) by mouth 2 (two) times daily. 60 tablet    atorvastatin (LIPITOR) 20 MG tablet Take 20 mg by mouth Daily.     calcitRIOL (ROCALTROL) 0.5 MCG capsule Take 0.5 mcg daily by mouth.      furosemide (LASIX) 20 MG tablet TAKE 2 TABLETS BY MOUTH TWICE DAILY (Patient taking differently: Take 40 mg by mouth 2 (two) times daily.) 120 tablet  0   K Phos Mono-Sod Phos Di & Mono (VIRT-PHOS 250 NEUTRAL) [phone] MG TABS Take 1 tablet by mouth 2 (two) times a day.      Nutritional Supplements (,FEEDING SUPPLEMENT, PROSOURCE PLUS) liquid Take 30 mLs by mouth 3 (three) times daily between meals.     ondansetron (ZOFRAN) 4 MG tablet Take 4 mg by mouth daily.     pantoprazole (PROTONIX) 40 MG tablet Take 1 tablet (40 mg total) by mouth daily.     potassium chloride SA (KLOR-CON) 20 MEQ tablet TAKE 1 TABLET BY MOUTH DAILY (Patient taking differently: Take 20 mEq by mouth daily. TAKE 1 TABLET BY MOUTH DAILY) 90 tablet 3   traMADol (ULTRAM) 50 MG tablet Take 1 tablet by mouth every 6 (six) hours as needed.     No current facility-administered medications for this visit.    PHYSICAL EXAMINATION: ECOG PERFORMANCE STATUS: {CHL ONC ECOG PS:[id number]}  There were no vitals filed for this visit. There were no vitals filed for this visit.  GENERAL:alert, no distress and comfortable SKIN: skin color, texture, turgor are normal, no rashes or significant lesions EYES: normal, Conjunctiva are pink and non-injected, sclera clear OROPHARYNX:no exudate, no erythema and lips, buccal mucosa, and tongue normal  NECK: supple, thyroid normal size, non-tender, without nodularity LYMPH:  no palpable lymphadenopathy in the cervical, axillary or inguinal LUNGS: clear to auscultation and percussion with normal breathing effort HEART: regular rate & rhythm and no murmurs and no lower extremity edema ABDOMEN:abdomen soft, non-tender and normal bowel sounds Musculoskeletal:no cyanosis of digits and no clubbing  NEURO: alert & oriented x 3 with fluent speech, no focal motor/sensory deficits  LABORATORY DATA:  I have reviewed the data as listed    Latest Ref Rng & Units 05/28/2021   11:38 AM 02/26/2021    9:57 AM 01/16/2021   12:21 PM  CBC  WBC 4.0 - 10.5 K/uL 7.8  8.8  9.8   Hemoglobin 13.0 - 17.0 g/dL 10.5  10.4  9.4   Hematocrit 39.0 - 52.0 % 34.2   35.1  31.7   Platelets 150 - 400 K/uL 295  330  737         Latest Ref Rng & Units 05/28/2021   11:38 AM 02/26/2021    9:57 AM 01/16/2021   12:21 PM  CMP  Glucose 70 - 99 mg/dL 103  98  103   BUN 8 - 23 mg/dL 28  26  32   Creatinine 0.61 - 1.24 mg/dL 2.36  2.16  2.71   Sodium 135 - 145 mmol/L 141  140  144   Potassium 3.5 - 5.1 mmol/L 3.8  4.7  4.0   Chloride 98 - 111 mmol/L 102  103  100   CO2 22 - 32 mmol/L '31  31  29   ' Calcium 8.9 - 10.3 mg/dL 10.0  9.3  9.9   Total Protein 6.5 - 8.1 g/dL 7.5  7.1  7.6   Total Bilirubin 0.3 - 1.2 mg/dL 0.9  1.1  0.7  Alkaline Phos 38 - 126 U/L 117  101  88   AST 15 - 41 U/L '9  14  14   ' ALT 0 - 44 U/L <'5  7  5       ' RADIOGRAPHIC STUDIES: I have personally reviewed the radiological images as listed and agreed with the findings in the report. No results found.   ASSESSMENT & PLAN:  No problem-specific Assessment & Plan notes found for this encounter.   No orders of the defined types were placed in this encounter.  All questions were answered. The patient knows to call the clinic with any problems, questions or concerns. No barriers to learning was detected. I spent {CHL ONC TIME VISIT - UTMLY:[id number]} counseling the patient face to face. The total time spent in the appointment was {CHL ONC TIME VISIT - LEXNT:[id number]} and more than 50% was on counseling and review of test results     Alla Feeling, NP 09/24/21

## 2021-09-26 ENCOUNTER — Inpatient Hospital Stay: Payer: Medicare HMO | Admitting: Nurse Practitioner

## 2021-09-26 ENCOUNTER — Inpatient Hospital Stay: Payer: Medicare HMO | Attending: Hematology

## 2021-11-12 ENCOUNTER — Inpatient Hospital Stay: Payer: Medicare HMO | Attending: Hematology

## 2021-11-12 ENCOUNTER — Inpatient Hospital Stay: Payer: Medicare HMO | Admitting: Nurse Practitioner

## 2021-11-12 ENCOUNTER — Encounter: Payer: Self-pay | Admitting: Nurse Practitioner

## 2021-11-12 ENCOUNTER — Other Ambulatory Visit: Payer: Self-pay

## 2021-11-12 VITALS — BP 145/74 | HR 82 | Temp 98.5°F | Resp 16

## 2021-11-12 DIAGNOSIS — Z79899 Other long term (current) drug therapy: Secondary | ICD-10-CM | POA: Diagnosis not present

## 2021-11-12 DIAGNOSIS — I480 Paroxysmal atrial fibrillation: Secondary | ICD-10-CM | POA: Diagnosis not present

## 2021-11-12 DIAGNOSIS — I129 Hypertensive chronic kidney disease with stage 1 through stage 4 chronic kidney disease, or unspecified chronic kidney disease: Secondary | ICD-10-CM | POA: Diagnosis not present

## 2021-11-12 DIAGNOSIS — Z7901 Long term (current) use of anticoagulants: Secondary | ICD-10-CM | POA: Insufficient documentation

## 2021-11-12 DIAGNOSIS — I482 Chronic atrial fibrillation, unspecified: Secondary | ICD-10-CM | POA: Insufficient documentation

## 2021-11-12 DIAGNOSIS — D631 Anemia in chronic kidney disease: Secondary | ICD-10-CM | POA: Diagnosis not present

## 2021-11-12 DIAGNOSIS — C186 Malignant neoplasm of descending colon: Secondary | ICD-10-CM

## 2021-11-12 DIAGNOSIS — N183 Chronic kidney disease, stage 3 unspecified: Secondary | ICD-10-CM | POA: Insufficient documentation

## 2021-11-12 DIAGNOSIS — Z85038 Personal history of other malignant neoplasm of large intestine: Secondary | ICD-10-CM | POA: Insufficient documentation

## 2021-11-12 LAB — COMPREHENSIVE METABOLIC PANEL
ALT: 7 U/L (ref 0–44)
AST: 13 U/L — ABNORMAL LOW (ref 15–41)
Albumin: 3.3 g/dL — ABNORMAL LOW (ref 3.5–5.0)
Alkaline Phosphatase: 101 U/L (ref 38–126)
Anion gap: 9 (ref 5–15)
BUN: 38 mg/dL — ABNORMAL HIGH (ref 8–23)
CO2: 22 mmol/L (ref 22–32)
Calcium: 9.4 mg/dL (ref 8.9–10.3)
Chloride: 110 mmol/L (ref 98–111)
Creatinine, Ser: 2.77 mg/dL — ABNORMAL HIGH (ref 0.61–1.24)
GFR, Estimated: 23 mL/min — ABNORMAL LOW (ref >60)
Glucose, Bld: 107 mg/dL — ABNORMAL HIGH (ref 70–99)
Potassium: 3.8 mmol/L (ref 3.5–5.1)
Sodium: 141 mmol/L (ref 135–145)
Total Bilirubin: 0.8 mg/dL (ref 0.3–1.2)
Total Protein: 6.7 g/dL (ref 6.5–8.1)

## 2021-11-12 LAB — CBC WITH DIFFERENTIAL/PLATELET
Abs Immature Granulocytes: 0.02 10*3/uL (ref 0.00–0.07)
Basophils Absolute: 0.1 10*3/uL (ref 0.0–0.1)
Basophils Relative: 1 %
Eosinophils Absolute: 0.4 10*3/uL (ref 0.0–0.5)
Eosinophils Relative: 6 %
HCT: 34.4 % — ABNORMAL LOW (ref 39.0–52.0)
Hemoglobin: 11.2 g/dL — ABNORMAL LOW (ref 13.0–17.0)
Immature Granulocytes: 0 %
Lymphocytes Relative: 29 %
Lymphs Abs: 2.1 10*3/uL (ref 0.7–4.0)
MCH: 33.1 pg (ref 26.0–34.0)
MCHC: 32.6 g/dL (ref 30.0–36.0)
MCV: 101.8 fL — ABNORMAL HIGH (ref 80.0–100.0)
Monocytes Absolute: 0.3 10*3/uL (ref 0.1–1.0)
Monocytes Relative: 5 %
Neutro Abs: 4.2 10*3/uL (ref 1.7–7.7)
Neutrophils Relative %: 59 %
Platelets: 314 10*3/uL (ref 150–400)
RBC: 3.38 MIL/uL — ABNORMAL LOW (ref 4.22–5.81)
RDW: 13.3 % (ref 11.5–15.5)
WBC: 7.1 10*3/uL (ref 4.0–10.5)
nRBC: 0 % (ref 0.0–0.2)

## 2021-11-12 LAB — IRON AND IRON BINDING CAPACITY (CC-WL,HP ONLY)
Iron: 164 ug/dL (ref 45–182)
Saturation Ratios: 54 % — ABNORMAL HIGH (ref 17.9–39.5)
TIBC: 307 ug/dL (ref 250–450)
UIBC: 143 ug/dL (ref 117–376)

## 2021-11-12 NOTE — Progress Notes (Signed)
Bradley Stokes   [phone] [phone]   Clinic Follow up Note   Patient Care Team: [name], [name], MD as PCP - General (Family Medicine) Jerline Pain, MD as PCP - Cardiology (Cardiology) 11/12/2021  CHIEF COMPLAINT: Follow-up stage III colon cancer  SUMMARY OF ONCOLOGIC HISTORY: Oncology History Overview Note  Cancer Staging Cancer of left colon [facility]) Staging form: Colon and Rectum, AJCC 8th Edition - Pathologic stage from 12/08/2020: Stage IIIB (pT3, pN1a, cM0) - Signed by Truitt Merle, MD on 12/11/2020    Cancer of left colon ([place])  12/05/2020 Procedure   Colonoscopy, under Dr. Collene Mares  Impression: - Preparation of the colon was extremely poor inspite of aggressive lavage. - Diverticulosis in the sigmoid colon. - Likely malignant 5 cm tumor in the proximal descending colon-biopsied; mucosa 5 cm distal to the mass was injected with 3 cc's of [place] ink. - One 10 mm polyp in the cecum, removed with a hot snare x 1; resected and retrieved. - Moderate sized internal hemorrhoids.   12/05/2020 Pathology Results   FINAL MICROSCOPIC DIAGNOSIS:   A. COLON, CECAL, POLYPECTOMY:  - Tubular adenoma  - Negative for high-grade dysplasia or malignancy   B. COLON MASS, DESCENDING, BIOPSY:  - Invasive moderately differentiated adenocarcinoma, see comment    ADDENDUM: Mismatch Repair Protein (IHC)  SUMMARY INTERPRETATION: NORMAL    12/06/2020 Imaging   CT CAP  IMPRESSION: 1. Short segment of colonic wall thickening involving the splenic flexure likely reflects patient's presumed colonic malignancy. 2. Single 1.3 cm lymph node posterior to the colonic lesion likely reflecting local nodal involvement. No additional pathologically enlarged abdominal or pelvic lymph nodes to suggest distant nodal metastases. 3. Two subcentimeter hypodense hepatic lesions, incompletely characterized on this examination. Possibly reflecting benign hepatic cysts but cystic  hepatic metastases not excluded on this study. Consider attention on close interval follow-up imaging versus further evaluation with hepatic protocol MRI with and without contrast, preferably as an outpatient when patient is stable and able to follow commands. 4. Healing nonpathologic left lateral eighth and ninth rib fractures. 5. Aortic Atherosclerosis (ICD10-I70.0).   12/08/2020 Cancer Staging   Staging form: Colon and Rectum, AJCC 8th Edition - Pathologic stage from 12/08/2020: Stage IIIB (pT3, pN1a, cM0) - Signed by Truitt Merle, MD on 12/11/2020 Stage prefix: Initial diagnosis Total positive nodes: 1 Histologic grading system: 4 grade system Histologic grade (G): G2 Residual tumor (R): R0 - None   12/08/2020 Pathology Results   FINAL MICROSCOPIC DIAGNOSIS:   A. COLON, RIGHT TRANSVERSE, COLECTOMY:  - Invasive moderately differentiated colorectal adenocarcinoma, 5 cm.  - Margins not involved.  - Metastatic carcinoma in one of twenty-three lymph nodes (1/23).  - Five tubular adenomas.  - Benign appendix.  - See oncology table.   ADDENDUM:  Mismatch Repair Protein (IHC)  SUMMARY INTERPRETATION: NORMAL    12/10/2020 Initial Diagnosis   Cancer of left colon ([place])   05/28/2021 Imaging   EXAM: CT CHEST, ABDOMEN AND PELVIS WITHOUT CONTRAST  IMPRESSION: 1. Prior right hemicolectomy with ileocolonic anastomosis. Prominent lymph nodes and stranding in the mesentery extending to the ileocolonic anastomosis in the left upper quadrant, likely reflecting postoperative change/inflammation. Close attention on follow-up studies is suggested. 2. No convincing evidence of residual/metastatic disease in the chest, abdomen or pelvis. 3. Short segment colonic wall thickening of a portion of decompressed sigmoid colon is new from prior and favored to reflect underdistention. Attention on follow-up studies is recommended. 4. Punctate nonobstructive right lower pole renal  stone. 5.  Aortic  Atherosclerosis (ICD10-I70.0).     CURRENT THERAPY: Surveillance  INTERVAL HISTORY: Bradley Stokes returns for follow-up as scheduled, last seen by Dr. Burr Medico 05/30/2021.  He continues to reside in a nursing facility, his sister is a resident in the room beside him.  He cannot stand up for weight today, not sure if he has lost weight.  He does not like the food there and only eats 1 meal a day plus snacks.  A friend brings food from outside.  He is not on supplements but is given vitamins.  Does not like the taste of boost/Ensure but could force it if needed.  Denies change in bowel habits, bloody stools, or new abdominal pain/bloating.  He is not interested in doing the colonoscopy prep.  Also in the past couple months he has had recurrent knee pain and is completely nonweightbearing.  Two weeks ago he had injections and has started PT, hopes to be back up on his feet soon.  All other systems were reviewed with the patient and are negative.  MEDICAL HISTORY:  Past Medical History:  Diagnosis Date   Arthritis    severe arthritis and deformity of his knee's   Chest pain    Chronic atrial fibrillation (HCC)    Chronic renal disease, stage III (HCC)    Chronic venous stasis dermatitis    colon ca 10/2020   Edema    Gout    History of cardiovascular stress test 01/17/2008   EF- 62%   Hypercholesterolemia    Hyperlipidemia    Hypertension    Kidney stones    Long term (current) use of anticoagulants    Long-term (current) use of anticoagulants    Obesity    Osteoarthritis    PAF (paroxysmal atrial fibrillation) (HCC)    Paroxysmal atrial fibrillation (HCC)    Secondary hypercoagulable state ([place])     SURGICAL HISTORY: Past Surgical History:  Procedure Laterality Date   BIOPSY  12/05/2020   Procedure: BIOPSY;  Surgeon: [name], MD;  Location: [facility] ENDOSCOPY;  Service: Endoscopy;;   CARDIOLITE STRESS TEST     EF 62%   COLONOSCOPY N/A 12/05/2020   Procedure: COLONOSCOPY;  Surgeon: [name], MD;  Location: [facility] ENDOSCOPY;  Service: Endoscopy;  Laterality: N/A;   ESOPHAGOGASTRODUODENOSCOPY (EGD) WITH PROPOFOL N/A 12/05/2020   Procedure: ESOPHAGOGASTRODUODENOSCOPY (EGD) WITH PROPOFOL;  Surgeon: [name], MD;  Location: [facility] ENDOSCOPY;  Service: Endoscopy;  Laterality: N/A;   LAPAROSCOPIC LOW ANTERIOR RESCECTION WITH COLOANAL ANASTOMOSIS  12/08/2020   Procedure: LAPAROSCOPIC LOW ANTERIOR RESCECTION WITH COLOANAL ANASTOMOSIS;  Surgeon: Kieth Brightly, Arta Bruce, MD;  Location: [place];  Service: General;;   LAPAROSCOPIC PARTIAL COLECTOMY N/A 12/08/2020   Procedure: LAPAROSCOPIC ASSISTED POSSIBLE OPEN PARTIAL COLECTOMY WITH POSSIBLE OSTOMY;  Surgeon: Kieth Brightly, Arta Bruce, MD;  Location: [place];  Service: General;  Laterality: N/A;   POLYPECTOMY  12/05/2020   Procedure: POLYPECTOMY;  Surgeon: [name], MD;  Location: [facility] ENDOSCOPY;  Service: Endoscopy;;   SUBMUCOSAL TATTOO INJECTION  12/05/2020   Procedure: SUBMUCOSAL TATTOO INJECTION;  Surgeon: [name], MD;  Location: [facility] ENDOSCOPY;  Service: Endoscopy;;    I have reviewed the social history and family history with the patient and they are unchanged from previous note.  ALLERGIES:  is allergic to other.  MEDICATIONS:  Current Outpatient Medications  Medication Sig Dispense Refill   acetaminophen (TYLENOL) 325 MG tablet Take 325 mg by mouth every 6 (six) hours as needed (pain).  allopurinol (ZYLOPRIM) 100 MG tablet Take 100 mg by mouth daily.     amiodarone (PACERONE) 100 MG tablet Take 100 mg by mouth daily.     ammonium lactate (LAC-HYDRIN) 12 % lotion Apply 1 application topically 2 (two) times daily.     apixaban (ELIQUIS) 5 MG TABS tablet Take 1 tablet (5 mg total) by mouth 2 (two) times daily. 60 tablet    atorvastatin (LIPITOR) 20 MG tablet Take 20 mg by mouth Daily.     calcitRIOL (ROCALTROL) 0.5 MCG capsule Take 0.5 mcg daily by mouth.      furosemide (LASIX) 20 MG tablet TAKE 2 TABLETS BY MOUTH TWICE DAILY (Patient  taking differently: Take 40 mg by mouth 2 (two) times daily.) 120 tablet 0   K Phos Mono-Sod Phos Di & Mono (VIRT-PHOS 250 NEUTRAL) [phone] MG TABS Take 1 tablet by mouth 2 (two) times a day.      Nutritional Supplements (,FEEDING SUPPLEMENT, PROSOURCE PLUS) liquid Take 30 mLs by mouth 3 (three) times daily between meals.     potassium chloride SA (KLOR-CON) 20 MEQ tablet TAKE 1 TABLET BY MOUTH DAILY (Patient taking differently: Take 20 mEq by mouth daily. TAKE 1 TABLET BY MOUTH DAILY) 90 tablet 3   traMADol (ULTRAM) 50 MG tablet Take 1 tablet by mouth every 6 (six) hours as needed.     ondansetron (ZOFRAN) 4 MG tablet Take 4 mg by mouth daily.     pantoprazole (PROTONIX) 40 MG tablet Take 1 tablet (40 mg total) by mouth daily.     No current facility-administered medications for this visit.    PHYSICAL EXAMINATION: ECOG PERFORMANCE STATUS: 3 - Symptomatic, >50% confined to bed  Vitals:   11/12/21 1032  BP: (!) 145/74  Pulse: 82  Resp: 16  Temp: 98.5 F (36.9 C)  SpO2: 95%   Filed Weights    GENERAL:alert, no distress and comfortable SKIN: dry skin, no rash.  EYES: sclera clear NECK: Without mass LYMPH:  no palpable cervical or supraclavicular lymphadenopathy  LUNGS: clear with normal breathing effort HEART: regular rate & rhythm, severe venous stasis changes over the lower extremities ABDOMEN:abdomen soft, non-tender and normal bowel sounds NEURO: alert & oriented x 3 with fluent speech.  Presents in wheelchair.  Cannot bear weight  LABORATORY DATA:  I have reviewed the data as listed    Latest Ref Rng & Units 11/12/2021   10:16 AM 05/28/2021   11:38 AM 02/26/2021    9:57 AM  CBC  WBC 4.0 - 10.5 K/uL 7.1  7.8  8.8   Hemoglobin 13.0 - 17.0 g/dL 11.2  10.5  10.4   Hematocrit 39.0 - 52.0 % 34.4  34.2  35.1   Platelets 150 - 400 K/uL 314  295  330         Latest Ref Rng & Units 11/12/2021   10:16 AM 05/28/2021   11:38 AM 02/26/2021    9:57 AM  CMP  Glucose 70 -  99 mg/dL 107  103  98   BUN 8 - 23 mg/dL 38  28  26   Creatinine 0.61 - 1.24 mg/dL 2.77  2.36  2.16   Sodium 135 - 145 mmol/L 141  141  140   Potassium 3.5 - 5.1 mmol/L 3.8  3.8  4.7   Chloride 98 - 111 mmol/L 110  102  103   CO2 22 - 32 mmol/L '22  31  31   ' Calcium 8.9 - 10.3 mg/dL 9.4  10.0  9.3   Total Protein 6.5 - 8.1 g/dL 6.7  7.5  7.1   Total Bilirubin 0.3 - 1.2 mg/dL 0.8  0.9  1.1   Alkaline Phos 38 - 126 U/L 101  117  101   AST 15 - 41 U/L '13  9  14   ' ALT 0 - 44 U/L 7  <5  7       RADIOGRAPHIC STUDIES: I have personally reviewed the radiological images as listed and agreed with the findings in the report. No results found.   ASSESSMENT & PLAN: Bradley Stokes is a 77 y.o. male with    1. Cancer of right transverse colon, pT3N1aM0 stage IIIB -Diagnosed in 11/2020, s/p right colectomy on 12/08/20 with Dr. Kieth Brightly. Pathology showed 5 cm tumor, margins not involved, 1 of 23 lymph nodes involved, and five tubular adenomas -liver MRI on 12/10/20 showed two probable liver cysts, not considered suspicious for metastatic disease. -due to his poor performance status, and stage IV kidney disease, he was not a candidate for adjuvant chemo. he is on surveillance   -surveillance CT CAP on 05/28/21 showed postoperative changes, no convincing evidence of residual/metastatic disease.  -Bradley Stokes is clinically doing well from a colon cancer standpoint.  Encouraged him to improve his nutrition if possible and wrote a paper prescription for his facility to administer 1 supplement daily as tolerated -Labs reviewed, anemia is improving; CMP is stable.  We will follow-up the pending iron studies from today -Continue colon cancer surveillance.  He is nearing 1 year from initial diagnosis and surgery.  I reviewed the standard surveillance plan includes a colonoscopy and CT scan.  He declined colonoscopy due to poor tolerance to prep, I will CC my note to Dr. Collene Mares to inform her. -He has a overall poor  performance status and is currently nonweightbearing due to knee pain, re recently received injections.  He does not think he can do CT right now.  I encouraged him to continue PT participation to get stronger.  If he does, he may proceed with CT scan in the next couple months.  -Return for lab and surveillance follow-up in 4 months, or sooner if needed   2. HTN, AF, CKD stage IV -F/u with PCP -CKD worse, he continues follow-up with nephrology and cardiology   3. Anemia  -Secondary to recent surgery, colon cancer, and chronic kidney disease -Slightly improved today -continue oral iron    4. Social support -he previously lived with his sister. They are now both in the same skilled rehab facility for different reasons.   5. Knee pain  -f/u with NP at SNF -Recently received injections to both knees  -Currently completely nonweightbearing, encouraged him to continue PT participation to get stronger    Plan: -Continue colon cancer surveillance -Rx given to facility transporter for nutrition supplement -Pt is nonweightbearing, not able to do CT scan, will postpone 1-3 months.  If he gets stronger he can schedule  -F/up in 4 months -Cc not to Dr. Collene Mares, pt declining colonoscopy    Orders Placed This Encounter  Procedures   CT Abdomen Pelvis Wo Contrast    Standing Status:   Future    Standing Expiration Date:   11/12/2022    Order Specific Question:   Preferred imaging location?    Answer:   [facility]    Order Specific Question:   Is Oral Contrast requested for this exam?    Answer:   Yes, Per Radiology protocol  All questions were answered. The patient knows to call the clinic with any problems, questions or concerns. No barriers to learning were detected.      Alla Feeling, NP 11/12/21

## 2021-11-13 NOTE — Progress Notes (Signed)
Per providers request, office visit notes faxed over to Dr. [name] for review.  Faxed via Epic.  No further concerns noted at this time.

## 2021-11-20 ENCOUNTER — Telehealth: Payer: Self-pay

## 2021-11-20 NOTE — Telephone Encounter (Signed)
-----   Message from Alla Feeling, NP sent at 11/15/2021 11:14 AM EDT ----- Please call pt, no need for oral iron. Anemia is improving but kidney function slightly worse. I think he has a kidney specialist.  Thanks, Regan Rakers, NP

## 2021-11-20 NOTE — Telephone Encounter (Signed)
This nurse spoke with Nathaneil Canary and made aware of patients lab results and provider recommendations.  He acknowledged understanding. No further concerns or questions noted at this time.

## 2022-01-01 ENCOUNTER — Ambulatory Visit (HOSPITAL_COMMUNITY): Payer: Medicare HMO

## 2022-03-05 DIAGNOSIS — B351 Tinea unguium: Secondary | ICD-10-CM | POA: Diagnosis not present

## 2022-03-05 DIAGNOSIS — M79672 Pain in left foot: Secondary | ICD-10-CM | POA: Diagnosis not present

## 2022-03-05 DIAGNOSIS — M79671 Pain in right foot: Secondary | ICD-10-CM | POA: Diagnosis not present

## 2022-03-05 DIAGNOSIS — L600 Ingrowing nail: Secondary | ICD-10-CM | POA: Diagnosis not present

## 2022-03-13 ENCOUNTER — Telehealth: Payer: Self-pay | Admitting: Hematology

## 2022-03-13 ENCOUNTER — Other Ambulatory Visit: Payer: Self-pay

## 2022-03-13 DIAGNOSIS — C186 Malignant neoplasm of descending colon: Secondary | ICD-10-CM

## 2022-03-13 NOTE — Telephone Encounter (Signed)
Called to r/s December appointments to January. R/s and notified.

## 2022-03-14 ENCOUNTER — Other Ambulatory Visit: Payer: Medicare HMO

## 2022-03-14 ENCOUNTER — Ambulatory Visit: Payer: Medicare HMO | Admitting: Hematology

## 2022-03-27 DIAGNOSIS — I509 Heart failure, unspecified: Secondary | ICD-10-CM | POA: Diagnosis not present

## 2022-03-27 DIAGNOSIS — C189 Malignant neoplasm of colon, unspecified: Secondary | ICD-10-CM | POA: Diagnosis not present

## 2022-03-27 DIAGNOSIS — D518 Other vitamin B12 deficiency anemias: Secondary | ICD-10-CM | POA: Diagnosis not present

## 2022-03-27 DIAGNOSIS — E038 Other specified hypothyroidism: Secondary | ICD-10-CM | POA: Diagnosis not present

## 2022-03-27 DIAGNOSIS — E785 Hyperlipidemia, unspecified: Secondary | ICD-10-CM | POA: Diagnosis not present

## 2022-03-27 DIAGNOSIS — I4891 Unspecified atrial fibrillation: Secondary | ICD-10-CM | POA: Diagnosis not present

## 2022-03-27 DIAGNOSIS — E559 Vitamin D deficiency, unspecified: Secondary | ICD-10-CM | POA: Diagnosis not present

## 2022-03-27 DIAGNOSIS — M159 Polyosteoarthritis, unspecified: Secondary | ICD-10-CM | POA: Diagnosis not present

## 2022-03-27 DIAGNOSIS — E119 Type 2 diabetes mellitus without complications: Secondary | ICD-10-CM | POA: Diagnosis not present

## 2022-03-28 DIAGNOSIS — I10 Essential (primary) hypertension: Secondary | ICD-10-CM | POA: Diagnosis not present

## 2022-03-31 DIAGNOSIS — R2689 Other abnormalities of gait and mobility: Secondary | ICD-10-CM | POA: Diagnosis not present

## 2022-03-31 DIAGNOSIS — M6281 Muscle weakness (generalized): Secondary | ICD-10-CM | POA: Diagnosis not present

## 2022-04-02 DIAGNOSIS — R2689 Other abnormalities of gait and mobility: Secondary | ICD-10-CM | POA: Diagnosis not present

## 2022-04-02 DIAGNOSIS — M6281 Muscle weakness (generalized): Secondary | ICD-10-CM | POA: Diagnosis not present

## 2022-04-03 DIAGNOSIS — K219 Gastro-esophageal reflux disease without esophagitis: Secondary | ICD-10-CM | POA: Diagnosis not present

## 2022-04-03 DIAGNOSIS — E785 Hyperlipidemia, unspecified: Secondary | ICD-10-CM | POA: Diagnosis not present

## 2022-04-03 DIAGNOSIS — N189 Chronic kidney disease, unspecified: Secondary | ICD-10-CM | POA: Diagnosis not present

## 2022-04-03 DIAGNOSIS — E559 Vitamin D deficiency, unspecified: Secondary | ICD-10-CM | POA: Diagnosis not present

## 2022-04-03 DIAGNOSIS — M159 Polyosteoarthritis, unspecified: Secondary | ICD-10-CM | POA: Diagnosis not present

## 2022-04-03 DIAGNOSIS — I4891 Unspecified atrial fibrillation: Secondary | ICD-10-CM | POA: Diagnosis not present

## 2022-04-03 DIAGNOSIS — D649 Anemia, unspecified: Secondary | ICD-10-CM | POA: Diagnosis not present

## 2022-04-07 DIAGNOSIS — R2689 Other abnormalities of gait and mobility: Secondary | ICD-10-CM | POA: Diagnosis not present

## 2022-04-07 DIAGNOSIS — M6281 Muscle weakness (generalized): Secondary | ICD-10-CM | POA: Diagnosis not present

## 2022-04-09 DIAGNOSIS — M6281 Muscle weakness (generalized): Secondary | ICD-10-CM | POA: Diagnosis not present

## 2022-04-09 DIAGNOSIS — R2689 Other abnormalities of gait and mobility: Secondary | ICD-10-CM | POA: Diagnosis not present

## 2022-04-13 NOTE — Assessment & Plan Note (Deleted)
pT3N1M0 stage IIIB -Diagnosed in 11/2020 -he had right colectomy on 12/08/20 with Dr. Kieth Brightly. Pathology showed 5 cm tumor, margins not involved, one of 23 lymph nodes involved, and five tubular adenomas -due to his poor performance status, and stage IV kidney disease, he was not a candidate for adjuvant chemo -He is on cancer surveillance. -Last surveillance CT scan from February 2023 was negative for recurrence.

## 2022-04-14 ENCOUNTER — Inpatient Hospital Stay: Payer: Medicare HMO | Admitting: Hematology

## 2022-04-14 ENCOUNTER — Inpatient Hospital Stay: Payer: Medicare HMO

## 2022-04-14 DIAGNOSIS — C186 Malignant neoplasm of descending colon: Secondary | ICD-10-CM

## 2022-04-15 ENCOUNTER — Telehealth: Payer: Self-pay | Admitting: Hematology

## 2022-04-15 NOTE — Telephone Encounter (Signed)
Called patient per 1/15 in basket message. R/s patient and left voicemail with new appointment information.

## 2022-04-17 DIAGNOSIS — I4891 Unspecified atrial fibrillation: Secondary | ICD-10-CM | POA: Diagnosis not present

## 2022-04-17 DIAGNOSIS — N189 Chronic kidney disease, unspecified: Secondary | ICD-10-CM | POA: Diagnosis not present

## 2022-04-17 DIAGNOSIS — I509 Heart failure, unspecified: Secondary | ICD-10-CM | POA: Diagnosis not present

## 2022-04-17 DIAGNOSIS — M6281 Muscle weakness (generalized): Secondary | ICD-10-CM | POA: Diagnosis not present

## 2022-04-17 DIAGNOSIS — M109 Gout, unspecified: Secondary | ICD-10-CM | POA: Diagnosis not present

## 2022-04-17 DIAGNOSIS — R2689 Other abnormalities of gait and mobility: Secondary | ICD-10-CM | POA: Diagnosis not present

## 2022-04-17 DIAGNOSIS — K219 Gastro-esophageal reflux disease without esophagitis: Secondary | ICD-10-CM | POA: Diagnosis not present

## 2022-04-17 DIAGNOSIS — E785 Hyperlipidemia, unspecified: Secondary | ICD-10-CM | POA: Diagnosis not present

## 2022-04-17 DIAGNOSIS — R059 Cough, unspecified: Secondary | ICD-10-CM | POA: Diagnosis not present

## 2022-04-21 DIAGNOSIS — M6281 Muscle weakness (generalized): Secondary | ICD-10-CM | POA: Diagnosis not present

## 2022-04-21 DIAGNOSIS — R2689 Other abnormalities of gait and mobility: Secondary | ICD-10-CM | POA: Diagnosis not present

## 2022-04-23 DIAGNOSIS — D518 Other vitamin B12 deficiency anemias: Secondary | ICD-10-CM | POA: Diagnosis not present

## 2022-04-23 DIAGNOSIS — I4891 Unspecified atrial fibrillation: Secondary | ICD-10-CM | POA: Diagnosis not present

## 2022-04-23 DIAGNOSIS — M159 Polyosteoarthritis, unspecified: Secondary | ICD-10-CM | POA: Diagnosis not present

## 2022-04-23 DIAGNOSIS — E785 Hyperlipidemia, unspecified: Secondary | ICD-10-CM | POA: Diagnosis not present

## 2022-04-23 DIAGNOSIS — E038 Other specified hypothyroidism: Secondary | ICD-10-CM | POA: Diagnosis not present

## 2022-04-23 DIAGNOSIS — I509 Heart failure, unspecified: Secondary | ICD-10-CM | POA: Diagnosis not present

## 2022-04-23 DIAGNOSIS — E119 Type 2 diabetes mellitus without complications: Secondary | ICD-10-CM | POA: Diagnosis not present

## 2022-04-23 DIAGNOSIS — E559 Vitamin D deficiency, unspecified: Secondary | ICD-10-CM | POA: Diagnosis not present

## 2022-04-23 DIAGNOSIS — C189 Malignant neoplasm of colon, unspecified: Secondary | ICD-10-CM | POA: Diagnosis not present

## 2022-04-24 DIAGNOSIS — D518 Other vitamin B12 deficiency anemias: Secondary | ICD-10-CM | POA: Diagnosis not present

## 2022-04-24 DIAGNOSIS — E782 Mixed hyperlipidemia: Secondary | ICD-10-CM | POA: Diagnosis not present

## 2022-04-24 DIAGNOSIS — E038 Other specified hypothyroidism: Secondary | ICD-10-CM | POA: Diagnosis not present

## 2022-04-24 DIAGNOSIS — Z79899 Other long term (current) drug therapy: Secondary | ICD-10-CM | POA: Diagnosis not present

## 2022-04-24 DIAGNOSIS — E119 Type 2 diabetes mellitus without complications: Secondary | ICD-10-CM | POA: Diagnosis not present

## 2022-04-28 DIAGNOSIS — R2689 Other abnormalities of gait and mobility: Secondary | ICD-10-CM | POA: Diagnosis not present

## 2022-04-28 DIAGNOSIS — I10 Essential (primary) hypertension: Secondary | ICD-10-CM | POA: Diagnosis not present

## 2022-04-28 DIAGNOSIS — M6281 Muscle weakness (generalized): Secondary | ICD-10-CM | POA: Diagnosis not present

## 2022-04-30 DIAGNOSIS — R2689 Other abnormalities of gait and mobility: Secondary | ICD-10-CM | POA: Diagnosis not present

## 2022-04-30 DIAGNOSIS — M6281 Muscle weakness (generalized): Secondary | ICD-10-CM | POA: Diagnosis not present

## 2022-05-01 DIAGNOSIS — I4891 Unspecified atrial fibrillation: Secondary | ICD-10-CM | POA: Diagnosis not present

## 2022-05-01 DIAGNOSIS — I509 Heart failure, unspecified: Secondary | ICD-10-CM | POA: Diagnosis not present

## 2022-05-01 DIAGNOSIS — E538 Deficiency of other specified B group vitamins: Secondary | ICD-10-CM | POA: Diagnosis not present

## 2022-05-01 DIAGNOSIS — M109 Gout, unspecified: Secondary | ICD-10-CM | POA: Diagnosis not present

## 2022-05-01 DIAGNOSIS — K219 Gastro-esophageal reflux disease without esophagitis: Secondary | ICD-10-CM | POA: Diagnosis not present

## 2022-05-01 DIAGNOSIS — E785 Hyperlipidemia, unspecified: Secondary | ICD-10-CM | POA: Diagnosis not present

## 2022-05-01 DIAGNOSIS — E559 Vitamin D deficiency, unspecified: Secondary | ICD-10-CM | POA: Diagnosis not present

## 2022-05-01 DIAGNOSIS — N189 Chronic kidney disease, unspecified: Secondary | ICD-10-CM | POA: Diagnosis not present

## 2022-05-04 NOTE — Assessment & Plan Note (Deleted)
pT3N1M0 stage IIIB -Diagnosed in 11/2020 -he had right colectomy on 12/08/20 with Dr. Kieth Brightly. Pathology showed 5 cm tumor, margins not involved, one of 23 lymph nodes involved, and five tubular adenomas -liver MRI on 12/10/20 showed two probable liver cysts, not considered suspicious for metastatic disease. -due to his poor performance status, and stage IV kidney disease, he was not a candidate for adjuvant chemo. He is still in rehab, overall condition has improved some.   -surveillance CT CAP on 05/28/21 showed postoperative changes, no convincing evidence of residual/metastatic disease

## 2022-05-05 ENCOUNTER — Telehealth: Payer: Self-pay | Admitting: Hematology

## 2022-05-05 ENCOUNTER — Other Ambulatory Visit: Payer: Self-pay

## 2022-05-05 ENCOUNTER — Inpatient Hospital Stay: Payer: Medicare HMO

## 2022-05-05 ENCOUNTER — Telehealth: Payer: Self-pay

## 2022-05-05 ENCOUNTER — Inpatient Hospital Stay: Payer: Medicare HMO | Admitting: Hematology

## 2022-05-05 DIAGNOSIS — C186 Malignant neoplasm of descending colon: Secondary | ICD-10-CM

## 2022-05-05 NOTE — Telephone Encounter (Signed)
R/s per 2/5 IB called 3x and left 2 VM at [employer].

## 2022-05-05 NOTE — Telephone Encounter (Addendum)
I have tried to call patient and all of his contacts to let him know we got the approval for the CT scan. Their was no answer at any of his contacts. I have tried multiple times with no answer. He did receive PA for the CT Scan.      ----- Message from Evalee Jefferson, RN sent at 05/05/2022  9:00 AM EST ----- Regarding: CT AP wo Contrast Lacie has ordered for this pt to have a CT Scan.  Does he have financial clearance for this procedure from his insurance?  Please advise.  Thanks!

## 2022-05-06 DIAGNOSIS — R2689 Other abnormalities of gait and mobility: Secondary | ICD-10-CM | POA: Diagnosis not present

## 2022-05-06 DIAGNOSIS — M6281 Muscle weakness (generalized): Secondary | ICD-10-CM | POA: Diagnosis not present

## 2022-05-07 DIAGNOSIS — M6281 Muscle weakness (generalized): Secondary | ICD-10-CM | POA: Diagnosis not present

## 2022-05-07 DIAGNOSIS — R2689 Other abnormalities of gait and mobility: Secondary | ICD-10-CM | POA: Diagnosis not present

## 2022-05-12 DIAGNOSIS — M6281 Muscle weakness (generalized): Secondary | ICD-10-CM | POA: Diagnosis not present

## 2022-05-12 DIAGNOSIS — R2689 Other abnormalities of gait and mobility: Secondary | ICD-10-CM | POA: Diagnosis not present

## 2022-05-14 DIAGNOSIS — R2689 Other abnormalities of gait and mobility: Secondary | ICD-10-CM | POA: Diagnosis not present

## 2022-05-14 DIAGNOSIS — M6281 Muscle weakness (generalized): Secondary | ICD-10-CM | POA: Diagnosis not present

## 2022-05-17 DIAGNOSIS — C189 Malignant neoplasm of colon, unspecified: Secondary | ICD-10-CM | POA: Diagnosis not present

## 2022-05-17 DIAGNOSIS — E038 Other specified hypothyroidism: Secondary | ICD-10-CM | POA: Diagnosis not present

## 2022-05-17 DIAGNOSIS — I509 Heart failure, unspecified: Secondary | ICD-10-CM | POA: Diagnosis not present

## 2022-05-17 DIAGNOSIS — E559 Vitamin D deficiency, unspecified: Secondary | ICD-10-CM | POA: Diagnosis not present

## 2022-05-17 DIAGNOSIS — E785 Hyperlipidemia, unspecified: Secondary | ICD-10-CM | POA: Diagnosis not present

## 2022-05-17 DIAGNOSIS — D518 Other vitamin B12 deficiency anemias: Secondary | ICD-10-CM | POA: Diagnosis not present

## 2022-05-17 DIAGNOSIS — E119 Type 2 diabetes mellitus without complications: Secondary | ICD-10-CM | POA: Diagnosis not present

## 2022-05-17 DIAGNOSIS — M159 Polyosteoarthritis, unspecified: Secondary | ICD-10-CM | POA: Diagnosis not present

## 2022-05-17 DIAGNOSIS — I4891 Unspecified atrial fibrillation: Secondary | ICD-10-CM | POA: Diagnosis not present

## 2022-05-19 DIAGNOSIS — M6281 Muscle weakness (generalized): Secondary | ICD-10-CM | POA: Diagnosis not present

## 2022-05-19 DIAGNOSIS — R2689 Other abnormalities of gait and mobility: Secondary | ICD-10-CM | POA: Diagnosis not present

## 2022-05-21 DIAGNOSIS — E669 Obesity, unspecified: Secondary | ICD-10-CM | POA: Diagnosis not present

## 2022-05-21 DIAGNOSIS — G3184 Mild cognitive impairment, so stated: Secondary | ICD-10-CM | POA: Diagnosis not present

## 2022-05-21 DIAGNOSIS — F411 Generalized anxiety disorder: Secondary | ICD-10-CM | POA: Diagnosis not present

## 2022-05-22 ENCOUNTER — Ambulatory Visit (HOSPITAL_COMMUNITY): Payer: Medicare HMO

## 2022-05-23 DIAGNOSIS — M6281 Muscle weakness (generalized): Secondary | ICD-10-CM | POA: Diagnosis not present

## 2022-05-23 DIAGNOSIS — R2689 Other abnormalities of gait and mobility: Secondary | ICD-10-CM | POA: Diagnosis not present

## 2022-05-26 ENCOUNTER — Other Ambulatory Visit: Payer: Medicare HMO

## 2022-05-26 ENCOUNTER — Ambulatory Visit: Payer: Medicare HMO | Admitting: Hematology

## 2022-05-27 DIAGNOSIS — M6281 Muscle weakness (generalized): Secondary | ICD-10-CM | POA: Diagnosis not present

## 2022-05-27 DIAGNOSIS — R2689 Other abnormalities of gait and mobility: Secondary | ICD-10-CM | POA: Diagnosis not present

## 2022-05-29 DIAGNOSIS — N2581 Secondary hyperparathyroidism of renal origin: Secondary | ICD-10-CM | POA: Diagnosis not present

## 2022-05-29 DIAGNOSIS — E559 Vitamin D deficiency, unspecified: Secondary | ICD-10-CM | POA: Diagnosis not present

## 2022-05-29 DIAGNOSIS — E538 Deficiency of other specified B group vitamins: Secondary | ICD-10-CM | POA: Diagnosis not present

## 2022-05-29 DIAGNOSIS — I4891 Unspecified atrial fibrillation: Secondary | ICD-10-CM | POA: Diagnosis not present

## 2022-05-29 DIAGNOSIS — E785 Hyperlipidemia, unspecified: Secondary | ICD-10-CM | POA: Diagnosis not present

## 2022-05-29 DIAGNOSIS — I10 Essential (primary) hypertension: Secondary | ICD-10-CM | POA: Diagnosis not present

## 2022-05-29 DIAGNOSIS — N189 Chronic kidney disease, unspecified: Secondary | ICD-10-CM | POA: Diagnosis not present

## 2022-05-29 DIAGNOSIS — K219 Gastro-esophageal reflux disease without esophagitis: Secondary | ICD-10-CM | POA: Diagnosis not present

## 2022-05-29 DIAGNOSIS — N184 Chronic kidney disease, stage 4 (severe): Secondary | ICD-10-CM | POA: Diagnosis not present

## 2022-05-29 DIAGNOSIS — I129 Hypertensive chronic kidney disease with stage 1 through stage 4 chronic kidney disease, or unspecified chronic kidney disease: Secondary | ICD-10-CM | POA: Diagnosis not present

## 2022-05-29 DIAGNOSIS — M109 Gout, unspecified: Secondary | ICD-10-CM | POA: Diagnosis not present

## 2022-05-29 DIAGNOSIS — E876 Hypokalemia: Secondary | ICD-10-CM | POA: Diagnosis not present

## 2022-05-29 DIAGNOSIS — R600 Localized edema: Secondary | ICD-10-CM | POA: Diagnosis not present

## 2022-05-29 DIAGNOSIS — I509 Heart failure, unspecified: Secondary | ICD-10-CM | POA: Diagnosis not present

## 2022-05-29 DIAGNOSIS — R2689 Other abnormalities of gait and mobility: Secondary | ICD-10-CM | POA: Diagnosis not present

## 2022-05-29 DIAGNOSIS — M6281 Muscle weakness (generalized): Secondary | ICD-10-CM | POA: Diagnosis not present

## 2022-05-30 ENCOUNTER — Ambulatory Visit (HOSPITAL_BASED_OUTPATIENT_CLINIC_OR_DEPARTMENT_OTHER)
Admission: RE | Admit: 2022-05-30 | Discharge: 2022-05-30 | Disposition: A | Discharge status: Home / Self Care | Payer: Medicare HMO | Source: Ambulatory Visit | Attending: Nurse Practitioner | Admitting: Nurse Practitioner

## 2022-05-30 DIAGNOSIS — K7689 Other specified diseases of liver: Secondary | ICD-10-CM | POA: Diagnosis not present

## 2022-05-30 DIAGNOSIS — C186 Malignant neoplasm of descending colon: Secondary | ICD-10-CM | POA: Diagnosis not present

## 2022-05-30 DIAGNOSIS — K5730 Diverticulosis of large intestine without perforation or abscess without bleeding: Secondary | ICD-10-CM | POA: Diagnosis not present

## 2022-06-02 DIAGNOSIS — R2689 Other abnormalities of gait and mobility: Secondary | ICD-10-CM | POA: Diagnosis not present

## 2022-06-02 DIAGNOSIS — M6281 Muscle weakness (generalized): Secondary | ICD-10-CM | POA: Diagnosis not present

## 2022-06-04 DIAGNOSIS — M6281 Muscle weakness (generalized): Secondary | ICD-10-CM | POA: Diagnosis not present

## 2022-06-04 DIAGNOSIS — R2689 Other abnormalities of gait and mobility: Secondary | ICD-10-CM | POA: Diagnosis not present

## 2022-06-08 NOTE — Assessment & Plan Note (Deleted)
pT3N1M0 stage IIIB -Diagnosed in 11/2020 -he had right colectomy on 12/08/20 with Dr. Kieth Brightly. Pathology showed 5 cm tumor, margins not involved, one of 23 lymph nodes involved, and five tubular adenomas -liver MRI on 12/10/20 showed two probable liver cysts, not considered suspicious for metastatic disease. -due to his poor performance status, and stage IV kidney disease, he was not a candidate for adjuvant chemo. He is still in rehab, overall condition has improved some.   -surveillance CT CAP on 06/02/2022 showed no evidence of recurrence, it showed colonic diverticulosis with progressive short segment wall thickening of the distal descending/proximal sigmoid colon, I recommend him to have colonoscopy which he is overdue, last one in 2022 with Dr. Collene Mares and one year follow up was recommended

## 2022-06-09 ENCOUNTER — Inpatient Hospital Stay: Payer: Medicare HMO | Attending: Hematology | Admitting: Hematology

## 2022-06-09 ENCOUNTER — Inpatient Hospital Stay: Payer: Medicare HMO

## 2022-06-09 DIAGNOSIS — C186 Malignant neoplasm of descending colon: Secondary | ICD-10-CM

## 2022-06-10 DIAGNOSIS — R2689 Other abnormalities of gait and mobility: Secondary | ICD-10-CM | POA: Diagnosis not present

## 2022-06-10 DIAGNOSIS — M6281 Muscle weakness (generalized): Secondary | ICD-10-CM | POA: Diagnosis not present

## 2022-06-11 DIAGNOSIS — M6281 Muscle weakness (generalized): Secondary | ICD-10-CM | POA: Diagnosis not present

## 2022-06-11 DIAGNOSIS — R2689 Other abnormalities of gait and mobility: Secondary | ICD-10-CM | POA: Diagnosis not present

## 2022-06-16 DIAGNOSIS — R221 Localized swelling, mass and lump, neck: Secondary | ICD-10-CM | POA: Diagnosis not present

## 2022-06-16 DIAGNOSIS — E042 Nontoxic multinodular goiter: Secondary | ICD-10-CM | POA: Diagnosis not present

## 2022-06-17 DIAGNOSIS — R2689 Other abnormalities of gait and mobility: Secondary | ICD-10-CM | POA: Diagnosis not present

## 2022-06-17 DIAGNOSIS — M6281 Muscle weakness (generalized): Secondary | ICD-10-CM | POA: Diagnosis not present

## 2022-06-18 ENCOUNTER — Telehealth: Payer: Self-pay | Admitting: Hematology

## 2022-06-18 DIAGNOSIS — R2689 Other abnormalities of gait and mobility: Secondary | ICD-10-CM | POA: Diagnosis not present

## 2022-06-18 DIAGNOSIS — M6281 Muscle weakness (generalized): Secondary | ICD-10-CM | POA: Diagnosis not present

## 2022-06-18 NOTE — Telephone Encounter (Signed)
Per 3/20 IB reached out to reschedule patient per number on message

## 2022-06-18 NOTE — Telephone Encounter (Signed)
Called patient to reschedule missed appointment. Patient advised that they will call back to reschedule.

## 2022-06-19 DIAGNOSIS — E038 Other specified hypothyroidism: Secondary | ICD-10-CM | POA: Diagnosis not present

## 2022-06-19 DIAGNOSIS — I4891 Unspecified atrial fibrillation: Secondary | ICD-10-CM | POA: Diagnosis not present

## 2022-06-19 DIAGNOSIS — E119 Type 2 diabetes mellitus without complications: Secondary | ICD-10-CM | POA: Diagnosis not present

## 2022-06-19 DIAGNOSIS — E785 Hyperlipidemia, unspecified: Secondary | ICD-10-CM | POA: Diagnosis not present

## 2022-06-19 DIAGNOSIS — E559 Vitamin D deficiency, unspecified: Secondary | ICD-10-CM | POA: Diagnosis not present

## 2022-06-19 DIAGNOSIS — E7849 Other hyperlipidemia: Secondary | ICD-10-CM | POA: Diagnosis not present

## 2022-06-19 DIAGNOSIS — C189 Malignant neoplasm of colon, unspecified: Secondary | ICD-10-CM | POA: Diagnosis not present

## 2022-06-19 DIAGNOSIS — D518 Other vitamin B12 deficiency anemias: Secondary | ICD-10-CM | POA: Diagnosis not present

## 2022-06-19 DIAGNOSIS — I509 Heart failure, unspecified: Secondary | ICD-10-CM | POA: Diagnosis not present

## 2022-06-23 DIAGNOSIS — R2689 Other abnormalities of gait and mobility: Secondary | ICD-10-CM | POA: Diagnosis not present

## 2022-06-23 DIAGNOSIS — M6281 Muscle weakness (generalized): Secondary | ICD-10-CM | POA: Diagnosis not present

## 2022-06-23 DIAGNOSIS — R590 Localized enlarged lymph nodes: Secondary | ICD-10-CM | POA: Diagnosis not present

## 2022-06-24 DIAGNOSIS — R011 Cardiac murmur, unspecified: Secondary | ICD-10-CM | POA: Diagnosis not present

## 2022-06-25 DIAGNOSIS — R2242 Localized swelling, mass and lump, left lower limb: Secondary | ICD-10-CM | POA: Diagnosis not present

## 2022-06-26 DIAGNOSIS — M6281 Muscle weakness (generalized): Secondary | ICD-10-CM | POA: Diagnosis not present

## 2022-06-26 DIAGNOSIS — R2689 Other abnormalities of gait and mobility: Secondary | ICD-10-CM | POA: Diagnosis not present

## 2022-06-30 DIAGNOSIS — I70223 Atherosclerosis of native arteries of extremities with rest pain, bilateral legs: Secondary | ICD-10-CM | POA: Diagnosis not present

## 2022-07-02 DIAGNOSIS — I6523 Occlusion and stenosis of bilateral carotid arteries: Secondary | ICD-10-CM | POA: Diagnosis not present

## 2022-07-02 DIAGNOSIS — R0989 Other specified symptoms and signs involving the circulatory and respiratory systems: Secondary | ICD-10-CM | POA: Diagnosis not present

## 2022-07-03 DIAGNOSIS — M6281 Muscle weakness (generalized): Secondary | ICD-10-CM | POA: Diagnosis not present

## 2022-07-03 DIAGNOSIS — R2689 Other abnormalities of gait and mobility: Secondary | ICD-10-CM | POA: Diagnosis not present

## 2022-07-04 DIAGNOSIS — I77811 Abdominal aortic ectasia: Secondary | ICD-10-CM | POA: Diagnosis not present

## 2022-07-07 DIAGNOSIS — R2689 Other abnormalities of gait and mobility: Secondary | ICD-10-CM | POA: Diagnosis not present

## 2022-07-07 DIAGNOSIS — M6281 Muscle weakness (generalized): Secondary | ICD-10-CM | POA: Diagnosis not present

## 2022-07-08 DIAGNOSIS — M159 Polyosteoarthritis, unspecified: Secondary | ICD-10-CM | POA: Diagnosis not present

## 2022-07-08 DIAGNOSIS — E538 Deficiency of other specified B group vitamins: Secondary | ICD-10-CM | POA: Diagnosis not present

## 2022-07-08 DIAGNOSIS — I509 Heart failure, unspecified: Secondary | ICD-10-CM | POA: Diagnosis not present

## 2022-07-08 DIAGNOSIS — K219 Gastro-esophageal reflux disease without esophagitis: Secondary | ICD-10-CM | POA: Diagnosis not present

## 2022-07-08 DIAGNOSIS — E785 Hyperlipidemia, unspecified: Secondary | ICD-10-CM | POA: Diagnosis not present

## 2022-07-08 DIAGNOSIS — N189 Chronic kidney disease, unspecified: Secondary | ICD-10-CM | POA: Diagnosis not present

## 2022-07-08 DIAGNOSIS — E559 Vitamin D deficiency, unspecified: Secondary | ICD-10-CM | POA: Diagnosis not present

## 2022-07-09 DIAGNOSIS — R2689 Other abnormalities of gait and mobility: Secondary | ICD-10-CM | POA: Diagnosis not present

## 2022-07-09 DIAGNOSIS — M6281 Muscle weakness (generalized): Secondary | ICD-10-CM | POA: Diagnosis not present

## 2022-07-10 DIAGNOSIS — E119 Type 2 diabetes mellitus without complications: Secondary | ICD-10-CM | POA: Diagnosis not present

## 2022-07-10 DIAGNOSIS — C189 Malignant neoplasm of colon, unspecified: Secondary | ICD-10-CM | POA: Diagnosis not present

## 2022-07-10 DIAGNOSIS — D518 Other vitamin B12 deficiency anemias: Secondary | ICD-10-CM | POA: Diagnosis not present

## 2022-07-10 DIAGNOSIS — I509 Heart failure, unspecified: Secondary | ICD-10-CM | POA: Diagnosis not present

## 2022-07-10 DIAGNOSIS — E038 Other specified hypothyroidism: Secondary | ICD-10-CM | POA: Diagnosis not present

## 2022-07-10 DIAGNOSIS — I4891 Unspecified atrial fibrillation: Secondary | ICD-10-CM | POA: Diagnosis not present

## 2022-07-10 DIAGNOSIS — E785 Hyperlipidemia, unspecified: Secondary | ICD-10-CM | POA: Diagnosis not present

## 2022-07-10 DIAGNOSIS — E559 Vitamin D deficiency, unspecified: Secondary | ICD-10-CM | POA: Diagnosis not present

## 2022-07-10 DIAGNOSIS — E7849 Other hyperlipidemia: Secondary | ICD-10-CM | POA: Diagnosis not present

## 2022-07-14 ENCOUNTER — Other Ambulatory Visit: Payer: Self-pay | Admitting: Nurse Practitioner

## 2022-07-14 DIAGNOSIS — R2689 Other abnormalities of gait and mobility: Secondary | ICD-10-CM | POA: Diagnosis not present

## 2022-07-14 DIAGNOSIS — C186 Malignant neoplasm of descending colon: Secondary | ICD-10-CM

## 2022-07-14 DIAGNOSIS — M6281 Muscle weakness (generalized): Secondary | ICD-10-CM | POA: Diagnosis not present

## 2022-07-14 NOTE — Progress Notes (Deleted)
Patient Care Team: [name], Fanny Dance, MD as PCP - General (Family Medicine) Jake Bathe, MD as PCP - Cardiology (Cardiology) Charna Elizabeth, MD as Consulting Physician (Gastroenterology)   CHIEF COMPLAINT: Follow up stage III colon cancer   Oncology History Overview Note  Cancer Staging Cancer of left colon [facility]) Staging form: Colon and Rectum, AJCC 8th Edition - Pathologic stage from 12/08/2020: Stage IIIB (pT3, pN1a, cM0) - Signed by Malachy Mood, MD on 12/11/2020    Cancer of left colon  12/05/2020 Procedure   Colonoscopy, under Dr. Loreta Ave  Impression: - Preparation of the colon was extremely poor inspite of aggressive lavage. - Diverticulosis in the sigmoid colon. - Likely malignant 5 cm tumor in the proximal descending colon-biopsied; mucosa 5 cm distal to the mass was injected with 3 cc's of [place] ink. - One 10 mm polyp in the cecum, removed with a hot snare x 1; resected and retrieved. - Moderate sized internal hemorrhoids.   12/05/2020 Pathology Results   FINAL MICROSCOPIC DIAGNOSIS:   A. COLON, CECAL, POLYPECTOMY:  - Tubular adenoma  - Negative for high-grade dysplasia or malignancy   B. COLON MASS, DESCENDING, BIOPSY:  - Invasive moderately differentiated adenocarcinoma, see comment    ADDENDUM: Mismatch Repair Protein (IHC)  SUMMARY INTERPRETATION: NORMAL    12/06/2020 Imaging   CT CAP  IMPRESSION: 1. Short segment of colonic wall thickening involving the splenic flexure likely reflects patient's presumed colonic malignancy. 2. Single 1.3 cm lymph node posterior to the colonic lesion likely reflecting local nodal involvement. No additional pathologically enlarged abdominal or pelvic lymph nodes to suggest distant nodal metastases. 3. Two subcentimeter hypodense hepatic lesions, incompletely characterized on this examination. Possibly reflecting benign hepatic cysts but cystic hepatic metastases not excluded on this study. Consider attention on close  interval follow-up imaging versus further evaluation with hepatic protocol MRI with and without contrast, preferably as an outpatient when patient is stable and able to follow commands. 4. Healing nonpathologic left lateral eighth and ninth rib fractures. 5. Aortic Atherosclerosis (ICD10-I70.0).   12/08/2020 Cancer Staging   Staging form: Colon and Rectum, AJCC 8th Edition - Pathologic stage from 12/08/2020: Stage IIIB (pT3, pN1a, cM0) - Signed by Malachy Mood, MD on 12/11/2020 Stage prefix: Initial diagnosis Total positive nodes: 1 Histologic grading system: 4 grade system Histologic grade (G): G2 Residual tumor (R): R0 - None   12/08/2020 Pathology Results   FINAL MICROSCOPIC DIAGNOSIS:   A. COLON, RIGHT TRANSVERSE, COLECTOMY:  - Invasive moderately differentiated colorectal adenocarcinoma, 5 cm.  - Margins not involved.  - Metastatic carcinoma in one of twenty-three lymph nodes (1/23).  - Five tubular adenomas.  - Benign appendix.  - See oncology table.   ADDENDUM:  Mismatch Repair Protein (IHC)  SUMMARY INTERPRETATION: NORMAL    12/10/2020 Initial Diagnosis   Cancer of left colon (HCC)   05/28/2021 Imaging   EXAM: CT CHEST, ABDOMEN AND PELVIS WITHOUT CONTRAST  IMPRESSION: 1. Prior right hemicolectomy with ileocolonic anastomosis. Prominent lymph nodes and stranding in the mesentery extending to the ileocolonic anastomosis in the left upper quadrant, likely reflecting postoperative change/inflammation. Close attention on follow-up studies is suggested. 2. No convincing evidence of residual/metastatic disease in the chest, abdomen or pelvis. 3. Short segment colonic wall thickening of a portion of decompressed sigmoid colon is new from prior and favored to reflect underdistention. Attention on follow-up studies is recommended. 4. Punctate nonobstructive right lower pole renal stone. 5.  Aortic Atherosclerosis (ICD10-I70.0).      CURRENT  THERAPY: Surveillance   INTERVAL  HISTORY [name] returns for follow up as scheduled. Last seen by me 11/12/21. He underwent surveillance scan in March, could not come to appt to review results and it was rescheduled to today.   ROS   Past Medical History:  Diagnosis Date   Arthritis    severe arthritis and deformity of his knee's   Chest pain    Chronic atrial fibrillation (HCC)    Chronic renal disease, stage III (HCC)    Chronic venous stasis dermatitis    colon ca 10/2020   Edema    Gout    History of cardiovascular stress test 01/17/2008   EF- 62%   Hypercholesterolemia    Hyperlipidemia    Hypertension    Kidney stones    Long term (current) use of anticoagulants    Long-term (current) use of anticoagulants    Obesity    Osteoarthritis    PAF (paroxysmal atrial fibrillation) (HCC)    Paroxysmal atrial fibrillation (HCC)    Secondary hypercoagulable state [facility])      Past Surgical History:  Procedure Laterality Date   BIOPSY  12/05/2020   Procedure: BIOPSY;  Surgeon: Charna Elizabeth, MD;  Location: [facility] ENDOSCOPY;  Service: Endoscopy;;   CARDIOLITE STRESS TEST     EF 62%   COLONOSCOPY N/A 12/05/2020   Procedure: COLONOSCOPY;  Surgeon: Charna Elizabeth, MD;  Location: [facility] ENDOSCOPY;  Service: Endoscopy;  Laterality: N/A;   ESOPHAGOGASTRODUODENOSCOPY (EGD) WITH PROPOFOL N/A 12/05/2020   Procedure: ESOPHAGOGASTRODUODENOSCOPY (EGD) WITH PROPOFOL;  Surgeon: Charna Elizabeth, MD;  Location: [facility] ENDOSCOPY;  Service: Endoscopy;  Laterality: N/A;   LAPAROSCOPIC LOW ANTERIOR RESCECTION WITH COLOANAL ANASTOMOSIS  12/08/2020   Procedure: LAPAROSCOPIC LOW ANTERIOR RESCECTION WITH COLOANAL ANASTOMOSIS;  Surgeon: Sheliah Hatch, De Blanch, MD;  Location: [facility] OR;  Service: General;;   LAPAROSCOPIC PARTIAL COLECTOMY N/A 12/08/2020   Procedure: LAPAROSCOPIC ASSISTED POSSIBLE OPEN PARTIAL COLECTOMY WITH POSSIBLE OSTOMY;  Surgeon: Sheliah Hatch De Blanch, MD;  Location: [facility] OR;  Service: General;  Laterality: N/A;   POLYPECTOMY  12/05/2020   Procedure:  POLYPECTOMY;  Surgeon: Charna Elizabeth, MD;  Location: [facility] ENDOSCOPY;  Service: Endoscopy;;   SUBMUCOSAL TATTOO INJECTION  12/05/2020   Procedure: SUBMUCOSAL TATTOO INJECTION;  Surgeon: Charna Elizabeth, MD;  Location: [facility] ENDOSCOPY;  Service: Endoscopy;;     Outpatient Encounter Medications as of 07/15/2022  Medication Sig   acetaminophen (TYLENOL) 325 MG tablet Take 325 mg by mouth every 6 (six) hours as needed (pain).    allopurinol (ZYLOPRIM) 100 MG tablet Take 100 mg by mouth daily.   amiodarone (PACERONE) 100 MG tablet Take 100 mg by mouth daily.   ammonium lactate (LAC-HYDRIN) 12 % lotion Apply 1 application topically 2 (two) times daily.   apixaban (ELIQUIS) 5 MG TABS tablet Take 1 tablet (5 mg total) by mouth 2 (two) times daily.   atorvastatin (LIPITOR) 20 MG tablet Take 20 mg by mouth Daily.   calcitRIOL (ROCALTROL) 0.5 MCG capsule Take 0.5 mcg daily by mouth.    furosemide (LASIX) 20 MG tablet TAKE 2 TABLETS BY MOUTH TWICE DAILY (Patient taking differently: Take 40 mg by mouth 2 (two) times daily.)   K Phos Mono-Sod Phos Di & Mono (VIRT-PHOS 250 NEUTRAL) [phone] MG TABS Take 1 tablet by mouth 2 (two) times a day.    Nutritional Supplements (,FEEDING SUPPLEMENT, PROSOURCE PLUS) liquid Take 30 mLs by mouth 3 (three) times daily between meals.   ondansetron (ZOFRAN) 4 MG tablet Take 4 mg by mouth  daily.   pantoprazole (PROTONIX) 40 MG tablet Take 1 tablet (40 mg total) by mouth daily.   potassium chloride SA (KLOR-CON) 20 MEQ tablet TAKE 1 TABLET BY MOUTH DAILY (Patient taking differently: Take 20 mEq by mouth daily. TAKE 1 TABLET BY MOUTH DAILY)   traMADol (ULTRAM) 50 MG tablet Take 1 tablet by mouth every 6 (six) hours as needed.   No facility-administered encounter medications on file as of 07/15/2022.     There were no vitals filed for this visit. There is no height or weight on file to calculate BMI.   PHYSICAL EXAM GENERAL:alert, no distress and comfortable SKIN: no rash  EYES:  sclera clear NECK: without mass LYMPH:  no palpable cervical or supraclavicular lymphadenopathy  LUNGS: clear with normal breathing effort HEART: regular rate & rhythm, no lower extremity edema ABDOMEN: abdomen soft, non-tender and normal bowel sounds NEURO: alert & oriented x 3 with fluent speech, no focal motor/sensory deficits Breast exam:  PAC without erythema    CBC    Component Value Date/Time   WBC 7.1 11/12/2021 1016   RBC 3.38 (L) 11/12/2021 1016   HGB 11.2 (L) 11/12/2021 1016   HGB 10.5 (L) 05/28/2021 1138   HGB 13.1 06/12/2020 1126   HCT 34.4 (L) 11/12/2021 1016   HCT 37.6 06/12/2020 1126   PLT 314 11/12/2021 1016   PLT 295 05/28/2021 1138   PLT 289 06/12/2020 1126   MCV 101.8 (H) 11/12/2021 1016   MCV 98 (H) 06/12/2020 1126   MCH 33.1 11/12/2021 1016   MCHC 32.6 11/12/2021 1016   RDW 13.3 11/12/2021 1016   RDW 12.6 06/12/2020 1126   LYMPHSABS 2.1 11/12/2021 1016   MONOABS 0.3 11/12/2021 1016   EOSABS 0.4 11/12/2021 1016   BASOSABS 0.1 11/12/2021 1016     CMP     Component Value Date/Time   NA 141 11/12/2021 1016   NA 142 06/12/2020 1126   K 3.8 11/12/2021 1016   CL 110 11/12/2021 1016   CO2 22 11/12/2021 1016   GLUCOSE 107 (H) 11/12/2021 1016   BUN 38 (H) 11/12/2021 1016   BUN 18 06/12/2020 1126   CREATININE 2.77 (H) 11/12/2021 1016   CREATININE 2.36 (H) 05/28/2021 1138   CREATININE 1.79 (H) 01/01/2015 1234   CALCIUM 9.4 11/12/2021 1016   PROT 6.7 11/12/2021 1016   PROT 6.5 06/12/2020 1126   ALBUMIN 3.3 (L) 11/12/2021 1016   ALBUMIN 3.9 06/12/2020 1126   AST 13 (L) 11/12/2021 1016   AST 9 (L) 05/28/2021 1138   ALT 7 11/12/2021 1016   ALT <5 05/28/2021 1138   ALKPHOS 101 11/12/2021 1016   BILITOT 0.8 11/12/2021 1016   BILITOT 0.9 05/28/2021 1138   GFRNONAA 23 (L) 11/12/2021 1016   GFRNONAA 28 (L) 05/28/2021 1138   GFRAA 38 (L) 12/21/2019 1155     ASSESSMENT & PLAN:  PLAN:  No orders of the defined types were placed in this encounter.      All questions were answered. The patient knows to call the clinic with any problems, questions or concerns. No barriers to learning were detected. I spent *** counseling the patient face to face. The total time spent in the appointment was *** and more than 50% was on counseling, review of test results, and coordination of care.   Bradley Glad, NP-C @DATE @

## 2022-07-15 ENCOUNTER — Inpatient Hospital Stay: Payer: Medicare HMO | Admitting: Nurse Practitioner

## 2022-07-15 ENCOUNTER — Inpatient Hospital Stay: Payer: Medicare HMO

## 2022-07-16 DIAGNOSIS — M6281 Muscle weakness (generalized): Secondary | ICD-10-CM | POA: Diagnosis not present

## 2022-07-16 DIAGNOSIS — R2689 Other abnormalities of gait and mobility: Secondary | ICD-10-CM | POA: Diagnosis not present

## 2022-07-17 DIAGNOSIS — D518 Other vitamin B12 deficiency anemias: Secondary | ICD-10-CM | POA: Diagnosis not present

## 2022-07-17 DIAGNOSIS — E782 Mixed hyperlipidemia: Secondary | ICD-10-CM | POA: Diagnosis not present

## 2022-07-17 DIAGNOSIS — E038 Other specified hypothyroidism: Secondary | ICD-10-CM | POA: Diagnosis not present

## 2022-07-17 DIAGNOSIS — E119 Type 2 diabetes mellitus without complications: Secondary | ICD-10-CM | POA: Diagnosis not present

## 2022-07-17 DIAGNOSIS — Z79899 Other long term (current) drug therapy: Secondary | ICD-10-CM | POA: Diagnosis not present

## 2022-07-22 DIAGNOSIS — M6281 Muscle weakness (generalized): Secondary | ICD-10-CM | POA: Diagnosis not present

## 2022-07-22 DIAGNOSIS — R2689 Other abnormalities of gait and mobility: Secondary | ICD-10-CM | POA: Diagnosis not present

## 2022-07-23 DIAGNOSIS — M6281 Muscle weakness (generalized): Secondary | ICD-10-CM | POA: Diagnosis not present

## 2022-07-23 DIAGNOSIS — R2689 Other abnormalities of gait and mobility: Secondary | ICD-10-CM | POA: Diagnosis not present

## 2022-07-24 ENCOUNTER — Telehealth: Payer: Self-pay

## 2022-07-24 ENCOUNTER — Inpatient Hospital Stay: Payer: Medicare HMO | Attending: Hematology

## 2022-07-24 ENCOUNTER — Inpatient Hospital Stay: Payer: Medicare HMO | Admitting: Nurse Practitioner

## 2022-07-24 ENCOUNTER — Encounter: Payer: Self-pay | Admitting: Nurse Practitioner

## 2022-07-24 VITALS — BP 156/65 | HR 64 | Temp 97.7°F | Resp 13

## 2022-07-24 DIAGNOSIS — I129 Hypertensive chronic kidney disease with stage 1 through stage 4 chronic kidney disease, or unspecified chronic kidney disease: Secondary | ICD-10-CM | POA: Diagnosis not present

## 2022-07-24 DIAGNOSIS — C186 Malignant neoplasm of descending colon: Secondary | ICD-10-CM

## 2022-07-24 DIAGNOSIS — D631 Anemia in chronic kidney disease: Secondary | ICD-10-CM | POA: Insufficient documentation

## 2022-07-24 DIAGNOSIS — N184 Chronic kidney disease, stage 4 (severe): Secondary | ICD-10-CM | POA: Insufficient documentation

## 2022-07-24 DIAGNOSIS — M25569 Pain in unspecified knee: Secondary | ICD-10-CM | POA: Diagnosis not present

## 2022-07-24 DIAGNOSIS — I4891 Unspecified atrial fibrillation: Secondary | ICD-10-CM | POA: Diagnosis not present

## 2022-07-24 DIAGNOSIS — Z85038 Personal history of other malignant neoplasm of large intestine: Secondary | ICD-10-CM | POA: Diagnosis not present

## 2022-07-24 DIAGNOSIS — E875 Hyperkalemia: Secondary | ICD-10-CM | POA: Diagnosis not present

## 2022-07-24 LAB — CBC WITH DIFFERENTIAL (CANCER CENTER ONLY)
Abs Immature Granulocytes: 0.02 10*3/uL (ref 0.00–0.07)
Basophils Absolute: 0.1 10*3/uL (ref 0.0–0.1)
Basophils Relative: 1 %
Eosinophils Absolute: 0.5 10*3/uL (ref 0.0–0.5)
Eosinophils Relative: 6 %
HCT: 36.8 % — ABNORMAL LOW (ref 39.0–52.0)
Hemoglobin: 11.4 g/dL — ABNORMAL LOW (ref 13.0–17.0)
Immature Granulocytes: 0 %
Lymphocytes Relative: 19 %
Lymphs Abs: 1.6 10*3/uL (ref 0.7–4.0)
MCH: 32.5 pg (ref 26.0–34.0)
MCHC: 31 g/dL (ref 30.0–36.0)
MCV: 104.8 fL — ABNORMAL HIGH (ref 80.0–100.0)
Monocytes Absolute: 0.7 10*3/uL (ref 0.1–1.0)
Monocytes Relative: 9 %
Neutro Abs: 5.4 10*3/uL (ref 1.7–7.7)
Neutrophils Relative %: 65 %
Platelet Count: 288 10*3/uL (ref 150–400)
RBC: 3.51 MIL/uL — ABNORMAL LOW (ref 4.22–5.81)
RDW: 14.1 % (ref 11.5–15.5)
WBC Count: 8.3 10*3/uL (ref 4.0–10.5)
nRBC: 0 % (ref 0.0–0.2)

## 2022-07-24 LAB — CEA (ACCESS): CEA (CHCC): 4.63 ng/mL (ref 0.00–5.00)

## 2022-07-24 LAB — CMP (CANCER CENTER ONLY)
ALT: <5 U/L (ref 0–44)
AST: 10 U/L — ABNORMAL LOW (ref 15–41)
Albumin: 3.6 g/dL (ref 3.5–5.0)
Alkaline Phosphatase: 129 U/L — ABNORMAL HIGH (ref 38–126)
Anion gap: 6 (ref 5–15)
BUN: 41 mg/dL — ABNORMAL HIGH (ref 8–23)
CO2: 30 mmol/L (ref 22–32)
Calcium: 9.7 mg/dL (ref 8.9–10.3)
Chloride: 107 mmol/L (ref 98–111)
Creatinine: 2.5 mg/dL — ABNORMAL HIGH (ref 0.61–1.24)
GFR, Estimated: 26 mL/min — ABNORMAL LOW (ref >60)
Glucose, Bld: 91 mg/dL (ref 70–99)
Potassium: 5.2 mmol/L — ABNORMAL HIGH (ref 3.5–5.1)
Sodium: 143 mmol/L (ref 135–145)
Total Bilirubin: 0.6 mg/dL (ref 0.3–1.2)
Total Protein: 7 g/dL (ref 6.5–8.1)

## 2022-07-24 LAB — IRON AND IRON BINDING CAPACITY (CC-WL,HP ONLY)
Iron: 69 ug/dL (ref 45–182)
Saturation Ratios: 25 % (ref 17.9–39.5)
TIBC: 276 ug/dL (ref 250–450)
UIBC: 207 ug/dL (ref 117–376)

## 2022-07-24 NOTE — Progress Notes (Signed)
Patient Care Team: [name], Fanny Dance, MD as PCP - General (Family Medicine) Jake Bathe, MD as PCP - Cardiology (Cardiology) Charna Elizabeth, MD as Consulting Physician (Gastroenterology)   CHIEF COMPLAINT: Follow-up stage III colon cancer  Oncology History Overview Note  Cancer Staging Cancer of left colon [facility]) Staging form: Colon and Rectum, AJCC 8th Edition - Pathologic stage from 12/08/2020: Stage IIIB (pT3, pN1a, cM0) - Signed by Malachy Mood, MD on 12/11/2020    Cancer of left colon  12/05/2020 Procedure   Colonoscopy, under Dr. Loreta Ave  Impression: - Preparation of the colon was extremely poor inspite of aggressive lavage. - Diverticulosis in the sigmoid colon. - Likely malignant 5 cm tumor in the proximal descending colon-biopsied; mucosa 5 cm distal to the mass was injected with 3 cc's of [place] ink. - One 10 mm polyp in the cecum, removed with a hot snare x 1; resected and retrieved. - Moderate sized internal hemorrhoids.   12/05/2020 Pathology Results   FINAL MICROSCOPIC DIAGNOSIS:   A. COLON, CECAL, POLYPECTOMY:  - Tubular adenoma  - Negative for high-grade dysplasia or malignancy   B. COLON MASS, DESCENDING, BIOPSY:  - Invasive moderately differentiated adenocarcinoma, see comment    ADDENDUM: Mismatch Repair Protein (IHC)  SUMMARY INTERPRETATION: NORMAL    12/06/2020 Imaging   CT CAP  IMPRESSION: 1. Short segment of colonic wall thickening involving the splenic flexure likely reflects patient's presumed colonic malignancy. 2. Single 1.3 cm lymph node posterior to the colonic lesion likely reflecting local nodal involvement. No additional pathologically enlarged abdominal or pelvic lymph nodes to suggest distant nodal metastases. 3. Two subcentimeter hypodense hepatic lesions, incompletely characterized on this examination. Possibly reflecting benign hepatic cysts but cystic hepatic metastases not excluded on this study. Consider attention on close interval  follow-up imaging versus further evaluation with hepatic protocol MRI with and without contrast, preferably as an outpatient when patient is stable and able to follow commands. 4. Healing nonpathologic left lateral eighth and ninth rib fractures. 5. Aortic Atherosclerosis (ICD10-I70.0).   12/08/2020 Cancer Staging   Staging form: Colon and Rectum, AJCC 8th Edition - Pathologic stage from 12/08/2020: Stage IIIB (pT3, pN1a, cM0) - Signed by Malachy Mood, MD on 12/11/2020 Stage prefix: Initial diagnosis Total positive nodes: 1 Histologic grading system: 4 grade system Histologic grade (G): G2 Residual tumor (R): R0 - None   12/08/2020 Pathology Results   FINAL MICROSCOPIC DIAGNOSIS:   A. COLON, RIGHT TRANSVERSE, COLECTOMY:  - Invasive moderately differentiated colorectal adenocarcinoma, 5 cm.  - Margins not involved.  - Metastatic carcinoma in one of twenty-three lymph nodes (1/23).  - Five tubular adenomas.  - Benign appendix.  - See oncology table.   ADDENDUM:  Mismatch Repair Protein (IHC)  SUMMARY INTERPRETATION: NORMAL    12/10/2020 Initial Diagnosis   Cancer of left colon (HCC)   05/28/2021 Imaging   EXAM: CT CHEST, ABDOMEN AND PELVIS WITHOUT CONTRAST  IMPRESSION: 1. Prior right hemicolectomy with ileocolonic anastomosis. Prominent lymph nodes and stranding in the mesentery extending to the ileocolonic anastomosis in the left upper quadrant, likely reflecting postoperative change/inflammation. Close attention on follow-up studies is suggested. 2. No convincing evidence of residual/metastatic disease in the chest, abdomen or pelvis. 3. Short segment colonic wall thickening of a portion of decompressed sigmoid colon is new from prior and favored to reflect underdistention. Attention on follow-up studies is recommended. 4. Punctate nonobstructive right lower pole renal stone. 5.  Aortic Atherosclerosis (ICD10-I70.0).      CURRENT THERAPY: Surveillance  INTERVAL HISTORY [name] returns for follow-up, last seen by me 11/12/2021. He underwent surveillance CT 05/30/2022, appointments on 3/11 and 4/16 were rescheduled to today. He continues to reside at nursing facility, remains non weight bearing, and is "still learning to walk." Appetite is good but doesn't always like the food. Can't stand on scale but clothes fit the same. Stools are occasionally loose. Denies bloody stools, abdominal pain/bloating, or any other new/specific complaints.   ROS  All other systems reviewed and negative   Past Medical History:  Diagnosis Date   Arthritis    severe arthritis and deformity of his knee's   Chest pain    Chronic atrial fibrillation (HCC)    Chronic renal disease, stage III (HCC)    Chronic venous stasis dermatitis    colon ca 10/2020   Edema    Gout    History of cardiovascular stress test 01/17/2008   EF- 62%   Hypercholesterolemia    Hyperlipidemia    Hypertension    Kidney stones    Long term (current) use of anticoagulants    Long-term (current) use of anticoagulants    Obesity    Osteoarthritis    PAF (paroxysmal atrial fibrillation) (HCC)    Paroxysmal atrial fibrillation (HCC)    Secondary hypercoagulable state [facility]      Past Surgical History:  Procedure Laterality Date   BIOPSY  12/05/2020   Procedure: BIOPSY;  Surgeon: Charna Elizabeth, MD;  Location: [facility] ENDOSCOPY;  Service: Endoscopy;;   CARDIOLITE STRESS TEST     EF 62%   COLONOSCOPY N/A 12/05/2020   Procedure: COLONOSCOPY;  Surgeon: Charna Elizabeth, MD;  Location: [facility] ENDOSCOPY;  Service: Endoscopy;  Laterality: N/A;   ESOPHAGOGASTRODUODENOSCOPY (EGD) WITH PROPOFOL N/A 12/05/2020   Procedure: ESOPHAGOGASTRODUODENOSCOPY (EGD) WITH PROPOFOL;  Surgeon: Charna Elizabeth, MD;  Location: [facility] ENDOSCOPY;  Service: Endoscopy;  Laterality: N/A;   LAPAROSCOPIC LOW ANTERIOR RESCECTION WITH COLOANAL ANASTOMOSIS  12/08/2020   Procedure: LAPAROSCOPIC LOW ANTERIOR RESCECTION WITH COLOANAL ANASTOMOSIS;  Surgeon: Sheliah Hatch,  De Blanch, MD;  Location: [facility] OR;  Service: General;;   LAPAROSCOPIC PARTIAL COLECTOMY N/A 12/08/2020   Procedure: LAPAROSCOPIC ASSISTED POSSIBLE OPEN PARTIAL COLECTOMY WITH POSSIBLE OSTOMY;  Surgeon: Sheliah Hatch De Blanch, MD;  Location: [facility] OR;  Service: General;  Laterality: N/A;   POLYPECTOMY  12/05/2020   Procedure: POLYPECTOMY;  Surgeon: Charna Elizabeth, MD;  Location: [facility] ENDOSCOPY;  Service: Endoscopy;;   SUBMUCOSAL TATTOO INJECTION  12/05/2020   Procedure: SUBMUCOSAL TATTOO INJECTION;  Surgeon: Charna Elizabeth, MD;  Location: [facility] ENDOSCOPY;  Service: Endoscopy;;     Outpatient Encounter Medications as of 07/24/2022  Medication Sig   acetaminophen (TYLENOL) 325 MG tablet Take 325 mg by mouth every 6 (six) hours as needed (pain).    allopurinol (ZYLOPRIM) 100 MG tablet Take 100 mg by mouth daily.   amiodarone (PACERONE) 100 MG tablet Take 100 mg by mouth daily.   ammonium lactate (LAC-HYDRIN) 12 % lotion Apply 1 application topically 2 (two) times daily.   apixaban (ELIQUIS) 5 MG TABS tablet Take 1 tablet (5 mg total) by mouth 2 (two) times daily.   atorvastatin (LIPITOR) 20 MG tablet Take 20 mg by mouth Daily.   calcitRIOL (ROCALTROL) 0.5 MCG capsule Take 0.5 mcg daily by mouth.    Nutritional Supplements (,FEEDING SUPPLEMENT, PROSOURCE PLUS) liquid Take 30 mLs by mouth 3 (three) times daily between meals.   pantoprazole (PROTONIX) 40 MG tablet Take 1 tablet (40 mg total) by mouth daily.   potassium chloride SA (  KLOR-CON) 20 MEQ tablet TAKE 1 TABLET BY MOUTH DAILY (Patient taking differently: Take 20 mEq by mouth daily. TAKE 1 TABLET BY MOUTH DAILY)   [DISCONTINUED] allopurinol (ZYLOPRIM) 100 MG tablet Take 100 mg by mouth daily.   [DISCONTINUED] furosemide (LASIX) 20 MG tablet TAKE 2 TABLETS BY MOUTH TWICE DAILY (Patient taking differently: Take 40 mg by mouth 2 (two) times daily.)   [DISCONTINUED] K Phos Mono-Sod Phos Di & Mono (VIRT-PHOS 250 NEUTRAL) [phone] MG TABS Take 1 tablet by mouth 2  (two) times a day.    [DISCONTINUED] ondansetron (ZOFRAN) 4 MG tablet Take 4 mg by mouth daily.   [DISCONTINUED] traMADol (ULTRAM) 50 MG tablet Take 1 tablet by mouth every 6 (six) hours as needed.   No facility-administered encounter medications on file as of 07/24/2022.     Today's Vitals   07/24/22 1022  BP: (!) 156/65  Pulse: 64  Resp: 13  Temp: 97.7 F (36.5 C)  TempSrc: Oral  SpO2: 94%   There is no height or weight on file to calculate BMI.   PHYSICAL EXAM GENERAL:alert, no distress and comfortable SKIN: no rash  EYES: sclera clear LUNGS: clear with normal breathing effort HEART: regular rate & rhythm, no lower extremity edema ABDOMEN: abdomen soft, non-tender and normal bowel sounds NEURO: alert & oriented x 3 with fluent speech, non-weightbearing   CBC    Component Value Date/Time   WBC 8.3 07/24/2022 1008   WBC 7.1 11/12/2021 1016   RBC 3.51 (L) 07/24/2022 1008   HGB 11.4 (L) 07/24/2022 1008   HGB 13.1 06/12/2020 1126   HCT 36.8 (L) 07/24/2022 1008   HCT 37.6 06/12/2020 1126   PLT 288 07/24/2022 1008   PLT 289 06/12/2020 1126   MCV 104.8 (H) 07/24/2022 1008   MCV 98 (H) 06/12/2020 1126   MCH 32.5 07/24/2022 1008   MCHC 31.0 07/24/2022 1008   RDW 14.1 07/24/2022 1008   RDW 12.6 06/12/2020 1126   LYMPHSABS 1.6 07/24/2022 1008   MONOABS 0.7 07/24/2022 1008   EOSABS 0.5 07/24/2022 1008   BASOSABS 0.1 07/24/2022 1008     CMP     Component Value Date/Time   NA 143 07/24/2022 1008   NA 142 06/12/2020 1126   K 5.2 (H) 07/24/2022 1008   CL 107 07/24/2022 1008   CO2 30 07/24/2022 1008   GLUCOSE 91 07/24/2022 1008   BUN 41 (H) 07/24/2022 1008   BUN 18 06/12/2020 1126   CREATININE 2.50 (H) 07/24/2022 1008   CREATININE 1.79 (H) 01/01/2015 1234   CALCIUM 9.7 07/24/2022 1008   PROT 7.0 07/24/2022 1008   PROT 6.5 06/12/2020 1126   ALBUMIN 3.6 07/24/2022 1008   ALBUMIN 3.9 06/12/2020 1126   AST 10 (L) 07/24/2022 1008   ALT <5 07/24/2022 1008   ALKPHOS  129 (H) 07/24/2022 1008   BILITOT 0.6 07/24/2022 1008   GFRNONAA 26 (L) 07/24/2022 1008   GFRAA 38 (L) 12/21/2019 1155     ASSESSMENT & PLAN: Bradley Stokes is a 76 y.o. male with    1. Cancer of right transverse colon, pT3N1aM0 stage IIIB -Diagnosed in 11/2020, s/p right colectomy on 12/08/20 with Dr. Sheliah Hatch. Pathology showed 5 cm tumor, margins not involved, 1 of 23 lymph nodes involved, and five tubular adenomas -liver MRI on 12/10/20 showed two probable liver cysts, not considered suspicious for metastatic disease. -due to his poor performance status, and stage IV kidney disease, he was not a candidate for adjuvant chemo. he is  on surveillance   -surveillance CT CAP on 05/28/21 showed postoperative changes with prominent lymph nodes, short segment wall thickening in the sigmoid colon in a patient with diverticulosis, overall no convincing evidence of residual/metastatic disease.  -Bradley Stokes is clinically doing well from a colon cancer standpoint.  Exam is benign, labs are stable.  I reviewed his recent surveillance CT from 05/30/2022 which is stable from prior, no evidence of recurrent disease. -He has declined surveillance colonoscopies due to poor prep tolerance -He will see [place] back in 6 months, or sooner if needed.  We reviewed signs and symptoms of recurrence   2. HTN, AF, CKD stage IV -F/u with PCP -CKD worse in 2022, he continues follow-up with nephrology and cardiology   3. Anemia  -Secondary to #1 and surgery, and chronic kidney disease -Slightly improved, hgb 11.4 -Iron panel normalized, continue oral iron    4. Social support -he previously lived with his sister. They are now both in the same skilled rehab facility for different reasons.   5. Knee pain  -f/u with NP at SNF -Recently received injections to both knees  -Still completely nonweightbearing, participates in PT at facility      PLAN: -Recent CT and today's labs reviewed, will f/up the pending CEA From  today -Continue colon cancer surveillance -F/up in 6 months, or sooner if needed -Reviewed signs/symptoms of recurrence     All questions were answered. The patient knows to call the clinic with any problems, questions or concerns. No barriers to learning were detected. I spent 20 minutes counseling the patient face to face. The total time spent in the appointment was 30 minutes and more than 50% was on counseling, review of test results, and coordination of care.   Santiago Glad, NP-C 07/24/2022

## 2022-07-24 NOTE — Addendum Note (Signed)
Addended by: Verlee Rossetti on: 07/24/2022 02:27 PM   Modules accepted: Orders

## 2022-07-24 NOTE — Patient Instructions (Signed)
Your recent CT scan shows no evidence of recurrence. You remain in remission from colon cancer.  Please return for lab and follow up at the [facility] in 6 months Please return/call sooner if you experience change in bowel habits, blood in your stool, abdominal pain, or unintentional weight loss.  Thank you, Santiago Glad, NP [phone]

## 2022-07-24 NOTE — Telephone Encounter (Addendum)
Called [facility] and spoke with nurse. Relayed the message below, they voiced full understanding.   ----- Message from Pollyann Samples, NP sent at 07/24/2022 12:17 PM EDT ----- Please call patient/facility. CMP resulted after he left. K is elevated 5.2. I recommend to hold oral potassium for now and follow up with PCP/nephrology.   Thanks, Clayborn Heron NP

## 2022-07-28 ENCOUNTER — Telehealth: Payer: Self-pay

## 2022-07-28 DIAGNOSIS — F4320 Adjustment disorder, unspecified: Secondary | ICD-10-CM | POA: Diagnosis not present

## 2022-07-28 DIAGNOSIS — F411 Generalized anxiety disorder: Secondary | ICD-10-CM | POA: Diagnosis not present

## 2022-07-28 DIAGNOSIS — I10 Essential (primary) hypertension: Secondary | ICD-10-CM | POA: Diagnosis not present

## 2022-07-28 NOTE — Telephone Encounter (Addendum)
Called Nursing facility to relay message below, nurse was not available left a message with Elease Hashimoto who answered the phone. She voiced full understanding and read the message back to me to confirm.  ----- Message from Pollyann Samples, NP sent at 07/28/2022  8:50 AM EDT ----- Please let Mr. Sweeney know CEA is normal, no concerns.   Thanks, Clayborn Heron NP

## 2022-07-29 DIAGNOSIS — M6281 Muscle weakness (generalized): Secondary | ICD-10-CM | POA: Diagnosis not present

## 2022-07-29 DIAGNOSIS — R2689 Other abnormalities of gait and mobility: Secondary | ICD-10-CM | POA: Diagnosis not present

## 2022-07-31 DIAGNOSIS — M6281 Muscle weakness (generalized): Secondary | ICD-10-CM | POA: Diagnosis not present

## 2022-07-31 DIAGNOSIS — R2689 Other abnormalities of gait and mobility: Secondary | ICD-10-CM | POA: Diagnosis not present

## 2022-08-01 DIAGNOSIS — Z79899 Other long term (current) drug therapy: Secondary | ICD-10-CM | POA: Diagnosis not present

## 2022-08-01 DIAGNOSIS — E782 Mixed hyperlipidemia: Secondary | ICD-10-CM | POA: Diagnosis not present

## 2022-08-01 DIAGNOSIS — D518 Other vitamin B12 deficiency anemias: Secondary | ICD-10-CM | POA: Diagnosis not present

## 2022-08-01 DIAGNOSIS — E119 Type 2 diabetes mellitus without complications: Secondary | ICD-10-CM | POA: Diagnosis not present

## 2022-08-04 DIAGNOSIS — M6281 Muscle weakness (generalized): Secondary | ICD-10-CM | POA: Diagnosis not present

## 2022-08-04 DIAGNOSIS — R2689 Other abnormalities of gait and mobility: Secondary | ICD-10-CM | POA: Diagnosis not present

## 2022-08-07 DIAGNOSIS — M109 Gout, unspecified: Secondary | ICD-10-CM | POA: Diagnosis not present

## 2022-08-07 DIAGNOSIS — M159 Polyosteoarthritis, unspecified: Secondary | ICD-10-CM | POA: Diagnosis not present

## 2022-08-07 DIAGNOSIS — R2689 Other abnormalities of gait and mobility: Secondary | ICD-10-CM | POA: Diagnosis not present

## 2022-08-07 DIAGNOSIS — E785 Hyperlipidemia, unspecified: Secondary | ICD-10-CM | POA: Diagnosis not present

## 2022-08-07 DIAGNOSIS — M6281 Muscle weakness (generalized): Secondary | ICD-10-CM | POA: Diagnosis not present

## 2022-08-07 DIAGNOSIS — E538 Deficiency of other specified B group vitamins: Secondary | ICD-10-CM | POA: Diagnosis not present

## 2022-08-07 DIAGNOSIS — K219 Gastro-esophageal reflux disease without esophagitis: Secondary | ICD-10-CM | POA: Diagnosis not present

## 2022-08-07 DIAGNOSIS — I4891 Unspecified atrial fibrillation: Secondary | ICD-10-CM | POA: Diagnosis not present

## 2022-08-07 DIAGNOSIS — I509 Heart failure, unspecified: Secondary | ICD-10-CM | POA: Diagnosis not present

## 2022-08-07 DIAGNOSIS — E559 Vitamin D deficiency, unspecified: Secondary | ICD-10-CM | POA: Diagnosis not present

## 2022-08-07 DIAGNOSIS — N189 Chronic kidney disease, unspecified: Secondary | ICD-10-CM | POA: Diagnosis not present

## 2022-08-11 DIAGNOSIS — M6281 Muscle weakness (generalized): Secondary | ICD-10-CM | POA: Diagnosis not present

## 2022-08-11 DIAGNOSIS — R2689 Other abnormalities of gait and mobility: Secondary | ICD-10-CM | POA: Diagnosis not present

## 2022-08-13 DIAGNOSIS — R2689 Other abnormalities of gait and mobility: Secondary | ICD-10-CM | POA: Diagnosis not present

## 2022-08-13 DIAGNOSIS — F4320 Adjustment disorder, unspecified: Secondary | ICD-10-CM | POA: Diagnosis not present

## 2022-08-13 DIAGNOSIS — F411 Generalized anxiety disorder: Secondary | ICD-10-CM | POA: Diagnosis not present

## 2022-08-13 DIAGNOSIS — M6281 Muscle weakness (generalized): Secondary | ICD-10-CM | POA: Diagnosis not present

## 2022-08-13 DIAGNOSIS — G3184 Mild cognitive impairment, so stated: Secondary | ICD-10-CM | POA: Diagnosis not present

## 2022-08-18 DIAGNOSIS — R2689 Other abnormalities of gait and mobility: Secondary | ICD-10-CM | POA: Diagnosis not present

## 2022-08-18 DIAGNOSIS — M6281 Muscle weakness (generalized): Secondary | ICD-10-CM | POA: Diagnosis not present

## 2022-08-19 DIAGNOSIS — E038 Other specified hypothyroidism: Secondary | ICD-10-CM | POA: Diagnosis not present

## 2022-08-19 DIAGNOSIS — D518 Other vitamin B12 deficiency anemias: Secondary | ICD-10-CM | POA: Diagnosis not present

## 2022-08-19 DIAGNOSIS — I4891 Unspecified atrial fibrillation: Secondary | ICD-10-CM | POA: Diagnosis not present

## 2022-08-19 DIAGNOSIS — E559 Vitamin D deficiency, unspecified: Secondary | ICD-10-CM | POA: Diagnosis not present

## 2022-08-19 DIAGNOSIS — C189 Malignant neoplasm of colon, unspecified: Secondary | ICD-10-CM | POA: Diagnosis not present

## 2022-08-19 DIAGNOSIS — E785 Hyperlipidemia, unspecified: Secondary | ICD-10-CM | POA: Diagnosis not present

## 2022-08-19 DIAGNOSIS — E7849 Other hyperlipidemia: Secondary | ICD-10-CM | POA: Diagnosis not present

## 2022-08-19 DIAGNOSIS — I509 Heart failure, unspecified: Secondary | ICD-10-CM | POA: Diagnosis not present

## 2022-08-19 DIAGNOSIS — E119 Type 2 diabetes mellitus without complications: Secondary | ICD-10-CM | POA: Diagnosis not present

## 2022-08-20 DIAGNOSIS — M6281 Muscle weakness (generalized): Secondary | ICD-10-CM | POA: Diagnosis not present

## 2022-08-20 DIAGNOSIS — R2689 Other abnormalities of gait and mobility: Secondary | ICD-10-CM | POA: Diagnosis not present

## 2022-08-27 DIAGNOSIS — M6281 Muscle weakness (generalized): Secondary | ICD-10-CM | POA: Diagnosis not present

## 2022-08-27 DIAGNOSIS — R2689 Other abnormalities of gait and mobility: Secondary | ICD-10-CM | POA: Diagnosis not present

## 2022-08-27 DIAGNOSIS — I10 Essential (primary) hypertension: Secondary | ICD-10-CM | POA: Diagnosis not present

## 2022-08-28 DIAGNOSIS — F4320 Adjustment disorder, unspecified: Secondary | ICD-10-CM | POA: Diagnosis not present

## 2022-08-28 DIAGNOSIS — F411 Generalized anxiety disorder: Secondary | ICD-10-CM | POA: Diagnosis not present

## 2022-08-29 DIAGNOSIS — M6281 Muscle weakness (generalized): Secondary | ICD-10-CM | POA: Diagnosis not present

## 2022-08-29 DIAGNOSIS — R2689 Other abnormalities of gait and mobility: Secondary | ICD-10-CM | POA: Diagnosis not present

## 2023-01-21 NOTE — Assessment & Plan Note (Signed)
Cancer of right transverse colon, pT3N1aM0 stage IIIB -Diagnosed in 11/2020, s/p right colectomy on 12/08/20 with Dr. Sheliah Hatch. Pathology showed 5 cm tumor, margins not involved, 1 of 23 lymph nodes involved, and five tubular adenomas -liver MRI on 12/10/20 showed two probable liver cysts, not considered suspicious for metastatic disease. -due to his poor performance status, and stage IV kidney disease, he was not a candidate for adjuvant chemo. he is on surveillance   -surveillance CT CAP on 05/28/21 showed postoperative changes with prominent lymph nodes, short segment wall thickening in the sigmoid colon in a patient with diverticulosis, overall no convincing evidence of residual/metastatic disease.  -Bradley Stokes is clinically doing well from a colon cancer standpoint.  Exam is benign, labs are stable. Recent surveillance CT from 05/30/2022 is stable from prior, no evidence of recurrent disease. -He has declined surveillance colonoscopies due to poor prep tolerance -He will see [place] back in 6 months, or sooner if needed.  We reviewed signs and symptoms of recurrence

## 2023-01-21 NOTE — Progress Notes (Unsigned)
Patient Care Team: [name], Fanny Dance, MD as PCP - General (Family Medicine) Jake Bathe, MD as PCP - Cardiology (Cardiology) Charna Elizabeth, MD as Consulting Physician (Gastroenterology)  Clinic Day:  01/22/2023  Referring physician: Clayborn Heron, MD  ASSESSMENT & PLAN:   Assessment & Plan: Cancer of left colon [facility]) Cancer of right transverse colon, pT3N1aM0 stage IIIB -Diagnosed in 11/2020, s/p right colectomy on 12/08/20 with Dr. Sheliah Hatch. Pathology showed 5 cm tumor, margins not involved, 1 of 23 lymph nodes involved, and five tubular adenomas -liver MRI on 12/10/20 showed two probable liver cysts, not considered suspicious for metastatic disease. -due to his poor performance status, and stage IV kidney disease, he was not a candidate for adjuvant chemo. he is on surveillance   -surveillance CT CAP on 05/28/21 showed postoperative changes with prominent lymph nodes, short segment wall thickening in the sigmoid colon in a patient with diverticulosis, overall no convincing evidence of residual/metastatic disease.  -Bradley Stokes is clinically doing well from a colon cancer standpoint.  Exam is benign, labs are stable. Recent surveillance CT from 05/30/2022 is stable from prior, no evidence of recurrent disease. -He has declined surveillance colonoscopies due to poor prep tolerance -He will see [place] back in 6 months, or sooner if needed.  We reviewed signs and symptoms of recurrence    Plan: Labs reviewed  -CBC showing WBC 6.9; Hgb 12.4; Hct 40.6; Plt 296; Anc 4.5 -CMP - K 4.6; glucose 112; BUN 37; Creatinine 2.4; eGFR 27; Ca 9.4; AST and ALT normal; AlkPhos 138.   -patient with known history of CKD and sees nephrology and cardiology.  -iron panel pending. Forms completed and returned to patient's caregiver from nursing facility. Clarified the need for imodium is after 3 or 4 loose bowel movements in single 24 hour period.  CT chest, abdomen, and pelvis without contrast to be done  05/2023 Labs and follow up in 6 months. Review restaging CT at that time.     The patient understands the plans discussed today and is in agreement with them.  He knows to contact our office if he develops concerns prior to his next appointment.  I provided 30 minutes of face-to-face time during this encounter and > 50% was spent counseling as documented under my assessment and plan.    Carlean Jews, NP  [facility] [address] [place] [address] Dept: [phone] Dept Fax: [phone]   Orders Placed This Encounter  Procedures   CT CHEST ABDOMEN PELVIS WO CONTRAST    Standing Status:   Future    Standing Expiration Date:   07/23/2023    Order Specific Question:   Preferred imaging location?    Answer:   [facility]    Order Specific Question:   If indicated for the ordered procedure, I authorize the administration of oral contrast media per Radiology protocol    Answer:   Yes    Order Specific Question:   Does the patient have a contrast media/X-ray dye allergy?    Answer:   No      CHIEF COMPLAINT:  CC: cancer of left colon   Current Treatment:  surveillance  INTERVAL HISTORY:  Bradley is here today for repeat clinical assessment. He denies fevers or chills. He denies pain. His appetite is "so so." States that he does not like a lot of the food that is made in facility where he lives. He states  that he eats what he likes off his plate and leaves the rest. He states that since his colon cancer surgery, he does have 1 loose bowel movement per day. Generally happens after he eats either breakfast or lunch. States that it is only one time per day, sometimes two. He denies diarrhea. Reports that staff at nursing facility where he lives often tries to give him imodium after the first loose stool. He states that he will typically decline the imodium, but feels like staff really wants him to take  the medication. He declines abdominal pain or cramping. Denies nausea or vomiting. His weight has been stable.  I have reviewed the past medical history, past surgical history, social history and family history with the patient and they are unchanged from previous note.  ALLERGIES:  is allergic to other.  MEDICATIONS:  Current Outpatient Medications  Medication Sig Dispense Refill   acetaminophen (TYLENOL) 325 MG tablet Take 325 mg by mouth every 6 (six) hours as needed (pain).      allopurinol (ZYLOPRIM) 100 MG tablet Take 100 mg by mouth daily.     amiodarone (PACERONE) 100 MG tablet Take 100 mg by mouth daily.     ammonium lactate (LAC-HYDRIN) 12 % lotion Apply 1 application topically 2 (two) times daily.     apixaban (ELIQUIS) 5 MG TABS tablet Take 1 tablet (5 mg total) by mouth 2 (two) times daily. 60 tablet    atorvastatin (LIPITOR) 20 MG tablet Take 20 mg by mouth Daily.     cholecalciferol (VITAMIN D3) 25 MCG (1000 UNIT) tablet Take 2 tablets by mouth daily. Take 1 tablet at breakfast and 1 tablet at dinner     ferrous sulfate 325 (65 FE) MG EC tablet Take 1 tablet by mouth daily with breakfast.     loperamide (IMODIUM A-D) 2 MG tablet Take 2 mg by mouth as needed for diarrhea or loose stools.     loratadine (CLARITIN) 10 MG tablet Take 1 tablet by mouth daily.     Nutritional Supplements (,FEEDING SUPPLEMENT, PROSOURCE PLUS) liquid Take 30 mLs by mouth 3 (three) times daily between meals.     pantoprazole (PROTONIX) 40 MG tablet Take 1 tablet (40 mg total) by mouth daily.     potassium chloride SA (KLOR-CON) 20 MEQ tablet TAKE 1 TABLET BY MOUTH DAILY (Patient taking differently: Take 20 mEq by mouth daily. TAKE 1 TABLET BY MOUTH DAILY) 90 tablet 3   vitamin B-12 (CYANOCOBALAMIN) 100 MCG tablet Take 1 tablet by mouth daily.     No current facility-administered medications for this visit.    HISTORY OF PRESENT ILLNESS:   Oncology History Overview Note  Cancer Staging Cancer of  left colon [facility]) Staging form: Colon and Rectum, AJCC 8th Edition - Pathologic stage from 12/08/2020: Stage IIIB (pT3, pN1a, cM0) - Signed by Malachy Mood, MD on 12/11/2020    Cancer of left colon (HCC)  12/05/2020 Procedure   Colonoscopy, under Dr. Loreta Ave  Impression: - Preparation of the colon was extremely poor inspite of aggressive lavage. - Diverticulosis in the sigmoid colon. - Likely malignant 5 cm tumor in the proximal descending colon-biopsied; mucosa 5 cm distal to the mass was injected with 3 cc's of [place] ink. - One 10 mm polyp in the cecum, removed with a hot snare x 1; resected and retrieved. - Moderate sized internal hemorrhoids.   12/05/2020 Pathology Results   FINAL MICROSCOPIC DIAGNOSIS:   A. COLON, CECAL, POLYPECTOMY:  - Tubular adenoma  -  Negative for high-grade dysplasia or malignancy   B. COLON MASS, DESCENDING, BIOPSY:  - Invasive moderately differentiated adenocarcinoma, see comment    ADDENDUM: Mismatch Repair Protein (IHC)  SUMMARY INTERPRETATION: NORMAL    12/06/2020 Imaging   CT CAP  IMPRESSION: 1. Short segment of colonic wall thickening involving the splenic flexure likely reflects patient's presumed colonic malignancy. 2. Single 1.3 cm lymph node posterior to the colonic lesion likely reflecting local nodal involvement. No additional pathologically enlarged abdominal or pelvic lymph nodes to suggest distant nodal metastases. 3. Two subcentimeter hypodense hepatic lesions, incompletely characterized on this examination. Possibly reflecting benign hepatic cysts but cystic hepatic metastases not excluded on this study. Consider attention on close interval follow-up imaging versus further evaluation with hepatic protocol MRI with and without contrast, preferably as an outpatient when patient is stable and able to follow commands. 4. Healing nonpathologic left lateral eighth and ninth rib fractures. 5. Aortic Atherosclerosis (ICD10-I70.0).   12/08/2020  Cancer Staging   Staging form: Colon and Rectum, AJCC 8th Edition - Pathologic stage from 12/08/2020: Stage IIIB (pT3, pN1a, cM0) - Signed by Malachy Mood, MD on 12/11/2020 Stage prefix: Initial diagnosis Total positive nodes: 1 Histologic grading system: 4 grade system Histologic grade (G): G2 Residual tumor (R): R0 - None   12/08/2020 Pathology Results   FINAL MICROSCOPIC DIAGNOSIS:   A. COLON, RIGHT TRANSVERSE, COLECTOMY:  - Invasive moderately differentiated colorectal adenocarcinoma, 5 cm.  - Margins not involved.  - Metastatic carcinoma in one of twenty-three lymph nodes (1/23).  - Five tubular adenomas.  - Benign appendix.  - See oncology table.   ADDENDUM:  Mismatch Repair Protein (IHC)  SUMMARY INTERPRETATION: NORMAL    12/10/2020 Initial Diagnosis   Cancer of left colon (HCC)   05/28/2021 Imaging   EXAM: CT CHEST, ABDOMEN AND PELVIS WITHOUT CONTRAST  IMPRESSION: 1. Prior right hemicolectomy with ileocolonic anastomosis. Prominent lymph nodes and stranding in the mesentery extending to the ileocolonic anastomosis in the left upper quadrant, likely reflecting postoperative change/inflammation. Close attention on follow-up studies is suggested. 2. No convincing evidence of residual/metastatic disease in the chest, abdomen or pelvis. 3. Short segment colonic wall thickening of a portion of decompressed sigmoid colon is new from prior and favored to reflect underdistention. Attention on follow-up studies is recommended. 4. Punctate nonobstructive right lower pole renal stone. 5.  Aortic Atherosclerosis (ICD10-I70.0).       REVIEW OF SYSTEMS:   Constitutional: Denies fevers, chills or abnormal weight loss Eyes: Denies blurriness of vision Ears, nose, mouth, throat, and face: Denies mucositis or sore throat Respiratory: Denies cough, dyspnea or wheezes Cardiovascular: Denies palpitation, chest discomfort or lower extremity swelling Gastrointestinal:  Denies nausea,  heartburn or change in bowel habits. Typically has one or two loose bowel movements per day and usually after he eats breakfast or lunch.  Skin: Denies abnormal skin rashes Lymphatics: Denies new lymphadenopathy or easy bruising Neurological:Denies numbness, tingling or new weaknesses Behavioral/Psych: Mood is stable, no new changes  All other systems were reviewed with the patient and are negative.   VITALS:   Today's Vitals   01/22/23 1124  BP: (!) 141/66  Pulse: 78  Resp: 16  Temp: (!) 97.1 F (36.2 C)  TempSrc: Oral  SpO2: 98%   There is no height or weight on file to calculate BMI.   Wt Readings from Last 3 Encounters:  12/13/20 291 lb 10.7 oz (132.3 kg)  06/12/20 287 lb (130.2 kg)  12/21/19 298 lb 12.8 oz (135.5 kg)  There is no height or weight on file to calculate BMI.  Performance status (ECOG): 2 - Symptomatic, <50% confined to bed  PHYSICAL EXAM:   GENERAL:alert, no distress and comfortable SKIN: skin color, texture, turgor are normal, no rashes or significant lesions EYES: normal, Conjunctiva are pink and non-injected, sclera clear OROPHARYNX:no exudate, no erythema and lips, buccal mucosa, and tongue normal  NECK: supple, thyroid normal size, non-tender, without nodularity LYMPH:  no palpable lymphadenopathy in the cervical, axillary or inguinal LUNGS: clear to auscultation and percussion with normal breathing effort HEART: regular rate & rhythm and no murmurs and no lower extremity edema ABDOMEN:abdomen soft, non-tender and normal bowel sounds Musculoskeletal:no cyanosis of digits and no clubbing  NEURO: alert & oriented x 3 with fluent speech, no focal motor/sensory deficits  LABORATORY DATA:  I have reviewed the data as listed    Component Value Date/Time   NA 144 01/22/2023 1058   NA 142 06/12/2020 1126   K 4.6 01/22/2023 1058   CL 105 01/22/2023 1058   CO2 26 01/22/2023 1058   GLUCOSE 112 (H) 01/22/2023 1058   BUN 37 (H) 01/22/2023 1058    BUN 18 06/12/2020 1126   CREATININE 2.40 (H) 01/22/2023 1058   CREATININE 1.79 (H) 01/01/2015 1234   CALCIUM 9.4 01/22/2023 1058   PROT 7.4 01/22/2023 1058   PROT 6.5 06/12/2020 1126   ALBUMIN 3.3 (L) 01/22/2023 1058   ALBUMIN 3.9 06/12/2020 1126   AST 15 01/22/2023 1058   ALT 8 01/22/2023 1058   ALKPHOS 138 (H) 01/22/2023 1058   BILITOT 0.9 01/22/2023 1058   GFRNONAA 27 (L) 01/22/2023 1058   GFRAA 38 (L) 12/21/2019 1155    Lab Results  Component Value Date   WBC 6.9 01/22/2023   NEUTROABS 4.5 01/22/2023   HGB 12.4 (L) 01/22/2023   HCT 40.6 01/22/2023   MCV 104.9 (H) 01/22/2023   PLT 290 01/22/2023

## 2023-01-22 ENCOUNTER — Inpatient Hospital Stay: Payer: Medicare HMO | Attending: Nurse Practitioner

## 2023-01-22 ENCOUNTER — Encounter: Payer: Self-pay | Admitting: Nurse Practitioner

## 2023-01-22 ENCOUNTER — Inpatient Hospital Stay: Payer: Medicare HMO | Admitting: Nurse Practitioner

## 2023-01-22 ENCOUNTER — Other Ambulatory Visit: Payer: Self-pay

## 2023-01-22 VITALS — BP 141/66 | HR 78 | Temp 97.1°F | Resp 16

## 2023-01-22 DIAGNOSIS — Z85038 Personal history of other malignant neoplasm of large intestine: Secondary | ICD-10-CM | POA: Insufficient documentation

## 2023-01-22 DIAGNOSIS — C186 Malignant neoplasm of descending colon: Secondary | ICD-10-CM

## 2023-01-22 DIAGNOSIS — Z9049 Acquired absence of other specified parts of digestive tract: Secondary | ICD-10-CM | POA: Insufficient documentation

## 2023-01-22 LAB — CBC WITH DIFFERENTIAL (CANCER CENTER ONLY)
Abs Immature Granulocytes: 0.02 10*3/uL (ref 0.00–0.07)
Basophils Absolute: 0.1 10*3/uL (ref 0.0–0.1)
Basophils Relative: 1 %
Eosinophils Absolute: 0.4 10*3/uL (ref 0.0–0.5)
Eosinophils Relative: 6 %
HCT: 40.6 % (ref 39.0–52.0)
Hemoglobin: 12.4 g/dL — ABNORMAL LOW (ref 13.0–17.0)
Immature Granulocytes: 0 %
Lymphocytes Relative: 21 %
Lymphs Abs: 1.5 10*3/uL (ref 0.7–4.0)
MCH: 32 pg (ref 26.0–34.0)
MCHC: 30.5 g/dL (ref 30.0–36.0)
MCV: 104.9 fL — ABNORMAL HIGH (ref 80.0–100.0)
Monocytes Absolute: 0.5 10*3/uL (ref 0.1–1.0)
Monocytes Relative: 7 %
Neutro Abs: 4.5 10*3/uL (ref 1.7–7.7)
Neutrophils Relative %: 65 %
Platelet Count: 290 10*3/uL (ref 150–400)
RBC: 3.87 MIL/uL — ABNORMAL LOW (ref 4.22–5.81)
RDW: 12.4 % (ref 11.5–15.5)
WBC Count: 6.9 10*3/uL (ref 4.0–10.5)
nRBC: 0 % (ref 0.0–0.2)

## 2023-01-22 LAB — CMP (CANCER CENTER ONLY)
ALT: 8 U/L (ref 0–44)
AST: 15 U/L (ref 15–41)
Albumin: 3.3 g/dL — ABNORMAL LOW (ref 3.5–5.0)
Alkaline Phosphatase: 138 U/L — ABNORMAL HIGH (ref 38–126)
Anion gap: 13 (ref 5–15)
BUN: 37 mg/dL — ABNORMAL HIGH (ref 8–23)
CO2: 26 mmol/L (ref 22–32)
Calcium: 9.4 mg/dL (ref 8.9–10.3)
Chloride: 105 mmol/L (ref 98–111)
Creatinine: 2.4 mg/dL — ABNORMAL HIGH (ref 0.61–1.24)
GFR, Estimated: 27 mL/min — ABNORMAL LOW (ref >60)
Glucose, Bld: 112 mg/dL — ABNORMAL HIGH (ref 70–99)
Potassium: 4.6 mmol/L (ref 3.5–5.1)
Sodium: 144 mmol/L (ref 135–145)
Total Bilirubin: 0.9 mg/dL (ref 0.3–1.2)
Total Protein: 7.4 g/dL (ref 6.5–8.1)

## 2023-01-22 LAB — IRON AND IRON BINDING CAPACITY (CC-WL,HP ONLY)
Iron: 97 ug/dL (ref 45–182)
Saturation Ratios: 32 % (ref 17.9–39.5)
TIBC: 302 ug/dL (ref 250–450)
UIBC: 205 ug/dL

## 2023-06-22 ENCOUNTER — Ambulatory Visit (HOSPITAL_COMMUNITY)
Admission: RE | Admit: 2023-06-22 | Discharge: 2023-06-22 | Disposition: A | Discharge status: Home / Self Care | Payer: Medicare HMO | Source: Ambulatory Visit | Attending: Nurse Practitioner | Admitting: Nurse Practitioner

## 2023-06-22 DIAGNOSIS — C186 Malignant neoplasm of descending colon: Secondary | ICD-10-CM | POA: Diagnosis present

## 2023-07-21 ENCOUNTER — Other Ambulatory Visit: Payer: Self-pay

## 2023-07-21 DIAGNOSIS — C186 Malignant neoplasm of descending colon: Secondary | ICD-10-CM

## 2023-07-21 NOTE — Assessment & Plan Note (Deleted)
 pT3N1aM0 stage IIIB -Diagnosed in 11/2020, s/p right colectomy on 12/08/20 with Dr. Dorrie Gaudier. Pathology showed 5 cm tumor, margins not involved, 1 of 23 lymph nodes involved, and five tubular adenomas -liver MRI on 12/10/20 showed two probable liver cysts, not considered suspicious for metastatic disease. -due to his poor performance status, and stage IV kidney disease, he was not a candidate for adjuvant chemo. he is on surveillance   -surveillance CT CAP on 06/22/2023 showed postoperative changes with no evidence of residual/metastatic disease except small left pleural effusion. Will repeat CT chest in 3-4 months

## 2023-07-22 ENCOUNTER — Telehealth: Payer: Self-pay

## 2023-07-22 ENCOUNTER — Inpatient Hospital Stay: Payer: Medicare HMO | Admitting: Hematology

## 2023-07-22 ENCOUNTER — Inpatient Hospital Stay: Payer: Medicare HMO | Attending: Family Medicine

## 2023-07-22 DIAGNOSIS — C186 Malignant neoplasm of descending colon: Secondary | ICD-10-CM

## 2023-07-22 NOTE — Telephone Encounter (Signed)
 Patient did not show up for his scheduled appointment with Dr. Maryalice Smaller today.  Attempted to contact the patient to inquire whereabouts. Unable to reach the patient [phone]. LVM to contact the facility.

## 2023-07-23 ENCOUNTER — Ambulatory Visit: Payer: Medicare HMO | Admitting: Hematology

## 2023-07-23 ENCOUNTER — Other Ambulatory Visit: Payer: Medicare HMO

## 2023-09-11 ENCOUNTER — Other Ambulatory Visit: Payer: Self-pay

## 2023-09-11 ENCOUNTER — Encounter (HOSPITAL_COMMUNITY): Payer: Self-pay | Admitting: Emergency Medicine

## 2023-09-11 ENCOUNTER — Emergency Department (HOSPITAL_COMMUNITY)

## 2023-09-11 ENCOUNTER — Inpatient Hospital Stay (HOSPITAL_COMMUNITY)
Admission: EM | Admit: 2023-09-11 | Discharge: 2023-09-14 | DRG: 378 | Disposition: A | Discharge status: Other Acute Inpatient Hospital | Source: Skilled Nursing Facility | Attending: Internal Medicine | Admitting: Internal Medicine

## 2023-09-11 DIAGNOSIS — Z66 Do not resuscitate: Secondary | ICD-10-CM | POA: Diagnosis present

## 2023-09-11 DIAGNOSIS — K625 Hemorrhage of anus and rectum: Principal | ICD-10-CM

## 2023-09-11 DIAGNOSIS — Z79899 Other long term (current) drug therapy: Secondary | ICD-10-CM

## 2023-09-11 DIAGNOSIS — R911 Solitary pulmonary nodule: Secondary | ICD-10-CM | POA: Diagnosis present

## 2023-09-11 DIAGNOSIS — E7800 Pure hypercholesterolemia, unspecified: Secondary | ICD-10-CM | POA: Diagnosis present

## 2023-09-11 DIAGNOSIS — I739 Peripheral vascular disease, unspecified: Secondary | ICD-10-CM | POA: Diagnosis present

## 2023-09-11 DIAGNOSIS — I872 Venous insufficiency (chronic) (peripheral): Secondary | ICD-10-CM | POA: Diagnosis present

## 2023-09-11 DIAGNOSIS — I129 Hypertensive chronic kidney disease with stage 1 through stage 4 chronic kidney disease, or unspecified chronic kidney disease: Secondary | ICD-10-CM | POA: Diagnosis present

## 2023-09-11 DIAGNOSIS — D500 Iron deficiency anemia secondary to blood loss (chronic): Secondary | ICD-10-CM | POA: Diagnosis present

## 2023-09-11 DIAGNOSIS — M109 Gout, unspecified: Secondary | ICD-10-CM | POA: Diagnosis present

## 2023-09-11 DIAGNOSIS — K922 Gastrointestinal hemorrhage, unspecified: Secondary | ICD-10-CM | POA: Diagnosis not present

## 2023-09-11 DIAGNOSIS — Z9049 Acquired absence of other specified parts of digestive tract: Secondary | ICD-10-CM | POA: Diagnosis not present

## 2023-09-11 DIAGNOSIS — E669 Obesity, unspecified: Secondary | ICD-10-CM | POA: Diagnosis present

## 2023-09-11 DIAGNOSIS — Z6835 Body mass index (BMI) 35.0-35.9, adult: Secondary | ICD-10-CM | POA: Diagnosis not present

## 2023-09-11 DIAGNOSIS — I480 Paroxysmal atrial fibrillation: Secondary | ICD-10-CM | POA: Diagnosis present

## 2023-09-11 DIAGNOSIS — I482 Chronic atrial fibrillation, unspecified: Secondary | ICD-10-CM | POA: Diagnosis present

## 2023-09-11 DIAGNOSIS — Z85038 Personal history of other malignant neoplasm of large intestine: Secondary | ICD-10-CM

## 2023-09-11 DIAGNOSIS — D6869 Other thrombophilia: Secondary | ICD-10-CM | POA: Diagnosis present

## 2023-09-11 DIAGNOSIS — Z841 Family history of disorders of kidney and ureter: Secondary | ICD-10-CM | POA: Diagnosis not present

## 2023-09-11 DIAGNOSIS — I878 Other specified disorders of veins: Secondary | ICD-10-CM | POA: Diagnosis present

## 2023-09-11 DIAGNOSIS — N1831 Chronic kidney disease, stage 3a: Secondary | ICD-10-CM | POA: Diagnosis present

## 2023-09-11 DIAGNOSIS — Z8249 Family history of ischemic heart disease and other diseases of the circulatory system: Secondary | ICD-10-CM | POA: Diagnosis not present

## 2023-09-11 DIAGNOSIS — Z7901 Long term (current) use of anticoagulants: Secondary | ICD-10-CM

## 2023-09-11 DIAGNOSIS — K5733 Diverticulitis of large intestine without perforation or abscess with bleeding: Secondary | ICD-10-CM | POA: Diagnosis present

## 2023-09-11 LAB — URINALYSIS, ROUTINE W REFLEX MICROSCOPIC
Bacteria, UA: NONE SEEN
Bilirubin Urine: NEGATIVE
Glucose, UA: NEGATIVE mg/dL
Hgb urine dipstick: NEGATIVE
Ketones, ur: NEGATIVE mg/dL
Nitrite: NEGATIVE
Protein, ur: NEGATIVE mg/dL
Specific Gravity, Urine: 1.015 (ref 1.005–1.030)
pH: 5 (ref 5.0–8.0)

## 2023-09-11 LAB — COMPREHENSIVE METABOLIC PANEL WITH GFR
ALT: 5 U/L (ref 0–44)
AST: 12 U/L — ABNORMAL LOW (ref 15–41)
Albumin: 2.4 g/dL — ABNORMAL LOW (ref 3.5–5.0)
Alkaline Phosphatase: 95 U/L (ref 38–126)
Anion gap: 10 (ref 5–15)
BUN: 25 mg/dL — ABNORMAL HIGH (ref 8–23)
CO2: 28 mmol/L (ref 22–32)
Calcium: 8.7 mg/dL — ABNORMAL LOW (ref 8.9–10.3)
Chloride: 103 mmol/L (ref 98–111)
Creatinine, Ser: 1.92 mg/dL — ABNORMAL HIGH (ref 0.61–1.24)
GFR, Estimated: 35 mL/min — ABNORMAL LOW (ref >60)
Glucose, Bld: 92 mg/dL (ref 70–99)
Potassium: 4.3 mmol/L (ref 3.5–5.1)
Sodium: 141 mmol/L (ref 135–145)
Total Bilirubin: 0.6 mg/dL (ref 0.0–1.2)
Total Protein: 6.5 g/dL (ref 6.5–8.1)

## 2023-09-11 LAB — IRON AND TIBC
Iron: 54 ug/dL (ref 45–182)
Saturation Ratios: 27 % (ref 17.9–39.5)
TIBC: 199 ug/dL — ABNORMAL LOW (ref 250–450)
UIBC: 145 ug/dL

## 2023-09-11 LAB — CBC
HCT: 19.9 % — ABNORMAL LOW (ref 39.0–52.0)
Hemoglobin: 5.4 g/dL — CL (ref 13.0–17.0)
MCH: 31.2 pg (ref 26.0–34.0)
MCHC: 27.1 g/dL — ABNORMAL LOW (ref 30.0–36.0)
MCV: 115 fL — ABNORMAL HIGH (ref 80.0–100.0)
Platelets: 161 10*3/uL (ref 150–400)
RBC: 1.73 MIL/uL — ABNORMAL LOW (ref 4.22–5.81)
RDW: 14.1 % (ref 11.5–15.5)
WBC: 6.7 10*3/uL (ref 4.0–10.5)
nRBC: 0 % (ref 0.0–0.2)

## 2023-09-11 LAB — PROTIME-INR
INR: 1.6 — ABNORMAL HIGH (ref 0.8–1.2)
Prothrombin Time: 19.6 s — ABNORMAL HIGH (ref 11.4–15.2)

## 2023-09-11 LAB — FERRITIN: Ferritin: 136 ng/mL (ref 24–336)

## 2023-09-11 LAB — VITAMIN B12: Vitamin B-12: 407 pg/mL (ref 180–914)

## 2023-09-11 LAB — HEMOGLOBIN: Hemoglobin: 12.1 g/dL — ABNORMAL LOW (ref 13.0–17.0)

## 2023-09-11 LAB — PREPARE RBC (CROSSMATCH)

## 2023-09-11 MED ORDER — IOHEXOL 300 MG/ML  SOLN
80.0000 mL | Freq: Once | INTRAMUSCULAR | Status: AC | PRN
  Administered 2023-09-11: 80 mL via INTRAVENOUS

## 2023-09-11 MED ORDER — ONDANSETRON HCL 4 MG/2ML IJ SOLN: 4.0000 mg | Freq: Four times a day (QID) | INTRAMUSCULAR | Status: DC | PRN

## 2023-09-11 MED ORDER — LACTATED RINGERS IV BOLUS
250.0000 mL | Freq: Once | INTRAVENOUS | Status: AC
  Administered 2023-09-11: 250 mL via INTRAVENOUS

## 2023-09-11 MED ORDER — ALBUTEROL SULFATE (2.5 MG/3ML) 0.083% IN NEBU: 2.5000 mg | INHALATION_SOLUTION | RESPIRATORY_TRACT | Status: DC | PRN

## 2023-09-11 MED ORDER — ONDANSETRON HCL 4 MG PO TABS: 4.0000 mg | ORAL_TABLET | Freq: Four times a day (QID) | ORAL | Status: DC | PRN

## 2023-09-11 MED ORDER — AMIODARONE HCL 200 MG PO TABS
100.0000 mg | ORAL_TABLET | Freq: Every day | ORAL | Status: DC
  Administered 2023-09-12 – 2023-09-14 (×3): 100 mg via ORAL
  Filled 2023-09-11 (×5): qty 1

## 2023-09-11 MED ORDER — EMPTY CONTAINERS FLEXIBLE MISC
900.0000 mg | Freq: Once | Status: AC
  Administered 2023-09-11: 900 mg via INTRAVENOUS
  Filled 2023-09-11: qty 90

## 2023-09-11 MED ORDER — ALLOPURINOL 100 MG PO TABS
100.0000 mg | ORAL_TABLET | Freq: Every day | ORAL | Status: DC
  Administered 2023-09-11 – 2023-09-13 (×3): 100 mg via ORAL
  Filled 2023-09-11 (×3): qty 1

## 2023-09-11 MED ORDER — MORPHINE SULFATE (PF) 2 MG/ML IV SOLN
2.0000 mg | INTRAVENOUS | Status: DC | PRN
  Administered 2023-09-11 – 2023-09-14 (×8): 2 mg via INTRAVENOUS
  Filled 2023-09-11 (×8): qty 1

## 2023-09-11 MED ORDER — SODIUM CHLORIDE 0.9% IV SOLUTION: Freq: Once | INTRAVENOUS | Status: AC

## 2023-09-11 MED ORDER — ACETAMINOPHEN 500 MG PO TABS
1000.0000 mg | ORAL_TABLET | Freq: Two times a day (BID) | ORAL | Status: DC
  Administered 2023-09-13: 1000 mg via ORAL
  Filled 2023-09-11 (×4): qty 2

## 2023-09-11 MED ORDER — ATORVASTATIN CALCIUM 20 MG PO TABS
20.0000 mg | ORAL_TABLET | Freq: Every day | ORAL | Status: DC
  Administered 2023-09-11 – 2023-09-13 (×3): 20 mg via ORAL
  Filled 2023-09-11: qty 1
  Filled 2023-09-11: qty 2
  Filled 2023-09-11: qty 1

## 2023-09-11 MED ORDER — PANTOPRAZOLE SODIUM 40 MG PO TBEC
40.0000 mg | DELAYED_RELEASE_TABLET | Freq: Two times a day (BID) | ORAL | Status: DC
  Administered 2023-09-12 – 2023-09-14 (×6): 40 mg via ORAL
  Filled 2023-09-11 (×6): qty 1

## 2023-09-11 NOTE — ED Notes (Signed)
 RN notified by last nurse that IV infiltrated before giving med.

## 2023-09-11 NOTE — ED Triage Notes (Addendum)
 Pt BIB EMS from [facility], c/o rectal bleeding r/t colon cancer. No current treatment plan. Been ongoing for awhile and has medical appointment end of month. Rectal pain that feels like a softball sitting on top. Blood thinner noted.  BP 162/86 P 68 RR 18 SpO2 945

## 2023-09-11 NOTE — H&P (Signed)
 History and Physical    Bradley Stokes WUJ:[id number] DOB: 1945-07-22 DOA: 09/11/2023  PCP: Annamarie Kid, MD  Patient coming from: home  I have personally briefly reviewed patient's old medical records in [facility]  Chief Complaint: rectal bleeding  HPI: Bradley Stokes is a 78 y.o. male with medical history significant of  CKDIII, hx of colon cancer s/p tx, HLD, HTN Chronic Atrial fibrillation on Eliquis  who presents to ED BIBA from facility with complaint of rectal pain and bleeding. Patient states bleeding has been intermittent x 1 weeks but got worse today. Patient notes no associated abdominal pain, n/v/d/ dysuria, fever/chills/sob or chest pain .   ED Course:   Patient noted to have hgb of 5.4 down form 12.4 around 22mo ago . Patient of note with stable blood pressure. Patient was given andexxa for reversal of Eliquis  and transfused 2 PRBC in ED.  Gi consulted and will see patient in am  Vitals:Afeb, BP 156/69 (BP Location: Right Arm)   Pulse 72   Temp 97.6 F (36.4 C) (Oral)   Resp 17   SpO2 94%    Labs: UA neg NA 141, K 4.3, cr 1.92 at baseline Hgb 5.4 ( 12.4)  MCV 115  CTAB/pelvis IMPRESSION: 1. Persistent trace left pleural effusion with question partially visualized 2.5 by 0.8 cm subpleural nodularity. Recommend CT chest with intravenous (not CT PA) contrast for further evaluation. 2. Colonic diverticulosis with question developing or mild acute diverticulitis of the sigmoid colon. Recommend colonoscopy status post treatment and status post complete resolution of inflammatory changes to exclude an underlying lesion. 3. Partial colectomy. 4. Aortic Atherosclerosis (ICD10-I70.0) including coronary calcification.  Tx 250 cc bolus, andexxa     Review of Systems: As per HPI otherwise 10 point review of systems negative.   Past Medical History:  Diagnosis Date   Arthritis    severe arthritis and deformity of his knee's   Chest pain    Chronic  atrial fibrillation (HCC)    Chronic renal disease, stage III (HCC)    Chronic venous stasis dermatitis    colon ca 10/2020   Edema    Gout    History of cardiovascular stress test 01/17/2008   EF- 62%   Hypercholesterolemia    Hyperlipidemia    Hypertension    Kidney stones    Long term (current) use of anticoagulants    Long-term (current) use of anticoagulants    Obesity    Osteoarthritis    PAF (paroxysmal atrial fibrillation) (HCC)    Paroxysmal atrial fibrillation (HCC)    Secondary hypercoagulable state [facility])     Past Surgical History:  Procedure Laterality Date   BIOPSY  12/05/2020   Procedure: BIOPSY;  Surgeon: Tami Falcon, MD;  Location: [facility] ENDOSCOPY;  Service: Endoscopy;;   CARDIOLITE STRESS TEST     EF 62%   COLONOSCOPY N/A 12/05/2020   Procedure: COLONOSCOPY;  Surgeon: Tami Falcon, MD;  Location: [facility] ENDOSCOPY;  Service: Endoscopy;  Laterality: N/A;   ESOPHAGOGASTRODUODENOSCOPY (EGD) WITH PROPOFOL  N/A 12/05/2020   Procedure: ESOPHAGOGASTRODUODENOSCOPY (EGD) WITH PROPOFOL ;  Surgeon: Tami Falcon, MD;  Location: [facility] ENDOSCOPY;  Service: Endoscopy;  Laterality: N/A;   LAPAROSCOPIC LOW ANTERIOR RESCECTION WITH COLOANAL ANASTOMOSIS  12/08/2020   Procedure: LAPAROSCOPIC LOW ANTERIOR RESCECTION WITH COLOANAL ANASTOMOSIS;  Surgeon: Dorrie Gaudier Alphonso Aschoff, MD;  Location: [facility] OR;  Service: General;;   LAPAROSCOPIC PARTIAL COLECTOMY N/A 12/08/2020   Procedure: LAPAROSCOPIC ASSISTED POSSIBLE OPEN PARTIAL COLECTOMY WITH POSSIBLE OSTOMY;  Surgeon: Dorrie Gaudier,  Alphonso Aschoff, MD;  Location: [facility] OR;  Service: General;  Laterality: N/A;   POLYPECTOMY  12/05/2020   Procedure: POLYPECTOMY;  Surgeon: Tami Falcon, MD;  Location: [facility] ENDOSCOPY;  Service: Endoscopy;;   SUBMUCOSAL TATTOO INJECTION  12/05/2020   Procedure: SUBMUCOSAL TATTOO INJECTION;  Surgeon: Tami Falcon, MD;  Location: [name] ENDOSCOPY;  Service: Endoscopy;;     reports that he has never smoked. He has never used smokeless tobacco. He  reports that he does not drink alcohol and does not use drugs.  Allergies  Allergen Reactions   Tape Other (See Comments)    Paper tape ONLY   Wound Dressing Adhesive Itching and Other (See Comments)    Redness, also    Family History  Problem Relation Age of Onset   Pneumonia Mother    Heart failure Mother    Kidney failure Sister    Heart disease Brother    Crohn's disease Sister     Prior to Admission medications   Medication Sig Start Date End Date Taking? Authorizing Provider  acetaminophen  (TYLENOL ) 500 MG tablet Take 1,000 mg by mouth See admin instructions. Take 1,000 mg by mouth twice a day and an additional 1,000 mg every eight hours as needed for pain   Yes [provider]  allopurinol  (ZYLOPRIM ) 100 MG tablet Take 100 mg by mouth at bedtime.   Yes [provider]  Amino Acids-Protein Hydrolys (PRO-STAT) LIQD Take 30 mLs by mouth daily.   Yes [provider]  amiodarone  (PACERONE ) 100 MG tablet Take 100 mg by mouth daily. 05/23/21  Yes [provider]  ammonium lactate  (LAC-HYDRIN ) 12 % lotion Apply 1 application  topically See admin instructions. Apply to bilateral lower extremities and tops of the feet once a day (after cleansing/rinsing/drying)   Yes [provider]  apixaban  (ELIQUIS ) 5 MG TABS tablet Take 1 tablet (5 mg total) by mouth 2 (two) times daily. 12/13/20  Yes Ghimire, Estil Heman, MD  atorvastatin  (LIPITOR) 20 MG tablet Take 20 mg by mouth at bedtime. 02/21/11  Yes [provider]  celecoxib (CELEBREX) 100 MG capsule Take 100 mg by mouth daily.   Yes [provider]  cholecalciferol (VITAMIN D3) 25 MCG (1000 UNIT) tablet Take 2,000 Units by mouth daily.   Yes [provider]  Cyanocobalamin  (VITAMIN B-12) 5000 MCG SUBL Place 5,000 mcg under the tongue daily.   Yes [provider]  furosemide  (LASIX ) 20 MG tablet Take 20 mg by mouth every other day.   Yes [provider]  K  Phos Mono-Sod Phos Di & Mono (PHOSPHA 250 NEUTRAL) 155-852-130 MG TABS Take 1 tablet by mouth at bedtime.   Yes [provider]  loratadine (CLARITIN) 10 MG tablet Take 10 mg by mouth daily.   Yes [provider]  mineral oil-hydrophilic petrolatum (AQUAPHOR) ointment Apply 1 Application topically See admin instructions. Apply to bilateral lower extremities and tops of the feet once a day (after cleansing/rinsing/drying)   Yes [provider]  ondansetron  (ZOFRAN ) 4 MG tablet Take 4 mg by mouth every 6 (six) hours as needed for nausea or vomiting.   Yes [provider]  pantoprazole  (PROTONIX ) 40 MG tablet Take 1 tablet (40 mg total) by mouth daily. Patient taking differently: Take 40 mg by mouth 2 (two) times daily before a meal. 12/14/20  Yes Ghimire, Estil Heman, MD  sennosides-docusate sodium  (SENOKOT-S) 8.6-50 MG tablet Take 1 tablet by mouth See admin instructions. Take 1 tablet by mouth at bedtime-  hold if having loose stools   Yes [provider]  Nutritional Supplements (,FEEDING SUPPLEMENT, PROSOURCE PLUS) liquid Take 30 mLs by mouth 3 (three) times daily between meals. Patient not taking: Reported on 09/11/2023 12/13/20   Burton Casey, MD  potassium chloride  SA (KLOR-CON ) 20 MEQ tablet TAKE 1 TABLET BY MOUTH DAILY Patient not taking: Reported on 09/11/2023 07/20/20   Hugh Madura, MD    Physical Exam: Vitals:   09/11/23 1645 09/11/23 1800 09/11/23 1830 09/11/23 1845  BP: (!) 156/69 (!) 159/61 (!) 171/61 (!) 172/60  Pulse: 72 71 68 66  Resp: 17 14 17 16   Temp: 97.6 F (36.4 C) 97.9 F (36.6 C) 98.1 F (36.7 C)   TempSrc: Oral Oral Oral   SpO2: 94% 94% 94% 95%    Constitutional: NAD, calm, comfortable Vitals:   09/11/23 1645 09/11/23 1800 09/11/23 1830 09/11/23 1845  BP: (!) 156/69 (!) 159/61 (!) 171/61 (!) 172/60  Pulse: 72 71 68 66  Resp: 17 14 17 16   Temp: 97.6 F (36.4 C) 97.9 F (36.6 C) 98.1 F (36.7 C)   TempSrc: Oral  Oral Oral   SpO2: 94% 94% 94% 95%   Eyes: PERRL, lids and conjunctivae normal ENMT: Mucous membranes are moist. Posterior pharynx clear of any exudate or lesions.Normal dentition.  Neck: normal, supple, no masses, no thyromegaly Respiratory: clear to auscultation bilaterally, no wheezing, no crackles. Normal respiratory effort. No accessory muscle use.  Cardiovascular: Regular rate and rhythm, no murmurs / rubs / gallops. No extremity edema. 2+ pedal pulses. No carotid bruits.  Abdomen: no tenderness, no masses palpated. No hepatosplenomegaly. Bowel sounds positive.  Musculoskeletal: no clubbing / cyanosis. No joint deformity upper and lower extremities. Good ROM, no contractures. Normal muscle tone.  Skin: no rashes, lesions, ulcers. No induration Neurologic: CN 2-12 grossly intact. Sensation intact, DTR normal. Strength 5/5 in all 4.  Psychiatric: Normal judgment and insight. Alert and oriented x 3. Normal mood.    Labs on Admission: I have personally reviewed following labs and imaging studies  CBC: Recent Labs  Lab 09/11/23 1557  WBC 6.7  HGB 5.4*  HCT 19.9*  MCV 115.0*  PLT 161   Basic Metabolic Panel: Recent Labs  Lab 09/11/23 1557  NA 141  K 4.3  CL 103  CO2 28  GLUCOSE 92  BUN 25*  CREATININE 1.92*  CALCIUM  8.7*   GFR: CrCl cannot be calculated (Unknown ideal weight.). Liver Function Tests: Recent Labs  Lab 09/11/23 1557  AST 12*  ALT 5  ALKPHOS 95  BILITOT 0.6  PROT 6.5  ALBUMIN 2.4*   No results for input(s): LIPASE, AMYLASE in the last 168 hours. No results for input(s): AMMONIA in the last 168 hours. Coagulation Profile: No results for input(s): INR, PROTIME in the last 168 hours. Cardiac Enzymes: No results for input(s): CKTOTAL, CKMB, CKMBINDEX, TROPONINI in the last 168 hours. BNP (last 3 results) No results for input(s): PROBNP in the last 8760 hours. HbA1C: No results for input(s): HGBA1C in the last 72  hours. CBG: No results for input(s): GLUCAP in the last 168 hours. Lipid Profile: No results for input(s): CHOL, HDL, LDLCALC, TRIG, CHOLHDL, LDLDIRECT in the last 72 hours. Thyroid  Function Tests: No results for input(s): TSH, T4TOTAL, FREET4, T3FREE, THYROIDAB in the last 72 hours. Anemia Panel: No results for input(s): VITAMINB12, FOLATE, FERRITIN, TIBC, IRON, RETICCTPCT in the last 72 hours. Urine analysis:    Component Value Date/Time   COLORURINE YELLOW 09/11/2023 1422  APPEARANCEUR CLEAR 09/11/2023 1422   LABSPEC 1.015 09/11/2023 1422   PHURINE 5.0 09/11/2023 1422   GLUCOSEU NEGATIVE 09/11/2023 1422   HGBUR NEGATIVE 09/11/2023 1422   BILIRUBINUR NEGATIVE 09/11/2023 1422   KETONESUR NEGATIVE 09/11/2023 1422   PROTEINUR NEGATIVE 09/11/2023 1422   NITRITE NEGATIVE 09/11/2023 1422   LEUKOCYTESUR TRACE (A) 09/11/2023 1422    Radiological Exams on Admission: CT ABDOMEN PELVIS W CONTRAST Result Date: 09/11/2023 CLINICAL DATA:  Abdominal pain, acute, nonlocalized c/o rectal bleeding r/t colon cancer. No current treatment plan. Been ongoing for awhile and has medical appointment end of month. Rectal pain that feels like a softball sitting on top. Blood thinner noted. History colon cancer. EXAM: CT ABDOMEN AND PELVIS WITH CONTRAST TECHNIQUE: Multidetector CT imaging of the abdomen and pelvis was performed using the standard protocol following bolus administration of intravenous contrast. RADIATION DOSE REDUCTION: This exam was performed according to the departmental dose-optimization program which includes automated exposure control, adjustment of the mA and/or kV according to patient size and/or use of iterative reconstruction technique. CONTRAST:  80mL OMNIPAQUE  IOHEXOL  300 MG/ML  SOLN COMPARISON:  CT abdomen pelvis 06/22/2023 FINDINGS: Lower chest: Persistent trace left pleural effusion with question partially visualized 2.5 by 0.8 cm subpleural  nodularity. Coronary artery calcification. Hepatobiliary: No focal liver abnormality. No gallstones, gallbladder wall thickening, or pericholecystic fluid. No biliary dilatation. Pancreas: No focal lesion. Normal pancreatic contour. No surrounding inflammatory changes. No main pancreatic ductal dilatation. Spleen: Normal in size without focal abnormality. Adrenals/Urinary Tract: No adrenal nodule bilaterally. Bilateral kidneys enhance symmetrically. No nephroureterolithiasis bilaterally. No hydronephrosis. No hydroureter. Left parapelvic fluid density lesion likely represents a simple renal cyst. Simple renal cysts, in the absence of clinically indicated signs/symptoms, require no independent follow-up. The urinary bladder is unremarkable. On delayed imaging, there is no urothelial wall thickening and there are no filling defects in the opacified portions of the bilateral collecting systems or ureters. Stomach/Bowel: Partial colectomy. Stomach is within normal limits. No evidence of small bowel wall thickening or dilatation. Interval development of mild mid sigmoid colon bowel wall thickening with trace pericolonic and perirectal fat stranding. Redemonstration of second portion of the duodenum diverticula. Colonic diverticulosis. Appendix appears normal. Vascular/Lymphatic: No abdominal aorta or iliac aneurysm. Moderate atherosclerotic plaque of the aorta and its branches. No abdominal, pelvic, or inguinal lymphadenopathy. Reproductive: Prostate is unremarkable. Other: No intraperitoneal free fluid. No intraperitoneal free gas. No organized fluid collection. Musculoskeletal: No abdominal wall hernia or abnormality. No suspicious lytic or blastic osseous lesions. No acute displaced fracture. Multilevel degenerative changes of the spine. IMPRESSION: 1. Persistent trace left pleural effusion with question partially visualized 2.5 by 0.8 cm subpleural nodularity. Recommend CT chest with intravenous (not CT PA) contrast  for further evaluation. 2. Colonic diverticulosis with question developing or mild acute diverticulitis of the sigmoid colon. Recommend colonoscopy status post treatment and status post complete resolution of inflammatory changes to exclude an underlying lesion. 3. Partial colectomy. 4. Aortic Atherosclerosis (ICD10-I70.0) including coronary calcification. Electronically Signed   By: Morgane  Naveau M.D.   On: 09/11/2023 17:34    EKG: Independently reviewed. ***  Assessment/Plan Principal Problem:   Acute gastrointestinal bleeding   Acute gastrointestinal bleeding , presumed diverticular in origin  - hgb 5.4 , 2 unit of PRBC ordered  -s/p Adenxxa for Eliquis  reversal - npo  -serial hbg  -f/u with gi rec in am vs IR if patient has continued bleed and low h/h despite transfusion or become symptomatic.   CKDIII -at baseline  Hx of colon cancer  -s/p  right colectomy 9/22 -followed by oncology  -no noted recurrent on recent surveillance   HLD -continue statin   HTN -stable  -hold hypertensive medications currently in setting of bleeding    Chronic Atrial fibrillation -holding Eliquis  -continue amiodarone  for rate control     DVT prophylaxis: scd Code Status: full/ as discussed per patient wishes in event of cardiac arrest  Family Communication: none at bedside Disposition Plan: patient  expected to be admitted greater than 2 midnights  Consults called:GI  Admission status: progressive care   Sabas Cradle MD [facility]   If 7PM-7AM, please contact night-coverage [email or web address] Password [facility]  09/11/2023, 8:05 PM

## 2023-09-11 NOTE — ED Notes (Signed)
 RN had to discontinue both lines, 1 new line started, attempting to get another USGIV.

## 2023-09-11 NOTE — ED Provider Notes (Signed)
 [place] EMERGENCY DEPARTMENT AT [facility] Provider Note   CSN: [id number] Arrival date & time: 09/11/23  1242     Patient presents with: Rectal Bleeding   Bradley Stokes is a 78 y.o. male.   HPI   78 year old male history of colon cancer, A-fib, chronically anticoagulated, presents today complaining of rectal bleeding.  He states that he has had intermittent bleeding from his rectum over the past week.  He he denies abdominal pain nausea, vomiting, diarrhea, chest pain, weakness, or shortness of breath.  Prior to Admission medications   Medication Sig Start Date End Date Taking? Authorizing Provider  acetaminophen  (TYLENOL ) 325 MG tablet Take 325 mg by mouth every 6 (six) hours as needed (pain).     [provider]  allopurinol  (ZYLOPRIM ) 100 MG tablet Take 100 mg by mouth daily.    [provider]  amiodarone  (PACERONE ) 100 MG tablet Take 100 mg by mouth daily. 05/23/21   [provider]  ammonium lactate  (LAC-HYDRIN ) 12 % lotion Apply 1 application topically 2 (two) times daily.    [provider]  apixaban  (ELIQUIS ) 5 MG TABS tablet Take 1 tablet (5 mg total) by mouth 2 (two) times daily. 12/13/20   Ghimire, Estil Heman, MD  atorvastatin  (LIPITOR) 20 MG tablet Take 20 mg by mouth Daily. 02/21/11   [provider]  cholecalciferol (VITAMIN D3) 25 MCG (1000 UNIT) tablet Take 2 tablets by mouth daily. Take 1 tablet at breakfast and 1 tablet at dinner    [provider]  ferrous sulfate 325 (65 FE) MG EC tablet Take 1 tablet by mouth daily with breakfast.    [provider]  loperamide (IMODIUM A-D) 2 MG tablet Take 2 mg by mouth as needed for diarrhea or loose stools.    [provider]  loratadine (CLARITIN) 10 MG tablet Take 1 tablet by mouth daily.    [provider]  Nutritional Supplements (,FEEDING SUPPLEMENT, PROSOURCE PLUS) liquid Take 30 mLs by mouth 3 (three) times daily between  meals. 12/13/20   Ghimire, Estil Heman, MD  pantoprazole  (PROTONIX ) 40 MG tablet Take 1 tablet (40 mg total) by mouth daily. 12/14/20   Ghimire, Estil Heman, MD  potassium chloride  SA (KLOR-CON ) 20 MEQ tablet TAKE 1 TABLET BY MOUTH DAILY Patient taking differently: Take 20 mEq by mouth daily. TAKE 1 TABLET BY MOUTH DAILY 07/20/20   Hugh Madura, MD  vitamin B-12 (CYANOCOBALAMIN ) 100 MCG tablet Take 1 tablet by mouth daily.    [provider]    Allergies: Other    Review of Systems  Updated Vital Signs BP (!) 156/69 (BP Location: Right Arm)   Pulse 72   Temp 97.6 F (36.4 C) (Oral)   Resp 17   SpO2 94%   Physical Exam Vitals and nursing note reviewed.  Constitutional:      Appearance: Normal appearance. He is obese.  HENT:     Head: Normocephalic and atraumatic.     Right Ear: External ear normal.     Left Ear: External ear normal.     Nose: Nose normal.     Mouth/Throat:     Pharynx: Oropharynx is clear.   Eyes:     Comments: Conjunctiva are pale   Cardiovascular:     Rate and Rhythm: Normal rate.     Pulses: Normal pulses.  Pulmonary:     Effort: Pulmonary effort is normal.  Abdominal:     Palpations: Abdomen is soft.  Tenderness: There is abdominal tenderness.     Comments: Diffuse lower quadrant tenderness to palpation no rebound noted  Genitourinary:    Comments: Obvious blood around rectum  Musculoskeletal:        General: Normal range of motion.     Cervical back: Normal range of motion.   Skin:    General: Skin is warm.     Capillary Refill: Capillary refill takes less than 2 seconds.     Coloration: Skin is pale.   Neurological:     Mental Status: He is alert.     Comments: Patient awake alert and oriented.    (all labs ordered are listed, but only abnormal results are displayed) Labs Reviewed  CBC - Abnormal; Notable for the following components:      Result Value   RBC 1.73 (*)    Hemoglobin 5.4 (*)    HCT 19.9 (*)    MCV 115.0 (*)     MCHC 27.1 (*)    All other components within normal limits  COMPREHENSIVE METABOLIC PANEL WITH GFR - Abnormal; Notable for the following components:   BUN 25 (*)    Creatinine, Ser 1.92 (*)    Calcium  8.7 (*)    Albumin 2.4 (*)    AST 12 (*)    GFR, Estimated 35 (*)    All other components within normal limits  URINALYSIS, ROUTINE W REFLEX MICROSCOPIC - Abnormal; Notable for the following components:   Leukocytes,Ua TRACE (*)    All other components within normal limits  PROTIME-INR  TYPE AND SCREEN  PREPARE RBC (CROSSMATCH)    EKG: None  Radiology: No results found.   Aaron AasUltrasound ED Peripheral IV (Provider)  Date/Time: 09/11/2023 3:46 PM  Performed by: Auston Blush, MD Authorized by: Auston Blush, MD   .Critical Care  Performed by: Auston Blush, MD Authorized by: Auston Blush, MD   Critical care provider statement:    Critical care time (minutes):  60   Critical care end time:  09/11/2023 5:08 PM   Critical care time was exclusive of:  Separately billable procedures and treating other patients and teaching time   Critical care was time spent personally by me on the following activities:  Development of treatment plan with patient or surrogate, discussions with consultants, evaluation of patient's response to treatment, examination of patient, ordering and review of laboratory studies, ordering and review of radiographic studies, ordering and performing treatments and interventions, pulse oximetry, re-evaluation of patient's condition and review of old charts    Medications Ordered in the ED  0.9 %  sodium chloride  infusion (Manually program via Guardrails IV Fluids) (has no administration in time range)  coag fact Xa recombinant (ANDEXXA) low dose infusion 900 mg (has no administration in time range)  lactated ringers  bolus 250 mL (0 mLs Intravenous Stopped 09/11/23 1632)  iohexol  (OMNIPAQUE ) 300 MG/ML solution 80 mL (80 mLs Intravenous Contrast Given 09/11/23 1706)     Clinical Course as of 09/11/23 1709  Fri Sep 11, 2023  1707 CBC reviewed interpreted significant for severe anemia hemoglobin of 5.4  [DR]  1708 Complete metabolic panel is reviewed and interpreted significant for elevated creatinine at 1.92 however improved from first prior [DR]    Clinical Course User Index [DR] Auston Blush, MD                                 Medical Decision Making Amount and/or Complexity of  Data Reviewed Labs: ordered. Radiology: ordered.  Risk Prescription drug management.   5:09 PM Peripheral us  placed left hand #22 with good blood return and flow 78 year old male on Eliquis  presents today with rectal bleeding and abdominal pain.  On evaluation here his hemoglobin is low at 5.4.  He is having active rectal bleeding here in the ED.  2 units of packed red blood cells are ordered.  Reversal of Eliquis  is ordered. Patient is currently in CT scanner being evaluated due to the lower abdominal pain. Patient has been stable here with heart rate of 72 and blood pressure of 156/69. Care discussed with Dr. Zackowski who will reevaluate after patient CT scan and patient will require admission     Final diagnoses:  Rectal bleeding  Chronic anticoagulation    ED Discharge Orders     None          Auston Blush, MD 09/11/23 1709

## 2023-09-11 NOTE — ED Provider Notes (Addendum)
 Patient on Eliquis .  Required reversal.  For significant GI bleed.  Hemoglobin down to 5.4.  Patient receiving blood transfusion.  Followed by Dr. Tova Fresh from gastroenterology in the past.  Does have a history of colon cancer but has been in remission.  Patient currently hemodynamically stable but does appear pale.  CT scan was pending.  That just showed some concerns about trace pleural effusion recommended maybe CT chest with IV contrast for further evaluation but based on his GI bleed in the requiring blood transfusions and all we will hold off on that for this moment we will get him admitted by hospitalist and will have gastroenterology consult.  CT does show evidence of a partial colectomy.  Some question of maybe mild acute diverticulitis of the sigmoid colon.  But he definitely does have colonic diverticulosis which could be the cause of the bleed.  Patient does not have a leukocytosis.  Patient's renal function creatinine 1.92 for GFR 35.  Potassium is normal.   Bradley Barns, Bradley Stokes 09/11/23 1833   Discussed with Dr. Anthoney Kipper who is on-call for Dr. Tova Fresh.  They will see patient in consultation.   Bradley Bogen, Bradley Stokes 09/11/23 [phone]

## 2023-09-12 ENCOUNTER — Other Ambulatory Visit: Payer: Self-pay

## 2023-09-12 DIAGNOSIS — K922 Gastrointestinal hemorrhage, unspecified: Secondary | ICD-10-CM | POA: Diagnosis not present

## 2023-09-12 LAB — CBC
HCT: 42.4 % (ref 39.0–52.0)
Hemoglobin: 13.1 g/dL (ref 13.0–17.0)
MCH: 30.8 pg (ref 26.0–34.0)
MCHC: 30.9 g/dL (ref 30.0–36.0)
MCV: 99.8 fL (ref 80.0–100.0)
Platelets: 291 10*3/uL (ref 150–400)
RBC: 4.25 MIL/uL (ref 4.22–5.81)
RDW: 15.9 % — ABNORMAL HIGH (ref 11.5–15.5)
WBC: 9.4 10*3/uL (ref 4.0–10.5)
nRBC: 0 % (ref 0.0–0.2)

## 2023-09-12 LAB — COMPREHENSIVE METABOLIC PANEL WITH GFR
ALT: 7 U/L (ref 0–44)
AST: 9 U/L — ABNORMAL LOW (ref 15–41)
Albumin: 2.5 g/dL — ABNORMAL LOW (ref 3.5–5.0)
Alkaline Phosphatase: 96 U/L (ref 38–126)
Anion gap: 11 (ref 5–15)
BUN: 24 mg/dL — ABNORMAL HIGH (ref 8–23)
CO2: 27 mmol/L (ref 22–32)
Calcium: 8.8 mg/dL — ABNORMAL LOW (ref 8.9–10.3)
Chloride: 102 mmol/L (ref 98–111)
Creatinine, Ser: 1.83 mg/dL — ABNORMAL HIGH (ref 0.61–1.24)
GFR, Estimated: 37 mL/min — ABNORMAL LOW (ref >60)
Glucose, Bld: 76 mg/dL (ref 70–99)
Potassium: 3.9 mmol/L (ref 3.5–5.1)
Sodium: 140 mmol/L (ref 135–145)
Total Bilirubin: 1.1 mg/dL (ref 0.0–1.2)
Total Protein: 6.6 g/dL (ref 6.5–8.1)

## 2023-09-12 LAB — HEMOGLOBIN
Hemoglobin: 12.6 g/dL — ABNORMAL LOW (ref 13.0–17.0)
Hemoglobin: 13.5 g/dL (ref 13.0–17.0)

## 2023-09-12 LAB — MRSA NEXT GEN BY PCR, NASAL: MRSA by PCR Next Gen: NOT DETECTED

## 2023-09-12 MED ORDER — PIPERACILLIN-TAZOBACTAM 3.375 G IVPB
3.3750 g | Freq: Three times a day (TID) | INTRAVENOUS | Status: DC
  Administered 2023-09-12 – 2023-09-14 (×7): 3.375 g via INTRAVENOUS
  Filled 2023-09-12 (×7): qty 50

## 2023-09-12 MED ORDER — SODIUM CHLORIDE 0.45 % IV SOLN: INTRAVENOUS | Status: AC

## 2023-09-12 NOTE — Plan of Care (Signed)
   Problem: Education: Goal: Knowledge of General Education information will improve Description Including pain rating scale, medication(s)/side effects and non-pharmacologic comfort measures Outcome: Progressing   Problem: Health Behavior/Discharge Planning: Goal: Ability to manage health-related needs will improve Outcome: Progressing

## 2023-09-12 NOTE — Consult Note (Signed)
 Reason for Consult:Rectal bleeding x 1 week. Referring Physician: THP.  Bradley Stokes is an 78 y.o. male.  HPI: [name] is a 78 year old white male with stage IIIb colon cancer diagnosed in September 2022.  He was seen and evaluated by Dr. Grayland Le and 2022 but was not thought to be a candidate for chemotherapy. Patient presented to the emergency room with a 1 week history of intermittent rectal bleeding in the ER he was noted to have a hemoglobin of 5.4 g/dL and received 2 units of blood and the repeat hemoglobin after transfusion was 12.56 g/dL.  He has been on Eliquis  for atrial fibrillation.  Prior to admission and that has been on hold. On CT scan and in the ER he was noted to have colonic diverticulosis with mild acute diverticulitis of the sigmoid colon along with persistent trace left pleural effusion and a 0.8 cm subpleural nodularity in the left lung.  Diagnosis Date   Arthritis    severe arthritis and deformity of his knee's   Chest pain    Chronic atrial fibrillation (HCC)    Chronic renal disease, stage III (HCC)    Chronic venous stasis dermatitis    colon ca 10/2020   Edema    Gout    History of cardiovascular stress test 01/17/2008   EF- 62%   Hypercholesterolemia    Hyperlipidemia    Hypertension    Kidney stones    Long-term (current) use of anticoagulants    Obesity    Osteoarthritis    PAF (paroxysmal atrial fibrillation) (HCC)    Secondary hypercoagulable state [facility])    Past Surgical History:  Procedure Laterality Date   BIOPSY  12/05/2020   Procedure: BIOPSY;  Surgeon: Tami Falcon, MD;  Location: [facility] ENDOSCOPY;  Service: Endoscopy;;   CARDIOLITE STRESS TEST     EF 62%   COLONOSCOPY N/A 12/05/2020   Procedure: COLONOSCOPY;  Surgeon: Tami Falcon, MD;  Location: [facility] ENDOSCOPY;  Service: Endoscopy;  Laterality: N/A;   ESOPHAGOGASTRODUODENOSCOPY (EGD) WITH PROPOFOL  N/A 12/05/2020   Procedure: ESOPHAGOGASTRODUODENOSCOPY (EGD) WITH PROPOFOL ;  Surgeon: Tami Falcon, MD;  Location: [facility] ENDOSCOPY;  Service: Endoscopy;  Laterality: N/A;   LAPAROSCOPIC LOW ANTERIOR RESCECTION WITH COLOANAL ANASTOMOSIS  12/08/2020   Procedure: LAPAROSCOPIC LOW ANTERIOR RESCECTION WITH COLOANAL ANASTOMOSIS;  Surgeon: Dorrie Gaudier, Alphonso Aschoff, MD;  Location: [facility] OR;  Service: General;;   LAPAROSCOPIC PARTIAL COLECTOMY N/A 12/08/2020   Procedure: LAPAROSCOPIC ASSISTED POSSIBLE OPEN PARTIAL COLECTOMY WITH POSSIBLE OSTOMY;  Surgeon: Dorrie Gaudier Alphonso Aschoff, MD;  Location: [facility] OR;  Service: General;  Laterality: N/A;   POLYPECTOMY  12/05/2020   Procedure: POLYPECTOMY;  Surgeon: Tami Falcon, MD;  Location: [facility] ENDOSCOPY;  Service: Endoscopy;;   SUBMUCOSAL TATTOO INJECTION  12/05/2020   Procedure: SUBMUCOSAL TATTOO INJECTION;  Surgeon: Tami Falcon, MD;  Location: [facility] ENDOSCOPY;  Service: Endoscopy;;   Family History  Problem Relation Age of Onset   Pneumonia Mother    Heart failure Mother    Kidney failure Sister    Heart disease Brother    Crohn's disease Sister    Social History:  reports that he has never smoked. He has never used smokeless tobacco. He reports that he does not drink alcohol and does not use drugs.  Allergies:  Allergies  Allergen Reactions   Tape Other (See Comments)    Paper tape ONLY   Wound Dressing Adhesive Itching and Other (See Comments)    Redness, also    Medications: I have reviewed the patient's  current medications. Prior to Admission:  Medications Prior to Admission  Medication Sig Dispense Refill Last Dose/Taking   acetaminophen  (TYLENOL ) 500 MG tablet Take 1,000 mg by mouth See admin instructions. Take 1,000 mg by mouth twice a day and an additional 1,000 mg every eight hours as needed for pain   Unknown   allopurinol  (ZYLOPRIM ) 100 MG tablet Take 100 mg by mouth at bedtime.   Unknown   Amino Acids-Protein Hydrolys (PRO-STAT) LIQD Take 30 mLs by mouth daily.   Unknown   amiodarone  (PACERONE ) 100 MG tablet Take 100 mg by mouth daily.   Unknown    ammonium lactate  (LAC-HYDRIN ) 12 % lotion Apply 1 application  topically See admin instructions. Apply to bilateral lower extremities and tops of the feet once a day (after cleansing/rinsing/drying)   Unknown   apixaban  (ELIQUIS ) 5 MG TABS tablet Take 1 tablet (5 mg total) by mouth 2 (two) times daily. 60 tablet  Unknown   atorvastatin  (LIPITOR) 20 MG tablet Take 20 mg by mouth at bedtime.   Unknown   celecoxib (CELEBREX) 100 MG capsule Take 100 mg by mouth daily.   Unknown   cholecalciferol (VITAMIN D3) 25 MCG (1000 UNIT) tablet Take 2,000 Units by mouth daily.   Unknown   Cyanocobalamin  (VITAMIN B-12) 5000 MCG SUBL Place 5,000 mcg under the tongue daily.   Unknown   furosemide  (LASIX ) 20 MG tablet Take 20 mg by mouth every other day.   Unknown   K Phos  Mono-Sod Phos Di & Mono (PHOSPHA 250 NEUTRAL) 155-852-130 MG TABS Take 1 tablet by mouth at bedtime.   Unknown   loratadine (CLARITIN) 10 MG tablet Take 10 mg by mouth daily.   Unknown   mineral oil-hydrophilic petrolatum (AQUAPHOR) ointment Apply 1 Application topically See admin instructions. Apply to bilateral lower extremities and tops of the feet once a day (after cleansing/rinsing/drying)   Unknown   ondansetron  (ZOFRAN ) 4 MG tablet Take 4 mg by mouth every 6 (six) hours as needed for nausea or vomiting.   Unknown   pantoprazole  (PROTONIX ) 40 MG tablet Take 1 tablet (40 mg total) by mouth daily. (Patient taking differently: Take 40 mg by mouth 2 (two) times daily before a meal.)   Unknown   sennosides-docusate sodium  (SENOKOT-S) 8.6-50 MG tablet Take 1 tablet by mouth See admin instructions. Take 1 tablet by mouth at bedtime- hold if having loose stools   Unknown   Nutritional Supplements (,FEEDING SUPPLEMENT, PROSOURCE PLUS) liquid Take 30 mLs by mouth 3 (three) times daily between meals. (Patient not taking: Reported on 09/11/2023)   Not Taking   potassium chloride  SA (KLOR-CON ) 20 MEQ tablet TAKE 1 TABLET BY MOUTH DAILY (Patient not taking:  Reported on 09/11/2023) 90 tablet 3 Not Taking   Scheduled:  acetaminophen   1,000 mg Oral Q12H   allopurinol   100 mg Oral QHS   amiodarone   100 mg Oral Daily   atorvastatin   20 mg Oral QHS   pantoprazole   40 mg Oral BID AC   Continuous:  sodium chloride  75 mL/hr at 09/12/23 1029   PRN:albuterol , morphine  injection, ondansetron  **OR** ondansetron  (ZOFRAN ) IV  Results for orders placed or performed during the hospital encounter of 09/11/23 (from the past 48 hours)  Urinalysis, Routine w reflex microscopic -Urine, Clean Catch     Status: Abnormal   Collection Time: 09/11/23  2:22 PM  Result Value Ref Range   Color, Urine YELLOW YELLOW   APPearance CLEAR CLEAR   Specific Gravity, Urine 1.015 1.005 -  1.030   pH 5.0 5.0 - 8.0   Glucose, UA NEGATIVE NEGATIVE mg/dL   Hgb urine dipstick NEGATIVE NEGATIVE   Bilirubin Urine NEGATIVE NEGATIVE   Ketones, ur NEGATIVE NEGATIVE mg/dL   Protein, ur NEGATIVE NEGATIVE mg/dL   Nitrite NEGATIVE NEGATIVE   Leukocytes,Ua TRACE (A) NEGATIVE   RBC / HPF 0-5 0 - 5 RBC/hpf   WBC, UA 6-10 0 - 5 WBC/hpf   Bacteria, UA NONE SEEN NONE SEEN   Squamous Epithelial / HPF 0-5 0 - 5 /HPF   Mucus PRESENT    Hyaline Casts, UA PRESENT     Comment: Performed at [facility], [address], [place], [place] [address]  Type and screen [facility]     Status: None (Preliminary result)   Collection Time: 09/11/23  3:45 PM  Result Value Ref Range   ABO/RH(D) O NEG    Antibody Screen NEG    Sample Expiration 09/14/2023,2359    Unit Number [id number]    Blood Component Type RBC LR PHER2    Unit division 00    Status of Unit ISSUED    Transfusion Status OK TO TRANSFUSE    Crossmatch Result      Compatible Performed at [facility], [address], [place], [place] [address]    Unit Number [id number]    Blood Component Type RED CELLS,LR    Unit division 00    Status of Unit ISSUED    Transfusion  Status OK TO TRANSFUSE    Crossmatch Result Compatible   CBC     Status: Abnormal   Collection Time: 09/11/23  3:57 PM  Result Value Ref Range   WBC 6.7 4.0 - 10.5 K/uL   RBC 1.73 (L) 4.22 - 5.81 MIL/uL   Hemoglobin 5.4 (LL) 13.0 - 17.0 g/dL    Comment: CRITICAL RESULT CALLED TO, READ BACK BY AND VERIFIED WITH: SONYA L., RN AT 1649 09/11/23 RAM    HCT 19.9 (L) 39.0 - 52.0 %   MCV 115.0 (H) 80.0 - 100.0 fL   MCH 31.2 26.0 - 34.0 pg   MCHC 27.1 (L) 30.0 - 36.0 g/dL   RDW 03.4 74.2 - 59.5 %   Platelets 161 150 - 400 K/uL   nRBC 0.0 0.0 - 0.2 %    Comment: Performed at [facility], [address] [place], [place] [address]  Comprehensive metabolic panel     Status: Abnormal   Collection Time: 09/11/23  3:57 PM  Result Value Ref Range   Sodium 141 135 - 145 mmol/L   Potassium 4.3 3.5 - 5.1 mmol/L   Chloride 103 98 - 111 mmol/L   CO2 28 22 - 32 mmol/L   Glucose, Bld 92 70 - 99 mg/dL    Comment: Glucose reference range applies only to samples taken after fasting for at least 8 hours.   BUN 25 (H) 8 - 23 mg/dL   Creatinine, Ser 6.43 (H) 0.61 - 1.24 mg/dL   Calcium  8.7 (L) 8.9 - 10.3 mg/dL   Total Protein 6.5 6.5 - 8.1 g/dL   Albumin 2.4 (L) 3.5 - 5.0 g/dL   AST 12 (L) 15 - 41 U/L   ALT 5 0 - 44 U/L   Alkaline Phosphatase 95 38 - 126 U/L   Total Bilirubin 0.6 0.0 - 1.2 mg/dL   GFR, Estimated 35 (L) >60 mL/min    Comment: (NOTE) Calculated using the CKD-EPI Creatinine Equation (2021)  Anion gap 10 5 - 15    Comment: Performed at [facility], [address] [place], [place] [address]  Prepare RBC (crossmatch)     Status: None   Collection Time: 09/11/23  5:30 PM  Result Value Ref Range   Order Confirmation      ORDER PROCESSED BY BLOOD BANK Performed at [facility], [address], [place], [place] [address]   Protime-INR     Status: Abnormal   Collection Time: 09/11/23  8:24 PM  Result Value Ref Range    Prothrombin Time 19.6 (H) 11.4 - 15.2 seconds   INR 1.6 (H) 0.8 - 1.2    Comment: (NOTE) INR goal varies based on device and disease states. Performed at [facility], [address] [place], [place] [address]   Hemoglobin     Status: Abnormal   Collection Time: 09/11/23  8:43 PM  Result Value Ref Range   Hemoglobin 12.1 (L) 13.0 - 17.0 g/dL    Comment: REPEATED TO VERIFY POST TRANSFUSION SPECIMEN Performed at [facility], [address], [place], [place] [address]   Vitamin B12     Status: None   Collection Time: 09/11/23  8:43 PM  Result Value Ref Range   Vitamin B-12 407 180 - 914 pg/mL    Comment: (NOTE) This assay is not validated for testing neonatal or myeloproliferative syndrome specimens for Vitamin B12 levels. Performed at [facility], [address], [place], [place] [address]   Iron and TIBC     Status: Abnormal   Collection Time: 09/11/23  8:43 PM  Result Value Ref Range   Iron 54 45 - 182 ug/dL   TIBC 564 (L) 332 - 951 ug/dL   Saturation Ratios 27 17.9 - 39.5 %   UIBC 145 ug/dL    Comment: Performed at [facility], [address], [place], [place] [address]  Ferritin     Status: None   Collection Time: 09/11/23  8:43 PM  Result Value Ref Range   Ferritin 136 24 - 336 ng/mL    Comment: Performed at [facility], [address] [place], [place] [address]  Hemoglobin     Status: Abnormal   Collection Time: 09/12/23  1:57 AM  Result Value Ref Range   Hemoglobin 12.6 (L) 13.0 - 17.0 g/dL    Comment: Performed at [facility], [address], [place], [place] [address]  MRSA Next Gen by PCR, Nasal     Status: None   Collection Time: 09/12/23  5:51 AM   Specimen: Nasal Mucosa; Nasal Swab  Result Value Ref Range   MRSA by PCR Next Gen NOT DETECTED NOT DETECTED    Comment: (NOTE) The GeneXpert MRSA Assay (FDA approved for NASAL specimens  only), is one component of a comprehensive MRSA colonization surveillance program. It is not intended to diagnose MRSA infection nor to guide or monitor treatment for MRSA infections. Test performance is not FDA approved in patients less than 11 years old. Performed at [facility], [address] [place], [place] [address]     CT ABDOMEN PELVIS W CONTRAST Result Date: 09/11/2023 CLINICAL DATA:  Abdominal pain, acute, nonlocalized c/o rectal bleeding r/t colon cancer. No current treatment plan. Been ongoing for awhile and has medical appointment end of month. Rectal pain that feels like a softball sitting on top. Blood thinner noted. History colon cancer. EXAM: CT ABDOMEN AND PELVIS  WITH CONTRAST TECHNIQUE: Multidetector CT imaging of the abdomen and pelvis was performed using the standard protocol following bolus administration of intravenous contrast. RADIATION DOSE REDUCTION: This exam was performed according to the departmental dose-optimization program which includes automated exposure control, adjustment of the mA and/or kV according to patient size and/or use of iterative reconstruction technique. CONTRAST:  80mL OMNIPAQUE  IOHEXOL  300 MG/ML  SOLN COMPARISON:  CT abdomen pelvis 06/22/2023 FINDINGS: Lower chest: Persistent trace left pleural effusion with question partially visualized 2.5 by 0.8 cm subpleural nodularity. Coronary artery calcification. Hepatobiliary: No focal liver abnormality. No gallstones, gallbladder wall thickening, or pericholecystic fluid. No biliary dilatation. Pancreas: No focal lesion. Normal pancreatic contour. No surrounding inflammatory changes. No main pancreatic ductal dilatation. Spleen: Normal in size without focal abnormality. Adrenals/Urinary Tract: No adrenal nodule bilaterally. Bilateral kidneys enhance symmetrically. No nephroureterolithiasis bilaterally. No hydronephrosis. No hydroureter. Left parapelvic fluid density lesion likely represents  a simple renal cyst. Simple renal cysts, in the absence of clinically indicated signs/symptoms, require no independent follow-up. The urinary bladder is unremarkable. On delayed imaging, there is no urothelial wall thickening and there are no filling defects in the opacified portions of the bilateral collecting systems or ureters. Stomach/Bowel: Partial colectomy. Stomach is within normal limits. No evidence of small bowel wall thickening or dilatation. Interval development of mild mid sigmoid colon bowel wall thickening with trace pericolonic and perirectal fat stranding. Redemonstration of second portion of the duodenum diverticula. Colonic diverticulosis. Appendix appears normal. Vascular/Lymphatic: No abdominal aorta or iliac aneurysm. Moderate atherosclerotic plaque of the aorta and its branches. No abdominal, pelvic, or inguinal lymphadenopathy. Reproductive: Prostate is unremarkable. Other: No intraperitoneal free fluid. No intraperitoneal free gas. No organized fluid collection. Musculoskeletal: No abdominal wall hernia or abnormality. No suspicious lytic or blastic osseous lesions. No acute displaced fracture. Multilevel degenerative changes of the spine. IMPRESSION: 1. Persistent trace left pleural effusion with question partially visualized 2.5 by 0.8 cm subpleural nodularity. Recommend CT chest with intravenous (not CT PA) contrast for further evaluation. 2. Colonic diverticulosis with question developing or mild acute diverticulitis of the sigmoid colon. Recommend colonoscopy status post treatment and status post complete resolution of inflammatory changes to exclude an underlying lesion. 3. Partial colectomy. 4. Aortic Atherosclerosis (ICD10-I70.0) including coronary calcification. Electronically Signed   By: Morgane  Naveau M.D.   On: 09/11/2023 17:34   Review of Systems  Constitutional:  Positive for fatigue. Negative for activity change, appetite change, chills, diaphoresis and fever.  HENT:  Negative.    Eyes: Negative.   Respiratory: Negative.    Cardiovascular: Negative.   Gastrointestinal:  Positive for anal bleeding and blood in stool. Negative for abdominal distention, abdominal pain, constipation, diarrhea and nausea.  Endocrine: Negative.   Genitourinary: Negative.   Musculoskeletal:  Positive for arthralgias.  Skin: Negative.   Allergic/Immunologic: Negative.   Neurological:  Positive for weakness.  Hematological: Negative.   Psychiatric/Behavioral: Negative.     Blood pressure (!) 171/71, pulse 73, temperature 97.7 F (36.5 C), temperature source Oral, resp. rate 16, height 5' 9 (1.753 m), weight 108.2 kg, SpO2 100%. Physical Exam Constitutional:      General: He is not in acute distress.    Appearance: He is ill-appearing.  HENT:     Head: Normocephalic and atraumatic.     Mouth/Throat:     Mouth: Mucous membranes are moist.   Eyes:     Extraocular Movements: Extraocular movements intact.     Pupils: Pupils are equal, round, and reactive to light.  Cardiovascular:     Rate and Rhythm: Normal rate and regular rhythm.     Pulses: Normal pulses.  Pulmonary:     Effort: Pulmonary effort is normal.     Breath sounds: Normal breath sounds.  Abdominal:     General: There is no distension.     Tenderness: There is no abdominal tenderness. There is no guarding.   Musculoskeletal:     Cervical back: Normal range of motion.   Skin:    General: Skin is warm and dry.   Neurological:     General: No focal deficit present.     Mental Status: He is alert.   Assessment/Plan: 1) Posthemorrhagic anemia with rectal bleeding with evidence of diverticulitis on CT scan.  With recommend treatment with antibiotics and wait for his inflammation to resolve before follow-up colonoscopy can be done. I am not sure if he will desires to have any more workup done from a GI standpoint.  I suspect he has a diverticular bleed. 2) History of Stage IIIb colon cancer status  post right hemicolectomy in September 2022. 3) Chronic kidney disease stage IIIa. 4) Hypertension/hyperlipidemia. 5) History of atrial fibrillation on Eliquis  at home. 6) Peripheral vascular disease. 7) DNR.  Tami Falcon 09/12/2023, 7:25 AM

## 2023-09-12 NOTE — Progress Notes (Signed)
 PROGRESS NOTE    Bradley Stokes  ZOX:[id number] DOB: 1945-09-17 DOA: 09/11/2023 PCP: Annamarie Kid, MD   Brief Narrative: Patient lives in [facility].  He was brought in with complaints of rectal bleeding intermittently for the past week.  He denied nausea vomiting diarrhea constipation chest pain shortness of breath fever chills cough abdominal pain. He has a history of colon cancer, partial colectomy, chronic atrial fibrillation on anticoagulation with Eliquis .  His hemoglobin on admission was found to be 5.4 down from 12.4 approximately 6 months ago.  He had active rectal bleeding in the ED.  Patient received 2 units of packed RBC.  In the ER Eliquis  reversal was done with Andexxa.  His other medical history includes stage III CKD a, hypertension, colon cancer and chronic atrial fibrillation. His hemoglobin surprisingly went up to 12.6 from 5.4 after just 2 units.  Repeat hemoglobin was done to make sure it was true. CT abdomen-persistent trace left pleural effusion partially visualized 2.5 x 0.8 cm subpleural nodularity recommend CT chest with contrast.  Colonic diverticulosis with question developing or mild acute diverticulitis of the sigmoid colon.  Assessment & Plan:   Principal Problem:   Acute gastrointestinal bleeding  #1 acute GI bleed question diverticular bleed patient was admitted with complaints of rectal bleeding for the past week.  His hemoglobin on admission was 5.4.  He got 2 units of blood transfusion and went up to 12.6.  He remains hemodynamically stable.  He was on Eliquis  for A-fib prior to admission which is on hold.  GI was consulted and patient is NPO.  Continue Protonix  for now.  CT abdomen concern for acute diverticulitis in the sigmoid colon.  He denies abdominal pain nausea vomiting.  He does not have a white count.  #2 abnormal CT findings of 2.5 x 0.8 cm subpleural nodularity seen on CT abdomen and recommending CT chest with contrast.  Will  hold off on this for now with CKD.  Will discuss with patient to see if he is interested in proceeding with a CT.   #3 CKD stage IIIa creatinine 1.8 from 1.92 Improved from prior values.  Will keep close monitor.  Daily labs.  Avoid nephrotoxins hypotension.  #4 history of colon cancer and right colectomy followed by oncology as outpatient?  Nodule in the lung metastatic  #5 hyperlipidemia continue statin  #6 hypertension 147/64 restart home medications  #7 chronic atrial fibrillation Eliquis  on hold continue amiodarone   #8 peripheral vascular disease/chronic lower extremity venous stasis changes present on admission   Estimated body mass index is 35.23 kg/m as calculated from the following:   Height as of this encounter: 5' 9 (1.753 m).   Weight as of this encounter: 108.2 kg.  DVT prophylaxis: none Code Status: DNR Family Communication: None none Disposition Plan:  Status is: Inpatient Remains inpatient appropriate because: Acute illness   Consultants:  GI I  Procedures: None Antimicrobials: None   Subjective: Patient is resting in bed he denies nausea vomiting abdominal pain has not had any further bowel movements  Objective: Vitals:   09/12/23 0336 09/12/23 0646 09/12/23 0746 09/12/23 0833  BP: (!) 171/71  (!) 149/59 (!) 147/64  Pulse: 73  67 66  Resp: 16  20 18   Temp: 97.7 F (36.5 C)  97.7 F (36.5 C) 98 F (36.7 C)  TempSrc: Oral   Oral  SpO2: 100%  99%   Weight:  108.2 kg    Height:  5' 9 (1.753  m)      Intake/Output Summary (Last 24 hours) at 09/12/2023 0930 Last data filed at 09/11/2023 1632 Gross per 24 hour  Intake 250 ml  Output --  Net 250 ml   Filed Weights   09/11/23 2223 09/12/23 0646  Weight: 111.6 kg 108.2 kg    Examination:  General exam: Appears chronically ill-appearing in no acute distress Respiratory system: Clear to auscultation. Respiratory effort normal. Cardiovascular system: S1 & S2 heard, RRR. No JVD, murmurs, rubs,  gallops or clicks Gastrointestinal system: Abdomen is nondistended, soft and nontender. No organomegaly or masses felt. Normal bowel sounds heard. Central nervous system: Alert and oriented. No focal neurological deficits. Extremities: Chronic venous stasis changes  Data Reviewed: I have personally reviewed following labs and imaging studies  CBC: Recent Labs  Lab 09/11/23 1557 09/11/23 2043 09/12/23 0157 09/12/23 0822  WBC 6.7  --   --  9.4  HGB 5.4* 12.1* 12.6* 13.1  HCT 19.9*  --   --  42.4  MCV 115.0*  --   --  99.8  PLT 161  --   --  291   Basic Metabolic Panel: Recent Labs  Lab 09/11/23 1557  NA 141  K 4.3  CL 103  CO2 28  GLUCOSE 92  BUN 25*  CREATININE 1.92*  CALCIUM  8.7*   GFR: Estimated Creatinine Clearance: 37.8 mL/min (A) (by C-G formula based on SCr of 1.92 mg/dL (H)). Liver Function Tests: Recent Labs  Lab 09/11/23 1557  AST 12*  ALT 5  ALKPHOS 95  BILITOT 0.6  PROT 6.5  ALBUMIN 2.4*   No results for input(s): LIPASE, AMYLASE in the last 168 hours. No results for input(s): AMMONIA in the last 168 hours. Coagulation Profile: Recent Labs  Lab 09/11/23 2024  INR 1.6*   Cardiac Enzymes: No results for input(s): CKTOTAL, CKMB, CKMBINDEX, TROPONINI in the last 168 hours. BNP (last 3 results) No results for input(s): PROBNP in the last 8760 hours. HbA1C: No results for input(s): HGBA1C in the last 72 hours. CBG: No results for input(s): GLUCAP in the last 168 hours. Lipid Profile: No results for input(s): CHOL, HDL, LDLCALC, TRIG, CHOLHDL, LDLDIRECT in the last 72 hours. Thyroid  Function Tests: No results for input(s): TSH, T4TOTAL, FREET4, T3FREE, THYROIDAB in the last 72 hours. Anemia Panel: Recent Labs    09/11/23 2043  VITAMINB12 407  FERRITIN 136  TIBC 199*  IRON 54   Sepsis Labs: No results for input(s): PROCALCITON, LATICACIDVEN in the last 168 hours.  Recent Results (from the  past 240 hours)  MRSA Next Gen by PCR, Nasal     Status: None   Collection Time: 09/12/23  5:51 AM   Specimen: Nasal Mucosa; Nasal Swab  Result Value Ref Range Status   MRSA by PCR Next Gen NOT DETECTED NOT DETECTED Final    Comment: (NOTE) The GeneXpert MRSA Assay (FDA approved for NASAL specimens only), is one component of a comprehensive MRSA colonization surveillance program. It is not intended to diagnose MRSA infection nor to guide or monitor treatment for MRSA infections. Test performance is not FDA approved in patients less than 74 years old. Performed at [facility], [address], [place], [place] [address]          Radiology Studies: CT ABDOMEN PELVIS W CONTRAST Result Date: 09/11/2023 CLINICAL DATA:  Abdominal pain, acute, nonlocalized c/o rectal bleeding r/t colon cancer. No current treatment plan. Been ongoing for awhile and has medical appointment end of month.  Rectal pain that feels like a softball sitting on top. Blood thinner noted. History colon cancer. EXAM: CT ABDOMEN AND PELVIS WITH CONTRAST TECHNIQUE: Multidetector CT imaging of the abdomen and pelvis was performed using the standard protocol following bolus administration of intravenous contrast. RADIATION DOSE REDUCTION: This exam was performed according to the departmental dose-optimization program which includes automated exposure control, adjustment of the mA and/or kV according to patient size and/or use of iterative reconstruction technique. CONTRAST:  80mL OMNIPAQUE  IOHEXOL  300 MG/ML  SOLN COMPARISON:  CT abdomen pelvis 06/22/2023 FINDINGS: Lower chest: Persistent trace left pleural effusion with question partially visualized 2.5 by 0.8 cm subpleural nodularity. Coronary artery calcification. Hepatobiliary: No focal liver abnormality. No gallstones, gallbladder wall thickening, or pericholecystic fluid. No biliary dilatation. Pancreas: No focal lesion. Normal pancreatic contour. No  surrounding inflammatory changes. No main pancreatic ductal dilatation. Spleen: Normal in size without focal abnormality. Adrenals/Urinary Tract: No adrenal nodule bilaterally. Bilateral kidneys enhance symmetrically. No nephroureterolithiasis bilaterally. No hydronephrosis. No hydroureter. Left parapelvic fluid density lesion likely represents a simple renal cyst. Simple renal cysts, in the absence of clinically indicated signs/symptoms, require no independent follow-up. The urinary bladder is unremarkable. On delayed imaging, there is no urothelial wall thickening and there are no filling defects in the opacified portions of the bilateral collecting systems or ureters. Stomach/Bowel: Partial colectomy. Stomach is within normal limits. No evidence of small bowel wall thickening or dilatation. Interval development of mild mid sigmoid colon bowel wall thickening with trace pericolonic and perirectal fat stranding. Redemonstration of second portion of the duodenum diverticula. Colonic diverticulosis. Appendix appears normal. Vascular/Lymphatic: No abdominal aorta or iliac aneurysm. Moderate atherosclerotic plaque of the aorta and its branches. No abdominal, pelvic, or inguinal lymphadenopathy. Reproductive: Prostate is unremarkable. Other: No intraperitoneal free fluid. No intraperitoneal free gas. No organized fluid collection. Musculoskeletal: No abdominal wall hernia or abnormality. No suspicious lytic or blastic osseous lesions. No acute displaced fracture. Multilevel degenerative changes of the spine. IMPRESSION: 1. Persistent trace left pleural effusion with question partially visualized 2.5 by 0.8 cm subpleural nodularity. Recommend CT chest with intravenous (not CT PA) contrast for further evaluation. 2. Colonic diverticulosis with question developing or mild acute diverticulitis of the sigmoid colon. Recommend colonoscopy status post treatment and status post complete resolution of inflammatory changes to  exclude an underlying lesion. 3. Partial colectomy. 4. Aortic Atherosclerosis (ICD10-I70.0) including coronary calcification. Electronically Signed   By: Morgane  Naveau M.D.   On: 09/11/2023 17:34     Scheduled Meds:  acetaminophen   1,000 mg Oral Q12H   allopurinol   100 mg Oral QHS   amiodarone   100 mg Oral Daily   atorvastatin   20 mg Oral QHS   pantoprazole   40 mg Oral BID AC   Continuous Infusions:  sodium chloride        LOS: 1 day    Time spent: 50 min  Barbee Lew, MD  09/12/2023, 9:30 AM

## 2023-09-12 NOTE — TOC Initial Note (Signed)
 Transition of Care [facility]) - Initial/Assessment Note    Patient Details  Name: Bradley Stokes MRN: [id number] Date of Birth: Oct 15, 1945  [facility]) CM/SW Contact:    Levie Ream, RN Phone Number: 09/12/2023, 3:09 PM  Clinical Narrative:                 TOC for d/c planning; pt says he is from [place]; he plans to return at d/c w/ transport by [facility]; he identified POC friend Floria Hurst [phone]); pt verified insurance/PCP; he denied SDOH risks; pt says he has a wheelchair; he does not receive HH services, or home oxygen; pt says he is able to feed himself; he wears glasses; pt says he is incontinent of bowel and bladder; spoke w/ Johnnette Nakayama in admissions at facility; she says pt can return; she also says FL2, and ins auth needed; TOC is following.  Expected Discharge Plan: Skilled Nursing Facility Barriers to Discharge: Continued Medical Work up   Patient Goals and CMS Choice Patient states their goals for this hospitalization and ongoing recovery are:: return to [facility].gov Compare Post Acute Care list provided to:: Patient   [place] ownership interest in [facility].provided to:: Patient    Expected Discharge Plan and Services   Discharge Planning Services: CM Consult   Living arrangements for the past 2 months: Skilled Nursing Facility                                      Prior Living Arrangements/Services Living arrangements for the past 2 months: Skilled Nursing Facility Lives with:: Facility Resident Patient language and need for interpreter reviewed:: Yes Do you feel safe going back to the place where you live?: Yes      Need for Family Participation in Patient Care: Yes (Comment) Care giver support system in place?: Yes (comment) Current home services: DME (wheelchair) Criminal Activity/Legal Involvement Pertinent to Current Situation/Hospitalization: No - Comment as needed  Activities of Daily  Living   ADL Screening (condition at time of admission) Independently performs ADLs?: No Does the patient have a NEW difficulty with bathing/dressing/toileting/self-feeding that is expected to last >3 days?: No Does the patient have a NEW difficulty with getting in/out of bed, walking, or climbing stairs that is expected to last >3 days?: No Does the patient have a NEW difficulty with communication that is expected to last >3 days?: No Is the patient deaf or have difficulty hearing?: Yes Does the patient have difficulty seeing, even when wearing glasses/contacts?: Yes Does the patient have difficulty concentrating, remembering, or making decisions?: Yes  Permission Sought/Granted Permission sought to share information with : Case Manager Permission granted to share information with : Yes, Verbal Permission Granted  Share Information with NAME: Case Manager     Permission granted to share info w Relationship: Floria Hurst (friend) [phone]     Emotional Assessment Appearance:: Appears stated age Attitude/Demeanor/Rapport: Gracious Affect (typically observed): Accepting Orientation: : Oriented to Self, Oriented to Place, Oriented to  Time, Oriented to Situation Alcohol / Substance Use: Not Applicable Psych Involvement: No (comment)  Admission diagnosis:  Rectal bleeding [K62.5] Acute gastrointestinal bleeding [K92.2] Chronic anticoagulation [Z79.01] Patient Active Problem List   Diagnosis Date Noted   Acute gastrointestinal bleeding 09/11/2023   Anemia    Cancer of left colon (HCC)    GI bleed 12/02/2020   (HFpEF) heart failure with preserved ejection fraction (  HCC) 12/02/2020   Acute blood loss anemia 12/02/2020   Heart murmur previously undiagnosed 01/01/2015   Long term use of drug 09/05/2011   PAF (paroxysmal atrial fibrillation) (HCC)    Edema    Long term current use of anticoagulant therapy    Hyperlipidemia    Hypertension    PCP:  Annamarie Kid,  MD Pharmacy:   [facility] - [place], [place] - [address] [place] [address] Phone: [phone] Fax: [phone]     Social Drivers of Health (SDOH) Social History: SDOH Screenings   Food Insecurity: No Food Insecurity (09/12/2023)  Housing: Low Risk  (09/12/2023)  Transportation Needs: No Transportation Needs (09/12/2023)  Utilities: Not At Risk (09/12/2023)  Social Connections: Unknown (09/12/2023)  Tobacco Use: Low Risk  (09/11/2023)   SDOH Interventions: Food Insecurity Interventions: Intervention Not Indicated, Inpatient TOC Housing Interventions: Intervention Not Indicated, Inpatient TOC Transportation Interventions: Intervention Not Indicated, Inpatient TOC Utilities Interventions: Intervention Not Indicated, Inpatient TOC   Readmission Risk Interventions     No data to display

## 2023-09-13 DIAGNOSIS — K922 Gastrointestinal hemorrhage, unspecified: Secondary | ICD-10-CM | POA: Diagnosis not present

## 2023-09-13 LAB — CBC
HCT: 42.8 % (ref 39.0–52.0)
Hemoglobin: 13.1 g/dL (ref 13.0–17.0)
MCH: 31.3 pg (ref 26.0–34.0)
MCHC: 30.6 g/dL (ref 30.0–36.0)
MCV: 102.1 fL — ABNORMAL HIGH (ref 80.0–100.0)
Platelets: 275 10*3/uL (ref 150–400)
RBC: 4.19 MIL/uL — ABNORMAL LOW (ref 4.22–5.81)
RDW: 15.7 % — ABNORMAL HIGH (ref 11.5–15.5)
WBC: 11.6 10*3/uL — ABNORMAL HIGH (ref 4.0–10.5)
nRBC: 0 % (ref 0.0–0.2)

## 2023-09-13 MED ORDER — AMMONIUM LACTATE 12 % EX LOTN
TOPICAL_LOTION | Freq: Every day | CUTANEOUS | Status: DC
  Filled 2023-09-13: qty 225

## 2023-09-13 NOTE — Plan of Care (Signed)

## 2023-09-13 NOTE — Progress Notes (Signed)
 Subjective: Patient claims he is feeling better today and has not had any further rectal bleeding his abdominal discomfort has resolved.  He denies have any nausea or vomiting.  He is tolerating clears well.  He is aware that he is due for a follow-up colonoscopy which was supposed to be done a year after his initial diagnosis but he did not follow up on that.    Objective: Vital signs in last 24 hours: Temp:  [97.6 F (36.4 C)-98.4 F (36.9 C)] 98.3 F (36.8 C) (06/15 0623) Pulse Rate:  [42-74] 42 (06/15 0623) Resp:  [16-20] 16 (06/15 0623) BP: (114-135)/(51-56) 114/56 (06/15 0623) SpO2:  [91 %-94 %] 91 % (06/15 0623) Last BM Date : 09/11/23  Intake/Output from previous day: 06/14 0701 - 06/15 0700 In: 833.8 [P.O.:240; I.V.:543.8; IV Piggyback:50] Out: 400 [Urine:400] Intake/Output this shift: No intake/output data recorded.  General appearance: alert, cooperative, appears stated age, fatigued, no distress, and moderately obese Resp: clear to auscultation bilaterally Cardio: regular rate and rhythm, S1, S2 normal, no murmur, click, rub or gallop GI: soft, non-tender; bowel sounds normal; no masses,  no organomegaly  Lab Results: Recent Labs    09/11/23 1557 09/11/23 2043 09/12/23 0822 09/12/23 1427 09/13/23 0528  WBC 6.7  --  9.4  --  11.6*  HGB 5.4*   < > 13.1 13.5 13.1  HCT 19.9*  --  42.4  --  42.8  PLT 161  --  291  --  275   < > = values in this interval not displayed.   BMET Recent Labs    09/11/23 1557 09/12/23 0822  NA 141 140  K 4.3 3.9  CL 103 102  CO2 28 27  GLUCOSE 92 76  BUN 25* 24*  CREATININE 1.92* 1.83*  CALCIUM  8.7* 8.8*   LFT Recent Labs    09/12/23 0822  PROT 6.6  ALBUMIN 2.5*  AST 9*  ALT 7  ALKPHOS 96  BILITOT 1.1   PT/INR Recent Labs    09/11/23 2024  LABPROT 19.6*  INR 1.6*   Hepatitis Panel No results for input(s): HEPBSAG, HCVAB, HEPAIGM, HEPBIGM in the last 72 hours. C-Diff No results for input(s):  CDIFFTOX in the last 72 hours. No results for input(s): CDIFFPCR in the last 72 hours. Fecal Lactopherrin No results for input(s): FECLLACTOFRN in the last 72 hours.  Studies/Results: CT ABDOMEN PELVIS W CONTRAST Result Date: 09/11/2023 CLINICAL DATA:  Abdominal pain, acute, nonlocalized c/o rectal bleeding r/t colon cancer. No current treatment plan. Been ongoing for awhile and has medical appointment end of month. Rectal pain that feels like a softball sitting on top. Blood thinner noted. History colon cancer. EXAM: CT ABDOMEN AND PELVIS WITH CONTRAST TECHNIQUE: Multidetector CT imaging of the abdomen and pelvis was performed using the standard protocol following bolus administration of intravenous contrast. RADIATION DOSE REDUCTION: This exam was performed according to the departmental dose-optimization program which includes automated exposure control, adjustment of the mA and/or kV according to patient size and/or use of iterative reconstruction technique. CONTRAST:  80mL OMNIPAQUE  IOHEXOL  300 MG/ML  SOLN COMPARISON:  CT abdomen pelvis 06/22/2023 FINDINGS: Lower chest: Persistent trace left pleural effusion with question partially visualized 2.5 by 0.8 cm subpleural nodularity. Coronary artery calcification. Hepatobiliary: No focal liver abnormality. No gallstones, gallbladder wall thickening, or pericholecystic fluid. No biliary dilatation. Pancreas: No focal lesion. Normal pancreatic contour. No surrounding inflammatory changes. No main pancreatic ductal dilatation. Spleen: Normal in size without focal abnormality. Adrenals/Urinary Tract: No adrenal  nodule bilaterally. Bilateral kidneys enhance symmetrically. No nephroureterolithiasis bilaterally. No hydronephrosis. No hydroureter. Left parapelvic fluid density lesion likely represents a simple renal cyst. Simple renal cysts, in the absence of clinically indicated signs/symptoms, require no independent follow-up. The urinary bladder is  unremarkable. On delayed imaging, there is no urothelial wall thickening and there are no filling defects in the opacified portions of the bilateral collecting systems or ureters. Stomach/Bowel: Partial colectomy. Stomach is within normal limits. No evidence of small bowel wall thickening or dilatation. Interval development of mild mid sigmoid colon bowel wall thickening with trace pericolonic and perirectal fat stranding. Redemonstration of second portion of the duodenum diverticula. Colonic diverticulosis. Appendix appears normal. Vascular/Lymphatic: No abdominal aorta or iliac aneurysm. Moderate atherosclerotic plaque of the aorta and its branches. No abdominal, pelvic, or inguinal lymphadenopathy. Reproductive: Prostate is unremarkable. Other: No intraperitoneal free fluid. No intraperitoneal free gas. No organized fluid collection. Musculoskeletal: No abdominal wall hernia or abnormality. No suspicious lytic or blastic osseous lesions. No acute displaced fracture. Multilevel degenerative changes of the spine. IMPRESSION: 1. Persistent trace left pleural effusion with question partially visualized 2.5 by 0.8 cm subpleural nodularity. Recommend CT chest with intravenous (not CT PA) contrast for further evaluation. 2. Colonic diverticulosis with question developing or mild acute diverticulitis of the sigmoid colon. Recommend colonoscopy status post treatment and status post complete resolution of inflammatory changes to exclude an underlying lesion. 3. Partial colectomy. 4. Aortic Atherosclerosis (ICD10-I70.0) including coronary calcification. Electronically Signed   By: Morgane  Naveau M.D.   On: 09/11/2023 17:34    Medications: I have reviewed the patient's current medications. Prior to Admission:  Medications Prior to Admission  Medication Sig Dispense Refill Last Dose/Taking   acetaminophen  (TYLENOL ) 500 MG tablet Take 1,000 mg by mouth See admin instructions. Take 1,000 mg by mouth twice a day and an  additional 1,000 mg every eight hours as needed for pain   Unknown   allopurinol  (ZYLOPRIM ) 100 MG tablet Take 100 mg by mouth at bedtime.   Unknown   Amino Acids-Protein Hydrolys (PRO-STAT) LIQD Take 30 mLs by mouth daily.   Unknown   amiodarone  (PACERONE ) 100 MG tablet Take 100 mg by mouth daily.   Unknown   ammonium lactate  (LAC-HYDRIN ) 12 % lotion Apply 1 application  topically See admin instructions. Apply to bilateral lower extremities and tops of the feet once a day (after cleansing/rinsing/drying)   Unknown   apixaban  (ELIQUIS ) 5 MG TABS tablet Take 1 tablet (5 mg total) by mouth 2 (two) times daily. 60 tablet  Unknown   atorvastatin  (LIPITOR) 20 MG tablet Take 20 mg by mouth at bedtime.   Unknown   celecoxib (CELEBREX) 100 MG capsule Take 100 mg by mouth daily.   Unknown   cholecalciferol (VITAMIN D3) 25 MCG (1000 UNIT) tablet Take 2,000 Units by mouth daily.   Unknown   Cyanocobalamin  (VITAMIN B-12) 5000 MCG SUBL Place 5,000 mcg under the tongue daily.   Unknown   furosemide  (LASIX ) 20 MG tablet Take 20 mg by mouth every other day.   Unknown   K Phos  Mono-Sod Phos Di & Mono (PHOSPHA 250 NEUTRAL) 155-852-130 MG TABS Take 1 tablet by mouth at bedtime.   Unknown   loratadine (CLARITIN) 10 MG tablet Take 10 mg by mouth daily.   Unknown   mineral oil-hydrophilic petrolatum (AQUAPHOR) ointment Apply 1 Application topically See admin instructions. Apply to bilateral lower extremities and tops of the feet once a day (after cleansing/rinsing/drying)   Unknown  ondansetron  (ZOFRAN ) 4 MG tablet Take 4 mg by mouth every 6 (six) hours as needed for nausea or vomiting.   Unknown   pantoprazole  (PROTONIX ) 40 MG tablet Take 1 tablet (40 mg total) by mouth daily. (Patient taking differently: Take 40 mg by mouth 2 (two) times daily before a meal.)   Unknown   sennosides-docusate sodium  (SENOKOT-S) 8.6-50 MG tablet Take 1 tablet by mouth See admin instructions. Take 1 tablet by mouth at bedtime- hold if  having loose stools   Unknown   Nutritional Supplements (,FEEDING SUPPLEMENT, PROSOURCE PLUS) liquid Take 30 mLs by mouth 3 (three) times daily between meals. (Patient not taking: Reported on 09/11/2023)   Not Taking   potassium chloride  SA (KLOR-CON ) 20 MEQ tablet TAKE 1 TABLET BY MOUTH DAILY (Patient not taking: Reported on 09/11/2023) 90 tablet 3 Not Taking   Scheduled:  acetaminophen   1,000 mg Oral Q12H   allopurinol   100 mg Oral QHS   amiodarone   100 mg Oral Daily   atorvastatin   20 mg Oral QHS   pantoprazole   40 mg Oral BID AC   Continuous:  piperacillin-tazobactam (ZOSYN)  IV 3.375 g (09/13/23 0354)    Assessment/Plan: 1) Mild acute diverticulitis on CT scan.  Patient is aware he needs to finish her course of antibiotics before any invasive procedures can be done but on detailed questioning he does not want to have another colonoscopy. I suspect he had a diverticular bleed. We will advance his diet to low residue soft diet 2) History of Stage IIIb colon cancer status post right hemicolectomy in September 2022. 3) Chronic kidney disease stage IIIa. 4) Hypertension/hyperlipidemia. 5) History of atrial fibrillation on Eliquis  at home. 6) Peripheral vascular disease. 7) DNR. Will sign off now please call if further assistance is needed.  LOS: 2 days   Tami Falcon 09/13/2023, 10:21 AM

## 2023-09-13 NOTE — Progress Notes (Signed)
 PROGRESS NOTE    Bradley Stokes  JYN:[id number] DOB: 06-02-1945 DOA: 09/11/2023 PCP: Annamarie Kid, MD   Brief Narrative: Patient lives in [facility].  He was brought in with complaints of rectal bleeding intermittently for the past week.  He denied nausea vomiting diarrhea constipation chest pain shortness of breath fever chills cough abdominal pain. He has a history of colon cancer, partial colectomy, chronic atrial fibrillation on anticoagulation with Eliquis .  His hemoglobin on admission was found to be 5.4 down from 12.4 approximately 6 months ago.  He had active rectal bleeding in the ED.  Patient received 2 units of packed RBC.  In the ER Eliquis  reversal was done with Andexxa.  His other medical history includes stage III CKD a, hypertension, colon cancer and chronic atrial fibrillation. His hemoglobin surprisingly went up to 12.6 from 5.4 after just 2 units.  Repeat hemoglobin was done to make sure it was true. CT abdomen-persistent trace left pleural effusion partially visualized 2.5 x 0.8 cm subpleural nodularity recommend CT chest with contrast.  Colonic diverticulosis with question developing or mild acute diverticulitis of the sigmoid colon.  Assessment & Plan:   Principal Problem:   Acute gastrointestinal bleeding  #1 acute GI bleed question diverticular bleed patient was admitted with complaints of rectal bleeding for the past week.  His hemoglobin on admission was 5.4.  He got 2 units of blood transfusion and went up to 12.6.  He remains hemodynamically stable.  He was on Eliquis  for A-fib prior to admission which is on hold.  His hemoglobin remained stable at 13.1.  No further bowel movements or nausea or vomiting since admission.  Patient was started on Zosyn for acute diverticulitis with improvement.  GI consulted and appreciated.  He is aware he was supposed to have a colonoscopy a year ago but did not get it done. Will advance diet today and see how he  does.  #2 abnormal CT findings of 2.5 x 0.8 cm subpleural nodularity seen on CT abdomen and recommending CT chest with contrast.  Will hold off on this for now with CKD.  Will discuss with patient to see if he is interested in proceeding with a CT.   #3 CKD stage IIIa creatinine 1.8 from 1.92 Improved from prior values.  Will keep close monitor.  Daily labs.  Avoid nephrotoxins hypotension.  #4 history of colon cancer and right colectomy followed by oncology as outpatient?  Nodule in the lung metastatic  #5 hyperlipidemia continue statin  #6 hypertension 147/64 restart home medications  #7 chronic atrial fibrillation Eliquis  on hold continue amiodarone   #8 peripheral vascular disease/chronic lower extremity venous stasis changes present on admission   Estimated body mass index is 35.23 kg/m as calculated from the following:   Height as of this encounter: 5' 9 (1.753 m).   Weight as of this encounter: 108.2 kg.  DVT prophylaxis: none Code Status: DNR Family Communication: None none Disposition Plan:  Status is: Inpatient Remains inpatient appropriate because: Acute illness   Consultants:  GI I  Procedures: None Antimicrobials: None   Subjective: Denies nausea vomiting abdominal pain or having another bowel movement since admission. Objective: Vitals:   09/12/23 1230 09/12/23 2107 09/13/23 0623 09/13/23 1224  BP: (!) 135/52 (!) 124/51 (!) 114/56 (!) 90/57  Pulse: 70 74 (!) 42 77  Resp: 20 16 16 19   Temp: 97.6 F (36.4 C) 98.4 F (36.9 C) 98.3 F (36.8 C) (!) 97.3 F (36.3 C)  TempSrc:  Oral Oral Oral Oral  SpO2: 94% 94% 91% 95%  Weight:      Height:        Intake/Output Summary (Last 24 hours) at 09/13/2023 1335 Last data filed at 09/12/2023 1744 Gross per 24 hour  Intake 593.75 ml  Output --  Net 593.75 ml   Filed Weights   09/11/23 2223 09/12/23 0646  Weight: 111.6 kg 108.2 kg    Examination:  General exam: Appears chronically ill-appearing in no  acute distress Respiratory system: Clear to auscultation. Respiratory effort normal. Cardiovascular system: S1 & S2 heard, RRR. No JVD, murmurs, rubs, gallops or clicks Gastrointestinal system: Abdomen is nondistended, soft and nontender. No organomegaly or masses felt. Normal bowel sounds heard. Central nervous system: Alert and oriented. No focal neurological deficits. Extremities: Chronic venous stasis changes  Data Reviewed: I have personally reviewed following labs and imaging studies  CBC: Recent Labs  Lab 09/11/23 1557 09/11/23 2043 09/12/23 0157 09/12/23 0822 09/12/23 1427 09/13/23 0528  WBC 6.7  --   --  9.4  --  11.6*  HGB 5.4* 12.1* 12.6* 13.1 13.5 13.1  HCT 19.9*  --   --  42.4  --  42.8  MCV 115.0*  --   --  99.8  --  102.1*  PLT 161  --   --  291  --  275   Basic Metabolic Panel: Recent Labs  Lab 09/11/23 1557 09/12/23 0822  NA 141 140  K 4.3 3.9  CL 103 102  CO2 28 27  GLUCOSE 92 76  BUN 25* 24*  CREATININE 1.92* 1.83*  CALCIUM  8.7* 8.8*   GFR: Estimated Creatinine Clearance: 39.7 mL/min (A) (by C-G formula based on SCr of 1.83 mg/dL (H)). Liver Function Tests: Recent Labs  Lab 09/11/23 1557 09/12/23 0822  AST 12* 9*  ALT 5 7  ALKPHOS 95 96  BILITOT 0.6 1.1  PROT 6.5 6.6  ALBUMIN 2.4* 2.5*   No results for input(s): LIPASE, AMYLASE in the last 168 hours. No results for input(s): AMMONIA in the last 168 hours. Coagulation Profile: Recent Labs  Lab 09/11/23 2024  INR 1.6*   Cardiac Enzymes: No results for input(s): CKTOTAL, CKMB, CKMBINDEX, TROPONINI in the last 168 hours. BNP (last 3 results) No results for input(s): PROBNP in the last 8760 hours. HbA1C: No results for input(s): HGBA1C in the last 72 hours. CBG: No results for input(s): GLUCAP in the last 168 hours. Lipid Profile: No results for input(s): CHOL, HDL, LDLCALC, TRIG, CHOLHDL, LDLDIRECT in the last 72 hours. Thyroid  Function Tests: No  results for input(s): TSH, T4TOTAL, FREET4, T3FREE, THYROIDAB in the last 72 hours. Anemia Panel: Recent Labs    09/11/23 2043  VITAMINB12 407  FERRITIN 136  TIBC 199*  IRON 54   Sepsis Labs: No results for input(s): PROCALCITON, LATICACIDVEN in the last 168 hours.  Recent Results (from the past 240 hours)  MRSA Next Gen by PCR, Nasal     Status: None   Collection Time: 09/12/23  5:51 AM   Specimen: Nasal Mucosa; Nasal Swab  Result Value Ref Range Status   MRSA by PCR Next Gen NOT DETECTED NOT DETECTED Final    Comment: (NOTE) The GeneXpert MRSA Assay (FDA approved for NASAL specimens only), is one component of a comprehensive MRSA colonization surveillance program. It is not intended to diagnose MRSA infection nor to guide or monitor treatment for MRSA infections. Test performance is not FDA approved in patients less than 78 years old. Performed  at [facility], [address], [place], [place] [address]          Radiology Studies: CT ABDOMEN PELVIS W CONTRAST Result Date: 09/11/2023 CLINICAL DATA:  Abdominal pain, acute, nonlocalized c/o rectal bleeding r/t colon cancer. No current treatment plan. Been ongoing for awhile and has medical appointment end of month. Rectal pain that feels like a softball sitting on top. Blood thinner noted. History colon cancer. EXAM: CT ABDOMEN AND PELVIS WITH CONTRAST TECHNIQUE: Multidetector CT imaging of the abdomen and pelvis was performed using the standard protocol following bolus administration of intravenous contrast. RADIATION DOSE REDUCTION: This exam was performed according to the departmental dose-optimization program which includes automated exposure control, adjustment of the mA and/or kV according to patient size and/or use of iterative reconstruction technique. CONTRAST:  80mL OMNIPAQUE  IOHEXOL  300 MG/ML  SOLN COMPARISON:  CT abdomen pelvis 06/22/2023 FINDINGS: Lower chest: Persistent trace left  pleural effusion with question partially visualized 2.5 by 0.8 cm subpleural nodularity. Coronary artery calcification. Hepatobiliary: No focal liver abnormality. No gallstones, gallbladder wall thickening, or pericholecystic fluid. No biliary dilatation. Pancreas: No focal lesion. Normal pancreatic contour. No surrounding inflammatory changes. No main pancreatic ductal dilatation. Spleen: Normal in size without focal abnormality. Adrenals/Urinary Tract: No adrenal nodule bilaterally. Bilateral kidneys enhance symmetrically. No nephroureterolithiasis bilaterally. No hydronephrosis. No hydroureter. Left parapelvic fluid density lesion likely represents a simple renal cyst. Simple renal cysts, in the absence of clinically indicated signs/symptoms, require no independent follow-up. The urinary bladder is unremarkable. On delayed imaging, there is no urothelial wall thickening and there are no filling defects in the opacified portions of the bilateral collecting systems or ureters. Stomach/Bowel: Partial colectomy. Stomach is within normal limits. No evidence of small bowel wall thickening or dilatation. Interval development of mild mid sigmoid colon bowel wall thickening with trace pericolonic and perirectal fat stranding. Redemonstration of second portion of the duodenum diverticula. Colonic diverticulosis. Appendix appears normal. Vascular/Lymphatic: No abdominal aorta or iliac aneurysm. Moderate atherosclerotic plaque of the aorta and its branches. No abdominal, pelvic, or inguinal lymphadenopathy. Reproductive: Prostate is unremarkable. Other: No intraperitoneal free fluid. No intraperitoneal free gas. No organized fluid collection. Musculoskeletal: No abdominal wall hernia or abnormality. No suspicious lytic or blastic osseous lesions. No acute displaced fracture. Multilevel degenerative changes of the spine. IMPRESSION: 1. Persistent trace left pleural effusion with question partially visualized 2.5 by 0.8 cm  subpleural nodularity. Recommend CT chest with intravenous (not CT PA) contrast for further evaluation. 2. Colonic diverticulosis with question developing or mild acute diverticulitis of the sigmoid colon. Recommend colonoscopy status post treatment and status post complete resolution of inflammatory changes to exclude an underlying lesion. 3. Partial colectomy. 4. Aortic Atherosclerosis (ICD10-I70.0) including coronary calcification. Electronically Signed   By: Morgane  Naveau M.D.   On: 09/11/2023 17:34     Scheduled Meds:  acetaminophen   1,000 mg Oral Q12H   allopurinol   100 mg Oral QHS   amiodarone   100 mg Oral Daily   atorvastatin   20 mg Oral QHS   pantoprazole   40 mg Oral BID AC   Continuous Infusions:  piperacillin-tazobactam (ZOSYN)  IV 3.375 g (09/13/23 0354)     LOS: 2 days    Time spent: 35 minutes Barbee Lew, MD  09/13/2023, 1:35 PM

## 2023-09-14 DIAGNOSIS — K922 Gastrointestinal hemorrhage, unspecified: Secondary | ICD-10-CM | POA: Diagnosis not present

## 2023-09-14 LAB — TYPE AND SCREEN
ABO/RH(D): O NEG
Antibody Screen: NEGATIVE
Unit division: 0
Unit division: 0

## 2023-09-14 LAB — COMPREHENSIVE METABOLIC PANEL WITH GFR
ALT: 6 U/L (ref 0–44)
AST: 12 U/L — ABNORMAL LOW (ref 15–41)
Albumin: 2.3 g/dL — ABNORMAL LOW (ref 3.5–5.0)
Alkaline Phosphatase: 83 U/L (ref 38–126)
Anion gap: 9 (ref 5–15)
BUN: 30 mg/dL — ABNORMAL HIGH (ref 8–23)
CO2: 28 mmol/L (ref 22–32)
Calcium: 8.6 mg/dL — ABNORMAL LOW (ref 8.9–10.3)
Chloride: 102 mmol/L (ref 98–111)
Creatinine, Ser: 2.46 mg/dL — ABNORMAL HIGH (ref 0.61–1.24)
GFR, Estimated: 26 mL/min — ABNORMAL LOW (ref >60)
Glucose, Bld: 90 mg/dL (ref 70–99)
Potassium: 4 mmol/L (ref 3.5–5.1)
Sodium: 139 mmol/L (ref 135–145)
Total Bilirubin: 1 mg/dL (ref 0.0–1.2)
Total Protein: 6 g/dL — ABNORMAL LOW (ref 6.5–8.1)

## 2023-09-14 LAB — CBC
HCT: 41.4 % (ref 39.0–52.0)
Hemoglobin: 12.8 g/dL — ABNORMAL LOW (ref 13.0–17.0)
MCH: 31.6 pg (ref 26.0–34.0)
MCHC: 30.9 g/dL (ref 30.0–36.0)
MCV: 102.2 fL — ABNORMAL HIGH (ref 80.0–100.0)
Platelets: 284 10*3/uL (ref 150–400)
RBC: 4.05 MIL/uL — ABNORMAL LOW (ref 4.22–5.81)
RDW: 15.2 % (ref 11.5–15.5)
WBC: 11.5 10*3/uL — ABNORMAL HIGH (ref 4.0–10.5)
nRBC: 0 % (ref 0.0–0.2)

## 2023-09-14 LAB — BPAM RBC
Blood Product Expiration Date: 202506212359
Blood Product Expiration Date: 202506242359
ISSUE DATE / TIME: 202506131805
ISSUE DATE / TIME: 202506132158
Unit Type and Rh: 9500
Unit Type and Rh: 9500

## 2023-09-14 MED ORDER — FUROSEMIDE 20 MG PO TABS: 20.0000 mg | ORAL_TABLET | ORAL | Status: AC

## 2023-09-14 MED ORDER — AMOXICILLIN-POT CLAVULANATE 875-125 MG PO TABS: 1.0000 | ORAL_TABLET | Freq: Two times a day (BID) | ORAL | 0 refills | Status: AC

## 2023-09-14 MED ORDER — APIXABAN 5 MG PO TABS: 5.0000 mg | ORAL_TABLET | Freq: Two times a day (BID) | ORAL | Status: AC

## 2023-09-14 NOTE — Progress Notes (Signed)
 Transport arrived via [employer]. Handoff given and patient set to return to [facility]. Report called to facility. All questions answered. Patient left at 69

## 2023-09-14 NOTE — Progress Notes (Addendum)
 1438: RN attempted to call report to [facility] for pt's return. No answer. Will attempt again  1450: RN attempted 2nd call for report. No answer.

## 2023-09-14 NOTE — Evaluation (Signed)
 Physical Therapy Evaluation Patient Details Name: Bradley Stokes MRN: [id number] DOB: Jun 25, 1945 Today's Date: 09/14/2023  History of Present Illness  78 yo male presents to therapy following hospital admission on 09/11/2023 due to rectal bleeding over the past week. Pt was noted to have HgB of 5.4 requiring 2 units PRBC. Pt was also found to have diverticulitis with hx of colon ca s/p R colectomy. Pt PMH includes but is not limited to: arthritis, A-fib, CKD III, gout, PVD, HLD, and HTN.  Clinical Impression     Pt admitted with above diagnosis.  Pt currently with functional limitations due to the deficits listed below (see PT Problem List). Pt in bed when PT arrived. Pt required encouragement to participate with therapy, pt states he was not doing much at baseline. Pt is LTC at [facility]. Pt states he has been nonambulatory since 2023 due to B LE bleeding when they are down on the floor. Pt reports he finally was able to get the pain managed and is planing on d/c today back to [facility]. Pt required mod A for supine <> almost sitting pt unable to tolerate completion of transition to EOB due to increased rectal pain 8/10 and pt reporting feeling like he was sitting on a baseball. During the course of progression toward EOB R LE began to actively bleed. Pt returned to bed, pt able to assist to reposition self toward [facility]. Pt left in bed all needs in place and nursing staff made aware of pt pain report and R LE bleeding. All pt needs can be met in next venue. Pt will benefit from acute skilled PT to increase their independence and safety with mobility to allow discharge.         If plan is discharge home, recommend the following: Two people to help with walking and/or transfers;A lot of help with bathing/dressing/bathroom;Assistance with cooking/housework;Assist for transportation   Can travel by private vehicle   No    Equipment Recommendations None recommended by PT  Recommendations for Other  Services       Functional Status Assessment Patient has had a recent decline in their functional status and demonstrates the ability to make significant improvements in function in a reasonable and predictable amount of time.     Precautions / Restrictions Precautions Precautions: Fall Restrictions Weight Bearing Restrictions Per Provider Order: No      Mobility  Bed Mobility Overal bed mobility: Needs Assistance Bed Mobility: Supine to Sit, Sit to Supine     Supine to sit: HOB elevated, Used rails, Mod assist Sit to supine: Mod assist, HOB elevated, Used rails   General bed mobility comments: heavy reliance on use of hospital bed features, pt unable to tolerate full transition to EOB due to reports of pain in rectum increasing from 5/10 to 8/10. noted once B LE in dependent position R LE began to actively bleed, nursing made aware of pain report and R LE bleeding.    Transfers                   General transfer comment: NT pt unable to tolerate sitting EOB    Ambulation/Gait               General Gait Details: NT pt reports nonambulatory since 2023  Stairs            Wheelchair Mobility     Tilt Bed    Modified Rankin (Stroke Patients Only)       Balance Overall balance  assessment: Needs assistance     Sitting balance - Comments: pt unable to tolerate full upright sitting unsupported at EOB due to pain                                     Pertinent Vitals/Pain Pain Assessment Pain Assessment: 0-10 Pain Score: 8  Pain Location: rectum Pain Descriptors / Indicators: Constant, Discomfort (feels like there is a baseball in by butt) Pain Intervention(s): Limited activity within patient's tolerance, Monitored during session, Repositioned, Patient requesting pain meds-RN notified    Home Living Family/patient expects to be discharged to:: Skilled nursing facility                   Additional Comments: [facility]     Prior Function Prior Level of Function : Needs assist       Physical Assist : Mobility (physical);ADLs (physical) Mobility (physical): Bed mobility;Transfers ADLs (physical): Grooming;Bathing;Dressing;Toileting;IADLs Mobility Comments: pt reports he has been non ambulatory since 2023, pt states he has been participating with therapy at SNF and unable to progress toward ambulation due to B LE bleeding when in dependant position, pt states required A for transfers bed <> wc. pt requires A for all functional mobility tasks, BADLs and IADLs.       Extremity/Trunk Assessment        Lower Extremity Assessment Lower Extremity Assessment: Generalized weakness    Cervical / Trunk Assessment Cervical / Trunk Assessment: Normal  Communication   Communication Communication: No apparent difficulties    Cognition Arousal: Alert Behavior During Therapy: WFL for tasks assessed/performed   PT - Cognitive impairments: No apparent impairments                         Following commands: Intact       Cueing       General Comments General comments (skin integrity, edema, etc.): B LE discoloration distal, noted wounds in various stages of healing, R UE edema and B UE bruising, R LE active bleeding once in dependent position when attempting to transition to EOB    Exercises     Assessment/Plan    PT Assessment Patient needs continued PT services  PT Problem List Decreased strength;Decreased activity tolerance;Decreased mobility;Decreased balance;Pain       PT Treatment Interventions Functional mobility training;Therapeutic activities;Therapeutic exercise;Balance training;Neuromuscular re-education;Patient/family education    PT Goals (Current goals can be found in the Care Plan section)  Acute Rehab PT Goals Patient Stated Goal: to get out of the hospital PT Goal Formulation: With patient Time For Goal Achievement: 09/28/23 Potential to Achieve Goals: Good     Frequency Min 2X/week     Co-evaluation               AM-PAC PT 6 Clicks Mobility  Outcome Measure Help needed turning from your back to your side while in a flat bed without using bedrails?: Total Help needed moving from lying on your back to sitting on the side of a flat bed without using bedrails?: Total Help needed moving to and from a bed to a chair (including a wheelchair)?: Total Help needed standing up from a chair using your arms (e.g., wheelchair or bedside chair)?: Total Help needed to walk in hospital room?: Total Help needed climbing 3-5 steps with a railing? : Total 6 Click Score: 6    End of Session   Activity  Tolerance: Patient limited by pain;Other (comment) (R LE bleeding) Patient left: in bed;with call bell/phone within reach Nurse Communication: Mobility status;Other (comment);Patient requests pain meds (R LE bleeding) PT Visit Diagnosis: Unsteadiness on feet (R26.81);Other abnormalities of gait and mobility (R26.89);Muscle weakness (generalized) (M62.81);Difficulty in walking, not elsewhere classified (R26.2);Pain Pain - Right/Left:  (rectum)    Time: 1914-7829 PT Time Calculation (min) (ACUTE ONLY): 15 min   Charges:   PT Evaluation $PT Eval Low Complexity: 1 Low   PT General Charges $$ ACUTE PT VISIT: 1 Visit         Cary Clarks, PT Acute Rehab   Annalee Kiang 09/14/2023, 11:38 AM

## 2023-09-14 NOTE — TOC Transition Note (Signed)
 Transition of Care [facility]) - Discharge Note  Patient Details  Name: Bradley Stokes MRN: [id number] Date of Birth: 11-21-1945  [facility]) CM/SW Contact:  Zenon Hilda, LCSW Phone Number: 09/14/2023, 2:51 PM  Clinical Narrative: Patient is medically stable to return to [facility]. PT evaluation did not recommend rehab as patient is nonambulatory at baseline. CSW notified Logan at [facility] and confirmed patient can return today even though his LTC Medicaid is still pending. FL2 done. Discharge summary, discharge orders, FL2, and SNF transfer report faxed to facility in hub. Medical necessity form done; PTAR scheduled. Discharge packet completed. CSW notified patient transportation has been set up. RN updated. TOC signing off.  Final next level of care: Long Term Nursing Home Barriers to Discharge: Barriers Resolved  Patient Goals and CMS Choice Patient states their goals for this hospitalization and ongoing recovery are:: return to [facility].gov Compare Post Acute Care list provided to:: Patient [place] ownership interest in [facility].provided to:: Patient   Discharge Placement      Patient chooses bed at: [facility] Patient to be transferred to facility by: PTAR Patient and family notified of of transfer: 09/14/23  Discharge Plan and Services Additional resources added to the After Visit Summary for   Discharge Planning Services: CM Consult  DME Arranged: N/A DME Agency: NA  Social Drivers of Health (SDOH) Interventions SDOH Screenings   Food Insecurity: No Food Insecurity (09/12/2023)  Housing: Low Risk  (09/12/2023)  Transportation Needs: No Transportation Needs (09/12/2023)  Utilities: Not At Risk (09/12/2023)  Social Connections: Unknown (09/12/2023)  Tobacco Use: Low Risk  (09/11/2023)   Readmission Risk Interventions     No data to display

## 2023-09-14 NOTE — NC FL2 (Signed)
 [place]  MEDICAID FL2 LEVEL OF CARE FORM     IDENTIFICATION  Patient Name: Bradley Stokes Birthdate: [birth year] Sex: male Admission Date (Current Location): 09/11/2023  [facility] and [place] Number:  Producer, television/film/video and Address:  [facility],  [address] [place], [place] [address]      Provider Number: [id number]  Attending Physician Name and Address:  Barbee Lew, MD  Relative Name and Phone Number:  Floria Hurst (friend) Ph: [phone]    Current Level of Care: Hospital Recommended Level of Care: Skilled Nursing Facility Prior Approval Number:    Date Approved/Denied:   PASRR Number: [id number]  Discharge Plan: SNF    Current Diagnoses: Patient Active Problem List   Diagnosis Date Noted   Acute gastrointestinal bleeding 09/11/2023   Anemia    Cancer of left colon (HCC)    GI bleed 12/02/2020   (HFpEF) heart failure with preserved ejection fraction (HCC) 12/02/2020   Acute blood loss anemia 12/02/2020   Heart murmur previously undiagnosed 01/01/2015   Long term use of drug 09/05/2011   PAF (paroxysmal atrial fibrillation) (HCC)    Edema    Long term current use of anticoagulant therapy    Hyperlipidemia    Hypertension     Orientation RESPIRATION BLADDER Height & Weight     Self, Time, Situation, Place  Normal Continent Weight: 238 lb 8.6 oz (108.2 kg) Height:  5' 9 (175.3 cm)  BEHAVIORAL SYMPTOMS/MOOD NEUROLOGICAL BOWEL NUTRITION STATUS      Incontinent Diet (Regular)  AMBULATORY STATUS COMMUNICATION OF NEEDS Skin   Total Care Verbally Other (Comment) (Erythema: buttocks, bilateral arms; Ecchymosis: bilateral arms; Rash: bilateral legs)                       Personal Care Assistance Level of Assistance  Bathing, Feeding, Dressing Bathing Assistance: Maximum assistance Feeding assistance: Limited assistance Dressing Assistance: Maximum assistance     Functional Limitations Info  Sight, Hearing, Speech Sight Info:  Impaired Hearing Info: Adequate Speech Info: Adequate    SPECIAL CARE FACTORS FREQUENCY                       Contractures Contractures Info: Not present    Additional Factors Info  Code Status, Allergies Code Status Info: DNR Allergies Info: Tape, Wound Dressing Adhesive           Current Medications (09/14/2023):  This is the current hospital active medication list Current Facility-Administered Medications  Medication Dose Route Frequency Provider Last Rate Last Admin   acetaminophen  (TYLENOL ) tablet 1,000 mg  1,000 mg Oral Q12H Tera Fellows A, MD   1,000 mg at 09/13/23 2032   albuterol  (PROVENTIL ) (2.5 MG/3ML) 0.083% nebulizer solution 2.5 mg  2.5 mg Nebulization Q2H PRN Sabas Cradle, MD       allopurinol  (ZYLOPRIM ) tablet 100 mg  100 mg Oral QHS Thomas, Sara-Maiz A, MD   100 mg at 09/13/23 2032   amiodarone  (PACERONE ) tablet 100 mg  100 mg Oral Daily Thomas, Sara-Maiz A, MD   100 mg at 09/14/23 0941   ammonium lactate  (LAC-HYDRIN ) 12 % lotion   Topical Q2000 Barbee Lew, MD   Given at 09/13/23 2031   atorvastatin  (LIPITOR) tablet 20 mg  20 mg Oral QHS Thomas, Sara-Maiz A, MD   20 mg at 09/13/23 2032   morphine  (PF) 2 MG/ML injection 2 mg  2 mg Intravenous Q4H PRN Sabas Cradle, MD  2 mg at 09/14/23 0941   ondansetron  (ZOFRAN ) tablet 4 mg  4 mg Oral Q6H PRN Sabas Cradle, MD       Or   ondansetron  (ZOFRAN ) injection 4 mg  4 mg Intravenous Q6H PRN Sabas Cradle, MD       pantoprazole  (PROTONIX ) EC tablet 40 mg  40 mg Oral BID AC Thomas, Sara-Maiz A, MD   40 mg at 09/14/23 0758   piperacillin-tazobactam (ZOSYN) IVPB 3.375 g  3.375 g Intravenous Q8H Legge, Justin M, RPH 12.5 mL/hr at 09/14/23 1140 3.375 g at 09/14/23 1140     Discharge Medications: Please see discharge summary for a list of discharge medications.  Relevant Imaging Results:  Relevant Lab Results:   Additional Information SSN: [id number]  Zenon Hilda,  LCSW

## 2023-09-14 NOTE — Discharge Summary (Addendum)
 Physician Discharge Summary  Bradley Stokes:[id number] DOB: 05/02/1945 DOA: 09/11/2023  PCP: Annamarie Kid, MD  Admit date: 09/11/2023 Discharge date: 09/14/2023  Admitted From: nursing home Disposition: nursing home Recommendations for Outpatient Follow-up:  Follow up with PCP in 1-2 weeks Please obtain BMP/CBC in one week please monitor his renal functions 3 he needs a ct chest with contrast to follow up on the lung nodule   Home Health:no Equipment/Devices:no  Discharge Condition:stable CODE STATUS:dnr Diet recommendation: cardiac Brief/Interim Summary Patient lives in [facility].  He was brought in with complaints of rectal bleeding intermittently for the past week.  He denied nausea vomiting diarrhea constipation chest pain shortness of breath fever chills cough abdominal pain. He has a history of colon cancer, partial colectomy, chronic atrial fibrillation on anticoagulation with Eliquis .  His hemoglobin on admission was found to be 5.4 down from 12.4 approximately 6 months ago.  He had active rectal bleeding in the ED.  Patient received 2 units of packed RBC.  In the ER Eliquis  reversal was done with Andexxa.  His other medical history includes stage III CKD a, hypertension, colon cancer and chronic atrial fibrillation. His hemoglobin surprisingly went up to 12.6 from 5.4 after just 2 units.  Repeat hemoglobin was done to make sure it was true. CT abdomen-persistent trace left pleural effusion partially visualized 2.5 x 0.8 cm subpleural nodularity recommend CT chest with contrast.  Colonic diverticulosis with question developing or mild acute diverticulitis of the sigmoid colon.  Discharge Diagnoses:  Principal Problem:   Acute gastrointestinal bleeding    #1 acute GI bleed question diverticular bleed patient was admitted with complaints of rectal bleeding for the past week.  His hemoglobin on admission was 5.4.  He got 2 units of blood transfusion and went  up to 12.6.  He remains hemodynamically stable.  He was on Eliquis  for A-fib prior to admission. His hemoglobin remained stable at 13.1.  No further rectal bleed since admit. Patient was started on Zosyn for acute diverticulitis with improvement.He is aware he was supposed to have a colonoscopy a year ago but did not get it done.he is not interested in getting a colonoscopy.he tolerated a regular diet.restart eliquis  6/19   #2 abnormal CT findings of 2.5 x 0.8 cm subpleural nodularity seen on CT abdomen and recommending CT chest with contrast.  Will hold off on this for now with CKD.     #3 CKD stage IIIa creatinine  2.46 on dc.please check bmp 6/20   Avoid nephrotoxins hypotension.outpatient nephrology follow up.   #4 history of colon cancer and right colectomy followed by oncology as outpatient?  Nodule in the lung metastatic   #5 hyperlipidemia continue statin   #6 hypertension 147/64 restart home medications   #7 chronic atrial fibrillation Eliquis  on hold continue amiodarone    #8 peripheral vascular disease/chronic lower extremity venous stasis changes present on admission   Estimated body mass index is 35.23 kg/m as calculated from the following:   Height as of this encounter: 5' 9 (1.753 m).   Weight as of this encounter: 108.2 kg.  Discharge Instructions  Discharge Instructions     Diet - low sodium heart healthy   Complete by: As directed    Increase activity slowly   Complete by: As directed       Allergies as of 09/14/2023       Reactions   Tape Other (See Comments)   Paper tape ONLY   Wound Dressing Adhesive  Itching, Other (See Comments)   Redness, also        Medication List     STOP taking these medications    (feeding supplement) PROSource Plus liquid   potassium chloride  SA 20 MEQ tablet Commonly known as: KLOR-CON  M       TAKE these medications    acetaminophen  500 MG tablet Commonly known as: TYLENOL  Take 1,000 mg by mouth See admin  instructions. Take 1,000 mg by mouth twice a day and an additional 1,000 mg every eight hours as needed for pain   allopurinol  100 MG tablet Commonly known as: ZYLOPRIM  Take 100 mg by mouth at bedtime.   amiodarone  100 MG tablet Commonly known as: PACERONE  Take 100 mg by mouth daily.   ammonium lactate  12 % lotion Commonly known as: LAC-HYDRIN  Apply 1 application  topically See admin instructions. Apply to bilateral lower extremities and tops of the feet once a day (after cleansing/rinsing/drying)   amoxicillin-clavulanate 875-125 MG tablet Commonly known as: AUGMENTIN Take 1 tablet by mouth 2 (two) times daily for 4 days.   apixaban  5 MG Tabs tablet Commonly known as: ELIQUIS  Take 1 tablet (5 mg total) by mouth 2 (two) times daily. Start taking on: September 17, 2023 What changed: These instructions start on September 17, 2023. If you are unsure what to do until then, ask your doctor or other care provider.   atorvastatin  20 MG tablet Commonly known as: LIPITOR Take 20 mg by mouth at bedtime.   celecoxib 100 MG capsule Commonly known as: CELEBREX Take 100 mg by mouth daily.   cholecalciferol 25 MCG (1000 UNIT) tablet Commonly known as: VITAMIN D3 Take 2,000 Units by mouth daily.   furosemide  20 MG tablet Commonly known as: LASIX  Take 1 tablet (20 mg total) by mouth every other day. Start taking on: September 17, 2023 What changed: These instructions start on September 17, 2023. If you are unsure what to do until then, ask your doctor or other care provider.   loratadine 10 MG tablet Commonly known as: CLARITIN Take 10 mg by mouth daily.   mineral oil-hydrophilic petrolatum ointment Apply 1 Application topically See admin instructions. Apply to bilateral lower extremities and tops of the feet once a day (after cleansing/rinsing/drying)   ondansetron  4 MG tablet Commonly known as: ZOFRAN  Take 4 mg by mouth every 6 (six) hours as needed for nausea or vomiting.   pantoprazole  40 MG  tablet Commonly known as: PROTONIX  Take 1 tablet (40 mg total) by mouth daily. What changed: when to take this   Phospha 250 Neutral [id number] MG Tabs Take 1 tablet by mouth at bedtime.   Pro-Stat Liqd Take 30 mLs by mouth daily.   sennosides-docusate sodium  8.6-50 MG tablet Commonly known as: SENOKOT-S Take 1 tablet by mouth See admin instructions. Take 1 tablet by mouth at bedtime- hold if having loose stools   Vitamin B-12 5000 MCG Subl Place 5,000 mcg under the tongue daily.        Contact information for follow-up providers     [name], Laurette Pool, MD Follow up.   Specialty: Family Medicine Contact information: [address] [place] [address] [phone]              Contact information for after-discharge care     Destination     [facility] .   Service: Skilled Nursing Contact information: [address] [place]  [phone]  Allergies  Allergen Reactions   Tape Other (See Comments)    Paper tape ONLY   Wound Dressing Adhesive Itching and Other (See Comments)    Redness, also    Consultations: gi   Procedures/Studies: CT ABDOMEN PELVIS W CONTRAST Result Date: 09/11/2023 CLINICAL DATA:  Abdominal pain, acute, nonlocalized c/o rectal bleeding r/t colon cancer. No current treatment plan. Been ongoing for awhile and has medical appointment end of month. Rectal pain that feels like a softball sitting on top. Blood thinner noted. History colon cancer. EXAM: CT ABDOMEN AND PELVIS WITH CONTRAST TECHNIQUE: Multidetector CT imaging of the abdomen and pelvis was performed using the standard protocol following bolus administration of intravenous contrast. RADIATION DOSE REDUCTION: This exam was performed according to the departmental dose-optimization program which includes automated exposure control, adjustment of the mA and/or kV according to patient size and/or use of iterative  reconstruction technique. CONTRAST:  80mL OMNIPAQUE  IOHEXOL  300 MG/ML  SOLN COMPARISON:  CT abdomen pelvis 06/22/2023 FINDINGS: Lower chest: Persistent trace left pleural effusion with question partially visualized 2.5 by 0.8 cm subpleural nodularity. Coronary artery calcification. Hepatobiliary: No focal liver abnormality. No gallstones, gallbladder wall thickening, or pericholecystic fluid. No biliary dilatation. Pancreas: No focal lesion. Normal pancreatic contour. No surrounding inflammatory changes. No main pancreatic ductal dilatation. Spleen: Normal in size without focal abnormality. Adrenals/Urinary Tract: No adrenal nodule bilaterally. Bilateral kidneys enhance symmetrically. No nephroureterolithiasis bilaterally. No hydronephrosis. No hydroureter. Left parapelvic fluid density lesion likely represents a simple renal cyst. Simple renal cysts, in the absence of clinically indicated signs/symptoms, require no independent follow-up. The urinary bladder is unremarkable. On delayed imaging, there is no urothelial wall thickening and there are no filling defects in the opacified portions of the bilateral collecting systems or ureters. Stomach/Bowel: Partial colectomy. Stomach is within normal limits. No evidence of small bowel wall thickening or dilatation. Interval development of mild mid sigmoid colon bowel wall thickening with trace pericolonic and perirectal fat stranding. Redemonstration of second portion of the duodenum diverticula. Colonic diverticulosis. Appendix appears normal. Vascular/Lymphatic: No abdominal aorta or iliac aneurysm. Moderate atherosclerotic plaque of the aorta and its branches. No abdominal, pelvic, or inguinal lymphadenopathy. Reproductive: Prostate is unremarkable. Other: No intraperitoneal free fluid. No intraperitoneal free gas. No organized fluid collection. Musculoskeletal: No abdominal wall hernia or abnormality. No suspicious lytic or blastic osseous lesions. No acute displaced  fracture. Multilevel degenerative changes of the spine. IMPRESSION: 1. Persistent trace left pleural effusion with question partially visualized 2.5 by 0.8 cm subpleural nodularity. Recommend CT chest with intravenous (not CT PA) contrast for further evaluation. 2. Colonic diverticulosis with question developing or mild acute diverticulitis of the sigmoid colon. Recommend colonoscopy status post treatment and status post complete resolution of inflammatory changes to exclude an underlying lesion. 3. Partial colectomy. 4. Aortic Atherosclerosis (ICD10-I70.0) including coronary calcification. Electronically Signed   By: Morgane  Naveau M.D.   On: 09/11/2023 17:34   (Echo, Carotid, EGD, Colonoscopy, ERCP)    Subjective:  No c/o tolerating po intake Discharge Exam: Vitals:   09/13/23 2109 09/14/23 0420  BP: (!) 131/52 (!) 127/46  Pulse: 65 65  Resp: 20 18  Temp: 97.6 F (36.4 C) 97.7 F (36.5 C)  SpO2: 92% 93%   Vitals:   09/13/23 0623 09/13/23 1224 09/13/23 2109 09/14/23 0420  BP: (!) 114/56 (!) 90/57 (!) 131/52 (!) 127/46  Pulse: (!) 42 77 65 65  Resp: 16 19 20 18   Temp: 98.3 F (36.8 C) (!) 97.3 F (36.3 C) 97.6  F (36.4 C) 97.7 F (36.5 C)  TempSrc: Oral Oral Oral Oral  SpO2: 91% 95% 92% 93%  Weight:      Height:        General: Pt is alert, awake, not in acute distress Cardiovascular: RRR, S1/S2 +, no rubs, no gallops Respiratory: CTA bilaterally, no wheezing, no rhonchi Abdominal: Soft, NT, ND, bowel sounds + Extremities chronic venous stasis changes   The results of significant diagnostics from this hospitalization (including imaging, microbiology, ancillary and laboratory) are listed below for reference.     Microbiology: Recent Results (from the past 240 hours)  MRSA Next Gen by PCR, Nasal     Status: None   Collection Time: 09/12/23  5:51 AM   Specimen: Nasal Mucosa; Nasal Swab  Result Value Ref Range Status   MRSA by PCR Next Gen NOT DETECTED NOT DETECTED  Final    Comment: (NOTE) The GeneXpert MRSA Assay (FDA approved for NASAL specimens only), is one component of a comprehensive MRSA colonization surveillance program. It is not intended to diagnose MRSA infection nor to guide or monitor treatment for MRSA infections. Test performance is not FDA approved in patients less than 45 years old. Performed at [facility], [address], [place], [place] [address]      Labs: BNP (last 3 results) No results for input(s): BNP in the last 8760 hours. Basic Metabolic Panel: Recent Labs  Lab 09/11/23 1557 09/12/23 0822 09/14/23 0500  NA 141 140 139  K 4.3 3.9 4.0  CL 103 102 102  CO2 28 27 28   GLUCOSE 92 76 90  BUN 25* 24* 30*  CREATININE 1.92* 1.83* 2.46*  CALCIUM  8.7* 8.8* 8.6*   Liver Function Tests: Recent Labs  Lab 09/11/23 1557 09/12/23 0822 09/14/23 0500  AST 12* 9* 12*  ALT 5 7 6   ALKPHOS 95 96 83  BILITOT 0.6 1.1 1.0  PROT 6.5 6.6 6.0*  ALBUMIN 2.4* 2.5* 2.3*   No results for input(s): LIPASE, AMYLASE in the last 168 hours. No results for input(s): AMMONIA in the last 168 hours. CBC: Recent Labs  Lab 09/11/23 1557 09/11/23 2043 09/12/23 0157 09/12/23 0822 09/12/23 1427 09/13/23 0528 09/14/23 0500  WBC 6.7  --   --  9.4  --  11.6* 11.5*  HGB 5.4*   < > 12.6* 13.1 13.5 13.1 12.8*  HCT 19.9*  --   --  42.4  --  42.8 41.4  MCV 115.0*  --   --  99.8  --  102.1* 102.2*  PLT 161  --   --  291  --  275 284   < > = values in this interval not displayed.   Cardiac Enzymes: No results for input(s): CKTOTAL, CKMB, CKMBINDEX, TROPONINI in the last 168 hours. BNP: Invalid input(s): POCBNP CBG: No results for input(s): GLUCAP in the last 168 hours. D-Dimer No results for input(s): DDIMER in the last 72 hours. Hgb A1c No results for input(s): HGBA1C in the last 72 hours. Lipid Profile No results for input(s): CHOL, HDL, LDLCALC, TRIG, CHOLHDL, LDLDIRECT in  the last 72 hours. Thyroid  function studies No results for input(s): TSH, T4TOTAL, T3FREE, THYROIDAB in the last 72 hours.  Invalid input(s): FREET3 Anemia work up Recent Labs    09/11/23 2043  VITAMINB12 407  FERRITIN 136  TIBC 199*  IRON 54   Urinalysis    Component Value Date/Time   COLORURINE YELLOW 09/11/2023 1422   APPEARANCEUR CLEAR 09/11/2023 1422   LABSPEC  1.015 09/11/2023 1422   PHURINE 5.0 09/11/2023 1422   GLUCOSEU NEGATIVE 09/11/2023 1422   HGBUR NEGATIVE 09/11/2023 1422   BILIRUBINUR NEGATIVE 09/11/2023 1422   KETONESUR NEGATIVE 09/11/2023 1422   PROTEINUR NEGATIVE 09/11/2023 1422   NITRITE NEGATIVE 09/11/2023 1422   LEUKOCYTESUR TRACE (A) 09/11/2023 1422   Sepsis Labs Recent Labs  Lab 09/11/23 1557 09/12/23 0822 09/13/23 0528 09/14/23 0500  WBC 6.7 9.4 11.6* 11.5*   Microbiology Recent Results (from the past 240 hours)  MRSA Next Gen by PCR, Nasal     Status: None   Collection Time: 09/12/23  5:51 AM   Specimen: Nasal Mucosa; Nasal Swab  Result Value Ref Range Status   MRSA by PCR Next Gen NOT DETECTED NOT DETECTED Final    Comment: (NOTE) The GeneXpert MRSA Assay (FDA approved for NASAL specimens only), is one component of a comprehensive MRSA colonization surveillance program. It is not intended to diagnose MRSA infection nor to guide or monitor treatment for MRSA infections. Test performance is not FDA approved in patients less than 79 years old. Performed at [facility], [address] [place], [place] [address]      Time coordinating discharge: 38  minutes  SIGNED: Barbee Lew, MD  [facility] 09/14/2023, 2:05 PM

## 2023-09-14 NOTE — Plan of Care (Signed)

## 2023-09-24 NOTE — Assessment & Plan Note (Signed)
 Cancer of left colon, pT3N1M0 stage IIIB -Diagnosed in 11/2020 -he had right colectomy on 12/08/20 with Dr. Stevie. Pathology showed 5 cm tumor, margins not involved, one of 23 lymph nodes involved, and five tubular adenomas -liver MRI on 12/10/20 showed two probable liver cysts, not considered suspicious for metastatic disease. -due to his poor performance status, and stage IV kidney disease, he was not a candidate for adjuvant chemo. -continue cancer surveillance

## 2023-09-25 ENCOUNTER — Inpatient Hospital Stay: Attending: Family Medicine

## 2023-09-25 ENCOUNTER — Encounter: Payer: Self-pay | Admitting: Hematology

## 2023-09-25 ENCOUNTER — Inpatient Hospital Stay (HOSPITAL_BASED_OUTPATIENT_CLINIC_OR_DEPARTMENT_OTHER): Admitting: Hematology

## 2023-09-25 VITALS — BP 130/60 | HR 77 | Temp 98.4°F | Resp 15 | Wt 235.0 lb

## 2023-09-25 DIAGNOSIS — D649 Anemia, unspecified: Secondary | ICD-10-CM | POA: Diagnosis not present

## 2023-09-25 DIAGNOSIS — L0231 Cutaneous abscess of buttock: Secondary | ICD-10-CM | POA: Diagnosis not present

## 2023-09-25 DIAGNOSIS — R911 Solitary pulmonary nodule: Secondary | ICD-10-CM | POA: Insufficient documentation

## 2023-09-25 DIAGNOSIS — Z85038 Personal history of other malignant neoplasm of large intestine: Secondary | ICD-10-CM | POA: Insufficient documentation

## 2023-09-25 DIAGNOSIS — L0291 Cutaneous abscess, unspecified: Secondary | ICD-10-CM | POA: Diagnosis not present

## 2023-09-25 DIAGNOSIS — Z8701 Personal history of pneumonia (recurrent): Secondary | ICD-10-CM | POA: Diagnosis not present

## 2023-09-25 DIAGNOSIS — Z7901 Long term (current) use of anticoagulants: Secondary | ICD-10-CM | POA: Diagnosis not present

## 2023-09-25 DIAGNOSIS — C186 Malignant neoplasm of descending colon: Secondary | ICD-10-CM

## 2023-09-25 DIAGNOSIS — I480 Paroxysmal atrial fibrillation: Secondary | ICD-10-CM | POA: Insufficient documentation

## 2023-09-25 LAB — CBC WITH DIFFERENTIAL (CANCER CENTER ONLY)
Abs Immature Granulocytes: 0.03 10*3/uL (ref 0.00–0.07)
Basophils Absolute: 0.1 10*3/uL (ref 0.0–0.1)
Basophils Relative: 1 %
Eosinophils Absolute: 0.5 10*3/uL (ref 0.0–0.5)
Eosinophils Relative: 5 %
HCT: 44.6 % (ref 39.0–52.0)
Hemoglobin: 13.8 g/dL (ref 13.0–17.0)
Immature Granulocytes: 0 %
Lymphocytes Relative: 15 %
Lymphs Abs: 1.4 10*3/uL (ref 0.7–4.0)
MCH: 30.7 pg (ref 26.0–34.0)
MCHC: 30.9 g/dL (ref 30.0–36.0)
MCV: 99.3 fL (ref 80.0–100.0)
Monocytes Absolute: 0.7 10*3/uL (ref 0.1–1.0)
Monocytes Relative: 8 %
Neutro Abs: 6.7 10*3/uL (ref 1.7–7.7)
Neutrophils Relative %: 71 %
Platelet Count: 292 10*3/uL (ref 150–400)
RBC: 4.49 MIL/uL (ref 4.22–5.81)
RDW: 15 % (ref 11.5–15.5)
WBC Count: 9.4 10*3/uL (ref 4.0–10.5)
nRBC: 0 % (ref 0.0–0.2)

## 2023-09-25 LAB — CMP (CANCER CENTER ONLY)
ALT: <5 U/L (ref 0–44)
AST: 9 U/L — ABNORMAL LOW (ref 15–41)
Albumin: 3.4 g/dL — ABNORMAL LOW (ref 3.5–5.0)
Alkaline Phosphatase: 106 U/L (ref 38–126)
Anion gap: 7 (ref 5–15)
BUN: 24 mg/dL — ABNORMAL HIGH (ref 8–23)
CO2: 32 mmol/L (ref 22–32)
Calcium: 9.3 mg/dL (ref 8.9–10.3)
Chloride: 104 mmol/L (ref 98–111)
Creatinine: 1.82 mg/dL — ABNORMAL HIGH (ref 0.61–1.24)
GFR, Estimated: 37 mL/min — ABNORMAL LOW (ref >60)
Glucose, Bld: 87 mg/dL (ref 70–99)
Potassium: 4.6 mmol/L (ref 3.5–5.1)
Sodium: 143 mmol/L (ref 135–145)
Total Bilirubin: 0.7 mg/dL (ref 0.0–1.2)
Total Protein: 7.5 g/dL (ref 6.5–8.1)

## 2023-09-25 LAB — IRON AND IRON BINDING CAPACITY (CC-WL,HP ONLY)
Iron: 61 ug/dL (ref 45–182)
Saturation Ratios: 27 % (ref 17.9–39.5)
TIBC: 227 ug/dL — ABNORMAL LOW (ref 250–450)
UIBC: 166 ug/dL (ref 117–376)

## 2023-09-25 NOTE — Progress Notes (Signed)
 [facility]   Telephone:([phone]) [phone] Fax:([phone]) [phone]   Clinic Follow up Note   Patient Care Team: [name], Bradley SAUNDERS, MD as PCP - General (Family Medicine) Bradley Oneil BROCKS, MD as PCP - Cardiology (Cardiology) [name], MD as Consulting Physician (Gastroenterology)  Date of Service:  09/25/2023  CHIEF COMPLAINT: f/u of colon cancer  CURRENT THERAPY:  Cancer surveillance  Oncology History   Cancer of left colon [facility]) Cancer of left colon, pT3N1M0 stage IIIB -Diagnosed in 11/2020 -he had right colectomy on 12/08/20 with Dr. Stevie. Pathology showed 5 cm tumor, margins not involved, one of 23 lymph nodes involved, and five tubular adenomas -liver MRI on 12/10/20 showed two probable liver cysts, not considered suspicious for metastatic disease. -due to his poor performance status, and stage IV kidney disease, he was not a candidate for adjuvant chemo. -continue cancer surveillance   Assessment & Plan colon cancer -I reviewed his recent CT scan during his hospital stay, which did not show definitive evidence of cancer in the liver or other locations, but a lung nodule was noted. He is interested in continued cancer surveillance. - Order CT chest in mid to late July to evaluate lung nodule - Continue cancer surveillance with scans and labs  Pneumonia Recent pneumonia treated with antibiotics. Residual cough with mucus production, but no fever or shortness of breath. Crackles present on lung exam. Possible residual effects or related to lung nodule seen on CT. - Evaluate lung nodule with CT chest in mid to late July - I recommend cancer surveillance with ctDNA Signatera testing   Anemia Severe anemia required blood transfusion during recent hospitalization. Current blood count is normal at 13.8. B12 levels were previously low but are now normal, possibly due to supplementation. - Monitor blood counts and B12 levels periodically  Abscess of buttock Painful  area on the left buttock near the anus, described as raw and possibly an abscess. Bleeding noted but not continuous. Pain management is a concern. - Advise follow-up with nursing home physician for pain management and further evaluation of the abscess  Atrial fibrillation Chronic atrial fibrillation managed with Eliquis . No acute issues discussed during the visit. - Continue current medications including Eliquis   Plan - I reviewed his recent hospital records, including lab and CT scan images, and discussed the findings with patient - Follow-up in a month with CT chest without contrast a week before - Will obtain ctDNA Signatera next week   SUMMARY OF ONCOLOGIC HISTORY: Oncology History Overview Note  Cancer Staging Cancer of left colon [facility]) Staging form: Colon and Rectum, AJCC 8th Edition - Pathologic stage from 12/08/2020: Stage IIIB (pT3, pN1a, cM0) - Signed by Lanny Callander, MD on 12/11/2020    Cancer of left colon (HCC)  12/05/2020 Procedure   Colonoscopy, under Dr. Kristie  Impression: - Preparation of the colon was extremely poor inspite of aggressive lavage. - Diverticulosis in the sigmoid colon. - Likely malignant 5 cm tumor in the proximal descending colon-biopsied; mucosa 5 cm distal to the mass was injected with 3 cc's of [place] ink. - One 10 mm polyp in the cecum, removed with a hot snare x 1; resected and retrieved. - Moderate sized internal hemorrhoids.   12/05/2020 Pathology Results   FINAL MICROSCOPIC DIAGNOSIS:   A. COLON, CECAL, POLYPECTOMY:  - Tubular adenoma  - Negative for high-grade dysplasia or malignancy   B. COLON MASS, DESCENDING, BIOPSY:  - Invasive moderately differentiated adenocarcinoma, see comment    ADDENDUM: Mismatch Repair Protein (  [facility])  SUMMARY INTERPRETATION: NORMAL    12/06/2020 Imaging   CT CAP  IMPRESSION: 1. Short segment of colonic wall thickening involving the splenic flexure likely reflects patient's presumed colonic malignancy. 2.  Single 1.3 cm lymph node posterior to the colonic lesion likely reflecting local nodal involvement. No additional pathologically enlarged abdominal or pelvic lymph nodes to suggest distant nodal metastases. 3. Two subcentimeter hypodense hepatic lesions, incompletely characterized on this examination. Possibly reflecting benign hepatic cysts but cystic hepatic metastases not excluded on this study. Consider attention on close interval follow-up imaging versus further evaluation with hepatic protocol MRI with and without contrast, preferably as an outpatient when patient is stable and able to follow commands. 4. Healing nonpathologic left lateral eighth and ninth rib fractures. 5. Aortic Atherosclerosis (ICD10-I70.0).   12/08/2020 Cancer Staging   Staging form: Colon and Rectum, AJCC 8th Edition - Pathologic stage from 12/08/2020: Stage IIIB (pT3, pN1a, cM0) - Signed by Lanny Callander, MD on 12/11/2020 Stage prefix: Initial diagnosis Total positive nodes: 1 Histologic grading system: 4 grade system Histologic grade (G): G2 Residual tumor (R): R0 - None   12/08/2020 Pathology Results   FINAL MICROSCOPIC DIAGNOSIS:   A. COLON, RIGHT TRANSVERSE, COLECTOMY:  - Invasive moderately differentiated colorectal adenocarcinoma, 5 cm.  - Margins not involved.  - Metastatic carcinoma in one of twenty-three lymph nodes (1/23).  - Five tubular adenomas.  - Benign appendix.  - See oncology table.   ADDENDUM:  Mismatch Repair Protein (I[facility])  SUMMARY INTERPRETATION: NORMAL    12/10/2020 Initial Diagnosis   Cancer of left colon (HCC)   05/28/2021 Imaging   EXAM: CT CHEST, ABDOMEN AND PELVIS WITHOUT CONTRAST  IMPRESSION: 1. Prior right hemicolectomy with ileocolonic anastomosis. Prominent lymph nodes and stranding in the mesentery extending to the ileocolonic anastomosis in the left upper quadrant, likely reflecting postoperative change/inflammation. Close attention on follow-up studies is  suggested. 2. No convincing evidence of residual/metastatic disease in the chest, abdomen or pelvis. 3. Short segment colonic wall thickening of a portion of decompressed sigmoid colon is new from prior and favored to reflect underdistention. Attention on follow-up studies is recommended. 4. Punctate nonobstructive right lower pole renal stone. 5.  Aortic Atherosclerosis (ICD10-I70.0).      Discussed the use of AI scribe software for clinical note transcription with the patient, who gave verbal consent to proceed.  History of Present Illness Bradley Stokes is an 78 year old male with metastatic colon cancer who presents for follow-up.  He was recently hospitalized for severe anemia and received a blood transfusion. A CT scan during this hospitalization did not show cancer but suggested diverticulitis. He declined a recommended colonoscopy.  He experiences anal pain, particularly during bowel movements and cleaning, and has a small area on the inside of his left buttock described as an abscess.  He had a recent episode of pneumonia treated with antibiotics and experiences occasional cough with mucus, especially in the mornings. No fever or shortness of breath. He was given oxygen during his hospital stay but does not use it regularly.  He takes multiple medications including Tylenol , albuterol , amiodarone , Eliquis , Celebrex, vitamin D, Lasix , Vytorin, Lorzone, Protonix , [place], and vitamin B12 at a dose of 5000 mcg. He estimates taking around twelve to fourteen medications, mostly vitamins.     All other systems were reviewed with the patient and are negative.  MEDICAL HISTORY:  Past Medical History:  Diagnosis Date   Arthritis    severe arthritis and deformity of his knee's  Chest pain    Chronic atrial fibrillation (HCC)    Chronic renal disease, stage III (HCC)    Chronic venous stasis dermatitis    colon ca 10/2020   Edema    Gout    History of cardiovascular stress test  01/17/2008   EF- 62%   Hypercholesterolemia    Hyperlipidemia    Hypertension    Kidney stones    Long term (current) use of anticoagulants    Long-term (current) use of anticoagulants    Obesity    Osteoarthritis    PAF (paroxysmal atrial fibrillation) (HCC)    Paroxysmal atrial fibrillation (HCC)    Secondary hypercoagulable state (HCC)     SURGICAL HISTORY: Past Surgical History:  Procedure Laterality Date   BIOPSY  12/05/2020   Procedure: BIOPSY;  Surgeon: [name], MD;  Location: [facility] ENDOSCOPY;  Service: Endoscopy;;   CARDIOLITE STRESS TEST     EF 62%   COLONOSCOPY N/A 12/05/2020   Procedure: COLONOSCOPY;  Surgeon: [name], MD;  Location: [facility] ENDOSCOPY;  Service: Endoscopy;  Laterality: N/A;   ESOPHAGOGASTRODUODENOSCOPY (EGD) WITH PROPOFOL  N/A 12/05/2020   Procedure: ESOPHAGOGASTRODUODENOSCOPY (EGD) WITH PROPOFOL ;  Surgeon: [name], MD;  Location: [facility] ENDOSCOPY;  Service: Endoscopy;  Laterality: N/A;   LAPAROSCOPIC LOW ANTERIOR RESCECTION WITH COLOANAL ANASTOMOSIS  12/08/2020   Procedure: LAPAROSCOPIC LOW ANTERIOR RESCECTION WITH COLOANAL ANASTOMOSIS;  Surgeon: Stevie, Herlene Righter, MD;  Location: [facility] OR;  Service: General;;   LAPAROSCOPIC PARTIAL COLECTOMY N/A 12/08/2020   Procedure: LAPAROSCOPIC ASSISTED POSSIBLE OPEN PARTIAL COLECTOMY WITH POSSIBLE OSTOMY;  Surgeon: Stevie, Herlene Righter, MD;  Location: [facility] OR;  Service: General;  Laterality: N/A;   POLYPECTOMY  12/05/2020   Procedure: POLYPECTOMY;  Surgeon: [name], MD;  Location: [facility] ENDOSCOPY;  Service: Endoscopy;;   SUBMUCOSAL TATTOO INJECTION  12/05/2020   Procedure: SUBMUCOSAL TATTOO INJECTION;  Surgeon: [name], MD;  Location: [facility] ENDOSCOPY;  Service: Endoscopy;;    I have reviewed the social history and family history with the patient and they are unchanged from previous note.  ALLERGIES:  is allergic to tape and wound dressing adhesive.  MEDICATIONS:  Current Outpatient Medications  Medication Sig  Dispense Refill   acetaminophen  (TYLENOL ) 500 MG tablet Take 1,000 mg by mouth See admin instructions. Take 1,000 mg by mouth twice a day and an additional 1,000 mg every eight hours as needed for pain     allopurinol  (ZYLOPRIM ) 100 MG tablet Take 100 mg by mouth at bedtime.     Amino Acids-Protein Hydrolys (PRO-STAT) LIQD Take 30 mLs by mouth daily.     amiodarone  (PACERONE ) 100 MG tablet Take 100 mg by mouth daily.     ammonium lactate  (LAC-HYDRIN ) 12 % lotion Apply 1 application  topically See admin instructions. Apply to bilateral lower extremities and tops of the feet once a day (after cleansing/rinsing/drying)     apixaban  (ELIQUIS ) 5 MG TABS tablet Take 1 tablet (5 mg total) by mouth 2 (two) times daily.     atorvastatin  (LIPITOR) 20 MG tablet Take 20 mg by mouth at bedtime.     celecoxib (CELEBREX) 100 MG capsule Take 100 mg by mouth daily.     cholecalciferol (VITAMIN D3) 25 MCG (1000 UNIT) tablet Take 2,000 Units by mouth daily.     Cyanocobalamin  (VITAMIN B-12) 5000 MCG SUBL Place 5,000 mcg under the tongue daily.     furosemide  (LASIX ) 20 MG tablet Take 1 tablet (20 mg total) by mouth every other day.  K Phos  Mono-Sod Phos Di & Mono (PHOSPHA 250 NEUTRAL) 155-852-130 MG TABS Take 1 tablet by mouth at bedtime.     loratadine (CLARITIN) 10 MG tablet Take 10 mg by mouth daily.     mineral oil-hydrophilic petrolatum (AQUAPHOR) ointment Apply 1 Application topically See admin instructions. Apply to bilateral lower extremities and tops of the feet once a day (after cleansing/rinsing/drying)     ondansetron  (ZOFRAN ) 4 MG tablet Take 4 mg by mouth every 6 (six) hours as needed for nausea or vomiting.     pantoprazole  (PROTONIX ) 40 MG tablet Take 1 tablet (40 mg total) by mouth daily. (Patient taking differently: Take 40 mg by mouth 2 (two) times daily before a meal.)     sennosides-docusate sodium  (SENOKOT-S) 8.6-50 MG tablet Take 1 tablet by mouth See admin instructions. Take 1 tablet by  mouth at bedtime- hold if having loose stools     No current facility-administered medications for this visit.    PHYSICAL EXAMINATION: ECOG PERFORMANCE STATUS: 2 - Symptomatic, <50% confined to bed  Vitals:   09/25/23 1050  BP: 130/60  Pulse: 77  Resp: 15  Temp: 98.4 F (36.9 C)  SpO2: 97%   Wt Readings from Last 3 Encounters:  09/25/23 235 lb (106.6 kg)  09/12/23 238 lb 8.6 oz (108.2 kg)  12/13/20 291 lb 10.7 oz (132.3 kg)     GENERAL:alert, no distress and comfortable SKIN: skin color, texture, turgor are normal, no rashes or significant lesions EYES: normal, Conjunctiva are pink and non-injected, sclera clear NECK: supple, thyroid  normal size, non-tender, without nodularity LYMPH:  no palpable lymphadenopathy in the cervical, axillary  LUNGS: clear to auscultation and percussion with normal breathing effort HEART: regular rate & rhythm and no murmurs and no lower extremity edema ABDOMEN:abdomen soft, non-tender and normal bowel sounds Musculoskeletal:no cyanosis of digits and no clubbing  NEURO: alert & oriented x 3 with fluent speech, no focal motor/sensory deficits  Physical Exam CHEST: Crackles present in the posterior lung fields.  LABORATORY DATA:  I have reviewed the data as listed    Latest Ref Rng & Units 09/25/2023   10:40 AM 09/14/2023    5:00 AM 09/13/2023    5:28 AM  CBC  WBC 4.0 - 10.5 K/uL 9.4  11.5  11.6   Hemoglobin 13.0 - 17.0 g/dL 86.1  87.1  86.8   Hematocrit 39.0 - 52.0 % 44.6  41.4  42.8   Platelets 150 - 400 K/uL 292  284  275         Latest Ref Rng & Units 09/25/2023   10:40 AM 09/14/2023    5:00 AM 09/12/2023    8:22 AM  CMP  Glucose 70 - 99 mg/dL 87  90  76   BUN 8 - 23 mg/dL 24  30  24    Creatinine 0.61 - 1.24 mg/dL 8.17  7.53  8.16   Sodium 135 - 145 mmol/L 143  139  140   Potassium 3.5 - 5.1 mmol/L 4.6  4.0  3.9   Chloride 98 - 111 mmol/L 104  102  102   CO2 22 - 32 mmol/L 32  28  27   Calcium  8.9 - 10.3 mg/dL 9.3  8.6  8.8    Total Protein 6.5 - 8.1 g/dL 7.5  6.0  6.6   Total Bilirubin 0.0 - 1.2 mg/dL 0.7  1.0  1.1   Alkaline Phos 38 - 126 U/L 106  83  96   AST 15 -  41 U/L 9  12  9    ALT 0 - 44 U/L 5  6  7        RADIOGRAPHIC STUDIES: I have personally reviewed the radiological images as listed and agreed with the findings in the report. No results found.    Orders Placed This Encounter  Procedures   CT Chest Wo Contrast    Standing Status:   Future    Expected Date:   10/25/2023    Expiration Date:   09/24/2024    Preferred imaging location?:   [facility]   CBC with Differential/Platelet    Standing Status:   Standing    Number of Occurrences:   50    Expiration Date:   09/24/2024   CEA (Access)    Standing Status:   Standing    Number of Occurrences:   30    Expiration Date:   09/24/2024   Comprehensive metabolic panel with GFR    Standing Status:   Standing    Number of Occurrences:   50    Expiration Date:   09/24/2024   Ferritin    Standing Status:   Standing    Number of Occurrences:   20    Expiration Date:   09/24/2024   Vitamin B12    Standing Status:   Standing    Number of Occurrences:   10    Expiration Date:   09/24/2024   All questions were answered. The patient knows to call the clinic with any problems, questions or concerns. No barriers to learning was detected. The total time spent in the appointment was 30 minutes, including review of chart and various tests results, discussions about plan of care and coordination of care plan     Onita Mattock, MD 09/25/2023

## 2023-09-28 ENCOUNTER — Other Ambulatory Visit: Payer: Self-pay

## 2023-09-28 ENCOUNTER — Telehealth: Payer: Self-pay | Admitting: Hematology

## 2023-09-28 NOTE — Telephone Encounter (Signed)
 Scheduled appointments per 6/27 los. Called and left a VM with appointment details for the patient.

## 2023-10-26 ENCOUNTER — Other Ambulatory Visit

## 2023-10-27 ENCOUNTER — Inpatient Hospital Stay: Attending: Family Medicine

## 2023-10-27 ENCOUNTER — Ambulatory Visit (HOSPITAL_COMMUNITY)
Admission: RE | Admit: 2023-10-27 | Discharge: 2023-10-27 | Disposition: A | Discharge status: Home / Self Care | Source: Ambulatory Visit | Attending: Hematology | Admitting: Hematology

## 2023-10-27 DIAGNOSIS — C186 Malignant neoplasm of descending colon: Secondary | ICD-10-CM

## 2023-10-27 DIAGNOSIS — Z85038 Personal history of other malignant neoplasm of large intestine: Secondary | ICD-10-CM | POA: Diagnosis present

## 2023-10-27 DIAGNOSIS — D649 Anemia, unspecified: Secondary | ICD-10-CM | POA: Insufficient documentation

## 2023-10-27 LAB — COMPREHENSIVE METABOLIC PANEL WITH GFR
ALT: <5 U/L (ref 0–44)
AST: 11 U/L — ABNORMAL LOW (ref 15–41)
Albumin: 3.3 g/dL — ABNORMAL LOW (ref 3.5–5.0)
Alkaline Phosphatase: 133 U/L — ABNORMAL HIGH (ref 38–126)
Anion gap: 10 (ref 5–15)
BUN: 32 mg/dL — ABNORMAL HIGH (ref 8–23)
CO2: 30 mmol/L (ref 22–32)
Calcium: 9.5 mg/dL (ref 8.9–10.3)
Chloride: 100 mmol/L (ref 98–111)
Creatinine, Ser: 1.73 mg/dL — ABNORMAL HIGH (ref 0.61–1.24)
GFR, Estimated: 39 mL/min — ABNORMAL LOW (ref >60)
Glucose, Bld: 79 mg/dL (ref 70–99)
Potassium: 4.7 mmol/L (ref 3.5–5.1)
Sodium: 140 mmol/L (ref 135–145)
Total Bilirubin: 0.7 mg/dL (ref 0.0–1.2)
Total Protein: 7.6 g/dL (ref 6.5–8.1)

## 2023-10-27 LAB — CBC WITH DIFFERENTIAL/PLATELET
Abs Immature Granulocytes: 0.03 K/uL (ref 0.00–0.07)
Basophils Absolute: 0.1 K/uL (ref 0.0–0.1)
Basophils Relative: 1 %
Eosinophils Absolute: 0.4 K/uL (ref 0.0–0.5)
Eosinophils Relative: 4 %
HCT: 44.5 % (ref 39.0–52.0)
Hemoglobin: 13.8 g/dL (ref 13.0–17.0)
Immature Granulocytes: 0 %
Lymphocytes Relative: 14 %
Lymphs Abs: 1.5 K/uL (ref 0.7–4.0)
MCH: 30.9 pg (ref 26.0–34.0)
MCHC: 31 g/dL (ref 30.0–36.0)
MCV: 99.6 fL (ref 80.0–100.0)
Monocytes Absolute: 0.7 K/uL (ref 0.1–1.0)
Monocytes Relative: 7 %
Neutro Abs: 7.7 K/uL (ref 1.7–7.7)
Neutrophils Relative %: 74 %
Platelets: 314 K/uL (ref 150–400)
RBC: 4.47 MIL/uL (ref 4.22–5.81)
RDW: 15.1 % (ref 11.5–15.5)
WBC: 10.3 K/uL (ref 4.0–10.5)
nRBC: 0 % (ref 0.0–0.2)

## 2023-10-27 LAB — CEA (ACCESS): CEA (CHCC): 11.55 ng/mL — ABNORMAL HIGH (ref 0.00–5.00)

## 2023-10-27 LAB — FERRITIN: Ferritin: 566 ng/mL — ABNORMAL HIGH (ref 24–336)

## 2023-10-27 LAB — VITAMIN B12: Vitamin B-12: 1071 pg/mL — ABNORMAL HIGH (ref 180–914)

## 2023-11-02 ENCOUNTER — Other Ambulatory Visit

## 2023-11-02 ENCOUNTER — Inpatient Hospital Stay: Attending: Family Medicine | Admitting: Hematology

## 2023-11-02 ENCOUNTER — Other Ambulatory Visit: Payer: Self-pay

## 2023-11-02 ENCOUNTER — Telehealth: Payer: Self-pay

## 2023-11-02 NOTE — Telephone Encounter (Signed)
 Patient did not arrive to his appointments today. Reached out to the facility where he is currently staying to inquire patient's whereabouts, @T [phone].  Was transferred twice - was advised that the patient is okay - call was transferred to the nurse's station. Left detailed vmail on the nursing director's voicemail requesting a callback. This medical assistant called the facility back to try to speak with a live person. Was transferred to the director of transportation and had to leave another vmail. This medical assistant called the facility back to try to reach a live person that could clarify why the patient did not arrive to his appointment today.  Spoke with [facility] whom states patient has been refusing to go to his appointments. Tammy stated she would figure out exactly why patient did not come to his appointments today and call me back directly @T #[phone] ext [phone].

## 2023-11-02 NOTE — Assessment & Plan Note (Deleted)
 Cancer of left colon, pT3N1M0 stage IIIB -Diagnosed in 11/2020 -he had right colectomy on 12/08/20 with Dr. Stevie. Pathology showed 5 cm tumor, margins not involved, one of 23 lymph nodes involved, and five tubular adenomas -liver MRI on 12/10/20 showed two probable liver cysts, not considered suspicious for metastatic disease. -due to his poor performance status, and stage IV kidney disease, he was not a candidate for adjuvant chemo. -CT AP in JUne 2025 showed a 2.5cm left lung nodule  -CT chest 10/27/2023 showed

## 2023-11-03 ENCOUNTER — Other Ambulatory Visit: Payer: Self-pay

## 2023-11-03 ENCOUNTER — Telehealth: Payer: Self-pay

## 2023-11-03 NOTE — Telephone Encounter (Signed)
 Received telephone call from Adria Dais the scheduler and transportation coordinator at [facility]. Caller calling to reschedule appointments patient missed yesterday. Transferred call to scheduler. Documented Tangela's contact info in the patient's chart for future references.

## 2023-11-30 IMAGING — CT CT CHEST-ABD-PELV W/O CM
2 of 4 series · 14 of 36 positions shown, 16 images · non-contrast
Comparison: CT December 06, 2020 and MRI December 10, 2020

CLINICAL DATA: History of colorectal cancer, surveillance.



[Series 2: cap w/o · axial · non-contrast · 0.98mm/px · z∈[-669,-134]mm · 11 of 129 slices shown, 13 images]
[im 11/129  mediastinal]
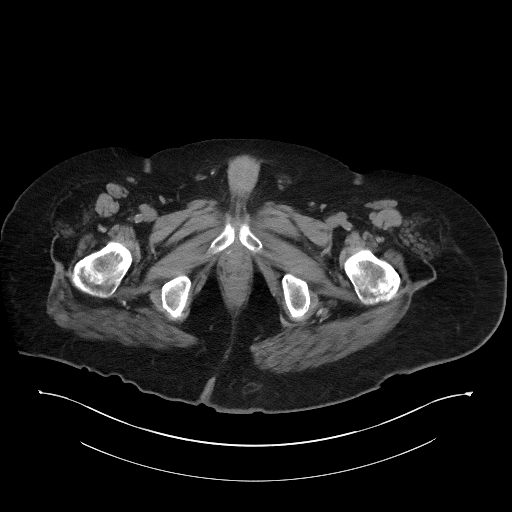
[im 11/129  bone]
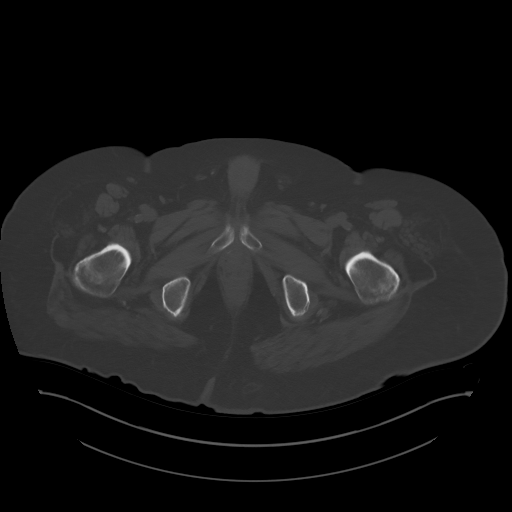
[im 22/129  mediastinal]
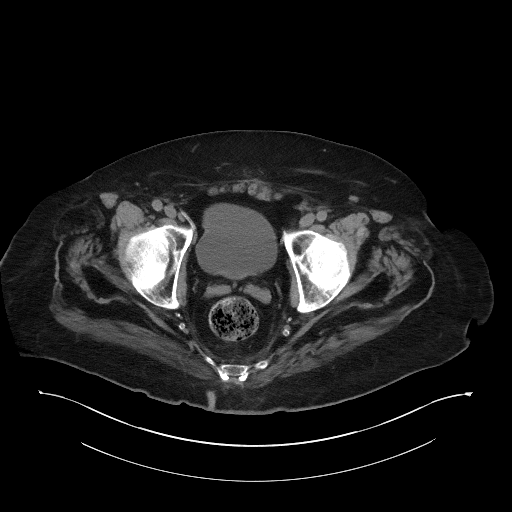
[im 33/129  mediastinal]
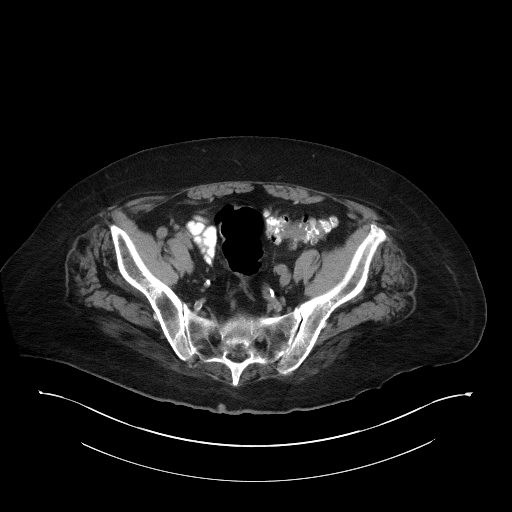
[im 43/129  mediastinal]
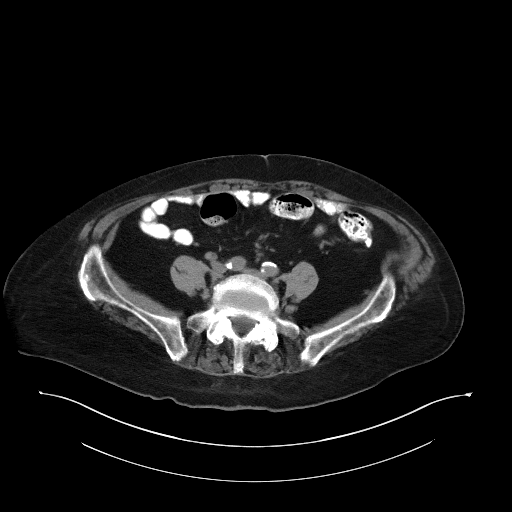
[im 54/129  mediastinal]
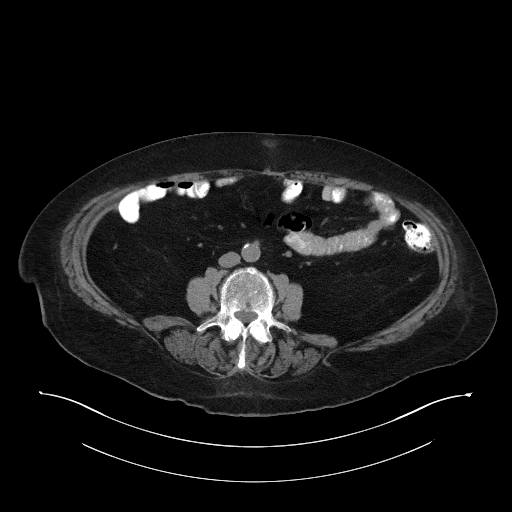
[im 65/129  mediastinal]
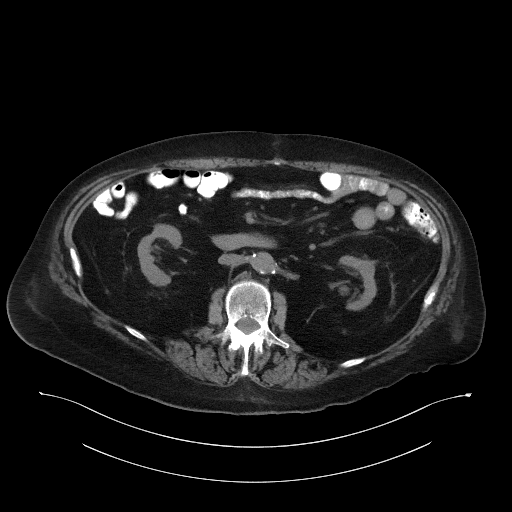
[im 75/129  mediastinal]
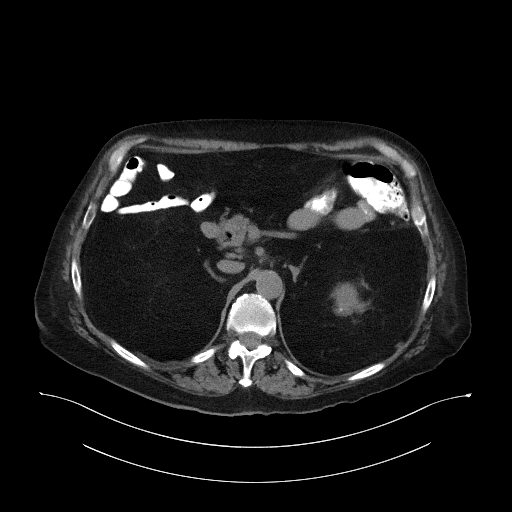
[im 86/129  mediastinal]
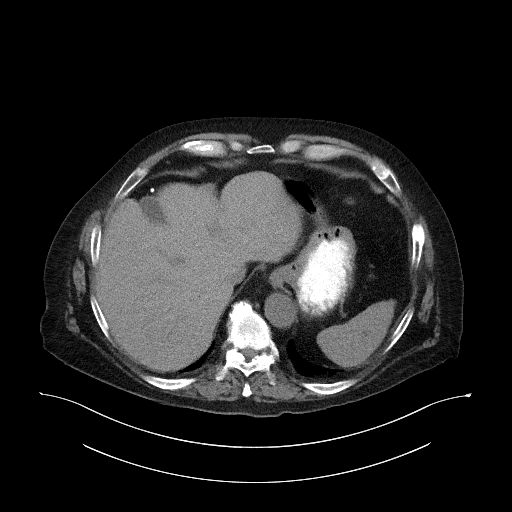
[im 97/129  mediastinal]
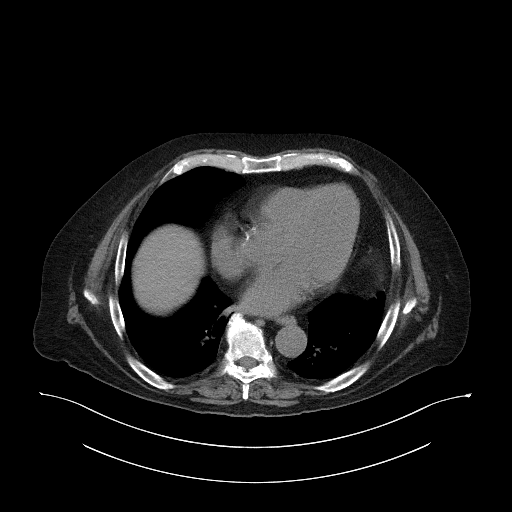
[im 97/129  bone]
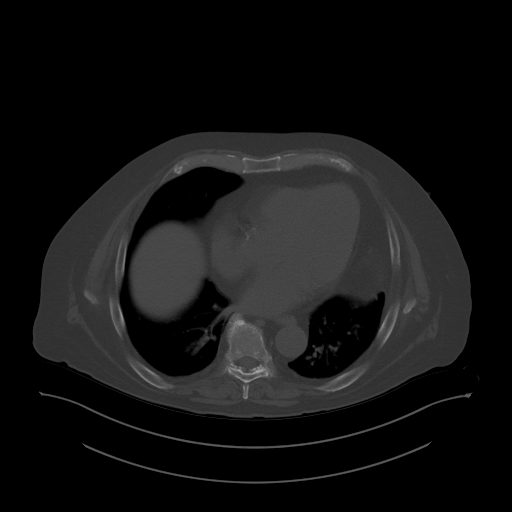
[im 107/129  mediastinal]
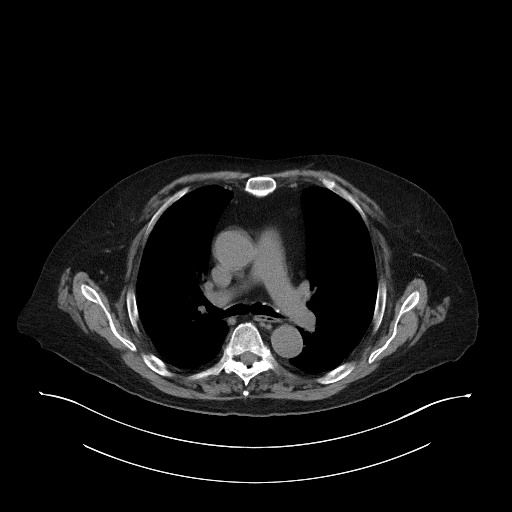
[im 118/129  mediastinal]
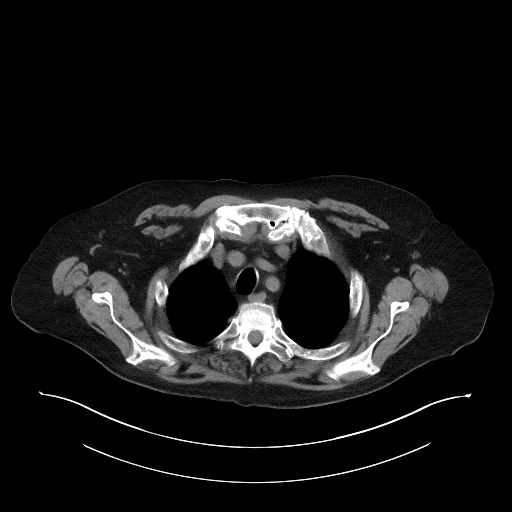

[Series 5: coronals · coronal · 0.98mm/px · 3 of 150 slices shown]
[im 30/150  mediastinal]
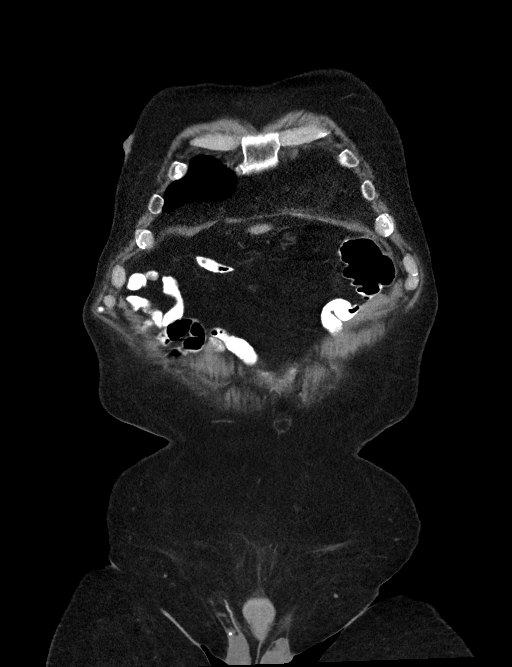
[im 60/150  mediastinal]
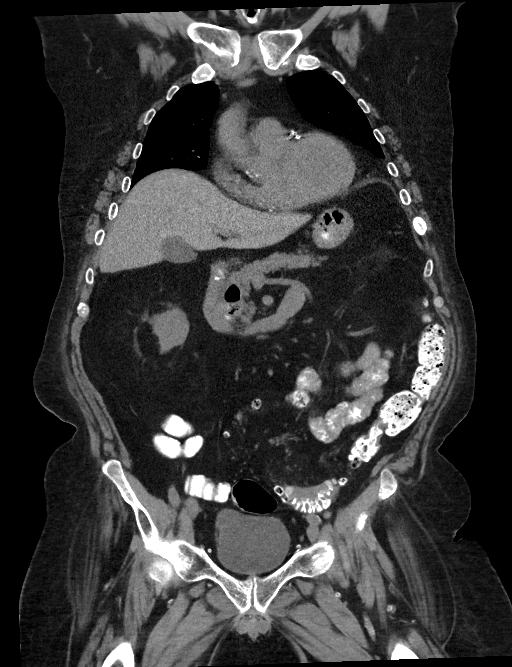
[im 90/150  mediastinal]
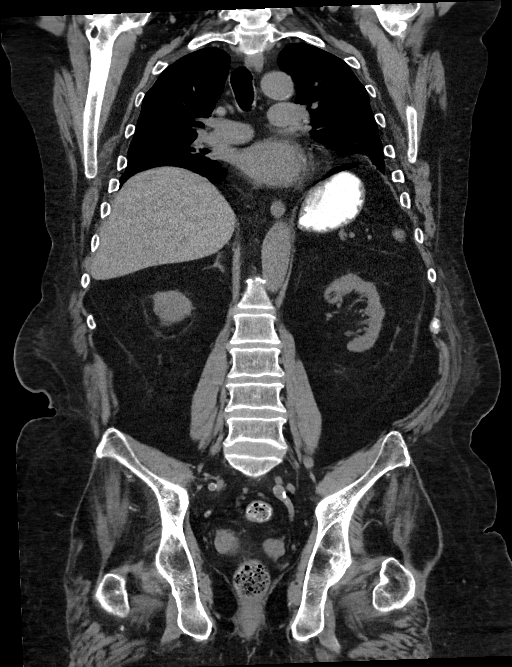

[14 of 36 positions shown; findings below may reference images not displayed]

FINDINGS: CT CHEST FINDINGS

Cardiovascular: Aortic atherosclerosis without aneurysmal dilation.
Normal caliber central pulmonary arteries. Three-vessel coronary
artery calcifications. Normal size heart. No significant pericardial
effusion/thickening.

Mediastinum/Nodes: No supraclavicular adenopathy. No pathologically
enlarged mediastinal, hilar or axillary lymph nodes, noting limited
sensitivity for the detection of hilar adenopathy on this
noncontrast study. The trachea and esophagus are unremarkable.

Lungs/Pleura: Hypoventilatory change in the dependent lungs. No
suspicious pulmonary nodules or masses. No pleural effusion. No
pneumothorax.

Musculoskeletal: Healing left lateral eighth and ninth rib
fractures. No aggressive lytic or blastic lesion of bone. Bilateral
glenohumeral degenerative change. Thoracic spondylosis.

CT ABDOMEN PELVIS FINDINGS

Hepatobiliary: Subcentimeter hypodense hepatic lesions are stable
from prior and previously characterized as benign cysts on MRI
December 10, 2020. The gallbladder is without wall thickening or
other signs of inflammation. No biliary ductal dilation.

Pancreas: No pancreatic ductal dilation or evidence of acute
inflammation.

Spleen: No splenomegaly or focal splenic lesion.

Adrenals/Urinary Tract: Bilateral adrenal glands appear normal. No
hydronephrosis. Bilateral renal cortical thinning. Punctate
nonobstructive right lower pole renal stone. Urinary bladder is
unremarkable for degree of distension.

Stomach/Bowel: Radiopaque enteric contrast material traverses the
rectum. Stomach is unremarkable for degree of distension.
Periampullary duodenal diverticulum. No pathologic dilation of small
or large bowel. Prior right hemicolectomy with ileocolonic
anastomosis. Prominent lymph nodes and stranding in the mesentery
extending to the ileocolonic anastomosis in the left upper quadrant
for instance on images 59-63/2. Colonic diverticulosis. Short
segment colonic wall thickening of the sigmoid colon on image 97/2
is new from prior and favored to reflect underdistention.

Vascular/Lymphatic: Aortic and branch vessel atherosclerosis without
abdominal aortic aneurysm. No pathologically enlarged abdominal or
pelvic lymph nodes.

Reproductive: Prostate is unremarkable.

Other: No significant abdominopelvic free fluid. No walled off fluid
collections.

Musculoskeletal: Multilevel degenerative changes spine. No
aggressive lytic or blastic lesion of bone.
IMPRESSION: 1. Prior right hemicolectomy with ileocolonic anastomosis. Prominent
lymph nodes and stranding in the mesentery extending to the
ileocolonic anastomosis in the left upper quadrant, likely
reflecting postoperative change/inflammation. Close attention on
follow-up studies is suggested.
2. No convincing evidence of residual/metastatic disease in the
chest, abdomen or pelvis.
3. Short segment colonic wall thickening of a portion of
decompressed sigmoid colon is new from prior and favored to reflect
underdistention. Attention on follow-up studies is recommended.
4. Punctate nonobstructive right lower pole renal stone.
5.  Aortic Atherosclerosis ([id number]).

## 2023-12-01 ENCOUNTER — Encounter (HOSPITAL_BASED_OUTPATIENT_CLINIC_OR_DEPARTMENT_OTHER): Attending: Internal Medicine | Admitting: Internal Medicine

## 2023-12-01 DIAGNOSIS — C186 Malignant neoplasm of descending colon: Secondary | ICD-10-CM | POA: Diagnosis not present

## 2023-12-01 DIAGNOSIS — L89313 Pressure ulcer of right buttock, stage 3: Secondary | ICD-10-CM | POA: Insufficient documentation

## 2023-12-01 DIAGNOSIS — L89323 Pressure ulcer of left buttock, stage 3: Secondary | ICD-10-CM | POA: Diagnosis not present

## 2023-12-15 ENCOUNTER — Encounter (HOSPITAL_BASED_OUTPATIENT_CLINIC_OR_DEPARTMENT_OTHER): Admitting: Internal Medicine
